# Patient Record
Sex: Female | Born: 1935 | Race: White | Hispanic: No | Smoking: Current every day smoker
Health system: Southern US, Community
[De-identification: ages and names within clinical notes are randomized; demographics above are authoritative.]

## PROBLEM LIST (undated history)

## (undated) ENCOUNTER — Ambulatory Visit: Admission: EM | Source: Home / Self Care

## (undated) DIAGNOSIS — H269 Unspecified cataract: Secondary | ICD-10-CM

## (undated) DIAGNOSIS — Z923 Personal history of irradiation: Secondary | ICD-10-CM

## (undated) DIAGNOSIS — C50919 Malignant neoplasm of unspecified site of unspecified female breast: Secondary | ICD-10-CM

## (undated) DIAGNOSIS — E7800 Pure hypercholesterolemia, unspecified: Secondary | ICD-10-CM

## (undated) DIAGNOSIS — C50912 Malignant neoplasm of unspecified site of left female breast: Secondary | ICD-10-CM

## (undated) DIAGNOSIS — Z8739 Personal history of other diseases of the musculoskeletal system and connective tissue: Secondary | ICD-10-CM

## (undated) DIAGNOSIS — I10 Essential (primary) hypertension: Secondary | ICD-10-CM

## (undated) DIAGNOSIS — I219 Acute myocardial infarction, unspecified: Secondary | ICD-10-CM

## (undated) DIAGNOSIS — D649 Anemia, unspecified: Secondary | ICD-10-CM

## (undated) DIAGNOSIS — K219 Gastro-esophageal reflux disease without esophagitis: Secondary | ICD-10-CM

## (undated) DIAGNOSIS — C50312 Malignant neoplasm of lower-inner quadrant of left female breast: Secondary | ICD-10-CM

## (undated) HISTORY — PX: ABDOMINAL HYSTERECTOMY: SHX81

## (undated) HISTORY — DX: Unspecified cataract: H26.9

## (undated) HISTORY — DX: Malignant neoplasm of lower-inner quadrant of left female breast: C50.312

## (undated) HISTORY — PX: CORONARY ANGIOPLASTY: SHX604

## (undated) HISTORY — PX: PORTACATH PLACEMENT: SHX2246

## (undated) HISTORY — PX: BREAST SURGERY: SHX581

## (undated) HISTORY — PX: PORT-A-CATH REMOVAL: SHX5289

## (undated) HISTORY — DX: Gastro-esophageal reflux disease without esophagitis: K21.9

## (undated) HISTORY — DX: Malignant neoplasm of unspecified site of unspecified female breast: C50.919

## (undated) HISTORY — DX: Malignant neoplasm of unspecified site of left female breast: C50.912

## (undated) HISTORY — PX: MASTECTOMY, PARTIAL: SHX709

## (undated) HISTORY — PX: PERCUTANEOUS STENT INTERVENTION: SHX6019

---

## 2000-06-01 ENCOUNTER — Ambulatory Visit (HOSPITAL_COMMUNITY): Admission: RE | Admit: 2000-06-01 | Discharge: 2000-06-01 | Payer: Self-pay | Admitting: Otolaryngology

## 2000-06-01 ENCOUNTER — Encounter: Payer: Self-pay | Admitting: Internal Medicine

## 2001-01-13 DIAGNOSIS — I219 Acute myocardial infarction, unspecified: Secondary | ICD-10-CM

## 2001-01-13 HISTORY — DX: Acute myocardial infarction, unspecified: I21.9

## 2001-02-11 ENCOUNTER — Ambulatory Visit (HOSPITAL_COMMUNITY): Admission: RE | Admit: 2001-02-11 | Discharge: 2001-02-11 | Payer: Self-pay | Admitting: Internal Medicine

## 2001-02-11 ENCOUNTER — Encounter: Payer: Self-pay | Admitting: Internal Medicine

## 2001-02-16 ENCOUNTER — Encounter: Payer: Self-pay | Admitting: Internal Medicine

## 2001-02-16 ENCOUNTER — Ambulatory Visit (HOSPITAL_COMMUNITY): Admission: RE | Admit: 2001-02-16 | Discharge: 2001-02-16 | Payer: Self-pay | Admitting: Internal Medicine

## 2001-02-24 ENCOUNTER — Ambulatory Visit (HOSPITAL_COMMUNITY): Admission: RE | Admit: 2001-02-24 | Discharge: 2001-02-24 | Payer: Self-pay | Admitting: Internal Medicine

## 2001-02-24 ENCOUNTER — Encounter: Payer: Self-pay | Admitting: Internal Medicine

## 2001-03-08 ENCOUNTER — Ambulatory Visit (HOSPITAL_COMMUNITY): Admission: RE | Admit: 2001-03-08 | Discharge: 2001-03-08 | Payer: Self-pay | Admitting: General Surgery

## 2001-03-19 ENCOUNTER — Observation Stay (HOSPITAL_COMMUNITY): Admission: RE | Admit: 2001-03-19 | Discharge: 2001-03-20 | Payer: Self-pay | Admitting: General Surgery

## 2001-03-31 ENCOUNTER — Encounter: Admission: RE | Admit: 2001-03-31 | Discharge: 2001-03-31 | Payer: Self-pay | Admitting: Oncology

## 2001-03-31 ENCOUNTER — Encounter (HOSPITAL_COMMUNITY): Admission: RE | Admit: 2001-03-31 | Discharge: 2001-04-30 | Payer: Self-pay | Admitting: Oncology

## 2001-04-05 ENCOUNTER — Ambulatory Visit (HOSPITAL_COMMUNITY): Admission: RE | Admit: 2001-04-05 | Discharge: 2001-04-05 | Payer: Self-pay | Admitting: General Surgery

## 2001-04-05 ENCOUNTER — Encounter: Payer: Self-pay | Admitting: General Surgery

## 2001-04-06 ENCOUNTER — Encounter: Payer: Self-pay | Admitting: General Surgery

## 2001-04-07 ENCOUNTER — Encounter (HOSPITAL_COMMUNITY): Payer: Self-pay | Admitting: Oncology

## 2001-05-04 ENCOUNTER — Encounter (HOSPITAL_COMMUNITY): Admission: RE | Admit: 2001-05-04 | Discharge: 2001-06-03 | Payer: Self-pay | Admitting: Oncology

## 2001-05-04 ENCOUNTER — Encounter: Admission: RE | Admit: 2001-05-04 | Discharge: 2001-05-04 | Payer: Self-pay | Admitting: Oncology

## 2001-06-14 ENCOUNTER — Encounter: Admission: RE | Admit: 2001-06-14 | Discharge: 2001-06-14 | Payer: Self-pay | Admitting: Oncology

## 2001-06-14 ENCOUNTER — Encounter (HOSPITAL_COMMUNITY): Admission: RE | Admit: 2001-06-14 | Discharge: 2001-07-14 | Payer: Self-pay | Admitting: Oncology

## 2001-06-15 ENCOUNTER — Ambulatory Visit: Admission: RE | Admit: 2001-06-15 | Discharge: 2001-09-13 | Payer: Self-pay | Admitting: *Deleted

## 2001-07-08 ENCOUNTER — Encounter (HOSPITAL_COMMUNITY): Payer: Self-pay | Admitting: Oncology

## 2001-07-13 ENCOUNTER — Ambulatory Visit (HOSPITAL_COMMUNITY): Admission: RE | Admit: 2001-07-13 | Discharge: 2001-07-13 | Payer: Self-pay | Admitting: General Surgery

## 2001-10-06 ENCOUNTER — Encounter (HOSPITAL_COMMUNITY): Admission: RE | Admit: 2001-10-06 | Discharge: 2001-11-05 | Payer: Self-pay | Admitting: Oncology

## 2001-10-06 ENCOUNTER — Encounter: Admission: RE | Admit: 2001-10-06 | Discharge: 2001-10-06 | Payer: Self-pay | Admitting: Oncology

## 2002-01-03 ENCOUNTER — Encounter (HOSPITAL_COMMUNITY): Admission: RE | Admit: 2002-01-03 | Discharge: 2002-02-02 | Payer: Self-pay | Admitting: Oncology

## 2002-01-03 ENCOUNTER — Encounter: Admission: RE | Admit: 2002-01-03 | Discharge: 2002-01-03 | Payer: Self-pay | Admitting: Oncology

## 2002-02-21 ENCOUNTER — Encounter: Admission: RE | Admit: 2002-02-21 | Discharge: 2002-02-21 | Payer: Self-pay | Admitting: Oncology

## 2002-02-21 ENCOUNTER — Encounter (HOSPITAL_COMMUNITY): Payer: Self-pay | Admitting: Oncology

## 2002-02-21 ENCOUNTER — Encounter (HOSPITAL_COMMUNITY): Admission: RE | Admit: 2002-02-21 | Discharge: 2002-03-23 | Payer: Self-pay | Admitting: Oncology

## 2002-04-01 ENCOUNTER — Encounter: Admission: RE | Admit: 2002-04-01 | Discharge: 2002-04-01 | Payer: Self-pay | Admitting: Oncology

## 2002-04-01 ENCOUNTER — Encounter (HOSPITAL_COMMUNITY): Admission: RE | Admit: 2002-04-01 | Discharge: 2002-05-01 | Payer: Self-pay | Admitting: Oncology

## 2002-04-04 ENCOUNTER — Encounter (HOSPITAL_COMMUNITY): Payer: Self-pay | Admitting: Oncology

## 2002-07-11 ENCOUNTER — Ambulatory Visit (HOSPITAL_COMMUNITY): Admission: RE | Admit: 2002-07-11 | Discharge: 2002-07-11 | Payer: Self-pay | Admitting: Oncology

## 2002-07-11 ENCOUNTER — Encounter (HOSPITAL_COMMUNITY): Payer: Self-pay | Admitting: Oncology

## 2002-10-05 ENCOUNTER — Encounter: Admission: RE | Admit: 2002-10-05 | Discharge: 2002-10-05 | Payer: Self-pay | Admitting: Oncology

## 2002-10-05 ENCOUNTER — Encounter (HOSPITAL_COMMUNITY): Admission: RE | Admit: 2002-10-05 | Discharge: 2002-10-13 | Payer: Self-pay | Admitting: Oncology

## 2003-03-01 ENCOUNTER — Encounter: Admission: RE | Admit: 2003-03-01 | Discharge: 2003-03-01 | Payer: Self-pay | Admitting: Oncology

## 2003-03-01 ENCOUNTER — Encounter (HOSPITAL_COMMUNITY): Admission: RE | Admit: 2003-03-01 | Discharge: 2003-03-31 | Payer: Self-pay | Admitting: Oncology

## 2003-04-04 ENCOUNTER — Encounter: Admission: RE | Admit: 2003-04-04 | Discharge: 2003-04-04 | Payer: Self-pay | Admitting: Oncology

## 2003-04-04 ENCOUNTER — Encounter (HOSPITAL_COMMUNITY): Admission: RE | Admit: 2003-04-04 | Discharge: 2003-05-04 | Payer: Self-pay | Admitting: Oncology

## 2003-10-06 ENCOUNTER — Encounter (HOSPITAL_COMMUNITY): Admission: RE | Admit: 2003-10-06 | Discharge: 2003-10-13 | Payer: Self-pay | Admitting: Oncology

## 2003-10-06 ENCOUNTER — Encounter: Admission: RE | Admit: 2003-10-06 | Discharge: 2003-10-13 | Payer: Self-pay | Admitting: Oncology

## 2004-04-03 ENCOUNTER — Ambulatory Visit (HOSPITAL_COMMUNITY): Payer: Self-pay | Admitting: Oncology

## 2004-04-03 ENCOUNTER — Encounter: Admission: RE | Admit: 2004-04-03 | Discharge: 2004-04-03 | Payer: Self-pay | Admitting: Oncology

## 2004-04-03 ENCOUNTER — Encounter (HOSPITAL_COMMUNITY): Admission: RE | Admit: 2004-04-03 | Discharge: 2004-05-03 | Payer: Self-pay | Admitting: Oncology

## 2004-07-30 ENCOUNTER — Ambulatory Visit (HOSPITAL_COMMUNITY): Admission: RE | Admit: 2004-07-30 | Discharge: 2004-07-30 | Payer: Self-pay | Admitting: Oncology

## 2004-10-02 ENCOUNTER — Encounter: Admission: RE | Admit: 2004-10-02 | Discharge: 2004-10-11 | Payer: Self-pay | Admitting: Oncology

## 2004-10-02 ENCOUNTER — Encounter (HOSPITAL_COMMUNITY): Admission: RE | Admit: 2004-10-02 | Discharge: 2004-10-11 | Payer: Self-pay | Admitting: Oncology

## 2004-10-02 ENCOUNTER — Ambulatory Visit (HOSPITAL_COMMUNITY): Payer: Self-pay | Admitting: Oncology

## 2005-04-02 ENCOUNTER — Encounter: Admission: RE | Admit: 2005-04-02 | Discharge: 2005-04-02 | Payer: Self-pay | Admitting: Oncology

## 2005-04-02 ENCOUNTER — Ambulatory Visit (HOSPITAL_COMMUNITY): Payer: Self-pay | Admitting: Oncology

## 2005-04-02 ENCOUNTER — Encounter (HOSPITAL_COMMUNITY): Admission: RE | Admit: 2005-04-02 | Discharge: 2005-05-02 | Payer: Self-pay | Admitting: Oncology

## 2005-10-01 ENCOUNTER — Ambulatory Visit (HOSPITAL_COMMUNITY): Payer: Self-pay | Admitting: Oncology

## 2005-10-01 ENCOUNTER — Encounter (HOSPITAL_COMMUNITY): Admission: RE | Admit: 2005-10-01 | Discharge: 2005-10-10 | Payer: Self-pay | Admitting: Oncology

## 2005-10-01 ENCOUNTER — Encounter: Admission: RE | Admit: 2005-10-01 | Discharge: 2005-10-10 | Payer: Self-pay | Admitting: Oncology

## 2005-11-05 ENCOUNTER — Ambulatory Visit (HOSPITAL_COMMUNITY): Admission: RE | Admit: 2005-11-05 | Discharge: 2005-11-05 | Payer: Self-pay | Admitting: Family Medicine

## 2006-04-01 ENCOUNTER — Encounter (HOSPITAL_COMMUNITY): Admission: RE | Admit: 2006-04-01 | Discharge: 2006-05-01 | Payer: Self-pay | Admitting: Oncology

## 2006-04-01 ENCOUNTER — Ambulatory Visit (HOSPITAL_COMMUNITY): Payer: Self-pay | Admitting: Oncology

## 2006-05-13 ENCOUNTER — Encounter (HOSPITAL_COMMUNITY): Admission: RE | Admit: 2006-05-13 | Discharge: 2006-06-12 | Payer: Self-pay | Admitting: Oncology

## 2006-09-05 ENCOUNTER — Emergency Department (HOSPITAL_COMMUNITY): Admission: EM | Admit: 2006-09-05 | Discharge: 2006-09-05 | Payer: Self-pay | Admitting: Emergency Medicine

## 2007-04-28 ENCOUNTER — Ambulatory Visit (HOSPITAL_COMMUNITY): Payer: Self-pay | Admitting: Oncology

## 2007-04-28 ENCOUNTER — Encounter (HOSPITAL_COMMUNITY): Admission: RE | Admit: 2007-04-28 | Discharge: 2007-05-28 | Payer: Self-pay | Admitting: Oncology

## 2007-08-30 ENCOUNTER — Encounter (HOSPITAL_COMMUNITY): Admission: RE | Admit: 2007-08-30 | Discharge: 2007-09-29 | Payer: Self-pay | Admitting: Oncology

## 2008-04-21 ENCOUNTER — Encounter (HOSPITAL_COMMUNITY): Admission: RE | Admit: 2008-04-21 | Discharge: 2008-05-21 | Payer: Self-pay | Admitting: Oncology

## 2008-04-21 ENCOUNTER — Ambulatory Visit (HOSPITAL_COMMUNITY): Payer: Self-pay | Admitting: Oncology

## 2008-05-22 ENCOUNTER — Encounter: Payer: Self-pay | Admitting: Gastroenterology

## 2008-06-14 ENCOUNTER — Encounter: Payer: Self-pay | Admitting: Gastroenterology

## 2008-06-14 ENCOUNTER — Ambulatory Visit: Payer: Self-pay | Admitting: Gastroenterology

## 2008-06-14 ENCOUNTER — Ambulatory Visit (HOSPITAL_COMMUNITY): Admission: RE | Admit: 2008-06-14 | Discharge: 2008-06-14 | Payer: Self-pay | Admitting: Gastroenterology

## 2008-06-15 ENCOUNTER — Encounter: Payer: Self-pay | Admitting: Gastroenterology

## 2008-06-15 ENCOUNTER — Telehealth (INDEPENDENT_AMBULATORY_CARE_PROVIDER_SITE_OTHER): Payer: Self-pay

## 2008-06-16 ENCOUNTER — Ambulatory Visit (HOSPITAL_COMMUNITY): Payer: Self-pay | Admitting: Oncology

## 2008-06-19 ENCOUNTER — Telehealth (INDEPENDENT_AMBULATORY_CARE_PROVIDER_SITE_OTHER): Payer: Self-pay

## 2008-06-20 ENCOUNTER — Encounter (INDEPENDENT_AMBULATORY_CARE_PROVIDER_SITE_OTHER): Payer: Self-pay

## 2008-06-30 ENCOUNTER — Encounter (HOSPITAL_COMMUNITY): Admission: RE | Admit: 2008-06-30 | Discharge: 2008-07-30 | Payer: Self-pay | Admitting: Oncology

## 2008-10-05 ENCOUNTER — Ambulatory Visit (HOSPITAL_COMMUNITY): Admission: RE | Admit: 2008-10-05 | Discharge: 2008-10-05 | Payer: Self-pay | Admitting: Oncology

## 2009-04-25 ENCOUNTER — Encounter (HOSPITAL_COMMUNITY): Admission: RE | Admit: 2009-04-25 | Discharge: 2009-05-25 | Payer: Self-pay | Admitting: Oncology

## 2009-04-25 ENCOUNTER — Ambulatory Visit (HOSPITAL_COMMUNITY): Payer: Self-pay | Admitting: Oncology

## 2009-10-03 ENCOUNTER — Emergency Department (HOSPITAL_COMMUNITY): Admission: EM | Admit: 2009-10-03 | Discharge: 2009-10-03 | Payer: Self-pay | Admitting: Emergency Medicine

## 2009-10-18 ENCOUNTER — Ambulatory Visit (HOSPITAL_COMMUNITY): Admission: RE | Admit: 2009-10-18 | Discharge: 2009-10-18 | Payer: Self-pay | Admitting: Oncology

## 2010-02-03 ENCOUNTER — Encounter (HOSPITAL_COMMUNITY): Payer: Self-pay | Admitting: Oncology

## 2010-02-03 ENCOUNTER — Encounter: Payer: Self-pay | Admitting: Internal Medicine

## 2010-04-03 LAB — CBC
HCT: 40.1 % (ref 36.0–46.0)
Hemoglobin: 13.8 g/dL (ref 12.0–15.0)
MCHC: 34.4 g/dL (ref 30.0–36.0)
MCV: 92 fL (ref 78.0–100.0)
Platelets: 280 10*3/uL (ref 150–400)
WBC: 6.9 10*3/uL (ref 4.0–10.5)

## 2010-04-03 LAB — COMPREHENSIVE METABOLIC PANEL
Alkaline Phosphatase: 83 U/L (ref 39–117)
CO2: 27 mEq/L (ref 19–32)
Chloride: 102 mEq/L (ref 96–112)
Creatinine, Ser: 0.78 mg/dL (ref 0.4–1.2)
GFR calc Af Amer: >60 mL/min (ref >60)
Glucose, Bld: 98 mg/dL (ref 70–99)
Potassium: 3.8 mEq/L (ref 3.5–5.1)
Total Protein: 7 g/dL (ref 6.0–8.3)

## 2010-04-03 LAB — DIFFERENTIAL
Lymphocytes Relative: 33 % (ref 12–46)
Monocytes Absolute: 0.5 10*3/uL (ref 0.1–1.0)
Monocytes Relative: 7 % (ref 3–12)
Neutro Abs: 3.8 10*3/uL (ref 1.7–7.7)

## 2010-04-22 LAB — POTASSIUM: Potassium: 3.5 mEq/L (ref 3.5–5.1)

## 2010-04-24 ENCOUNTER — Encounter (HOSPITAL_COMMUNITY): Payer: Medicare Other | Attending: Oncology | Admitting: Oncology

## 2010-04-24 DIAGNOSIS — Z853 Personal history of malignant neoplasm of breast: Secondary | ICD-10-CM | POA: Insufficient documentation

## 2010-04-24 DIAGNOSIS — Z09 Encounter for follow-up examination after completed treatment for conditions other than malignant neoplasm: Secondary | ICD-10-CM | POA: Insufficient documentation

## 2010-04-24 DIAGNOSIS — C50919 Malignant neoplasm of unspecified site of unspecified female breast: Secondary | ICD-10-CM

## 2010-04-24 DIAGNOSIS — J449 Chronic obstructive pulmonary disease, unspecified: Secondary | ICD-10-CM | POA: Insufficient documentation

## 2010-04-24 DIAGNOSIS — J4489 Other specified chronic obstructive pulmonary disease: Secondary | ICD-10-CM | POA: Insufficient documentation

## 2010-04-24 DIAGNOSIS — F341 Dysthymic disorder: Secondary | ICD-10-CM | POA: Insufficient documentation

## 2010-04-24 DIAGNOSIS — F17200 Nicotine dependence, unspecified, uncomplicated: Secondary | ICD-10-CM | POA: Insufficient documentation

## 2010-04-24 LAB — DIFFERENTIAL
Basophils Relative: 1 % (ref 0–1)
Eosinophils Absolute: 0.2 10*3/uL (ref 0.0–0.7)
Lymphocytes Relative: 34 % (ref 12–46)
Lymphs Abs: 2.8 10*3/uL (ref 0.7–4.0)
Neutro Abs: 4.6 10*3/uL (ref 1.7–7.7)
Neutrophils Relative %: 56 % (ref 43–77)

## 2010-04-24 LAB — CBC
HCT: 42.6 % (ref 36.0–46.0)
Hemoglobin: 14.6 g/dL (ref 12.0–15.0)
MCHC: 34.2 g/dL (ref 30.0–36.0)
Platelets: 283 10*3/uL (ref 150–400)
RDW: 12.9 % (ref 11.5–15.5)
WBC: 8.2 10*3/uL (ref 4.0–10.5)

## 2010-04-24 LAB — COMPREHENSIVE METABOLIC PANEL
CO2: 27 mEq/L (ref 19–32)
Calcium: 9.6 mg/dL (ref 8.4–10.5)
GFR calc non Af Amer: >60 mL/min (ref >60)
Potassium: 3.3 mEq/L — ABNORMAL LOW (ref 3.5–5.1)
Sodium: 141 mEq/L (ref 135–145)
Total Bilirubin: 0.5 mg/dL (ref 0.3–1.2)

## 2010-05-28 NOTE — Op Note (Signed)
NAMEJAKALYN, Margaret Thompson                  ACCOUNT NO.:  [id number]   MEDICAL RECORD NO.:  [id number]          PATIENT TYPE:  AMB   LOCATION:  DAY                           FACILITY:  [facility]   PHYSICIAN:  Kassie Mends, M.D.      DATE OF BIRTH:  [birth year]   DATE OF PROCEDURE:  06/14/2008  DATE OF DISCHARGE:                               OPERATIVE REPORT   REFERRING PHYSICIAN:  Ladona Horns. Neijstrom, MD   PROCEDURE:  Colonoscopy with cold forceps and snare cautery polypectomy.   INDICATION FOR EXAM:  Margaret Thompson is a 77 year old female who presented  with a history of breast cancer.  She has never had a colonoscopy.   FINDINGS:  1. Extremely angulated and fixed rectosigmoid junction which required      change from an adult colonoscope to a pediatric colonoscope.  The      majority of the procedure was done with the patient in the supine      position.  The pediatric colonoscope was able to be advanced beyond      the angulated area in the rectosigmoid junction.  2. Pan colonic diverticulosis most pronounced in the descending and      sigmoid colon.  3. Two cecal AVMs.  One serpiginous sessile ascending colon polyp      removed via snare cautery.  One descending colon polyp removed via      snare cautery.  One 4 mm sessile sigmoid colon polyp removed via      cold forceps.  Otherwise no masses or inflammatory changes seen.  4. Multiple hyperplastic appearing polyps which disappeared with      insufflation.  Biopsies obtained via cold forceps.  Large internal      hemorrhoids.  One 8 mm sessile rectal polyp located approximately      18 cm from the anal verge removed via snare cautery.  5. Melanosis coli.   DIAGNOSES:  1. Multiple colon polyps ranging in size from 4 mm to 8 mm.  Two cecal      AVMs (arteriovenous malformations), no evidence of active bleeding.  2. Pancolonic diverticulosis most pronounced in the left colon.  3. Large internal hemorrhoids.   RECOMMENDATIONS:  1. No aspirin,  NSAIDs or anticoagulation for 7 days.  She may restart      her Advil on June 10.  2. Will add Anusol HC twice daily for 14 days.  3. Will call with the results of her biopsies.  4. She should follow a high-fiber diet.  She is given a handout on      high-fiber diet, constipation, polyps, diverticulosis and      hemorrhoids.  5. Will need an appointment to discuss the benefits versus risks of      screening colonoscopy in 10 years if she has evidence of simple      adenomas or hyperplastic polyps.   MEDICATIONS:  1. Demerol 50 mg IV.  2. Versed 5 mg IV.   PROCEDURE TECHNIQUE:  Physical exam was performed.  Informed consent was  obtained from the patient after explaining  the benefits, risks and  alternatives to the procedure.  The patient was connected to the monitor  and placed in left lateral position.  Continuous oxygen was provided by  nasal cannula, IV medicine administered via an indwelling cannula.  After administration of sedation and rectal exam, the patient's rectum  was intubated and the scope was advanced under direct visualization to  the rectosigmoid junction.  The adult colonoscope would not pass without  significant discomfort to the patient.  The scope was withdrawn.  It was  exchanged for a pediatric colonoscope.  The pediatric colonoscope was  advanced under direct visualization to the rectosigmoid junction.  It  required changes in the patient's position in order to advance beyond  this point.  The colonoscope was then advanced under direct  visualization to the cecum.  The scope was removed slowly by carefully  examining the color, texture, anatomy and integrity of the mucosa on the  way out.  The patient was recovered in endoscopy.  After her procedure  was complete she developed one episode of vomiting and complained of  nausea.  She was given Zofran 4 mg IV.   PATH:  Simple adenoma and hyperplastic polyp.      Kassie Mends, M.D.  Electronically  Signed     SM/MEDQ  D:  06/14/2008  T:  06/14/2008  Job:  [id number]   cc:   Ladona Horns. Mariel Sleet, MD  Fax: [phone]

## 2010-05-31 NOTE — Op Note (Signed)
[place]  Patient:    Margaret Thompson, Margaret Thompson Visit Number: [id number] MRN: [id number]          Service Type: DSU Location: DAY Attending Physician:  Dalia Heading Dictated by:   Franky Macho, M.D. Proc. Date: 04/05/01 Admit Date:  04/05/2001   CC:         Franky Macho, M.D.   Operative Report  PREOPERATIVE DIAGNOSIS:  Left breast carcinoma.  POSTOPERATIVE DIAGNOSIS:  Left breast carcinoma.  PROCEDURE:  Porta cath insertion.  SURGEON:  Franky Macho, M.D.  ANESTHESIA:  General.  INDICATIONS:  The patient is a 77 year-old white female who is about to undergo chemotherapy for left breast carcinoma.  The risks and benefits of the procedure including bleeding, infection, and pneumothorax were fully explained to the patient.  I gained informed consent.  PROCEDURE:  The patient was placed in the Trendelenburg position after the right upper chest was prepped and draped using the usual sterile technique with Betadine.  Xylocaine 1% was instilled into the surrounding region.  A transverse incision was made below the right clavicle.  This was taken down to the subcutaneous tissue and a subcutaneous pocket was formed.  Needle was advanced into the right subclavian vein and a guidewire was then advanced into the superior vena cava under fluoroscopic guidance without difficulty. Introducer and peel-away sheath were then placed over the guidewire and the catheter was inserted into the superior vena cava and the peel-away sheath was removed. The catheter was then attached to the port and the port was placed in the subcutaneous pocket.  Confirmation placement was done using fluoroscopy. Heparin 3,000 units was then instilled into the port.  The subcutaneous tissue was reapproximated using 4-0 Vicryl interrupted suture.  The skin was closed using a 4-0 Vicryl subcuticular suture.  Steri-strips and a dry sterile dressing were applied. All needle counts were  correct at the end of the procedure.  The patient was transferred back to the PACU for a chest x-ray.  COMPLICATIONS:  None.  SPECIMENS:  None.  BLOOD LOSS:  Minimal. Dictated by:   Franky Macho, M.D. Attending Physician:  Dalia Heading DD:  04/05/01 TD:  04/06/01 Job: [id number]

## 2010-05-31 NOTE — H&P (Signed)
[facility]  Patient:    Margaret Thompson, Margaret Thompson Visit Number: [id number] MRN: [id number]          Service Type: OUT Location: RAD Attending Physician:  Cassell Smiles. Dictated by:   Franky Macho, M.D. Admit Date:  02/24/2001 Discharge Date: 02/24/2001   CC:         Margaret Thompson, M.D.   History and Physical  AGE:  77 years old.  CHIEF COMPLAINT:  Left breast mass.  HISTORY OF PRESENT ILLNESS:  The patient is a 77 year old white female, who is referred for evaluation and treatment of a left breast mass.  She does not feel it.  No nipple discharge or family history of breast carcinoma is noted. She was first pregnant at age 65, had four children, never breast fed.  She does have a history of mastitis in the remote past.  She had a hysterectomy in 1977.  She still has her ovaries.  PAST MEDICAL HISTORY:  Unremarkable.  PAST SURGICAL HISTORY:  Hysterectomy.  CURRENT MEDICATIONS:  None.  ALLERGIES:  Z-PAK.  REVIEW OF SYSTEMS:  The patient does smoke a pack of cigarettes a day.  She denies any alcohol use.  She denies any other cardiopulmonary difficulties or bleeding disorders.  PHYSICAL EXAMINATION:  GENERAL:  The patient is a well-developed, well-nourished white female, in no acute distress.  VITAL SIGNS:  She is afebrile and vital signs are stable.  NECK:  Supple without lymphadenopathy.  LUNGS:  Clear to auscultation, with equal breath sounds bilaterally.  HEART:  Regular rate and rhythm without S3, S4, or murmurs.  BREAST:  Right breast examination is unremarkable.  Left breast examination reveals a 2 cm mobile, dominant mass at the two oclock position laterally. No nipple discharge or dimpling is noted.  The axilla is negative for palpable nodes.  LABORATORY DATA:  Mammography and ultrasound reveal a solid mass in the upper outer quadrant of the left breast.  IMPRESSION:  Left breast mass.  PLAN:  The patient is  scheduled for left breast biopsy on March 08, 2001. The risks and benefits of the procedure, including bleeding and infection, were fully explained to the patient, who gave informed consent. Dictated by:   Franky Macho, M.D. Attending Physician:  Cassell Smiles DD:  03/04/01 TD:  03/05/01 Job: [id number]

## 2010-05-31 NOTE — Op Note (Signed)
[facility]  Patient:    Margaret Thompson, Margaret Thompson Visit Number: [id number] MRN: [id number]          Service Type: OBV Location: 3A A305 01 Attending Physician:  Dalia Heading Dictated by:   Franky Macho, M.D. Proc. Date: 03/19/01 Admit Date:  03/19/2001 Discharge Date: 03/20/2001   CC:         [name], M.D.   Operative Report  PATIENT AGE:  77 years old  PREOPERATIVE DIAGNOSIS:  Left breast carcinoma.  POSTOPERATIVE DIAGNOSIS:  Left breast carcinoma.  OPERATION:  Left partial mastectomy with axillary dissection.  SURGEON:  Franky Macho, M.D.  ANESTHESIA:  General endotracheal anesthesia.  INDICATIONS:  The patient is a 77 year old white female who underwent left breast biopsy recently and was found to have infiltrating ductal carcinoma with involvement of the margins.  She now comes to the operating room for left partial mastectomy with axillary dissection.  The risks and benefits of the procedure including bleeding, infection, nerve injury, the possibility of left arm swelling were fully explained to the patient who gave informed consent.  DESCRIPTION OF PROCEDURE:  The patient was placed in the supine position.  The left breast and axilla were prepped and draped using the usual sterile technique with Betadine.  The patient had a previous left breast biopsy in the upper, outer quadrant. This was again incised, and a larger segment of tissue was taken down to the muscle and up in to the axilla around the previous biopsy pocket.  This was sent to pathology for further examination.  Any bleeding was controlled using Bovie electrocautery.  The wound was irrigated with normal saline.  The skin was closed using 4-0 nylon vertical mattress sutures.  Next, a left axillary dissection was performed.  An incision was made into the left axilla.  This was taken down to the subcutaneous tissue.  A level 2 dissection was performed.  Multiple lymph nodes  were noted to be swollen, suspicious for malignancy.  The long thoracic nerve as well as the thoracodorsal artery and nerves were identified and noted to be intact.  The intercostal brachial nerve was also identified and left intact.  The axilla was irrigated with normal saline.  A #10 flat Jackson-Pratt drain was placed into the wound and brought out through a separate stab wound inferior to the incision line.  This was secured at the skin level using a 3-0 nylon interrupted suture.  The subcutaneous layer was reapproximated using a 3-0 Vicryl interrupted suture.  The skin was closed using a 4-0 Vicryl subcuticular suture.  Steri-Strips and a dry sterile dressing were applied.  All tape and needle counts were correct at the end of the procedure.  The patient was extubated in the operating room and went back to the recovery room awake and in stable condition.  COMPLICATIONS:  None.  SPECIMEN:   Left breast tissue, left axillary tissue.  ESTIMATED BLOOD LOSS:  50 cc.  DRAINS:  Jackson-Pratt drain to left axilla. Dictated by:   Franky Macho, M.D. Attending Physician:  Dalia Heading DD:  03/19/01 TD:  03/20/01 Job: [id number]

## 2010-05-31 NOTE — H&P (Signed)
[facility]  Patient:    Margaret Thompson, Margaret Thompson Visit Number: [id number] MRN: [id number]          Service Type: REC Location: SPCL Attending Physician:  Elisabeth Most Dictated by:   Franky Macho, M.D. Admit Date:  03/31/2001   CC:         Ladona Horns. Mariel Sleet, M.D.  [name], M.D.   History and Physical  AGE:  77 years old.  CHIEF COMPLAINT:  Left breast carcinoma.  HISTORY OF PRESENT ILLNESS:  The patient is a 77 year old white female, who recently was noted to have left breast carcinoma and now presents for Port-A-Cath insertion for chemotherapy.  She had a left partial mastectomy with axillary dissection on March 19, 2001.  PAST MEDICAL HISTORY:  As noted above.  PAST SURGICAL HISTORY:  1. As noted above.  2. Hysterectomy.  CURRENT MEDICATIONS:  Darvocet-N 100 as needed for pain.  ALLERGIES:  ZITHROMAX DOSPAK.  SOCIAL HISTORY:  The patient does smoke a pack of cigarettes a day.  She denies any alcohol use.  REVIEW OF SYSTEMS:  She denies any other cardiopulmonary difficulties or bleeding disorders.  PHYSICAL EXAMINATION:  GENERAL:  The patient is a well-developed, well-nourished white female in no acute distress.  VITAL SIGNS:  She is afebrile and vital signs are stable.  LUNGS:  Clear to auscultation without equal breath sounds bilaterally.  HEART:  Regular rate and rhythm without S3, S4, or murmurs.  IMPRESSION:  Left breast carcinoma, need for chemotherapy.  PLAN:  The patient is scheduled to undergo Port-A-Cath insertion on April 05, 2001.  The risks and benefits of the procedure including bleeding and pneumothorax were fully explained to the patient, who gave informed consent. Dictated by:   Franky Macho, M.D. Attending Physician:  Elisabeth Most DD:  04/01/01 TD:  04/03/01 Job: [id number]

## 2010-05-31 NOTE — Op Note (Signed)
[facility]  Patient:    TOPANGA, ALVELO Visit Number: [id number] MRN: [id number]          Service Type: DSU Location: DAY Attending Physician:  Dalia Heading Dictated by:   Franky Macho, M.D. Proc. Date: 03/08/01 Admit Date:  03/08/2001   CC:         [name], M.D.   Operative Report  AGE:  77 years old.  PREOPERATIVE DIAGNOSIS:  Left breast mass.  POSTOPERATIVE DIAGNOSIS:  Left breast mass.  PROCEDURE:  Left breast biopsy.  SURGEON:  Franky Macho, M.D.  ANESTHESIA:  MAC.  INDICATIONS:  Patient is a 77 year old white female who is referred for evaluation and treatment of a left breast mass.  The risks and benefits of the procedure including bleeding and infection were fully explained to the patient, who gave informed consent.  DESCRIPTION OF PROCEDURE:  Patient was placed in a supine position.  The left breast was prepped and draped using the usual sterile technique with Betadine. Xylocaine 1% was used for local anesthesia.  A curvilinear incision was made at the 2 oclock position in the left breast. This was taken down to the mass which was somewhat adhesed to the pectoralis major muscle.  A rim of normal breast tissue was excised around the mass.  The mass measured approximately 1.5 cm in size.  The specimen was removed and sent to pathology for further examination.  Any bleeding was controlled using Bovie electrocautery.  The skin was closed using a 4-0 Vicryl subcuticular suture. Steri-Strips and a dry sterile dressing were applied.  All tape and needle counts were correct at the end of the procedure.  The patient was awakened and transferred to day surgery in stable condition.  COMPLICATIONS:  None.  SPECIMEN:  Left breast mass.   BLOOD LOSS:  Minimal.Dictated by:   Franky Macho, M.D. Attending Physician:  Dalia Heading DD:  03/08/01 TD:  03/08/01 Job: [id number]

## 2010-05-31 NOTE — Op Note (Signed)
[facility]  Patient:    Margaret Thompson, Margaret Thompson Visit Number: [id number] MRN: [id number]          Service Type: DSU Location: DAY Attending Physician:  Dalia Heading Dictated by:   Franky Macho, M.D. Proc. Date: 07/13/01 Admit Date:  07/13/2001                             Operative Report  [age] years old.  PREOPERATIVE DIAGNOSES:  Finished with chemotherapy, left breast carcinoma.  POSTOPERATIVE DIAGNOSES:  Finished with chemotherapy, left breast carcinoma.  PROCEDURE:  Removal of Port-A-Cath.  SURGEON:  Franky Macho, M.D.  ANESTHESIA:  Local, 1% Xylocaine.  INDICATIONS:  The patient is a 77 year old white female who presents with Port-A-Cath removal.  She is finished with chemotherapy and is now undergoing radiation therapy.  The risks and benefits of the procedure were fully explained to the patient, who gave informed consent.  DESCRIPTION OF PROCEDURE:  The patient was placed in supine position.  The right upper chest was prepped and draped using the usual sterile technique with Betadine.  Xylocaine 1% was used for local anesthesia.  An incision was made through the previous Port-A-Cath site scar.  This was taken down to the port and the port was removed in total without difficulty.  No abnormal bleeding was noted.  The skin was closed using a 4-0 Vicryl subcuticular sutures.  Steri-Strips and dry sterile dressing were applied.  All tape and needle counts were correct at the end of the procedure.  The patient was transferred back to day surgery in stable condition.  COMPLICATIONS:  None.  SPECIMEN:  None.  BLOOD LOSS:  Minimal. Dictated by:   Franky Macho, M.D. Attending Physician:  Dalia Heading DD:  07/13/01 TD:  07/14/01 Job: [id number]

## 2010-05-31 NOTE — H&P (Signed)
[facility]  Patient:    Margaret Thompson, Margaret Thompson Visit Number: [id number] MRN: [id number]          Service Type: DSU Location: DAY Attending Physician:  Dalia Heading Dictated by:   Franky Macho, M.D. Admit Date:  03/08/2001 Discharge Date: 03/08/2001   CC:         [name], M.D.   History and Physical  AGE:  77 years.  CHIEF COMPLAINT:  Left breast carcinoma.  HISTORY OF PRESENT ILLNESS:  The patient is a 77 year old white female who recently underwent a breast biopsy and was found to have invasive ductal carcinoma. The margins were not clear. After extensive discussion with the patient, she elected to proceed with a left partial mastectomy with axillary dissection.  PAST MEDICAL HISTORY:  Unremarkable.  PAST SURGICAL HISTORY:  As noted above, hysterectomy.  CURRENT MEDICATIONS:  Darvocet as needed for pain.  ALLERGIES:  Z-PACK.  REVIEW OF SYSTEMS:  The patient smokes a pack of cigarettes a day. She denies any alcohol use. She denies any other cardiopulmonary difficulties or bleeding disorders.  PHYSICAL EXAMINATION:  GENERAL:  The patient is a well-developed, well-nourished white female in no acute distress. She is afebrile.  VITAL SIGNS:  Stable.  NECK:  Supple without lymphadenopathy.  LUNGS:  Clear to auscultation with equal breath sounds bilaterally.  HEART:  Regular rate and rhythm without S3, S4, murmurs.  BREAST:  Right breast examination is unremarkable. Left breast examination reveals a healing left breast. Surgical scar in the upper, outer quadrant of the left breast. Resolving ecchymosis is noted. The axilla is negative for palpable nodes.  IMPRESSION:  Left breast carcinoma.  PLAN:  The patient is scheduled to undergo a left partial mastectomy with axillary dissection on March 19, 2001. The risks and benefits of the procedure including bleeding, infection, nerve injury, the possibility of left arm swelling were  fully explained to the patient, obtained informed consent. Dictated by:   Franky Macho, M.D. Attending Physician:  Dalia Heading DD:  03/17/01 TD:  03/17/01 Job: [id number]

## 2010-09-15 ENCOUNTER — Encounter: Payer: Self-pay | Admitting: *Deleted

## 2010-09-15 ENCOUNTER — Emergency Department (HOSPITAL_COMMUNITY)
Admission: EM | Admit: 2010-09-15 | Discharge: 2010-09-15 | Disposition: A | Discharge status: Home / Self Care | Payer: Medicare Other | Attending: Emergency Medicine | Admitting: Emergency Medicine

## 2010-09-15 ENCOUNTER — Emergency Department (HOSPITAL_COMMUNITY): Payer: Medicare Other

## 2010-09-15 DIAGNOSIS — J069 Acute upper respiratory infection, unspecified: Secondary | ICD-10-CM | POA: Insufficient documentation

## 2010-09-15 DIAGNOSIS — J40 Bronchitis, not specified as acute or chronic: Secondary | ICD-10-CM | POA: Insufficient documentation

## 2010-09-15 MED ORDER — IPRATROPIUM BROMIDE 0.02 % IN SOLN
0.5000 mg | Freq: Once | RESPIRATORY_TRACT | Status: DC
  Filled 2010-09-15: qty 2.5

## 2010-09-15 MED ORDER — ALBUTEROL SULFATE HFA 108 (90 BASE) MCG/ACT IN AERS
INHALATION_SPRAY | RESPIRATORY_TRACT | Status: AC
  Administered 2010-09-15: 18:00:00
  Filled 2010-09-15: qty 6.7

## 2010-09-15 MED ORDER — DOXYCYCLINE HYCLATE 100 MG PO TABS: 100.0000 mg | ORAL_TABLET | Freq: Two times a day (BID) | ORAL | Status: AC

## 2010-09-15 MED ORDER — ALBUTEROL SULFATE (5 MG/ML) 0.5% IN NEBU
5.0000 mg | INHALATION_SOLUTION | Freq: Once | RESPIRATORY_TRACT | Status: DC
  Filled 2010-09-15: qty 1

## 2010-09-15 NOTE — ED Provider Notes (Signed)
History     CSN: [id number] Arrival date & time: 09/15/2010  4:06 PM  Chief Complaint  Patient presents with  . Cough   HPI Pt was seen at 1650.  Per pt, c/o gradual onset and persistence of intermittent episodes of sneezing, cough with "yellow sputum," runny/stuffy nose and sinus congestion x4 days.  Pt states she couldn't get in to see her PMD in the office, so she came to the ED "to make sure I don't have pneumonia."  Denies fevers,no sore throat, no CP/palpitations, no SOB, no abd pain, no back pain, no N/V/D, no rash.   Past Medical History  Diagnosis Date  . Cancer     Past Surgical History  Procedure Date  . Breast surgery   . Abdominal hysterectomy   . Mastectomy, partial     History reviewed. No pertinent family history.  History  Substance Use Topics  . Smoking status: Current Everyday Smoker    Types: Cigarettes  . Smokeless tobacco: Not on file  . Alcohol Use: No    Review of Systems ROS: Statement: All systems negative except as marked or noted in the HPI; Constitutional: Negative for fever and chills. ; ; Eyes: Negative for eye pain, redness and discharge. ; ; ENMT: +runny/stuffy nose, sneezing, sinus congestion.  Negative for ear pain, hoarseness, and sore throat. ; ; Cardiovascular: Negative for chest pain, palpitations, diaphoresis, dyspnea and peripheral edema. ; ; Respiratory: +cough.  Negative for wheezing and stridor. ; ; Gastrointestinal: Negative for nausea, vomiting, diarrhea and abdominal pain, blood in stool, hematemesis, jaundice and rectal bleeding. . ; ; Genitourinary: Negative for dysuria, flank pain and hematuria. ; ; Musculoskeletal: Negative for back pain and neck pain. Negative for swelling and trauma.; ; Skin: Negative for pruritus, rash, abrasions, blisters, bruising and skin lesion.; ; Neuro: Negative for headache, lightheadedness and neck stiffness. Negative for weakness, altered level of consciousness , altered mental status, extremity weakness,  paresthesias, involuntary movement, seizure and syncope.     Physical Exam  BP 149/72  Pulse 94  Temp(Src) 98.4 F (36.9 C) (Oral)  Resp 22  Ht 5\' 2"  (1.575 m)  Wt 122 lb (55.339 kg)  BMI 22.31 kg/m2  SpO2 98%  Physical Exam 1655: Physical examination:  Nursing notes reviewed; Vital signs and O2 SAT reviewed;  Constitutional: Well developed, Well nourished, Well hydrated, In no acute distress; Head:  Normocephalic, atraumatic; Eyes: EOMI, PERRL, No scleral icterus; ENMT: TM's clear bilat.  +edemetous nasal turbinates bilat with clear rhinorrhea.  Mouth and pharynx normal, Mucous membranes moist; Neck: Supple, Full range of motion, No lymphadenopathy; Cardiovascular: Regular rate and rhythm, No murmur, rub, or gallop; Respiratory: Breath sounds coarse bilat, No wheezes or stridor, Normal respiratory effort/excursion, speaking full sentences with ease, +moist cough on exam; Chest: Nontender, Movement normal; Abdomen: Soft, Nontender, Nondistended, Normal bowel sounds; ; Extremities: Pulses normal, No tenderness, No edema, No calf edema or asymmetry.; Neuro: AA&Ox3, Major CN grossly intact.  Gait steady, speech clear.  No gross focal motor or sensory deficits in extremities.; Skin: Color normal, Warm, Dry.   ED Course  Procedures  MDM MDM Reviewed: nursing note and vitals Interpretation: x-ray    09/15/2010  *RADIOLOGY REPORT*  Clinical Data: Cough.  CHEST - 2 VIEW  Comparison: None  Findings: The cardiac silhouette, mediastinal and hilar contours are within normal limits for age.  The lungs demonstrate emphysematous changes but no acute pulmonary findings. Apical scarring changes, right greater than left.  No pleural effusion. The  bony thorax is intact.  IMPRESSION: Emphysematous changes but no acute pulmonary findings.  Original Report Authenticated By: P. Loralie Champagne, M.D.    6:10 PM:  Pt doesn't want neb.  Walking around ED, states she doesn't want anything else and just wants to go  home right now.  Is agreeable to teach/treat MDI.  Emphysematous changes on CXR, no acute infiltrate.  No wheezing on today's exam.  Will tx abx, MDI.  Dx testing d/w pt.  Questions answered.  Verb understanding, agreeable to d/c home with outpt f/u.    Medications in ED:  albuterol (PROVENTIL) (5 MG/ML) 0.5% nebulizer solution 5 mg (not administered)  ipratropium (ATROVENT) 0.02 % nebulizer solution 0.5 mg (not administered)  doxycycline (VIBRA-TABS) 100 MG tablet (not administered)  albuterol (PROVENTIL HFA;VENTOLIN HFA) 108 (90 BASE) MCG/ACT inhaler ( puff  Given 09/15/10 1800)    New Prescriptions   DOXYCYCLINE (VIBRA-TABS) 100 MG TABLET    Take 1 tablet (100 mg total) by mouth 2 (two) times daily.   Avalyn Molino Allison Quarry, DO 09/17/10 2201

## 2010-09-15 NOTE — ED Notes (Signed)
C/o nasal congestion with clear nasal drainage, productive cough of whitish to yellow secretions, incontinent of urine with cough, headache which she rates 5 on 1-10 scale--onset of symptoms 4 days ago--one pack per day smoker

## 2010-09-15 NOTE — ED Notes (Signed)
She has neb trt ordered but, refuses--states she has tried those at home and they never help.

## 2010-09-15 NOTE — ED Notes (Signed)
Pt c/o nasal congestion and cough x 4 days. Denies fever. States that she is coughing up yellow and white phlegm.

## 2010-10-08 LAB — DIFFERENTIAL
Basophils Absolute: 0
Basophils Relative: 1
Eosinophils Absolute: 0.3
Lymphs Abs: 3.1
Monocytes Relative: 7

## 2010-10-08 LAB — COMPREHENSIVE METABOLIC PANEL
ALT: 14
Albumin: 3.8
Alkaline Phosphatase: 79
BUN: 6
Calcium: 9.4
Glucose, Bld: 99
Sodium: 140
Total Protein: 6.9

## 2010-10-08 LAB — CBC
HCT: 38.6
Hemoglobin: 13.4
MCV: 92.3
RBC: 4.18

## 2011-02-17 ENCOUNTER — Other Ambulatory Visit (HOSPITAL_COMMUNITY): Payer: Self-pay | Admitting: Oncology

## 2011-02-17 DIAGNOSIS — Z139 Encounter for screening, unspecified: Secondary | ICD-10-CM

## 2011-02-20 ENCOUNTER — Ambulatory Visit (HOSPITAL_COMMUNITY)
Admission: RE | Admit: 2011-02-20 | Discharge: 2011-02-20 | Disposition: A | Discharge status: Home / Self Care | Payer: Medicare Other | Source: Ambulatory Visit | Attending: Oncology | Admitting: Oncology

## 2011-02-20 DIAGNOSIS — Z1231 Encounter for screening mammogram for malignant neoplasm of breast: Secondary | ICD-10-CM | POA: Insufficient documentation

## 2011-02-20 DIAGNOSIS — Z139 Encounter for screening, unspecified: Secondary | ICD-10-CM

## 2011-04-03 ENCOUNTER — Other Ambulatory Visit (HOSPITAL_COMMUNITY): Payer: Self-pay | Admitting: Internal Medicine

## 2011-04-03 DIAGNOSIS — Z139 Encounter for screening, unspecified: Secondary | ICD-10-CM

## 2011-04-07 ENCOUNTER — Ambulatory Visit (HOSPITAL_COMMUNITY)
Admission: RE | Admit: 2011-04-07 | Discharge: 2011-04-07 | Disposition: A | Discharge status: Home / Self Care | Payer: Medicare Other | Source: Ambulatory Visit | Attending: Internal Medicine | Admitting: Internal Medicine

## 2011-04-07 DIAGNOSIS — C50919 Malignant neoplasm of unspecified site of unspecified female breast: Secondary | ICD-10-CM | POA: Insufficient documentation

## 2011-04-07 DIAGNOSIS — F17200 Nicotine dependence, unspecified, uncomplicated: Secondary | ICD-10-CM | POA: Insufficient documentation

## 2011-04-07 DIAGNOSIS — Z13820 Encounter for screening for osteoporosis: Secondary | ICD-10-CM | POA: Insufficient documentation

## 2011-04-07 DIAGNOSIS — M810 Age-related osteoporosis without current pathological fracture: Secondary | ICD-10-CM | POA: Insufficient documentation

## 2011-04-07 DIAGNOSIS — Z139 Encounter for screening, unspecified: Secondary | ICD-10-CM

## 2011-04-07 DIAGNOSIS — Z780 Asymptomatic menopausal state: Secondary | ICD-10-CM | POA: Insufficient documentation

## 2011-04-23 ENCOUNTER — Encounter (HOSPITAL_COMMUNITY): Payer: Medicare Other | Attending: Oncology | Admitting: Oncology

## 2011-04-23 ENCOUNTER — Encounter (HOSPITAL_COMMUNITY): Payer: Self-pay | Admitting: Oncology

## 2011-04-23 VITALS — BP 109/68 | HR 97 | Temp 98.3°F | Ht 62.0 in | Wt 127.4 lb

## 2011-04-23 DIAGNOSIS — J449 Chronic obstructive pulmonary disease, unspecified: Secondary | ICD-10-CM | POA: Insufficient documentation

## 2011-04-23 DIAGNOSIS — F17200 Nicotine dependence, unspecified, uncomplicated: Secondary | ICD-10-CM

## 2011-04-23 DIAGNOSIS — Z853 Personal history of malignant neoplasm of breast: Secondary | ICD-10-CM | POA: Insufficient documentation

## 2011-04-23 DIAGNOSIS — L989 Disorder of the skin and subcutaneous tissue, unspecified: Secondary | ICD-10-CM | POA: Insufficient documentation

## 2011-04-23 DIAGNOSIS — Z09 Encounter for follow-up examination after completed treatment for conditions other than malignant neoplasm: Secondary | ICD-10-CM | POA: Insufficient documentation

## 2011-04-23 DIAGNOSIS — C50919 Malignant neoplasm of unspecified site of unspecified female breast: Secondary | ICD-10-CM

## 2011-04-23 DIAGNOSIS — J4489 Other specified chronic obstructive pulmonary disease: Secondary | ICD-10-CM | POA: Insufficient documentation

## 2011-04-23 DIAGNOSIS — M810 Age-related osteoporosis without current pathological fracture: Secondary | ICD-10-CM | POA: Insufficient documentation

## 2011-04-23 DIAGNOSIS — L280 Lichen simplex chronicus: Secondary | ICD-10-CM | POA: Insufficient documentation

## 2011-04-23 LAB — COMPREHENSIVE METABOLIC PANEL
ALT: 13 U/L (ref 0–35)
Alkaline Phosphatase: 80 U/L (ref 39–117)
CO2: 28 mEq/L (ref 19–32)
Chloride: 104 mEq/L (ref 96–112)
GFR calc Af Amer: >90 mL/min (ref >90)
Glucose, Bld: 99 mg/dL (ref 70–99)
Potassium: 3.9 mEq/L (ref 3.5–5.1)
Sodium: 139 mEq/L (ref 135–145)
Total Protein: 6.1 g/dL (ref 6.0–8.3)

## 2011-04-23 LAB — CBC
Hemoglobin: 13.2 g/dL (ref 12.0–15.0)
MCHC: 32.1 g/dL (ref 30.0–36.0)
RBC: 4.34 MIL/uL (ref 3.87–5.11)
WBC: 6.6 10*3/uL (ref 4.0–10.5)

## 2011-04-23 LAB — DIFFERENTIAL
Eosinophils Absolute: 0.2 10*3/uL (ref 0.0–0.7)
Eosinophils Relative: 3 % (ref 0–5)
Lymphs Abs: 2.5 10*3/uL (ref 0.7–4.0)
Monocytes Relative: 7 % (ref 3–12)

## 2011-04-23 NOTE — Progress Notes (Signed)
CC:   Margaret Thompson. Margaret Gambler, MD Margaret Thompson, M.D. Margaret Bonds, RN  DIAGNOSES: 1. Stage II (T1c N1 M0) adenocarcinoma of the left breast with surgery     on 03/08/2001 for a 1.5 cm cancer with 1/6 positive sentinel nodes.     Re-excision margins were negative.  She participated in NSABPB-30     randomized Adriamycin and Taxotere for 4 cycles followed by     radiation therapy from 07/13/2001 to 08/27/2001.  We then started     Arimidex on 07/07/2001 and she took that until the end of June     2008. 2. Lesion on the right ear worrisome for an early skin cancer     approximately 5 mm across.  Will set up an appointment with Dr.     Margo Aye and Charlton Haws. 3. Neurodermatitis. 4. Dry skin. 5. Osteoporosis on therapy. 6. Hysterectomy, but still with her ovaries with a GYN exam by Dr.     Emelda Fear in the past. 7. Zithromax allergy with urticaria. 8. Chronic obstructive pulmonary disease, still smoking a pack a day. 9. Tonsillectomy at age 47.  Margaret Thompson is going to be 78 this month, but does well.  Her only complaint is that of the lesion on the right ear which is raised, elevated, rounded and she noticed it first when she scraped her finger across it and a scab came off, and that was about 2-1/2 months ago.  It has not gone away since.  She has tried to leave it alone.  It is still very rounded. I think it is smooth, but there may be slight irregularity to it on the one edge, so I think it needs to be biopsied.  We will set her up with an appointment.  Other than that, she is still smoking.  Denies any trouble breathing.  Bowels are working well, etc.  PHYSICAL EXAMINATION:  Her vital signs are very stable.  Weight is 127 pounds and that is almost what it was in 2010.  She has a normal blood pressure 109/68 right arm sitting position, pulse 88-92 and regular, respirations 16 and unlabored.  She is afebrile and denies any pain. She has clear lungs, but markedly diminished breath sounds.   Abdomen: Soft and nontender without organomegaly.  Both breasts are negative for masses.  The left is still tender and slightly distorted because of the surgery and radiation, but no masses or suspicious skin findings.  She has no peripheral edema.  So she looks very stable.  We will see her back in a year.  Will get a CBC and diff today, and see her as I mentioned.    ______________________________ Ladona Horns. Mariel Sleet, MD ESN/MEDQ  D:  04/23/2011  T:  04/23/2011  Job:  [id number]

## 2011-04-23 NOTE — Progress Notes (Signed)
This office note has been dictated.

## 2011-04-23 NOTE — Patient Instructions (Signed)
Margaret Thompson  [id number] Jun 12, 1933   [facility]  Discharge Instructions  RECOMMENDATIONS MADE BY THE CONSULTANT AND ANY TEST RESULTS WILL BE SENT TO YOUR REFERRING DOCTOR.   EXAM FINDINGS BY MD TODAY AND SIGNS AND SYMPTOMS TO REPORT TO CLINIC OR PRIMARY MD: you are doing well.  Need to get you an appointment with a dermatologist to check the area on your right ear.  MEDICATIONS PRESCRIBED: none   INSTRUCTIONS GIVEN AND DISCUSSED: Other :  Report any new lumps, bone pain or shortness of breath.  SPECIAL INSTRUCTIONS/FOLLOW-UP: Lab work Needed today and Return to Clinic in 1 year to see MD.   I acknowledge that I have been informed and understand all the instructions given to me and received a copy. I do not have any more questions at this time, but understand that I may call the Specialty Clinic at [facility] at [phone] during business hours should I have any further questions or need assistance in obtaining follow-up care.    __________________________________________  _____________  __________ Signature of Patient or Authorized Representative            Date                   Time    __________________________________________ Nurse's Signature

## 2011-04-23 NOTE — Progress Notes (Signed)
Dermatology appointment made with Dr. Margo Aye on 4/18 @ 11:30am.  Patient aware.

## 2012-02-04 ENCOUNTER — Emergency Department (HOSPITAL_COMMUNITY): Payer: Medicare Other

## 2012-02-04 ENCOUNTER — Encounter (HOSPITAL_COMMUNITY): Payer: Self-pay | Admitting: *Deleted

## 2012-02-04 ENCOUNTER — Emergency Department (HOSPITAL_COMMUNITY)
Admission: EM | Admit: 2012-02-04 | Discharge: 2012-02-04 | Disposition: A | Discharge status: Home / Self Care | Payer: Medicare Other | Attending: Emergency Medicine | Admitting: Emergency Medicine

## 2012-02-04 DIAGNOSIS — Z8679 Personal history of other diseases of the circulatory system: Secondary | ICD-10-CM | POA: Insufficient documentation

## 2012-02-04 DIAGNOSIS — M25519 Pain in unspecified shoulder: Secondary | ICD-10-CM | POA: Insufficient documentation

## 2012-02-04 DIAGNOSIS — F411 Generalized anxiety disorder: Secondary | ICD-10-CM | POA: Insufficient documentation

## 2012-02-04 DIAGNOSIS — F17200 Nicotine dependence, unspecified, uncomplicated: Secondary | ICD-10-CM | POA: Insufficient documentation

## 2012-02-04 DIAGNOSIS — Z79899 Other long term (current) drug therapy: Secondary | ICD-10-CM | POA: Insufficient documentation

## 2012-02-04 DIAGNOSIS — Z853 Personal history of malignant neoplasm of breast: Secondary | ICD-10-CM | POA: Insufficient documentation

## 2012-02-04 DIAGNOSIS — M810 Age-related osteoporosis without current pathological fracture: Secondary | ICD-10-CM | POA: Insufficient documentation

## 2012-02-04 DIAGNOSIS — F410 Panic disorder [episodic paroxysmal anxiety] without agoraphobia: Secondary | ICD-10-CM | POA: Insufficient documentation

## 2012-02-04 DIAGNOSIS — M2550 Pain in unspecified joint: Secondary | ICD-10-CM | POA: Insufficient documentation

## 2012-02-04 MED ORDER — HYDROCODONE-ACETAMINOPHEN 5-325 MG PO TABS: 1.0000 | ORAL_TABLET | Freq: Four times a day (QID) | ORAL | Status: DC | PRN

## 2012-02-04 MED ORDER — HYDROCODONE-ACETAMINOPHEN 5-325 MG PO TABS
1.0000 | ORAL_TABLET | Freq: Once | ORAL | Status: AC
  Administered 2012-02-04: 1 via ORAL
  Filled 2012-02-04: qty 1

## 2012-02-04 NOTE — ED Notes (Addendum)
Pt states left shoulder pain "for a while". Pt states pain radiates across upper back and up neck at times. Hurts worse with movement. Pt states she was seen by a PA on 1/17 and given doxycycline and indomethacin for "sinuses and possible bursitis"  with no improvement. Pt stats she stopped taking medication yesterday because it interfered with her antacid

## 2012-02-04 NOTE — ED Provider Notes (Signed)
History   This chart was scribed for Benny Lennert, MD by Charolett Bumpers, ED Scribe. The patient was seen in room APA03/APA03. Patient's care was started at 1224.   CSN: [id number]  Arrival date & time 02/04/12  1154   First MD Initiated Contact with Patient 02/04/12 1224      Chief Complaint  Patient presents with  . Shoulder Pain   Margaret Thompson is a 79 y.o. female who presents to the Emergency Department complaining of gradual onset left shoulder pain that has recently worsened over the past few days. She denies any known injuries or falls. Her pain is worse with movement. She denies any fevers or chills. She also complains of anxiety and states that she has been having panic attacks secondary to pain. She has a h/o breast CA. Pt was seen on 1/17 and given Doxycycline and Indomethacin. She reports no improvement of her symptoms and states she stopped taking those medications.   Patient is a 79 y.o. female presenting with shoulder pain. The history is provided by the patient. No language interpreter was used.  Shoulder Pain This is a recurrent problem. The problem occurs constantly. The problem has been gradually worsening. Pertinent negatives include no chest pain, no abdominal pain and no headaches. Exacerbated by: movement.    Past Medical History  Diagnosis Date  . Osteoporosis   . Breast cancer   . Bilateral cataracts     Past Surgical History  Procedure Date  . Breast surgery   . Abdominal hysterectomy   . Mastectomy, partial   . Portacath placement   . Port-a-cath removal     Family History  Problem Relation Age of Onset  . Stroke Mother     History  Substance Use Topics  . Smoking status: Current Every Day Smoker    Types: Cigarettes  . Smokeless tobacco: Never Used  . Alcohol Use: No    OB History    Grav Para Term Preterm Abortions TAB SAB Ect Mult Living                  Review of Systems  Constitutional: Negative for fatigue.  HENT:  Negative for congestion, sinus pressure and ear discharge.   Eyes: Negative for discharge.  Respiratory: Negative for cough.   Cardiovascular: Negative for chest pain.  Gastrointestinal: Negative for abdominal pain and diarrhea.  Genitourinary: Negative for frequency and hematuria.  Musculoskeletal: Positive for arthralgias. Negative for back pain.  Skin: Negative for rash.  Neurological: Negative for seizures and headaches.  Hematological: Negative.   Psychiatric/Behavioral: Negative for hallucinations. The patient is nervous/anxious.   All other systems reviewed and are negative.    Allergies  Azithromycin  Home Medications   Current Outpatient Rx  Name  Route  Sig  Dispense  Refill  . CALCIUM CARBONATE-VITAMIN D 300-100 MG-UNIT PO CAPS   Oral   Take by mouth daily.         . IBUPROFEN 200 MG PO CAPS   Oral   Take by mouth as needed.         Marland Kitchen RANITIDINE HCL 150 MG PO TABS   Oral   Take 150 mg by mouth 2 (two) times daily as needed.           BP 146/70  Pulse 80  Temp 97.5 F (36.4 C) (Oral)  Resp 20  Ht 5\' 2"  (1.575 m)  Wt 128 lb (58.06 kg)  BMI 23.41 kg/m2  SpO2 99%  Physical Exam  Constitutional: She is oriented to person, place, and time. She appears well-developed.  HENT:  Head: Normocephalic.  Eyes: Conjunctivae normal and EOM are normal.  Neck: No tracheal deviation present.  Cardiovascular: Intact distal pulses.   No murmur heard. Musculoskeletal: Normal range of motion. She exhibits tenderness.       Left shoulder tender posteriorly. Neurovascularly intact. Pulses intact.   Neurological: She is alert and oriented to person, place, and time.  Skin: Skin is warm and dry.  Psychiatric: She has a normal mood and affect.    ED Course  Procedures (including critical care time)  DIAGNOSTIC STUDIES: Oxygen Saturation is 99% on room air, normal by my interpretation.    COORDINATION OF CARE:  12:28-Discussed planned course of treatment with  the patient including pain management and an x-ray of her left shoulder, who is agreeable at this time.   12:30-Medication Orders: Hydrocodone-acetaminophen (Norco/Vicodin) 5-325 mg per tablet 1 tablet-once.   Labs Reviewed - No data to display No results found.   No diagnosis found.    MDM     The chart was scribed for me under my direct supervision.  I personally performed the history, physical, and medical decision making and all procedures in the evaluation of this patient.Benny Lennert, MD 02/04/12 1336

## 2012-02-13 ENCOUNTER — Ambulatory Visit (HOSPITAL_COMMUNITY)
Admission: RE | Admit: 2012-02-13 | Discharge: 2012-02-13 | Disposition: A | Discharge status: Home / Self Care | Payer: Medicare Other | Source: Ambulatory Visit | Attending: Internal Medicine | Admitting: Internal Medicine

## 2012-02-13 ENCOUNTER — Other Ambulatory Visit (HOSPITAL_COMMUNITY): Payer: Self-pay | Admitting: Internal Medicine

## 2012-02-13 DIAGNOSIS — M25579 Pain in unspecified ankle and joints of unspecified foot: Secondary | ICD-10-CM | POA: Insufficient documentation

## 2012-02-13 DIAGNOSIS — M12579 Traumatic arthropathy, unspecified ankle and foot: Secondary | ICD-10-CM

## 2012-02-13 DIAGNOSIS — S99929A Unspecified injury of unspecified foot, initial encounter: Secondary | ICD-10-CM | POA: Insufficient documentation

## 2012-02-13 DIAGNOSIS — S8990XA Unspecified injury of unspecified lower leg, initial encounter: Secondary | ICD-10-CM | POA: Insufficient documentation

## 2012-02-13 DIAGNOSIS — X500XXA Overexertion from strenuous movement or load, initial encounter: Secondary | ICD-10-CM | POA: Insufficient documentation

## 2012-02-18 ENCOUNTER — Other Ambulatory Visit (HOSPITAL_COMMUNITY): Payer: Self-pay | Admitting: Oncology

## 2012-02-18 DIAGNOSIS — Z139 Encounter for screening, unspecified: Secondary | ICD-10-CM

## 2012-02-24 ENCOUNTER — Ambulatory Visit (HOSPITAL_COMMUNITY)
Admission: RE | Admit: 2012-02-24 | Discharge: 2012-02-24 | Disposition: A | Discharge status: Home / Self Care | Payer: Medicare Other | Source: Ambulatory Visit | Attending: Oncology | Admitting: Oncology

## 2012-02-24 DIAGNOSIS — Z139 Encounter for screening, unspecified: Secondary | ICD-10-CM

## 2012-02-24 DIAGNOSIS — Z1231 Encounter for screening mammogram for malignant neoplasm of breast: Secondary | ICD-10-CM | POA: Insufficient documentation

## 2012-04-21 ENCOUNTER — Encounter (HOSPITAL_COMMUNITY): Payer: Medicare Other | Attending: Oncology | Admitting: Oncology

## 2012-04-21 ENCOUNTER — Encounter (HOSPITAL_COMMUNITY): Payer: Self-pay | Admitting: Oncology

## 2012-04-21 VITALS — BP 124/71 | HR 102 | Temp 98.3°F | Resp 18 | Wt 126.0 lb

## 2012-04-21 DIAGNOSIS — Z09 Encounter for follow-up examination after completed treatment for conditions other than malignant neoplasm: Secondary | ICD-10-CM | POA: Insufficient documentation

## 2012-04-21 DIAGNOSIS — G8929 Other chronic pain: Secondary | ICD-10-CM

## 2012-04-21 DIAGNOSIS — C50919 Malignant neoplasm of unspecified site of unspecified female breast: Secondary | ICD-10-CM

## 2012-04-21 DIAGNOSIS — J449 Chronic obstructive pulmonary disease, unspecified: Secondary | ICD-10-CM

## 2012-04-21 DIAGNOSIS — M810 Age-related osteoporosis without current pathological fracture: Secondary | ICD-10-CM

## 2012-04-21 DIAGNOSIS — J4489 Other specified chronic obstructive pulmonary disease: Secondary | ICD-10-CM | POA: Insufficient documentation

## 2012-04-21 DIAGNOSIS — Z853 Personal history of malignant neoplasm of breast: Secondary | ICD-10-CM | POA: Insufficient documentation

## 2012-04-21 DIAGNOSIS — M25519 Pain in unspecified shoulder: Secondary | ICD-10-CM | POA: Insufficient documentation

## 2012-04-21 LAB — CBC WITH DIFFERENTIAL/PLATELET
Basophils Absolute: 0 10*3/uL (ref 0.0–0.1)
Eosinophils Relative: 2 % (ref 0–5)
HCT: 41.4 % (ref 36.0–46.0)
Hemoglobin: 13.7 g/dL (ref 12.0–15.0)
Lymphocytes Relative: 29 % (ref 12–46)
Lymphs Abs: 3 10*3/uL (ref 0.7–4.0)
MCV: 95.2 fL (ref 78.0–100.0)
Monocytes Absolute: 0.6 10*3/uL (ref 0.1–1.0)
Neutro Abs: 6.5 10*3/uL (ref 1.7–7.7)
RBC: 4.35 MIL/uL (ref 3.87–5.11)
WBC: 10.4 10*3/uL (ref 4.0–10.5)

## 2012-04-21 LAB — COMPREHENSIVE METABOLIC PANEL
ALT: 12 U/L (ref 0–35)
AST: 15 U/L (ref 0–37)
CO2: 27 mEq/L (ref 19–32)
Calcium: 9.8 mg/dL (ref 8.4–10.5)
Chloride: 100 mEq/L (ref 96–112)
Creatinine, Ser: 0.82 mg/dL (ref 0.50–1.10)
GFR calc Af Amer: 79 mL/min — ABNORMAL LOW (ref >90)
GFR calc non Af Amer: 68 mL/min — ABNORMAL LOW (ref >90)
Glucose, Bld: 100 mg/dL — ABNORMAL HIGH (ref 70–99)
Total Bilirubin: 0.2 mg/dL — ABNORMAL LOW (ref 0.3–1.2)

## 2012-04-21 MED ORDER — HYDROCODONE-ACETAMINOPHEN 5-300 MG PO TABS: 1.0000 | ORAL_TABLET | Freq: Four times a day (QID) | ORAL | Status: DC | PRN

## 2012-04-21 NOTE — Patient Instructions (Addendum)
[facility] Discharge Instructions  RECOMMENDATIONS MADE BY THE CONSULTANT AND ANY TEST RESULTS WILL BE SENT TO YOUR REFERRING PHYSICIAN.  EXAM FINDINGS BY THE PHYSICIAN TODAY AND SIGNS OR SYMPTOMS TO REPORT TO CLINIC OR PRIMARY PHYSICIAN: Exam and discussion by MD.  Bonita Quin need to see a dentist.  Once you do call us and let us know so we can get you on something for your bones.  You have osteoporosis and are at risk for fractures.  We will do a bone scan because of your history of breast cancer and are now having shoulder and arm pain.  We will also make a referral to Dr. Romeo Apple for evaluation of your shoulder and arm discomfort. CT scan of your chest will be done because of your smoking history.   MEDICATIONS PRESCRIBED:  Vicodin 5-300 - can take 1/2 to 1 tablet every 6 hours as needed for pain. Calcium 600 mg daily (can get over the counter) Vitamin D 1000 - 2000 units daily (can get over the counter)   INSTRUCTIONS GIVEN AND DISCUSSED: Report any new lumps, bone pain, shortness of breath or other symptoms.  SPECIAL INSTRUCTIONS/FOLLOW-UP: Bone Scan on Friday.  Check in xray at 10:45 am then when you come back for the scan at 2pm they will do the CT scan of your chest afterwards. Dr. Romeo Apple appointment on 4/30 and need to arrive at the office at 9am.   Follow-up here in 1 month May 12 at 9am.  Thank you for choosing [facility] to provide your oncology and hematology care.  To afford each patient quality time with our providers, please arrive at least 15 minutes before your scheduled appointment time.  With your help, our goal is to use those 15 minutes to complete the necessary work-up to ensure our physicians have the information they need to help with your evaluation and healthcare recommendations.    Effective January 1st, 2014, we ask that you re-schedule your appointment with our physicians should you arrive 10 or more minutes late for your  appointment.  We strive to give you quality time with our providers, and arriving late affects you and other patients whose appointments are after yours.    Again, thank you for choosing [facility]  Our hope is that these requests will decrease the amount of time that you wait before being seen by our physicians.       _____________________________________________________________  Should you have questions after your visit to [facility], please contact our office at [phone] between the hours of 8:30 a.m. and 5:00 p.m.  Voicemails left after 4:30 p.m. will not be returned until the following business day.  For prescription refill requests, have your pharmacy contact our office with your prescription refill request.

## 2012-04-21 NOTE — Progress Notes (Signed)
Diagnoses #1 stage II (T1 C. N1 M0) adenocarcinoma the left breast with surgery on your the 24th 2003 1.5 cm cancer with one of 6 positive sentinel nodes. Reexcision margins were clear. She did participate in NSABP-30 randomized trial and was treated with Adriamycin and docetaxel for 4 cycles followed by radiation therapy from 07/13/2001 through 08/27/2001. She was then started on Arimidex on 07/07/2001 and took until the end of June 2008. Thus far she has no evidence for recurrent disease.  #2 new onset of left shoulder pain since January 2014. She had an x-ray only of left shoulder showing arthritic changes but she is in significant discomfort with pain scores ranging from 5-10 occasionally. She has been using Advil 400 mg 2 times a day and was given Vicodin by Dr. Sherwood Gambler which takes her pain down to approximately to a 3 but does not allow her to sleep any better. She uses a half a pill typically only.  #3 COPD with a long-standing smoking history since 1971 approximately 1 pack per day. #4 poor dental hygiene multiple missing teeth and what teeth are remaining are in very poor shape. She brushes her teeth no more than once a day typically it does not use any dental floss. She is convinced that the chemotherapy help destroy her teeth. #5 osteoporosis not on any therapy presently. If she gets her teeth evaluated and cleared we could give her denosumab or zoledronic acid but for right now we have asked her to take 1000-2000 units of vitamin D per day at 1 600 mg calcium tablet a day. She promises to see the dentist at the free clinic #6 tonsillectomy at age 79 #7 neurodermatitis #8 hysterectomy but still with her ovaries and she is seeing Dr. Emelda Fear in the past #9 Zithromax allergy with urticaria  Her big complaints her review of systems oncologically is the left shoulder discomfort which is not gotten any better since January when it started. She had no trauma. She has still some range of motion issues  but she is doing pretty well but very very slow to get her shoulder internally or externally rotated. She cannot get it completely straight up.  She has no adenopathy. She has not lost weight. She's not having fevers or chills.  Is chronically short winded which is not new or different. She is living by herself and states that the pain is so bad at night she gets a panic attack.  She is occasionally constipated and cannot remember when she had her last colonoscopy.  Vital signs are stable otherwise. She is in no acute distress but looks chronically ill. She has numerous crepitations over her left shoulder joint. There no crepitations of the right shoulder joint. I cannot detect fluid in the left shoulder joint however.  she has an unremarkable skin exam presently. Breast exam shows the changes from surgery radiation left breast but there no masses in either breast. She has no lymphadenopathy. Her lungs are clear but with diminished breath sounds. Heart shows a regular rhythm and rate without murmur rub or gallop. Abdomen is soft and nontender without hepatosplenomegaly. Bowel sounds are present. She has no leg edema no arm edema. She is alert and oriented.  Throat is clear but her lower teeth are in very poor repair.  She needs a bone scan to make sure this is not breast cancer and she needs a CAT scan of her chest to make sure this does not lung cancer as those are okay along with  her blood work she needs to see Dr. Romeo Apple in consultation who may or may not be able to resolve this without an MRI of her shoulder. We will see her in a month to make sure everything has been put in place. If all is well then we'll see her one year later

## 2012-04-23 ENCOUNTER — Encounter (HOSPITAL_COMMUNITY)
Admission: RE | Admit: 2012-04-23 | Discharge: 2012-04-23 | Disposition: A | Discharge status: Home / Self Care | Payer: Medicare Other | Source: Ambulatory Visit | Attending: Oncology | Admitting: Oncology

## 2012-04-23 ENCOUNTER — Ambulatory Visit (HOSPITAL_COMMUNITY)
Admission: RE | Admit: 2012-04-23 | Discharge: 2012-04-23 | Disposition: A | Discharge status: Home / Self Care | Payer: Medicare Other | Source: Ambulatory Visit | Attending: Oncology | Admitting: Oncology

## 2012-04-23 ENCOUNTER — Encounter (HOSPITAL_COMMUNITY): Payer: Self-pay

## 2012-04-23 DIAGNOSIS — M25512 Pain in left shoulder: Secondary | ICD-10-CM

## 2012-04-23 DIAGNOSIS — C50919 Malignant neoplasm of unspecified site of unspecified female breast: Secondary | ICD-10-CM

## 2012-04-23 DIAGNOSIS — J449 Chronic obstructive pulmonary disease, unspecified: Secondary | ICD-10-CM

## 2012-04-23 DIAGNOSIS — Z9221 Personal history of antineoplastic chemotherapy: Secondary | ICD-10-CM | POA: Insufficient documentation

## 2012-04-23 DIAGNOSIS — G8929 Other chronic pain: Secondary | ICD-10-CM

## 2012-04-23 DIAGNOSIS — Z853 Personal history of malignant neoplasm of breast: Secondary | ICD-10-CM | POA: Insufficient documentation

## 2012-04-23 DIAGNOSIS — J4489 Other specified chronic obstructive pulmonary disease: Secondary | ICD-10-CM | POA: Insufficient documentation

## 2012-04-23 DIAGNOSIS — M25519 Pain in unspecified shoulder: Secondary | ICD-10-CM | POA: Insufficient documentation

## 2012-04-23 DIAGNOSIS — Z87891 Personal history of nicotine dependence: Secondary | ICD-10-CM | POA: Insufficient documentation

## 2012-04-23 MED ORDER — TECHNETIUM TC 99M MEDRONATE IV KIT
25.0000 | PACK | Freq: Once | INTRAVENOUS | Status: AC | PRN
  Administered 2012-04-23: 25.1 via INTRAVENOUS

## 2012-05-12 ENCOUNTER — Ambulatory Visit: Payer: Medicare Other | Admitting: Orthopedic Surgery

## 2012-05-24 ENCOUNTER — Encounter (HOSPITAL_COMMUNITY): Payer: Self-pay | Admitting: Oncology

## 2012-05-24 ENCOUNTER — Encounter (HOSPITAL_COMMUNITY): Payer: Medicare Other | Attending: Oncology | Admitting: Oncology

## 2012-05-24 VITALS — BP 112/71 | HR 96 | Temp 98.3°F | Resp 20 | Wt 126.3 lb

## 2012-05-24 DIAGNOSIS — M810 Age-related osteoporosis without current pathological fracture: Secondary | ICD-10-CM

## 2012-05-24 DIAGNOSIS — R05 Cough: Secondary | ICD-10-CM

## 2012-05-24 DIAGNOSIS — J441 Chronic obstructive pulmonary disease with (acute) exacerbation: Secondary | ICD-10-CM | POA: Insufficient documentation

## 2012-05-24 DIAGNOSIS — Z853 Personal history of malignant neoplasm of breast: Secondary | ICD-10-CM

## 2012-05-24 DIAGNOSIS — J449 Chronic obstructive pulmonary disease, unspecified: Secondary | ICD-10-CM

## 2012-05-24 MED ORDER — FLUTICASONE-SALMETEROL 100-50 MCG/DOSE IN AEPB: 1.0000 | INHALATION_SPRAY | Freq: Two times a day (BID) | RESPIRATORY_TRACT | Status: DC

## 2012-05-24 NOTE — Progress Notes (Signed)
#  1. Stage II (T1 C. N1 M0) adenocarcinoma of the left breast with surgery on 03/08/2001. She had a 1.5 cm cancer with 1/6 positive sentinel nodes. Reexcision margins were clear. She did participate in NSABP B.-30 randomized to Adriamycin and Taxotere for 4 cycles followed by radiation therapy from 07/13/2001 to 08/27/2001. She then started Arimidex on 07/07/2001 took that until the end of June 2008. Thus far she has no evidence for recurrence  #2 neurodermatitis #3 skin lesion on the right ear #4 dry skin #5 COPD still smoking half a pack of cigarettes a day. She has been a very heavy smoker her life. #6 Zithromax allergy-urticaria #7 tonsillectomy at age 29 #8 osteoporosis on therapy  Margaret Thompson states that her left shoulder pain is much improved with conservative therapy but still has an appointment to see Dr. Romeo Apple in the very near future. I've encouraged her to go. From the standpoint of her lung cancer risk there was no distinct evidence for tumor in her chest but she did have changes consistent with bronchitis. She had bronchial wall thickening. She is coughing extensively now but bringing up only clear phlegm. There is no blood or discolored mucus. She does not have a fever today.  She has never had an inhaler.  Her lungs today show a couple rhonchi especially on the left mid lung. The right is clear. Her heart shows a regular rhythm and rate. Pulses 88-92 and regular. She is in no acute distress.  She would like a cough syrup over-the-counter, she can use Robitussin DM. She does not need an antibiotic my opinion but she may need an Advair inhaler.  She is willing to try that for a month. I've asked her to quit smoking again.  She is a CAT scan repeated in one year. We will set that up only see her in 6 months.  She still shows no evidence of recurrent breast cancer.

## 2012-05-24 NOTE — Patient Instructions (Addendum)
[facility] Discharge Instructions  RECOMMENDATIONS MADE BY THE CONSULTANT AND ANY TEST RESULTS WILL BE SENT TO YOUR REFERRING PHYSICIAN.  EXAM FINDINGS BY THE PHYSICIAN TODAY AND SIGNS OR SYMPTOMS TO REPORT TO CLINIC OR PRIMARY PHYSICIAN: Exam and discussion by MD.  Bonita Quin need to follow - up with Dr. Romeo Apple about your shoulder.  CT Scan of your chest showed small nodule that we are just going to recheck in 1 year and the walls of your airway are thickened from irritation from your smoking.  You need to stop smoking if at all possible.  MEDICATIONS PRESCRIBED:  Robitussin M - over the counter -use as directed Advair inhaler - use twice daily and call us in 2 weeks to let us know how you are doing.  INSTRUCTIONS GIVEN AND DISCUSSED: Report any new lumps, bone pain or increased shortness of breath.  SPECIAL INSTRUCTIONS/FOLLOW-UP: Follow-up in 6 months.  Thank you for choosing [facility] to provide your oncology and hematology care.  To afford each patient quality time with our providers, please arrive at least 15 minutes before your scheduled appointment time.  With your help, our goal is to use those 15 minutes to complete the necessary work-up to ensure our physicians have the information they need to help with your evaluation and healthcare recommendations.    Effective January 1st, 2014, we ask that you re-schedule your appointment with our physicians should you arrive 10 or more minutes late for your appointment.  We strive to give you quality time with our providers, and arriving late affects you and other patients whose appointments are after yours.    Again, thank you for choosing [facility]  Our hope is that these requests will decrease the amount of time that you wait before being seen by our physicians.       _____________________________________________________________  Should you have questions after your visit to [facility], please contact our office at [phone] between the hours of 8:30 a.m. and 5:00 p.m.  Voicemails left after 4:30 p.m. will not be returned until the following business day.  For prescription refill requests, have your pharmacy contact our office with your prescription refill request.

## 2012-05-25 ENCOUNTER — Ambulatory Visit (INDEPENDENT_AMBULATORY_CARE_PROVIDER_SITE_OTHER): Payer: Medicare Other | Admitting: Orthopedic Surgery

## 2012-05-25 ENCOUNTER — Encounter: Payer: Self-pay | Admitting: Orthopedic Surgery

## 2012-05-25 VITALS — BP 118/60 | Ht 62.0 in | Wt 125.0 lb

## 2012-05-25 DIAGNOSIS — M7550 Bursitis of unspecified shoulder: Secondary | ICD-10-CM | POA: Insufficient documentation

## 2012-05-25 DIAGNOSIS — M7552 Bursitis of left shoulder: Secondary | ICD-10-CM

## 2012-05-25 DIAGNOSIS — M67919 Unspecified disorder of synovium and tendon, unspecified shoulder: Secondary | ICD-10-CM

## 2012-05-25 NOTE — Progress Notes (Signed)
  Subjective:    Patient ID: Margaret Thompson, female    DOB: 17-May-1933, 79 y.o.   MRN: [id number]  Shoulder Pain  The pain is present in the neck and left shoulder. This is a new problem. The current episode started more than 1 month ago (3 months ). There has been no history of extremity trauma. The problem occurs intermittently. The problem has been gradually improving. The quality of the pain is described as pounding. The pain is at a severity of 10/10. Associated symptoms include a limited range of motion, numbness and stiffness. Pertinent negatives include no fever, itching, joint locking or joint swelling.      Review of Systems  Constitutional: Negative for fever.  Respiratory: Positive for cough.   Gastrointestinal:       Heartburn  Musculoskeletal: Positive for stiffness.  Skin: Negative for itching.  Allergic/Immunologic: Positive for environmental allergies.  Neurological: Positive for numbness.  Hematological: Bruises/bleeds easily.  Psychiatric/Behavioral: The patient is nervous/anxious.   All other systems reviewed and are negative.       Objective:   Physical Exam  Vitals reviewed. Constitutional: She is oriented to person, place, and time. She appears well-developed and well-nourished.  HENT:  Head: Normocephalic.  Neck: Neck supple.  Tenderness base of the spine   Cardiovascular: Intact distal pulses.   Lymphadenopathy:    She has no cervical adenopathy.  Neurological: She is alert and oriented to person, place, and time. She has normal reflexes. She displays normal reflexes. She exhibits normal muscle tone. Coordination normal.  Skin: Skin is warm and dry. No rash noted. No erythema. No pallor.  Psychiatric: She has a normal mood and affect. Her behavior is normal. Judgment and thought content normal.  Right Shoulder Exam  Right shoulder exam is normal.  Tests  Apprehension: negative  Comments:  NORMAL ROM, STRENGTH, STABILITY AND NO TENDERNESS    Left  Shoulder Exam   Tenderness  Left shoulder tenderness location: pain around the deltoid   Muscle Strength  Internal Rotation: 5/5  External Rotation: 5/5  Supraspinatus: 4/5  Subscapularis: 5/5  Biceps: 5/5   Tests  Apprehension: negative Cross Arm: negative Drop Arm: negative Hawkin's test: positive Impingement: positive Sulcus: absent  Other  Erythema: absent Scars: absent Sensation: normal Pulse: present   Comments:  PROM-FLEX=170, ER=50, IR HIP POCKET             Assessment & Plan:  XRAYS NORMAL WERE DONE AT [facility]   Subacromial Shoulder Injection Procedure Note  Pre-operative Diagnosis: left RC Syndrome  Post-operative Diagnosis: same  Indications: pain   Anesthesia: ethyl chloride   Procedure Details   Verbal consent was obtained for the procedure. The shoulder was prepped withalcohol and the skin was anesthetized. A 20 gauge needle was advanced into the subacromial space through posterior approach without difficulty  The space was then injected with 3 ml 1% lidocaine and 1 ml of depomedrol. The injection site was cleansed with isopropyl alcohol and a dressing was applied.  Complications:  None; patient tolerated the procedure well.  Bursitis, shoulder, left

## 2012-05-25 NOTE — Patient Instructions (Addendum)
You have received a steroid shot. 15% of patients experience increased pain at the injection site with in the next 24 hours. This is best treated with ice and tylenol extra strength 2 tabs every 8 hours. If you are still having pain please call the office.    

## 2012-07-22 ENCOUNTER — Emergency Department (HOSPITAL_COMMUNITY)
Admission: EM | Admit: 2012-07-22 | Discharge: 2012-07-22 | Disposition: A | Discharge status: Home / Self Care | Payer: Medicare Other | Attending: Emergency Medicine | Admitting: Emergency Medicine

## 2012-07-22 ENCOUNTER — Encounter (HOSPITAL_COMMUNITY): Payer: Self-pay

## 2012-07-22 DIAGNOSIS — R21 Rash and other nonspecific skin eruption: Secondary | ICD-10-CM | POA: Insufficient documentation

## 2012-07-22 DIAGNOSIS — Z853 Personal history of malignant neoplasm of breast: Secondary | ICD-10-CM | POA: Insufficient documentation

## 2012-07-22 DIAGNOSIS — Z8669 Personal history of other diseases of the nervous system and sense organs: Secondary | ICD-10-CM | POA: Insufficient documentation

## 2012-07-22 DIAGNOSIS — F17200 Nicotine dependence, unspecified, uncomplicated: Secondary | ICD-10-CM | POA: Insufficient documentation

## 2012-07-22 DIAGNOSIS — T7840XA Allergy, unspecified, initial encounter: Secondary | ICD-10-CM

## 2012-07-22 DIAGNOSIS — IMO0002 Reserved for concepts with insufficient information to code with codable children: Secondary | ICD-10-CM | POA: Insufficient documentation

## 2012-07-22 DIAGNOSIS — L299 Pruritus, unspecified: Secondary | ICD-10-CM | POA: Insufficient documentation

## 2012-07-22 DIAGNOSIS — Z8739 Personal history of other diseases of the musculoskeletal system and connective tissue: Secondary | ICD-10-CM | POA: Insufficient documentation

## 2012-07-22 DIAGNOSIS — Z79899 Other long term (current) drug therapy: Secondary | ICD-10-CM | POA: Insufficient documentation

## 2012-07-22 MED ORDER — FAMOTIDINE 20 MG PO TABS
20.0000 mg | ORAL_TABLET | Freq: Once | ORAL | Status: AC
  Administered 2012-07-22: 20 mg via ORAL
  Filled 2012-07-22: qty 1

## 2012-07-22 MED ORDER — PREDNISONE 20 MG PO TABS
ORAL_TABLET | ORAL | Status: AC
  Filled 2012-07-22: qty 3

## 2012-07-22 MED ORDER — PREDNISONE 50 MG PO TABS
60.0000 mg | ORAL_TABLET | Freq: Once | ORAL | Status: AC
  Administered 2012-07-22: 60 mg via ORAL

## 2012-07-22 MED ORDER — DIPHENHYDRAMINE HCL 25 MG PO CAPS
50.0000 mg | ORAL_CAPSULE | Freq: Once | ORAL | Status: AC
  Administered 2012-07-22: 50 mg via ORAL
  Filled 2012-07-22: qty 2

## 2012-07-22 MED ORDER — PREDNISONE 10 MG PO TABS: 40.0000 mg | ORAL_TABLET | Freq: Every day | ORAL | Status: DC

## 2012-07-22 MED ORDER — FAMOTIDINE 20 MG PO TABS: 20.0000 mg | ORAL_TABLET | Freq: Two times a day (BID) | ORAL | Status: DC

## 2012-07-22 MED ORDER — DIPHENHYDRAMINE HCL 25 MG PO TABS: 25.0000 mg | ORAL_TABLET | Freq: Four times a day (QID) | ORAL | Status: DC

## 2012-07-22 NOTE — ED Provider Notes (Signed)
History  This chart was scribed for Margaret Jakes, MD by Bennett Scrape, ED Scribe. This patient was seen in room APA19/APA19 and the patient's care was started at 7:13 AM.  CSN: [id number]  Arrival date & time 07/22/12  0420   First MD Initiated Contact with Patient 07/22/12 [phone]     Chief Complaint  Patient presents with  . Allergic Reaction    Patient is a 79 y.o. female presenting with allergic reaction. The history is provided by the patient. No language interpreter was used.  Allergic Reaction Presenting symptoms: itching and rash   Severity:  Moderate Prior allergic episodes:  No prior episodes Context: no insect bite/sting, no medications and no new detergents/soaps   Relieved by:  Antihistamines and steroids Worsened by:  Nothing tried Ineffective treatments: alcohol.   HPI Comments: DUAA STELZNER is a 79 y.o. female who presents to the Emergency Department complaining of gradual onset, constant rash on bilateral arms, back, inguinal region that started around 7:30 PM last night. She states that the areas were itchy and red with the original onset but improved with prednisone and benadryl given in the ED. She reports using alcohol with no improvement. She denies any recent tick bites, changes in medications or changes in hygiene products. She denies tongue swelling, lip swelling or trouble breathing as associated symptoms.   PCP is Dr. Sherwood Gambler  Past Medical History  Diagnosis Date  . Osteoporosis   . Breast cancer   . Bilateral cataracts    Past Surgical History  Procedure Laterality Date  . Breast surgery    . Abdominal hysterectomy    . Mastectomy, partial    . Portacath placement    . Port-a-cath removal     Family History  Problem Relation Age of Onset  . Stroke Mother    History  Substance Use Topics  . Smoking status: Current Every Day Smoker    Types: Cigarettes  . Smokeless tobacco: Never Used  . Alcohol Use: No   No OB history  provided.  Review of Systems  Constitutional: Negative for fever and chills.  HENT: Negative for congestion, sore throat, facial swelling and neck pain.   Eyes: Negative for visual disturbance.  Respiratory: Negative for cough and shortness of breath.   Cardiovascular: Negative for chest pain and leg swelling.  Gastrointestinal: Negative for nausea, vomiting, abdominal pain and diarrhea.  Genitourinary: Negative for dysuria.  Musculoskeletal: Negative for back pain.  Skin: Positive for itching and rash.  Neurological: Negative for headaches.  Hematological: Bruises/bleeds easily.  Psychiatric/Behavioral: Negative for confusion.    Allergies  Azithromycin  Home Medications   Current Outpatient Rx  Name  Route  Sig  Dispense  Refill  . Ibuprofen (ADVIL) 200 MG CAPS   Oral   Take 2 capsules by mouth 2 (two) times daily as needed. Pain.         . ranitidine (ZANTAC) 150 MG tablet   Oral   Take 150 mg by mouth 2 (two) times daily as needed. Take 1/2 tablet two times daily as needed for Acid reflux.         . diphenhydrAMINE (BENADRYL) 25 MG tablet   Oral   Take 1 tablet (25 mg total) by mouth every 6 (six) hours.   20 tablet   0   . famotidine (PEPCID) 20 MG tablet   Oral   Take 1 tablet (20 mg total) by mouth 2 (two) times daily.   30 tablet   0   .  predniSONE (DELTASONE) 10 MG tablet   Oral   Take 4 tablets (40 mg total) by mouth daily.   20 tablet   0    Triage Vitals: BP 153/70  Pulse 64  Temp(Src) 98.2 F (36.8 C) (Oral)  Resp 18  Ht 5\' 2"  (1.575 m)  Wt 124 lb (56.246 kg)  BMI 22.67 kg/m2  SpO2 100%  Physical Exam  Nursing note and vitals reviewed. Constitutional: She is oriented to person, place, and time. She appears well-developed and well-nourished. No distress.  HENT:  Head: Normocephalic and atraumatic.  Mouth/Throat: Oropharynx is clear and moist.  Eyes: Conjunctivae and EOM are normal. Pupils are equal, round, and reactive to light.   Sclera are clear  Neck: Neck supple. No tracheal deviation present.  Cardiovascular: Normal rate and regular rhythm.   No murmur heard. Pulses:      Dorsalis pedis pulses are 2+ on the right side, and 2+ on the left side.  Pulmonary/Chest: Effort normal and breath sounds normal. No respiratory distress. She has no wheezes.  Abdominal: Soft. Bowel sounds are normal. She exhibits no distension. There is no tenderness.  Musculoskeletal: Normal range of motion. She exhibits no edema (no ankle swelling).  Lymphadenopathy:    She has no cervical adenopathy.  Neurological: She is alert and oriented to person, place, and time. No cranial nerve deficit.  Pt able to move both sets of fingers and toes  Skin: Skin is warm and dry. No rash noted.  Macular papular rash on bilateral arms, back and inguinal regions, no hives or vesicles, right axilla has hive-like rash  Psychiatric: She has a normal mood and affect. Her behavior is normal.    ED Course  Procedures (including critical care time)  DIAGNOSTIC STUDIES: Oxygen Saturation is 100% on room air, normal by my interpretation.    COORDINATION OF CARE: 7:48 AM-Discussed discharge plan which includes prescriptions for prednisone, benadryl and pepcid with pt and pt agreed to plan. Addressed symptoms to return for with pt.   Labs Reviewed - No data to display No results found.  1. Rash   2. Allergic reaction, initial encounter     MDM  Patient most likely allergic reaction to unknown source. No significant lip swelling tongue swelling or shortness of breath. Patient treated in the emergency department with prednisone Benadryl and Pepcid with some improvement by the time that I saw her. We'll continue these medications. Patient knows to return for trouble breathing a new or worse symptoms lip swelling or tongue swelling. Patient is nontoxic no acute distress.  I personally performed the services described in this documentation, which was  scribed in my presence. The recorded information has been reviewed and is accurate.     Margaret Jakes, MD 07/22/12 [phone]

## 2012-07-22 NOTE — Discharge Instructions (Signed)
Allergic Reaction Allergic reactions can be caused by anything your body is sensitive to. Your body may be sensitive to food, medicines, molds, pollens, cockroaches, dust mites, pets, insect stings, and other things around you. An allergic reaction may cause puffiness (swelling), itching, sneezing, coughing, or problems breathing.  Allergies cannot be cured, but they can be controlled with medicine. Some allergies happen only at certain times of the year. Try to stay away from what causes your reaction if possible. Sometimes, it is hard to tell what causes your reaction. HOME CARE If you have a rash or red patches (hives) on your skin:  Take medicines as told by your doctor.   Do not drive or drink alcohol after taking medicines. They can make you sleepy.   Put cold cloths on your skin. Take baths in cool water. This will help your itching. Do not take hot baths or showers. Heat will make the itching worse.   If your allergies get worse, your doctor might give you other medicines. Talk to your doctor if problems continue.  GET HELP RIGHT AWAY IF:   You have trouble breathing.   You have a tight feeling in your chest or throat.   Your mouth gets puffy (swollen).   You have red, itchy patches on your skin (hives) that get worse.   You have itching all over your body.  MAKE SURE YOU:   Understand these instructions.   Will watch your condition.   Will get help right away if you are not doing well or get worse.  Document Released: 12/18/2008 Document Revised: 12/19/2010 Document Reviewed: 12/18/2008 [facility] Patient Information 2012 [place], [place].  Take the Benadryl every 6 hours for the next 2 days. Take the Pepcid for the next 7 days. Take prednisone for the next 5 days. Return for any newer worse symptoms. Followup with your doctor as needed.

## 2012-07-22 NOTE — ED Notes (Signed)
Denies any new contact with soaps, detergents, foods, pets, meds etc   States she did remove a tick about two weeks ago. Denies headache, nausea, pain etc. " just itches"

## 2012-07-22 NOTE — ED Notes (Signed)
Rash on both arms, in groin area, on torso, thinks she may have gotten "bit by something"

## 2012-08-29 ENCOUNTER — Emergency Department (HOSPITAL_COMMUNITY): Payer: Medicare Other

## 2012-08-29 ENCOUNTER — Emergency Department (HOSPITAL_COMMUNITY)
Admission: EM | Admit: 2012-08-29 | Discharge: 2012-08-29 | Disposition: A | Discharge status: Home / Self Care | Payer: Medicare Other | Attending: Emergency Medicine | Admitting: Emergency Medicine

## 2012-08-29 ENCOUNTER — Encounter (HOSPITAL_COMMUNITY): Payer: Self-pay | Admitting: Emergency Medicine

## 2012-08-29 DIAGNOSIS — S40012A Contusion of left shoulder, initial encounter: Secondary | ICD-10-CM

## 2012-08-29 DIAGNOSIS — Z853 Personal history of malignant neoplasm of breast: Secondary | ICD-10-CM | POA: Insufficient documentation

## 2012-08-29 DIAGNOSIS — S20219A Contusion of unspecified front wall of thorax, initial encounter: Secondary | ICD-10-CM | POA: Insufficient documentation

## 2012-08-29 DIAGNOSIS — Z9889 Other specified postprocedural states: Secondary | ICD-10-CM | POA: Insufficient documentation

## 2012-08-29 DIAGNOSIS — S40019A Contusion of unspecified shoulder, initial encounter: Secondary | ICD-10-CM | POA: Insufficient documentation

## 2012-08-29 DIAGNOSIS — S20211A Contusion of right front wall of thorax, initial encounter: Secondary | ICD-10-CM

## 2012-08-29 DIAGNOSIS — Z8669 Personal history of other diseases of the nervous system and sense organs: Secondary | ICD-10-CM | POA: Insufficient documentation

## 2012-08-29 DIAGNOSIS — Z8739 Personal history of other diseases of the musculoskeletal system and connective tissue: Secondary | ICD-10-CM | POA: Insufficient documentation

## 2012-08-29 DIAGNOSIS — W010XXA Fall on same level from slipping, tripping and stumbling without subsequent striking against object, initial encounter: Secondary | ICD-10-CM | POA: Insufficient documentation

## 2012-08-29 DIAGNOSIS — Y9289 Other specified places as the place of occurrence of the external cause: Secondary | ICD-10-CM | POA: Insufficient documentation

## 2012-08-29 DIAGNOSIS — S2000XA Contusion of breast, unspecified breast, initial encounter: Secondary | ICD-10-CM | POA: Insufficient documentation

## 2012-08-29 DIAGNOSIS — Y9389 Activity, other specified: Secondary | ICD-10-CM | POA: Insufficient documentation

## 2012-08-29 DIAGNOSIS — F17200 Nicotine dependence, unspecified, uncomplicated: Secondary | ICD-10-CM | POA: Insufficient documentation

## 2012-08-29 NOTE — ED Notes (Signed)
Pt states tripped and fell yesterday. Fell onto chest. Denies hitting head. C/o pain to chest and left shoulder. Nad. Ambulating well. Rates pain 5. Denies sob.

## 2012-09-01 NOTE — ED Provider Notes (Signed)
CSN: [id number]     Arrival date & time 08/29/12  1357 History     First MD Initiated Contact with Patient 08/29/12 1429     Chief Complaint  Patient presents with  . Fall   (Consider location/radiation/quality/duration/timing/severity/associated sxs/prior Treatment) HPI Comments: Margaret Thompson is a 79 y.o. female who presents to the Emergency Department complaining of "soreness" to her right breast and left shoulder after a fall.  States she tripped and fell forward on the ground.  C/o feeling "soreness" to the right breast with palpation and movement.  Also c/o left shoulder pain with movement. States she put her hand out to try to break the fall.  She denies sx's prior to the fall, shortness of breath, abd pain, dizziness, visual changes, headaches, vomiting, neck or back pain, head injury or LOC.  Reports significant improvement with Motrin  Patient is a 79 y.o. female presenting with fall. The history is provided by the patient.  Fall This is a new problem. Episode onset: on the day prior to ED arrival. The problem occurs constantly. The problem has been gradually improving. Associated symptoms include arthralgias and chest pain. Pertinent negatives include no abdominal pain, chills, congestion, coughing, diaphoresis, fatigue, fever, headaches, joint swelling, myalgias, nausea, neck pain, numbness, rash, sore throat, swollen glands, vertigo, visual change, vomiting or weakness. Associated symptoms comments: Right breast pain and bruising.  . The symptoms are aggravated by twisting and bending. She has tried NSAIDs for the symptoms. The treatment provided moderate relief.    Past Medical History  Diagnosis Date  . Osteoporosis   . Breast cancer   . Bilateral cataracts    Past Surgical History  Procedure Laterality Date  . Breast surgery    . Abdominal hysterectomy    . Mastectomy, partial    . Portacath placement    . Port-a-cath removal     Family History  Problem Relation Age of  Onset  . Stroke Mother    History  Substance Use Topics  . Smoking status: Current Every Day Smoker    Types: Cigarettes  . Smokeless tobacco: Never Used  . Alcohol Use: No   OB History   Grav Para Term Preterm Abortions TAB SAB Ect Mult Living                 Review of Systems  Constitutional: Negative for fever, chills, diaphoresis, activity change, appetite change and fatigue.  HENT: Negative for congestion, sore throat and neck pain.   Eyes: Negative for visual disturbance.  Respiratory: Negative for apnea, cough, chest tightness, shortness of breath and wheezing.   Cardiovascular: Positive for chest pain.       Right breast pain  Gastrointestinal: Negative for nausea, vomiting and abdominal pain.  Genitourinary: Negative for hematuria, flank pain and pelvic pain.  Musculoskeletal: Positive for arthralgias. Negative for myalgias, back pain and joint swelling.  Skin: Negative for rash.       Bruising to right breast  Neurological: Negative for dizziness, vertigo, syncope, facial asymmetry, speech difficulty, weakness, light-headedness, numbness and headaches.  All other systems reviewed and are negative.    Allergies  Azithromycin  Home Medications   Current Outpatient Rx  Name  Route  Sig  Dispense  Refill  . Ibuprofen (ADVIL) 200 MG CAPS   Oral   Take 2 capsules by mouth 2 (two) times daily as needed. Pain.         . ranitidine (ZANTAC) 150 MG tablet   Oral  Take 150 mg by mouth 2 (two) times daily as needed. Take 1/2 tablet two times daily as needed for Acid reflux.          BP 148/75  Pulse 102  Temp(Src) 98.2 F (36.8 C) (Oral)  Resp 20  Ht 5\' 2"  (1.575 m)  Wt 124 lb (56.246 kg)  BMI 22.67 kg/m2  SpO2 97% Physical Exam  Nursing note and vitals reviewed. Constitutional: She is oriented to person, place, and time. She appears well-developed and well-nourished. No distress.  HENT:  Head: Normocephalic and atraumatic.  Eyes: Conjunctivae and EOM  are normal. Pupils are equal, round, and reactive to light.  Neck: Normal range of motion, full passive range of motion without pain and phonation normal. Neck supple. No spinous process tenderness and no muscular tenderness present. No edema and normal range of motion present.  Cardiovascular: Normal rate, regular rhythm, normal heart sounds and intact distal pulses.  Exam reveals no gallop and no friction rub.   No murmur heard. Pulmonary/Chest: Effort normal and breath sounds normal. No respiratory distress. She has no decreased breath sounds. She has no wheezes. She has no rales. She exhibits tenderness. Right breast exhibits tenderness. Right breast exhibits no inverted nipple, no mass and no nipple discharge. Left breast exhibits no inverted nipple, no mass and no nipple discharge. Breasts are symmetrical.    No sternal tenderness  Abdominal: Soft. Normal appearance and bowel sounds are normal. There is no tenderness. There is no guarding.  Musculoskeletal: Normal range of motion. She exhibits no edema.       Left shoulder: She exhibits tenderness. She exhibits normal range of motion, no bony tenderness, no swelling, no effusion, no crepitus, no deformity, no laceration, normal pulse and normal strength.  Mild ttp of the anterior left shoulder.  No step-off deformity, edema, or erythema.  Pt has full ROM of the shoulder.  Radial pulse brisk, distal sensation intact.  Pt has full ROM of the shoulder, grip strength strong and equal bilaterally.  No spinal or pelvic tenderness  Lymphadenopathy:    She has no cervical adenopathy.  Neurological: She is alert and oriented to person, place, and time. She has normal strength. No cranial nerve deficit or sensory deficit. She exhibits normal muscle tone. Coordination and gait normal. GCS eye subscore is 4. GCS verbal subscore is 5. GCS motor subscore is 6.  Skin: Skin is warm and dry.  Psychiatric: She has a normal mood and affect. Thought content  normal.    ED Course   Procedures (including critical care time)  Labs Reviewed - No data to display No results found.  Dg Chest 2 View  08/29/2012   *RADIOLOGY REPORT*  Clinical Data: Fall.  Chest pain.  Shoulder pain.  CHEST - 2 VIEW  Comparison: 09/15/2010.  Findings: No displaced or depressed sternal fracture is identified. Thoracic alignment shows a mildly exaggerated kyphosis.  Chronic bronchitic changes are present along with bilateral pleural apical scarring.  Emphysema with flattening of the hemidiaphragms.  Aortic arch atherosclerosis.  IMPRESSION: Emphysema and chronic bronchitic change without acute abnormality.   Original Report Authenticated By: Andreas Newport, M.D.   Dg Shoulder Left  08/29/2012   *RADIOLOGY REPORT*  Clinical Data: Fall.  Left shoulder pain.  LEFT SHOULDER - 2+ VIEW  Comparison: 02/04/2012.  Findings: The shoulder is located.  There is no fracture identified.  Mild glenohumeral osteoarthritis.  AC joint appears within normal limits.  Surgical clips are present in the left axilla.  Suboptimal axillary Y view due to projection.  IMPRESSION: No acute abnormality.   Original Report Authenticated By: Andreas Newport, M.D.   1. Contusion, chest wall, right, initial encounter   2. Contusion of left shoulder, initial encounter     MDM   VSS.  Patient is active and alert.  Sitting on stretcher talking and laughing with friend at bedside.  Localized tenderness to right anterior rib and right breast.  Has a quarter sized are of bruising ot the breast w/o hematoma.  No spinal tenderness, full ROM of the bilateral hips, ambulates with a steady gait.  No hx of evidence of head injury or LOC.    Pt appears stable for discharge.  I have gave return precautions and pt agrees to f/u with her PMD in 1-2 days for recheck and to otherwise return here for any worsening symptoms.  She verbalized understanding and agreed to care plan.  Offered script for pain, but pt states her pain is  controlled well with ibuprofen     Jacqualine Weichel L. Lorrena Goranson, PA-C 09/01/12 1603

## 2012-09-05 NOTE — ED Provider Notes (Signed)
Medical screening examination/treatment/procedure(s) were performed by non-physician practitioner and as supervising physician I was immediately available for consultation/collaboration.  Derwood Kaplan, MD 09/05/12 2051

## 2012-11-23 ENCOUNTER — Encounter (HOSPITAL_COMMUNITY): Payer: Self-pay | Admitting: Oncology

## 2012-11-23 DIAGNOSIS — Z853 Personal history of malignant neoplasm of breast: Secondary | ICD-10-CM | POA: Insufficient documentation

## 2012-11-23 DIAGNOSIS — C50912 Malignant neoplasm of unspecified site of left female breast: Secondary | ICD-10-CM

## 2012-11-23 HISTORY — DX: Malignant neoplasm of unspecified site of left female breast: C50.912

## 2012-11-23 NOTE — Progress Notes (Signed)
Margaret Thompson., MD [address] [place] [address]  Adenocarcinoma of left breast  CURRENT THERAPY: Surveillance per NCCN guidelines.   INTERVAL HISTORY: Margaret Thompson 79 y.o. female returns for  regular  visit for followup of Stage II (T1 C. N1 M0) adenocarcinoma of the left breast with surgery on 03/08/2001. She had a 1.5 cm cancer with 1/6 positive sentinel nodes. Reexcision margins were clear. She did participate in NSABP B.-30 randomized to Adriamycin and Taxotere for 4 cycles followed by radiation therapy from 07/13/2001 to 08/27/2001. She then started Arimidex on 07/07/2001 took that until the end of June 2008. Thus far she has no evidence for recurrence.  I personally reviewed and went over radiographic studies with the patient.  Last mammogram in Feb 2014 was BIRADS 1 and she will be due for her next screening mammogram in Feb 2015.   We reviewed the NCCN guidelines for surveillance for her malignancy.  NCCN guidelines recommends the following surveillance for invasive breast cancer:  A. History and Physical exam every 4-6 months for 5 years and then every 12 months.  B. Mammography every 12 months  C. Women on Tamoxifen: annual gynecologic assessment every 12 months if uterus is present.  D. Women on aromatase inhibitor or who experience ovarian failure secondary to treatment should have monitoring of bone health with a bone mineral density determination at baseline and periodically thereafter.  E. Assess and encourage adherence to adjuvant endocrine therapy.  F. Evidence suggests that active lifestyle and achieving and maintaining an ideal body weight (20-25 BMI) may lead to optimal breast cancer outcomes.   She denies any complaints and Oncologic ROS questioning is negative. She continues to smoke 1/2/ ppd and smoking cessation education was provided today.   Past Medical History  Diagnosis Date  . Osteoporosis   . Breast cancer   . Bilateral  cataracts   . Adenocarcinoma of left breast 11/23/2012    Stage II (T1 C. N1 M0) adenocarcinoma of the left breast with surgery on 03/08/2001. She had a 1.5 cm cancer with 1/6 positive sentinel nodes. Reexcision margins were clear. She did participate in NSABP B.-30 randomized to Adriamycin and Taxotere for 4 cycles followed by radiation therapy from 07/13/2001 to 08/27/2001. She then started Arimidex on 07/07/2001 took that until the end of June 2008. T  . GERD (gastroesophageal reflux disease)     has Bursitis, shoulder and Adenocarcinoma of left breast on her problem list.     is allergic to azithromycin.  Margaret Thompson does not currently have medications on file.  Past Surgical History  Procedure Laterality Date  . Breast surgery    . Abdominal hysterectomy    . Mastectomy, partial    . Portacath placement    . Port-a-cath removal      Denies any headaches, dizziness, double vision, fevers, chills, night sweats, nausea, vomiting, diarrhea, constipation, chest pain, heart palpitations, shortness of breath, blood in stool, black tarry stool, urinary pain, urinary burning, urinary frequency, hematuria.   PHYSICAL EXAMINATION  ECOG PERFORMANCE STATUS: 1 - Symptomatic but completely ambulatory  Filed Vitals:   11/24/12 1452  BP: 129/82  Pulse: 110  Temp: 98.2 F (36.8 C)  Resp: 20    GENERAL:alert, no distress, comfortable, cooperative, chronically-ill looking and smiling SKIN: skin color, texture, turgor are normal, no rashes or significant lesions.  Thickened skin on face from smoking. HEAD: Normocephalic, No masses, lesions, tenderness or abnormalities EYES: normal, PERRLA, EOMI, Conjunctiva  are pink and non-injected EARS: External ears normal OROPHARYNX:mucous membranes are moist  NECK: supple, no adenopathy, thyroid normal size, non-tender, without nodularity, no stridor, non-tender, trachea midline LYMPH:  no palpable lymphadenopathy, no hepatosplenomegaly BREAST:right  breast normal without mass, skin or nipple changes or axillary nodes, left breast normal without mass, skin or nipple changes or axillary nodes, but with some thickened tissue. LUNGS: clear to auscultation and percussion, decreased breath sounds HEART: regular rate & rhythm, no murmurs, no gallops, S1 normal and S2 normal ABDOMEN:abdomen soft, non-tender, normal bowel sounds, no masses or organomegaly and no hepatosplenomegaly BACK: Back symmetric, no curvature., No CVA tenderness EXTREMITIES:less then 2 second capillary refill, no joint deformities, effusion, or inflammation, no edema, no skin discoloration, positive findings:  Clubbing of fingernails  NEURO: alert & oriented x 3 with fluent speech, no focal motor/sensory deficits, gait normal    RADIOGRAPHIC STUDIES:  02/27/2012  *RADIOLOGY REPORT*  Clinical Data: Screening.  DIGITAL BILATERAL SCREENING MAMMOGRAM WITH CAD  Comparison: Previous exams.  FINDINGS:  ACR Breast Density Category 2: There is a scattered fibroglandular  pattern.  No suspicious masses, architectural distortion, or calcifications  are present.  Images were processed with CAD.  IMPRESSION:  No mammographic evidence of malignancy.  A result letter of this screening mammogram will be mailed directly  to the patient.  RECOMMENDATION:  Screening mammogram in one year. (Code:SM-B-01Y)  BI-RADS CATEGORY 1: Negative.  Original Report Authenticated By: Esperanza Heir, M.D.    ASSESSMENT:  1. Stage II (T1 C. N1 M0) adenocarcinoma of the left breast with surgery on 03/08/2001. She had a 1.5 cm cancer with 1/6 positive sentinel nodes. Reexcision margins were clear. She did participate in NSABP B.-30 randomized to Adriamycin and Taxotere for 4 cycles followed by radiation therapy from 07/13/2001 to 08/27/2001. She then started Arimidex on 07/07/2001 took that until the end of June 2008. Thus far she has no evidence for recurrence  2. Neurodermatitis  3. Skin lesion on  the right ear  4. Dry skin  5. COPD still smoking half a pack of cigarettes a day. She has been a very heavy smoker her life.  6. Zithromax allergy-urticaria  7. Tonsillectomy at age 3  8. Osteoporosis on therapy  Patient Active Problem List   Diagnosis Date Noted  . Adenocarcinoma of left breast 11/23/2012  . Bursitis, shoulder 05/25/2012    PLAN:  1. I personally reviewed and went over laboratory results with the patient. 2. I personally reviewed and went over radiographic studies with the patient. 3. Next screening mammogram is due in Feb 2015.  4. Smoking cessation education provided 5. No need for labs today 6. Influenza vaccine given at outside facility this year. Pneumovax vaccine declined today 7. Return in 1 year for follow-up   THERAPY PLAN:  NCCN guidelines recommends the following surveillance for invasive breast cancer:  A. History and Physical exam every 4-6 months for 5 years and then every 12 months.  B. Mammography every 12 months  C. Women on Tamoxifen: annual gynecologic assessment every 12 months if uterus is present.  D. Women on aromatase inhibitor or who experience ovarian failure secondary to treatment should have monitoring of bone health with a bone mineral density determination at baseline and periodically thereafter.  E. Assess and encourage adherence to adjuvant endocrine therapy.  F. Evidence suggests that active lifestyle and achieving and maintaining an ideal body weight (20-25 BMI) may lead to optimal breast cancer outcomes.   All questions were answered. The  patient knows to call the clinic with any problems, questions or concerns. We can certainly see the patient much sooner if necessary.  Patient and plan discussed with Dr. Annamarie Dawley and she is in agreement with the aforementioned.    Marjorie Lussier

## 2012-11-24 ENCOUNTER — Encounter (HOSPITAL_COMMUNITY): Payer: Self-pay | Admitting: Oncology

## 2012-11-24 ENCOUNTER — Encounter (HOSPITAL_COMMUNITY): Payer: Medicare Other | Attending: Oncology | Admitting: Oncology

## 2012-11-24 VITALS — BP 129/82 | HR 110 | Temp 98.2°F | Resp 20 | Wt 123.0 lb

## 2012-11-24 DIAGNOSIS — M810 Age-related osteoporosis without current pathological fracture: Secondary | ICD-10-CM

## 2012-11-24 DIAGNOSIS — F17200 Nicotine dependence, unspecified, uncomplicated: Secondary | ICD-10-CM

## 2012-11-24 DIAGNOSIS — Z853 Personal history of malignant neoplasm of breast: Secondary | ICD-10-CM

## 2012-11-24 DIAGNOSIS — C50912 Malignant neoplasm of unspecified site of left female breast: Secondary | ICD-10-CM

## 2012-11-24 NOTE — Patient Instructions (Signed)
[facility] Discharge Instructions  RECOMMENDATIONS MADE BY THE CONSULTANT AND ANY TEST RESULTS WILL BE SENT TO YOUR REFERRING PHYSICIAN.  EXAM FINDINGS BY THE PHYSICIAN TODAY AND SIGNS OR SYMPTOMS TO REPORT TO CLINIC OR PRIMARY PHYSICIAN: Exam and findings as discussed by Margaret Anes, PA-C.  No evidence of recurrence by exam.  Report any new lumps, bone pain, shortness of breath or other symptoms.  NCCN guidelines discussed.  MEDICATIONS PRESCRIBED:  none  INSTRUCTIONS/FOLLOW-UP: Follow-up in 1 year.  Thank you for choosing [facility] to provide your oncology and hematology care.  To afford each patient quality time with our providers, please arrive at least 15 minutes before your scheduled appointment time.  With your help, our goal is to use those 15 minutes to complete the necessary work-up to ensure our physicians have the information they need to help with your evaluation and healthcare recommendations.    Effective January 1st, 2014, we ask that you re-schedule your appointment with our physicians should you arrive 10 or more minutes late for your appointment.  We strive to give you quality time with our providers, and arriving late affects you and other patients whose appointments are after yours.    Again, thank you for choosing [facility].  Our hope is that these requests will decrease the amount of time that you wait before being seen by our physicians.       _____________________________________________________________  Should you have questions after your visit to [facility], please contact our office at [phone] between the hours of 8:30 a.m. and 5:00 p.m.  Voicemails left after 4:30 p.m. will not be returned until the following business day.  For prescription refill requests, have your pharmacy contact our office with your prescription refill request.

## 2012-12-21 ENCOUNTER — Encounter (HOSPITAL_COMMUNITY): Payer: Self-pay | Admitting: Emergency Medicine

## 2012-12-21 ENCOUNTER — Inpatient Hospital Stay (HOSPITAL_COMMUNITY)
Admission: EM | Admit: 2012-12-21 | Discharge: 2012-12-24 | DRG: 247 | Disposition: A | Discharge status: Home / Self Care | Payer: Medicare Other | Attending: Cardiology | Admitting: Cardiology

## 2012-12-21 ENCOUNTER — Ambulatory Visit (HOSPITAL_COMMUNITY): Admit: 2012-12-21 | Payer: Self-pay | Admitting: Cardiology

## 2012-12-21 ENCOUNTER — Emergency Department (HOSPITAL_COMMUNITY): Payer: Medicare Other

## 2012-12-21 ENCOUNTER — Encounter (HOSPITAL_COMMUNITY)
Admission: EM | Disposition: A | Discharge status: Home / Self Care | Payer: Medicare Other | Source: Home / Self Care | Attending: Cardiology

## 2012-12-21 DIAGNOSIS — I2119 ST elevation (STEMI) myocardial infarction involving other coronary artery of inferior wall: Principal | ICD-10-CM | POA: Diagnosis present

## 2012-12-21 DIAGNOSIS — Z853 Personal history of malignant neoplasm of breast: Secondary | ICD-10-CM

## 2012-12-21 DIAGNOSIS — I4729 Other ventricular tachycardia: Secondary | ICD-10-CM | POA: Diagnosis not present

## 2012-12-21 DIAGNOSIS — Z79899 Other long term (current) drug therapy: Secondary | ICD-10-CM

## 2012-12-21 DIAGNOSIS — K219 Gastro-esophageal reflux disease without esophagitis: Secondary | ICD-10-CM | POA: Diagnosis present

## 2012-12-21 DIAGNOSIS — M810 Age-related osteoporosis without current pathological fracture: Secondary | ICD-10-CM | POA: Diagnosis present

## 2012-12-21 DIAGNOSIS — F17200 Nicotine dependence, unspecified, uncomplicated: Secondary | ICD-10-CM | POA: Diagnosis present

## 2012-12-21 DIAGNOSIS — I472 Ventricular tachycardia, unspecified: Secondary | ICD-10-CM | POA: Diagnosis not present

## 2012-12-21 DIAGNOSIS — J4489 Other specified chronic obstructive pulmonary disease: Secondary | ICD-10-CM | POA: Diagnosis present

## 2012-12-21 DIAGNOSIS — E7800 Pure hypercholesterolemia, unspecified: Secondary | ICD-10-CM | POA: Diagnosis present

## 2012-12-21 DIAGNOSIS — Z955 Presence of coronary angioplasty implant and graft: Secondary | ICD-10-CM

## 2012-12-21 DIAGNOSIS — Z8673 Personal history of transient ischemic attack (TIA), and cerebral infarction without residual deficits: Secondary | ICD-10-CM

## 2012-12-21 DIAGNOSIS — J449 Chronic obstructive pulmonary disease, unspecified: Secondary | ICD-10-CM | POA: Diagnosis present

## 2012-12-21 DIAGNOSIS — I2510 Atherosclerotic heart disease of native coronary artery without angina pectoris: Secondary | ICD-10-CM | POA: Diagnosis present

## 2012-12-21 HISTORY — PX: LEFT HEART CATHETERIZATION WITH CORONARY ANGIOGRAM: SHX5451

## 2012-12-21 LAB — COMPREHENSIVE METABOLIC PANEL
ALT: 8 U/L (ref 0–35)
AST: 16 U/L (ref 0–37)
Calcium: 9.4 mg/dL (ref 8.4–10.5)
Chloride: 99 mEq/L (ref 96–112)
Creatinine, Ser: 0.9 mg/dL (ref 0.50–1.10)
GFR calc non Af Amer: 60 mL/min — ABNORMAL LOW (ref >90)
Glucose, Bld: 170 mg/dL — ABNORMAL HIGH (ref 70–99)
Sodium: 137 mEq/L (ref 135–145)
Total Bilirubin: 0.2 mg/dL — ABNORMAL LOW (ref 0.3–1.2)
Total Protein: 7.4 g/dL (ref 6.0–8.3)

## 2012-12-21 LAB — CBC
MCH: 31.7 pg (ref 26.0–34.0)
MCHC: 33.2 g/dL (ref 30.0–36.0)
Platelets: 271 10*3/uL (ref 150–400)
RBC: 4.54 MIL/uL (ref 3.87–5.11)
RDW: 12.8 % (ref 11.5–15.5)

## 2012-12-21 LAB — POCT I-STAT TROPONIN I: Troponin i, poc: 0 ng/mL (ref 0.00–0.08)

## 2012-12-21 LAB — PROTIME-INR
INR: 0.96 (ref 0.00–1.49)
Prothrombin Time: 12.6 seconds (ref 11.6–15.2)
Prothrombin Time: 15.7 seconds — ABNORMAL HIGH (ref 11.6–15.2)

## 2012-12-21 LAB — TROPONIN I: Troponin I: >20 ng/mL (ref <0.30)

## 2012-12-21 SURGERY — LEFT HEART CATHETERIZATION WITH CORONARY ANGIOGRAM
Anesthesia: LOCAL

## 2012-12-21 MED ORDER — TICAGRELOR 90 MG PO TABS
90.0000 mg | ORAL_TABLET | Freq: Two times a day (BID) | ORAL | Status: DC
  Administered 2012-12-22 – 2012-12-24 (×5): 90 mg via ORAL
  Filled 2012-12-21 (×6): qty 1

## 2012-12-21 MED ORDER — METOPROLOL TARTRATE 12.5 MG HALF TABLET
12.5000 mg | ORAL_TABLET | Freq: Two times a day (BID) | ORAL | Status: DC
  Administered 2012-12-21 – 2012-12-22 (×2): 12.5 mg via ORAL
  Filled 2012-12-21 (×3): qty 1

## 2012-12-21 MED ORDER — ONDANSETRON HCL 4 MG/2ML IJ SOLN: 4.0000 mg | Freq: Four times a day (QID) | INTRAMUSCULAR | Status: DC | PRN

## 2012-12-21 MED ORDER — PANTOPRAZOLE SODIUM 40 MG PO TBEC
40.0000 mg | DELAYED_RELEASE_TABLET | Freq: Every day | ORAL | Status: DC
  Administered 2012-12-22 – 2012-12-24 (×3): 40 mg via ORAL
  Filled 2012-12-21 (×3): qty 1

## 2012-12-21 MED ORDER — ACETAMINOPHEN 325 MG PO TABS: 650.0000 mg | ORAL_TABLET | ORAL | Status: DC | PRN

## 2012-12-21 MED ORDER — CEFAZOLIN SODIUM-DEXTROSE 2-3 GM-% IV SOLR
2.0000 g | INTRAVENOUS | Status: AC
  Filled 2012-12-21: qty 50

## 2012-12-21 MED ORDER — ASPIRIN 81 MG PO CHEW
324.0000 mg | CHEWABLE_TABLET | ORAL | Status: AC
  Administered 2012-12-21: 324 mg via ORAL
  Filled 2012-12-21: qty 4

## 2012-12-21 MED ORDER — SODIUM CHLORIDE 0.9 % IV SOLN: 1000.0000 mL | INTRAVENOUS | Status: DC

## 2012-12-21 MED ORDER — HEPARIN (PORCINE) IN NACL 2-0.9 UNIT/ML-% IJ SOLN
INTRAMUSCULAR | Status: AC
  Filled 2012-12-21: qty 1500

## 2012-12-21 MED ORDER — FENTANYL CITRATE 0.05 MG/ML IJ SOLN
INTRAMUSCULAR | Status: AC
  Filled 2012-12-21: qty 2

## 2012-12-21 MED ORDER — FENTANYL CITRATE 0.05 MG/ML IJ SOLN
25.0000 ug | INTRAMUSCULAR | Status: DC | PRN
  Administered 2012-12-21: 25 ug via INTRAVENOUS

## 2012-12-21 MED ORDER — NITROGLYCERIN IN D5W 200-5 MCG/ML-% IV SOLN
5.0000 ug/min | INTRAVENOUS | Status: DC
  Administered 2012-12-21: 5 ug/min via INTRAVENOUS
  Filled 2012-12-21: qty 250

## 2012-12-21 MED ORDER — METOPROLOL TARTRATE 1 MG/ML IV SOLN
INTRAVENOUS | Status: AC
  Filled 2012-12-21: qty 5

## 2012-12-21 MED ORDER — TICAGRELOR 90 MG PO TABS
90.0000 mg | ORAL_TABLET | Freq: Two times a day (BID) | ORAL | Status: DC
  Filled 2012-12-21: qty 1

## 2012-12-21 MED ORDER — ASPIRIN 81 MG PO CHEW: 81.0000 mg | CHEWABLE_TABLET | Freq: Every day | ORAL | Status: DC

## 2012-12-21 MED ORDER — BIVALIRUDIN 250 MG IV SOLR
INTRAVENOUS | Status: AC
  Filled 2012-12-21: qty 250

## 2012-12-21 MED ORDER — SODIUM CHLORIDE 0.9 % IV SOLN
INTRAVENOUS | Status: AC
  Administered 2012-12-21: 150 mL/h via INTRAVENOUS

## 2012-12-21 MED ORDER — NITROGLYCERIN 0.4 MG SL SUBL
SUBLINGUAL_TABLET | SUBLINGUAL | Status: AC
  Administered 2012-12-21: 0.4 mg
  Filled 2012-12-21: qty 25

## 2012-12-21 MED ORDER — HEPARIN SODIUM (PORCINE) 5000 UNIT/ML IJ SOLN
INTRAMUSCULAR | Status: AC
  Administered 2012-12-21: 4000 [IU] via INTRAVENOUS
  Filled 2012-12-21: qty 1

## 2012-12-21 MED ORDER — TICAGRELOR 90 MG PO TABS
ORAL_TABLET | ORAL | Status: AC
  Filled 2012-12-21: qty 2

## 2012-12-21 MED ORDER — NITROGLYCERIN 0.2 MG/ML ON CALL CATH LAB
INTRAVENOUS | Status: AC
  Filled 2012-12-21: qty 1

## 2012-12-21 MED ORDER — ATORVASTATIN CALCIUM 80 MG PO TABS
80.0000 mg | ORAL_TABLET | Freq: Every day | ORAL | Status: DC
  Administered 2012-12-22 – 2012-12-24 (×3): 80 mg via ORAL
  Filled 2012-12-21 (×4): qty 1

## 2012-12-21 MED ORDER — DOPAMINE-DEXTROSE 3.2-5 MG/ML-% IV SOLN
INTRAVENOUS | Status: AC
  Filled 2012-12-21: qty 250

## 2012-12-21 MED ORDER — LIDOCAINE HCL (PF) 1 % IJ SOLN
INTRAMUSCULAR | Status: AC
  Filled 2012-12-21: qty 30

## 2012-12-21 MED ORDER — NITROGLYCERIN 0.4 MG SL SUBL: 0.4000 mg | SUBLINGUAL_TABLET | SUBLINGUAL | Status: DC | PRN

## 2012-12-21 MED ORDER — SODIUM CHLORIDE 0.9 % IV SOLN: 1000.0000 mL | Freq: Once | INTRAVENOUS | Status: DC

## 2012-12-21 MED ORDER — ASPIRIN 300 MG RE SUPP
300.0000 mg | RECTAL | Status: AC
  Filled 2012-12-21: qty 1

## 2012-12-21 MED ORDER — TICAGRELOR 90 MG PO TABS
180.0000 mg | ORAL_TABLET | Freq: Once | ORAL | Status: AC
  Administered 2012-12-21: 180 mg via ORAL
  Filled 2012-12-21: qty 2

## 2012-12-21 MED ORDER — ASPIRIN EC 81 MG PO TBEC
81.0000 mg | DELAYED_RELEASE_TABLET | Freq: Every day | ORAL | Status: DC
  Administered 2012-12-22 – 2012-12-24 (×3): 81 mg via ORAL
  Filled 2012-12-21 (×3): qty 1

## 2012-12-21 MED ORDER — MIDAZOLAM HCL 2 MG/2ML IJ SOLN
INTRAMUSCULAR | Status: AC
  Filled 2012-12-21: qty 2

## 2012-12-21 NOTE — ED Notes (Signed)
Pt started having chest pain to mid upper abdominal area and pain that radiated down both arms.  Pt arrived with 10/10 pain.  Pt had 324mg  basa and 4mg  Zofran en route to ed

## 2012-12-21 NOTE — ED Notes (Signed)
Transported to Cath Lab via cathlab staff on zoll

## 2012-12-21 NOTE — ED Provider Notes (Signed)
CSN: [id number]     Arrival date & time 12/21/12  1754 History   First MD Initiated Contact with Patient 12/21/12 1800     Chief Complaint  Patient presents with  . Code STEMI   (Consider location/radiation/quality/duration/timing/severity/associated sxs/prior Treatment) HPI  Patient presents via EMS as a code STEMI. I reviewed the EKG transmitted via telemetry from EMS.  Initial EKG notable for elevated ST changes in inferior leads.  On arrival, and discussed the patient's pain with her immediately. She states that approximately 2 hours prior to my evaluation she developed pain, the pain is substernal, epigastric, with radiation to both arms.  The pain is severe, indescribable. There is associated headache, nausea, but no vomiting. Syncope. Patient has no similar prior pain. She states that she was generally well until the onset of pain. She does smoke, but states that she is otherwise well. She is a notable history of breast cancer, currently thought to be remission   Past Medical History  Diagnosis Date  . Osteoporosis   . Breast cancer   . Bilateral cataracts   . Adenocarcinoma of left breast 11/23/2012    Stage II (T1 C. N1 M0) adenocarcinoma of the left breast with surgery on 03/08/2001. She had a 1.5 cm cancer with 1/6 positive sentinel nodes. Reexcision margins were clear. She did participate in NSABP B.-30 randomized to Adriamycin and Taxotere for 4 cycles followed by radiation therapy from 07/13/2001 to 08/27/2001. She then started Arimidex on 07/07/2001 took that until the end of June 2008. T  . GERD (gastroesophageal reflux disease)    Past Surgical History  Procedure Laterality Date  . Breast surgery    . Abdominal hysterectomy    . Mastectomy, partial    . Portacath placement    . Port-a-cath removal     Family History  Problem Relation Age of Onset  . Stroke Mother    History  Substance Use Topics  . Smoking status: Current Every Day Smoker -- 0.50  packs/day    Types: Cigarettes  . Smokeless tobacco: Never Used  . Alcohol Use: No   OB History   Grav Para Term Preterm Abortions TAB SAB Ect Mult Living                 Review of Systems  Constitutional:       Per HPI, otherwise negative  HENT:       Per HPI, otherwise negative  Respiratory:       Per HPI, otherwise negative  Cardiovascular:       Per HPI, otherwise negative  Gastrointestinal: Negative for vomiting.  Endocrine:       Negative aside from HPI  Genitourinary:       Neg aside from HPI   Musculoskeletal:       Per HPI, otherwise negative  Skin: Negative.   Neurological: Negative for syncope.      Allergies  Azithromycin  Home Medications   Current Outpatient Rx  Name  Route  Sig  Dispense  Refill  . FLUVIRIN INJ injection               . Ibuprofen (ADVIL) 200 MG CAPS   Oral   Take 2 capsules by mouth 2 (two) times daily as needed. Pain.         . ranitidine (ZANTAC) 150 MG tablet   Oral   Take 150 mg by mouth 2 (two) times daily as needed. Take 1/2 tablet two times daily as needed for  Acid reflux.         . triamcinolone cream (KENALOG) 0.1 %                BP 147/70  Pulse 84  Resp 15  SpO2 97% Physical Exam  Nursing note and vitals reviewed. Constitutional: She is oriented to person, place, and time. She appears well-developed and well-nourished. She has a sickly appearance.  HENT:  Head: Normocephalic and atraumatic.  Eyes: Conjunctivae and EOM are normal.  Cardiovascular: Normal rate and regular rhythm.   Symmetric pulses in both upper extremities.  Pulmonary/Chest: Effort normal and breath sounds normal. No stridor. No respiratory distress.  Abdominal: She exhibits no distension.  Musculoskeletal: She exhibits no edema.  Neurological: She is alert and oriented to person, place, and time. She displays no atrophy and no tremor. No cranial nerve deficit. She exhibits normal muscle tone. She displays no seizure activity.   Patient had symmetric strength in both upper extremities, appropriate sensation in both upper extremities.   Skin: Skin is warm and dry.  Psychiatric: She has a normal mood and affect.    ED Course  Procedures (including critical care time) Labs Review Labs Reviewed  PRO B NATRIURETIC PEPTIDE  CBC  COMPREHENSIVE METABOLIC PANEL  PROTIME-INR   Imaging Review No results found.  EKG Interpretation   None      EMSEKG notable for ST elevation in 23, aVF, rate 50, abnormal   On monitor here the patient has ST changes, rate 70, abnormal  With the patient's description of pain rating M. of arms, though she has symmetric pulses, strength, emergent chest x-ray was performed to rule out widened mediastinum/aortic dissection.  This was reviewed, and the patient's was started on heparin.   EKG here notable for ST elevations in the inferior leads  Rate 67, abnormal  After the initial evaluation, reviewing of the x-ray, initiation of heparin, analgesia, and discussed the patient's case with our cardiologist.  Patient will be taken to catheterization lab for further evaluation and management.   MDM  No diagnosis found. Presents with chest pain.  After initially discussing this with EMS, the patient arrived, and was found to still have CP, with ECG changes concerning for STEMI.  After eval for possible aortic disruption, with reassuring CXR, we initiated heparin. The patient was then taken to the cath lab for further E/M.  CRITICAL CARE Performed by: Gerhard Munch Total critical care time: 35 Critical care time was exclusive of separately billable procedures and treating other patients. Critical care was necessary to treat or prevent imminent or life-threatening deterioration. Critical care was time spent personally by me on the following activities: development of treatment plan with patient and/or surrogate as well as nursing, discussions with consultants, evaluation of patient's  response to treatment, examination of patient, obtaining history from patient or surrogate, ordering and performing treatments and interventions, ordering and review of laboratory studies, ordering and review of radiographic studies, pulse oximetry and re-evaluation of patient's condition.     Gerhard Munch, MD 12/21/12 [phone]

## 2012-12-21 NOTE — CV Procedure (Signed)
Left cardiac cath/PTCA stenting report dictated on 12/21/2012 dictation number is [id number]

## 2012-12-21 NOTE — H&P (Signed)
Margaret Thompson is an 79 y.o. female.   Chief Complaint: Chest pain HPI: Patient is 79 year old female with past medical history significant for COPD, tobacco abuse, osteoporosis, history of adenocarcinoma of left breast, GERD, came to the ER by a [place]. EMS as code "STEMI was called. Patient complained of retrosternal chest pain described as pressure radiating to both arms associated with nausea which started around 4 PM approximately 2 hours prior to arrival. Denies any palpitation lightheadedness or syncope. Denies shortness of breath denies such episodes of chest pain in the past. EKG done in the ED showed normal sinus rhythm with ST elevation in leads 23 aVF with rest focal ST depression in high lateral leads and also ST depression in lead V1 V2 suggestive of acute inferoposterior wall myocardial injury. Patient denies such episodes of chest pain in the past.  Past Medical History  Diagnosis Date  . Osteoporosis   . Breast cancer   . Bilateral cataracts   . Adenocarcinoma of left breast 11/23/2012    Stage II (T1 C. N1 M0) adenocarcinoma of the left breast with surgery on 03/08/2001. She had a 1.5 cm cancer with 1/6 positive sentinel nodes. Reexcision margins were clear. She did participate in NSABP B.-30 randomized to Adriamycin and Taxotere for 4 cycles followed by radiation therapy from 07/13/2001 to 08/27/2001. She then started Arimidex on 07/07/2001 took that until the end of June 2008. T  . GERD (gastroesophageal reflux disease)     Past Surgical History  Procedure Laterality Date  . Breast surgery    . Abdominal hysterectomy    . Mastectomy, partial    . Portacath placement    . Port-a-cath removal      Family History  Problem Relation Age of Onset  . Stroke Mother    Social History:  reports that she has been smoking Cigarettes.  She has been smoking about 0.50 packs per day. She has never used smokeless tobacco. She reports that she does not drink alcohol or use illicit  drugs.  Allergies:  Allergies  Allergen Reactions  . Azithromycin Hives    Medications Prior to Admission  Medication Sig Dispense Refill  . FLUVIRIN INJ injection       . Ibuprofen (ADVIL) 200 MG CAPS Take 2 capsules by mouth 2 (two) times daily as needed. Pain.      . ranitidine (ZANTAC) 150 MG tablet Take 150 mg by mouth 2 (two) times daily as needed. Take 1/2 tablet two times daily as needed for Acid reflux.      . triamcinolone cream (KENALOG) 0.1 %         Results for orders placed during the hospital encounter of 12/21/12 (from the past 48 hour(s))  PRO B NATRIURETIC PEPTIDE     Status: None   Collection Time    12/21/12  6:02 PM      Result Value Range   Pro B Natriuretic peptide (BNP) 61.3  0 - 450 pg/mL  CBC     Status: Abnormal   Collection Time    12/21/12  6:02 PM      Result Value Range   WBC 11.6 (*) 4.0 - 10.5 K/uL   RBC 4.54  3.87 - 5.11 MIL/uL   Hemoglobin 14.4  12.0 - 15.0 g/dL   HCT 16.1  09.6 - 04.5 %   MCV 95.6  78.0 - 100.0 fL   MCH 31.7  26.0 - 34.0 pg   MCHC 33.2  30.0 - 36.0 g/dL  RDW 12.8  11.5 - 15.5 %   Platelets 271  150 - 400 K/uL  COMPREHENSIVE METABOLIC PANEL     Status: Abnormal   Collection Time    12/21/12  6:02 PM      Result Value Range   Sodium 137  135 - 145 mEq/L   Potassium 3.2 (*) 3.5 - 5.1 mEq/L   Chloride 99  96 - 112 mEq/L   CO2 23  19 - 32 mEq/L   Glucose, Bld 170 (*) 70 - 99 mg/dL   BUN 7  6 - 23 mg/dL   Creatinine, Ser 9.81  0.50 - 1.10 mg/dL   Calcium 9.4  8.4 - 19.1 mg/dL   Total Protein 7.4  6.0 - 8.3 g/dL   Albumin 3.8  3.5 - 5.2 g/dL   AST 16  0 - 37 U/L   ALT 8  0 - 35 U/L   Alkaline Phosphatase 79  39 - 117 U/L   Total Bilirubin 0.2 (*) 0.3 - 1.2 mg/dL   GFR calc non Af Amer 60 (*) >90 mL/min   GFR calc Af Amer 70 (*) >90 mL/min   Comment: (NOTE)     The eGFR has been calculated using the CKD EPI equation.     This calculation has not been validated in all clinical situations.     eGFR's persistently  <90 mL/min signify possible Chronic Kidney     Disease.  PROTIME-INR     Status: None   Collection Time    12/21/12  6:02 PM      Result Value Range   Prothrombin Time 12.6  11.6 - 15.2 seconds   INR 0.96  0.00 - 1.49  POCT I-STAT TROPONIN I     Status: None   Collection Time    12/21/12  6:19 PM      Result Value Range   Troponin i, poc 0.00  0.00 - 0.08 ng/mL   Comment 3            Comment: Due to the release kinetics of cTnI,     a negative result within the first hours     of the onset of symptoms does not rule out     myocardial infarction with certainty.     If myocardial infarction is still suspected,     repeat the test at appropriate intervals.   Dg Chest Portable 1 View  12/21/2012   CLINICAL DATA:  Chest pain  EXAM: PORTABLE CHEST - 1 VIEW  COMPARISON:  08/29/2012  FINDINGS: Cardiomediastinal silhouette is unremarkable. Mild interstitial prominence bilaterally without convincing pulmonary edema. No segmental infiltrate.  IMPRESSION: Mild interstitial prominence bilaterally without convincing pulmonary edema. No focal infiltrate.   Electronically Signed   By: Natasha Mead M.D.   On: 12/21/2012 18:17    Review of Systems  Constitutional: Negative for chills and weight loss.  Eyes: Negative for double vision and photophobia.  Respiratory: Negative for cough, hemoptysis, sputum production and shortness of breath.   Cardiovascular: Positive for chest pain. Negative for palpitations and orthopnea.  Gastrointestinal: Negative for nausea, vomiting and abdominal pain.  Genitourinary: Negative for dysuria.  Neurological: Negative for dizziness and headaches.    Blood pressure 147/70, pulse 84, resp. rate 15, SpO2 97.00%. Physical Exam  Constitutional: She is oriented to person, place, and time.  HENT:  Head: Atraumatic.  Eyes: Conjunctivae are normal. Left eye exhibits no discharge. No scleral icterus.  Neck: Neck supple. No JVD present. No tracheal  deviation present.   Cardiovascular: Normal rate and regular rhythm.   Murmur (soft systolic murmur and S4 gallop noted) heard. Respiratory: Effort normal and breath sounds normal. No respiratory distress. She has no wheezes. She has no rales.  GI: Soft. Bowel sounds are normal. She exhibits no distension. There is no tenderness. There is no rebound.  Musculoskeletal: She exhibits no tenderness.  Neurological: She is alert and oriented to person, place, and time.     Assessment/Plan Acute inferoposterior wall Karen infarction COPD Tobacco abuse History of CVA of breast GERD Osteoporosis Plan Discussed with patient briefly regarding emergency left cath possible PTCA stenting its risk and benefits and consents for PCI. Robynn Pane 12/21/2012, 7:29 PM

## 2012-12-21 NOTE — Progress Notes (Signed)
CRITICAL VALUE ALERT  Critical value received:  Troponin >20  Date of notification:  12/9   Time of notification:  2215  Critical value read back: yes  Nurse who received alert:  Rivka Spring RN   MD notified:  Expected lab value.

## 2012-12-22 LAB — CBC
HCT: 36 % (ref 36.0–46.0)
Hemoglobin: 12 g/dL (ref 12.0–15.0)
MCH: 31.6 pg (ref 26.0–34.0)
MCV: 94.7 fL (ref 78.0–100.0)
RBC: 3.8 MIL/uL — ABNORMAL LOW (ref 3.87–5.11)
WBC: 10.1 10*3/uL (ref 4.0–10.5)

## 2012-12-22 LAB — POCT I-STAT, CHEM 8
BUN: 5 mg/dL — ABNORMAL LOW (ref 6–23)
Calcium, Ion: 1.22 mmol/L (ref 1.13–1.30)
Creatinine, Ser: 1 mg/dL (ref 0.50–1.10)
Glucose, Bld: 196 mg/dL — ABNORMAL HIGH (ref 70–99)
TCO2: 20 mmol/L (ref 0–100)

## 2012-12-22 LAB — BASIC METABOLIC PANEL
BUN: 5 mg/dL — ABNORMAL LOW (ref 6–23)
CO2: 22 mEq/L (ref 19–32)
Calcium: 8.2 mg/dL — ABNORMAL LOW (ref 8.4–10.5)
Chloride: 105 mEq/L (ref 96–112)
Creatinine, Ser: 0.7 mg/dL (ref 0.50–1.10)
Glucose, Bld: 106 mg/dL — ABNORMAL HIGH (ref 70–99)

## 2012-12-22 LAB — TROPONIN I: Troponin I: 18.46 ng/mL (ref <0.30)

## 2012-12-22 MED ORDER — ATROPINE SULFATE 0.1 MG/ML IJ SOLN
INTRAMUSCULAR | Status: AC
  Filled 2012-12-22: qty 10

## 2012-12-22 MED ORDER — METOPROLOL TARTRATE 25 MG PO TABS
25.0000 mg | ORAL_TABLET | Freq: Two times a day (BID) | ORAL | Status: DC
  Administered 2012-12-22 – 2012-12-24 (×4): 25 mg via ORAL
  Filled 2012-12-22 (×4): qty 1

## 2012-12-22 MED FILL — Sodium Chloride IV Soln 0.9%: INTRAVENOUS | Qty: 50 | Status: AC

## 2012-12-22 NOTE — Progress Notes (Signed)
Chaplain was paged to ED for code stemi.  Chaplain met with the extended family and escorted them to the Cath Lab waiting area from ED.  Chaplain offered emotional and spiritual support.   12/21/12 1820  Clinical Encounter Type  Visited With Family;Patient not available  Visit Type Initial;Other (Comment) Valinda Party)  Referral From Nurse  Consult/Referral To Chaplain  Spiritual Encounters  Spiritual Needs Prayer;Emotional  Stress Factors  Patient Stress Factors None identified  Family Stress Factors Health changes   Sherrie Sport, [address]

## 2012-12-22 NOTE — Care Management Note (Signed)
    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   (If response is "NO", the following Medicare IM given date fields will be blank) Date Medicare IM given:   Date Additional Medicare IM given:    Discharge Disposition:  HOME/SELF CARE  Per UR Regulation:  Reviewed for med. necessity/level of care/duration of stay  If discussed at Long Length of Stay Meetings, dates discussed:    Comments:  12/10 1025a debbie Dayla Gasca rn,bsn have given pt brilinta 30day free and copay assist card.

## 2012-12-22 NOTE — Progress Notes (Signed)
Subjective:  Patient denies any chest pain or shortness of breath or palpitations. Noted to have a few beats of nonsustained VT on the monitor asymptomatic  Objective:  Vital Signs in the last 24 hours: Temp:  [97.5 F (36.4 C)-98.1 F (36.7 C)] 98.1 F (36.7 C) (12/10 1113) Pulse Rate:  [55-115] 95 (12/10 1100) Resp:  [12-22] 12 (12/10 1100) BP: (112-162)/(47-110) 126/110 mmHg (12/10 1100) SpO2:  [97 %-100 %] 98 % (12/10 1100) Arterial Line BP: (138-155)/(70-83) 151/77 mmHg (12/10 0045) Weight:  [55.3 kg (121 lb 14.6 oz)] 55.3 kg (121 lb 14.6 oz) (12/09 1900)  Intake/Output from previous day: 12/09 0701 - 12/10 0700 In: 1525.5 [I.V.:1525.5] Out: -  Intake/Output from this shift: Total I/O In: 241.5 [P.O.:240; I.V.:1.5] Out: 300 [Urine:300]  Physical Exam: Neck: no adenopathy, no carotid bruit, no JVD and supple, symmetrical, trachea midline Lungs: clear to auscultation bilaterally Heart: regular rate and rhythm, S1, S2 normal, no murmur, click, rub or gallop Abdomen: soft, non-tender; bowel sounds normal; no masses,  no organomegaly Extremities: extremities normal, atraumatic, no cyanosis or edema and Right groin dressing dry no hematoma  Lab Results:  Recent Labs  12/21/12 1802 12/22/12 0122  WBC 11.6* 10.1  HGB 14.4 12.0  PLT 271 237    Recent Labs  12/21/12 1802 12/22/12 0122  NA 137 138  K 3.2* 3.8  CL 99 105  CO2 23 22  GLUCOSE 170* 106*  BUN 7 5*  CREATININE 0.90 0.70    Recent Labs  12/22/12 0122 12/22/12 0900  TROPONINI >20.00* 18.46*   Hepatic Function Panel  Recent Labs  12/21/12 1802  PROT 7.4  ALBUMIN 3.8  AST 16  ALT 8  ALKPHOS 79  BILITOT 0.2*   No results found for this basename: CHOL,  in the last 72 hours No results found for this basename: PROTIME,  in the last 72 hours  Imaging: Imaging results have been reviewed and Dg Chest Portable 1 View  12/21/2012   CLINICAL DATA:  Chest pain  EXAM: PORTABLE CHEST - 1 VIEW   COMPARISON:  08/29/2012  FINDINGS: Cardiomediastinal silhouette is unremarkable. Mild interstitial prominence bilaterally without convincing pulmonary edema. No segmental infiltrate.  IMPRESSION: Mild interstitial prominence bilaterally without convincing pulmonary edema. No focal infiltrate.   Electronically Signed   By: Natasha Mead M.D.   On: 12/21/2012 18:17    Cardiac Studies:  Assessment/Plan:  Status post acute inferoposterior wall MI status post PCI 100% occluded RCA with excellent results  Status post nonsustained VT asymptomatic COPD  Tobacco abuse  History of CVA of breast  GERD  Osteoporosis Plan  Increase beta blockers as per orders Check labs in a.m. Phase I cardiac rehabilitation   LOS: 1 day    Khali Perella N 12/22/2012, 12:23 PM

## 2012-12-22 NOTE — Progress Notes (Signed)
CARDIAC REHAB PHASE I   PRE:  Rate/Rhythm: 102ST  BP:  Supine: 104/54  Sitting:   Standing:    SaO2: 98%RA  MODE:  Ambulation: 350 ft   POST:  Rate/Rhythm: 112 ST  BP:  Supine:   Sitting: 120/54  Standing:    SaO2: 100%RA 1340-1428 Pt walked 350 ft on RA with hand held asst. No CP. To recliner after walk. Call bell in reach. Began MI ed. Gave booklet and reviewed stent booklet and brilinta. Stressed importance of taking med. Discussed smoking cessation and gave handouts. Encouraged pt to call [phone] as needed. Pt does not know if she will stop or not. Quit once before cold [place] but started smoking more heavily once husband died. Will follow up tomorrow for more ed and increase distance.   Luetta Nutting, RN BSN  12/22/2012 2:23 PM

## 2012-12-22 NOTE — Cardiovascular Report (Signed)
NAMEHANNI, Margaret Thompson                  ACCOUNT NO.:  [id number]  MEDICAL RECORD NO.:  [id number]  LOCATION:  2H02C                        FACILITY:  [facility]  PHYSICIAN:  Shebra Muldrow N. Sharyn Lull, M.D. DATE OF BIRTH:  May 02, 1933  DATE OF PROCEDURE:  12/21/2012 DATE OF DISCHARGE:                           CARDIAC CATHETERIZATION   PROCEDURES: 1. Emergency left cardiac cath with selective left and right coronary     angiography, left ventriculography via right groin using Judkins     technique. 2. Successful percutaneous transluminal coronary angioplasty to 100%     occluded mid right coronary artery using 2.5 x 12 mm long Sprinter     Legend balloon. 3. Successful deployment of 3.0 x 38 mm long Xience Alpine drug-     eluting stent in mid right coronary artery. 4. Successful postdilatation of the stent using 3.25 x 20 mm long [place]     Trek balloon.  INDICATION FOR THE PROCEDURE:  [name] is a 79 year old female with past medical history significant for COPD, tobacco abuse, osteoporosis, history of adenocarcinoma of left breast, GERD.  She came to the ER by [facility] as Code STEMI was called.  The patient complained of retrosternal chest pain, described as pressure, radiating to both arms, associated with nausea, which started approximately 4 p.m., approximately 2 hours prior to arrival.  The patient denies palpitation, lightheadedness, or syncope.  Denies shortness of breath.  Denies such episodes of chest pain in the past. EKG done in the ED showed normal sinus rhythm with ST elevation in lead II, III, aVF, and reciprocal ST depression in high lateral leads and also ST depression in lead V1, V2, suggestive of acute inferoposterior wall injury.  Due to typical anginal chest pain, EKG changes discussed briefly with the patient regarding emergency left cath, possible PTCA stenting, its risks and benefits, i.e., death, MI, stroke, need for emergency CABG, local vascular  complications, etc., and consented for PCI.  DESCRIPTION OF PROCEDURE:  After obtaining the informed consent, the patient was brought to the cath lab and was placed on fluoroscopy table. Right groin was prepped and draped in usual fashion.  A 1% Xylocaine was used for local anesthesia within the right groin.  With the help of thin wall needle, a 6-French arterial sheath was placed.  Sheath was aspirated and flushed.  Next, the patient had to be moved to another cath lab as there were issues with the fluoroscopy in the lab 6.  Next, the patient was transferred to lab 5 which took about 10 to 15 minutes delay in the procedure.  Next, 6-French left Judkins catheter was advanced over the wire under fluoroscopic guidance up to the ascending aorta.  Wire was pulled out.  The catheter was aspirated and flushed, and connected to the Manifold.  Catheter was further advanced and engaged into left coronary ostium.  Multiple views of the left system were taken.  Next, catheter was disengaged and was pulled out over the wire and was replaced with 6-French right Judkins guiding catheter, which was advanced over the wire under fluoroscopic guidance up to the ascending aorta.  Wire was pulled out.  The catheter was  aspirated and connected to the Manifold.  Catheter was further advanced and engaged into right coronary ostium.  Multiple views of the right system were taken.  Next, catheter was disengaged and was pulled out over the wire at the end of the procedure and was replaced with 6-French pigtail catheter which was advanced over the wire under fluoroscopic guidance up to the ascending aorta.  Wire was pulled out.  The catheter was aspirated and connected to the Manifold.  Catheter was further advanced across the aortic valve into the LV.  LV pressures were recorded.  Next, LV graft was done in 30-degree RAO position.  Post-angiographic pressures were recorded from LV and then pullback pressures  were recorded from the aorta.  There was no gradient across the aortic valve. Next, pigtail catheter was pulled out over the wire.  Sheaths were aspirated and flushed.  FINDINGS:  LV showed good LV systolic function.  Left main was patent. LAD has 20% to 25% mid stenosis.  Diagonal 1 and 2 were patent.  Left circumflex has 50% to 60% ostial stenosis.  High OM-1 was large which was patent.  RCA has 40% to 50% proximal stenosis and then 100% occluded beyond the midportion.  With TIMI 0 flow.  INTERVENTIONAL PROCEDURE:  Successful PTCA to mid RCA was done using 2.5 x 12 mm long Sprinter Legend balloon.  After balloon inflation, the patient became bradycardic and hypotensive, requiring fluid challenge and 1 mg of atropine, and was started on Levophed which was weaned off at the end of the procedure.  Next, 3.0 x 38 mm long Xience Alpine drug- eluting stent was deployed in mid RCA at 11 atmospheric pressure.  The stent was post dilated using 3.25 x 20 mm long [place] Trek balloon going up to 18 atmospheric pressure.  Lesion dilated from 100% to 0% residual with excellent TIMI grade 3 distal flow without evidence of dissection or distal embolization.  The patient received weight-based Angiomax and 80 mg of Brilinta during the procedure.  The patient tolerated the procedure well.  There were no complications.  The patient was transferred to CCU in stable condition.     Eduardo Osier. Sharyn Lull, M.D.     MNH/MEDQ  D:  12/21/2012  T:  12/22/2012  Job:  [id number]

## 2012-12-23 LAB — BASIC METABOLIC PANEL
BUN: 6 mg/dL (ref 6–23)
Calcium: 9 mg/dL (ref 8.4–10.5)
Chloride: 107 mEq/L (ref 96–112)
Creatinine, Ser: 0.86 mg/dL (ref 0.50–1.10)
GFR calc Af Amer: 74 mL/min — ABNORMAL LOW (ref >90)
GFR calc non Af Amer: 64 mL/min — ABNORMAL LOW (ref >90)

## 2012-12-23 LAB — CBC
HCT: 36.3 % (ref 36.0–46.0)
MCHC: 32.5 g/dL (ref 30.0–36.0)
MCV: 95.8 fL (ref 78.0–100.0)
Platelets: 229 10*3/uL (ref 150–400)
RDW: 13 % (ref 11.5–15.5)

## 2012-12-23 LAB — TROPONIN I: Troponin I: 10.83 ng/mL (ref <0.30)

## 2012-12-23 NOTE — Progress Notes (Addendum)
CARDIAC REHAB PHASE I   PRE:  Rate/Rhythm: 91 SR  BP:  Supine:   Sitting: 114/60  Standing:    SaO2:   MODE:  Ambulation: 700 ft   POST:  Rate/Rhythm: 104 ST  BP:  Supine:   Sitting: 129/65  Standing:    SaO2:  1030-1110 Pt tolerated ambulation well without c/o of cp or SOB. VS stable. Pt to recliner after walk. Discussed more MI and stent education with pt. She voices understanding. Pt agrees to Outpt. CRP in [place]. She voices that she wants to quit smoking, but "I can't promise anything." We will follow pt in am to complete education. Pt moving to new room on 2 west.   Melina Copa RN 12/23/2012 11:07 AM

## 2012-12-23 NOTE — Progress Notes (Signed)
Subjective:  Patient denies any chest pain or shortness of breath. Denies any palpitations  Objective:  Vital Signs in the last 24 hours: Temp:  [98.1 F (36.7 C)-98.8 F (37.1 C)] 98.5 F (36.9 C) (12/11 0800) Pulse Rate:  [75-102] 102 (12/10 2100) Resp:  [12-25] 20 (12/11 0700) BP: (95-126)/(42-110) 105/56 mmHg (12/11 0700) SpO2:  [95 %-98 %] 98 % (12/11 0700)  Intake/Output from previous day: 12/10 0701 - 12/11 0700 In: 561.5 [P.O.:560; I.V.:1.5] Out: 700 [Urine:700] Intake/Output from this shift: Total I/O In: 480 [P.O.:480] Out: 150 [Urine:150]  Physical Exam: Neck: no adenopathy, no carotid bruit, no JVD and supple, symmetrical, trachea midline Lungs: clear to auscultation bilaterally Heart: regular rate and rhythm, S1, S2 normal, no murmur, click, rub or gallop Abdomen: soft, non-tender; bowel sounds normal; no masses,  no organomegaly Extremities: extremities normal, atraumatic, no cyanosis or edema and  right groin stable  Lab Results:  Recent Labs  12/22/12 0122 12/23/12 0421  WBC 10.1 8.8  HGB 12.0 11.8*  PLT 237 229    Recent Labs  12/22/12 0122 12/23/12 0421  NA 138 141  K 3.8 3.6  CL 105 107  CO2 22 27  GLUCOSE 106* 88  BUN 5* 6  CREATININE 0.70 0.86    Recent Labs  12/22/12 0900 12/23/12 0421  TROPONINI 18.46* 10.83*   Hepatic Function Panel  Recent Labs  12/21/12 1802  PROT 7.4  ALBUMIN 3.8  AST 16  ALT 8  ALKPHOS 79  BILITOT 0.2*   No results found for this basename: CHOL,  in the last 72 hours No results found for this basename: PROTIME,  in the last 72 hours  Imaging: Imaging results have been reviewed and Dg Chest Portable 1 View  12/21/2012   CLINICAL DATA:  Chest pain  EXAM: PORTABLE CHEST - 1 VIEW  COMPARISON:  08/29/2012  FINDINGS: Cardiomediastinal silhouette is unremarkable. Mild interstitial prominence bilaterally without convincing pulmonary edema. No segmental infiltrate.  IMPRESSION: Mild interstitial  prominence bilaterally without convincing pulmonary edema. No focal infiltrate.   Electronically Signed   By: Natasha Mead M.D.   On: 12/21/2012 18:17    Cardiac Studies:  Assessment/Plan:  Status post acute inferoposterior wall MI status post PCI 100% occluded RCA with excellent results  Status post nonsustained VT asymptomatic  COPD  Tobacco abuse  History of CVA of breast  GERD  Osteoporosis  Plan  Continue present management Transfer to telemetry Smoking sensation consult Possible discharge tomorrow if stable Check labs in a.m.  LOS: 2 days    Liyla Radliff N 12/23/2012, 9:37 AM

## 2012-12-24 LAB — CBC
HCT: 37.9 % (ref 36.0–46.0)
Hemoglobin: 12.5 g/dL (ref 12.0–15.0)
MCH: 31.3 pg (ref 26.0–34.0)
MCHC: 33 g/dL (ref 30.0–36.0)
MCV: 95 fL (ref 78.0–100.0)

## 2012-12-24 LAB — TROPONIN I: Troponin I: 4.73 ng/mL (ref <0.30)

## 2012-12-24 LAB — COMPREHENSIVE METABOLIC PANEL
BUN: 7 mg/dL (ref 6–23)
CO2: 26 mEq/L (ref 19–32)
Calcium: 9.1 mg/dL (ref 8.4–10.5)
Creatinine, Ser: 0.85 mg/dL (ref 0.50–1.10)
GFR calc Af Amer: 75 mL/min — ABNORMAL LOW (ref >90)
GFR calc non Af Amer: 64 mL/min — ABNORMAL LOW (ref >90)
Glucose, Bld: 82 mg/dL (ref 70–99)
Sodium: 140 mEq/L (ref 135–145)
Total Bilirubin: 0.7 mg/dL (ref 0.3–1.2)
Total Protein: 6.5 g/dL (ref 6.0–8.3)

## 2012-12-24 LAB — BASIC METABOLIC PANEL
BUN: 7 mg/dL (ref 6–23)
Calcium: 8.8 mg/dL (ref 8.4–10.5)
Chloride: 106 mEq/L (ref 96–112)
Creatinine, Ser: 0.79 mg/dL (ref 0.50–1.10)
GFR calc Af Amer: >90 mL/min (ref >90)
Glucose, Bld: 86 mg/dL (ref 70–99)
Potassium: 3.4 mEq/L — ABNORMAL LOW (ref 3.5–5.1)

## 2012-12-24 MED ORDER — NITROGLYCERIN 0.4 MG SL SUBL: 0.4000 mg | SUBLINGUAL_TABLET | SUBLINGUAL | Status: DC | PRN

## 2012-12-24 MED ORDER — ATORVASTATIN CALCIUM 80 MG PO TABS: 80.0000 mg | ORAL_TABLET | Freq: Every day | ORAL | Status: DC

## 2012-12-24 MED ORDER — ASPIRIN 81 MG PO TBEC: 81.0000 mg | DELAYED_RELEASE_TABLET | Freq: Every day | ORAL | Status: DC

## 2012-12-24 MED ORDER — POTASSIUM CHLORIDE CRYS ER 20 MEQ PO TBCR: 40.0000 meq | EXTENDED_RELEASE_TABLET | Freq: Once | ORAL | Status: DC

## 2012-12-24 MED ORDER — METOPROLOL TARTRATE 25 MG PO TABS
25.0000 mg | ORAL_TABLET | ORAL | Status: AC
  Administered 2012-12-24: 25 mg via ORAL
  Filled 2012-12-24: qty 1

## 2012-12-24 MED ORDER — METOPROLOL SUCCINATE ER 50 MG PO TB24: 50.0000 mg | ORAL_TABLET | Freq: Every day | ORAL | Status: DC

## 2012-12-24 MED ORDER — TICAGRELOR 90 MG PO TABS: 90.0000 mg | ORAL_TABLET | Freq: Two times a day (BID) | ORAL | Status: DC

## 2012-12-24 NOTE — Discharge Summary (Signed)
  Discharge summary dictated on 12/24/2012 dictation number is [phone]

## 2012-12-24 NOTE — Progress Notes (Signed)
12/24/2012 6:37 PM Nursing note Discharge avs form, medications already taken today and those due this evening given and explained to patient and daughter. Incision site care, follow up appointments and when to call MD reviewed. Correct use of SL NTG also reviewed. Pt. Verbalized understanding of instructions. Pt expressed concerns about picking up RX tonight due to her bank being closed to MD. Pt. Daughter states she would assist with medication pickup for tonight so that pt. Could continue regular medication schedule without interruption. D/c iv line. D/c tele. D/c home per orders.  Linlee Cromie, Blanchard Kelch

## 2012-12-24 NOTE — Progress Notes (Signed)
Cardiac Rehab [phone] Completed MI education with pt and sisters. They voice understanding. Beatrix Fetters, RN 12/24/2012 9:47 AM

## 2012-12-25 NOTE — Discharge Summary (Signed)
NAMECARINE, NORDGREN                  ACCOUNT NO.:  [id number]  MEDICAL RECORD NO.:  [id number]  LOCATION:  2W19C                        FACILITY:  [facility]  PHYSICIAN:  Eduardo Osier. Sharyn Lull, M.D. DATE OF BIRTH:  January 10, 1934  DATE OF ADMISSION:  12/21/2012 DATE OF DISCHARGE:  12/24/2012                              DISCHARGE SUMMARY   ADMITTING DIAGNOSES: 1. Acute inferoposterior wall myocardial infarction. 2. Chronic obstructive pulmonary disease. 3. Tobacco abuse. 4. History of cancer of the breast. 5. Gastroesophageal reflux disease. 6. Osteoporosis.  DISCHARGE DIAGNOSES: 1. Status post acute inferoposterior wall myocardial infarction,     status post emergency percutaneous transluminal coronary     angioplasty and stenting to 100% occluded right coronary artery. 2. Hypertension. 3. Status post nonsustained ventricular tachycardia. 4. Chronic obstructive pulmonary disease. 5. Tobacco abuse. 6. History of cancer of breast. 7. Gastroesophageal reflux disease. 8. Hypercholesteremia. 9. Osteoporosis.  DISCHARGED HOME MEDICATIONS: 1. Aspirin 81 mg 1 tablet daily. 2. Atorvastatin 80 mg 1 tablet daily. 3. Metoprolol succinate 50 mg 1 tablet daily. 4. Nitrostat 0.4 mg sublingual use as directed. 5. Brilinta 90 mg 1 tablet twice daily. 6. Ranitidine 150 mg twice daily as needed as before. 7. The patient has been advised to stop Advil.  DIET:  Low salt, low cholesterol.  Post-PTCA and stent instructions have been given.  Follow up with me in 1 week.  CONDITION AT DISCHARGE:  Stable.  The patient has been advised to refrain from smoking.  The patient will be scheduled for phase II cardiac rehab as outpatient.  BRIEF HISTORY AND HOSPITAL COURSE:  Margaret Thompson is a 79 year old female with past medical history significant for COPD, tobacco abuse, osteoporosis, history of adenocarcinoma of the left breast, GERD.  She came to the ER by [facility] as code STEMI was called.   The patient complained of retrosternal chest pain described as pressure, radiating to both arms, associated with nausea, which started around 4 p.m. approximately 2 hours prior to arrival.  Denies any palpitation, lightheadedness or syncope.  Denies shortness of breath.  Denies such episodes of chest pain in the past.  EKG done in the ED showed normal sinus rhythm with ST elevation in leads 2, 3, aVF, and reciprocal ST depression in high lateral leads.  Also ST depression in V1, V2 suggestive of acute inferoposterior wall myocardial injury.  The patient denies such episodes of chest pain in the past.  PAST MEDICAL HISTORY:  As above.  PAST SURGICAL HISTORY:  She had left breast surgery in the past, abdominal hysterectomy in the past, and partial mastectomy in the past, had Port-A-Cath insertion in the past, subsequently was removed in the past.  PHYSICAL EXAMINATION:  GENERAL:  She was alert, awake, oriented x3. VITAL SIGNS:  Blood pressure was 147/70, pulse was 84.  She was afebrile. HEENT:  Conjunctivae was pink. NECK:  Supple.  No JVD.  No bruit. LUNGS:  Clear to auscultation without rhonchi or rales. CARDIOVASCULAR:  S1, S2 was normal.  There was soft systolic murmur and S4 gallop noted. ABDOMEN:  Soft.  Bowel sounds were present.  Nontender. EXTREMITIES:  There was no clubbing, cyanosis, or edema.  LABORATORY DATA:  Her sodium was 137, potassium 3.2, BUN was 7, creatinine 0.90.  ProBNP was 61.3.  Hemoglobin was 14.4, hematocrit 43.4.  Her first set of troponin-I point of care was 0.  Repeat troponin I post PCI was about 20 x 2 and then 18.46, repeat troponin 10.83.  Last troponin was 4.73 which is trending down.  Last labs, sodium was 140, potassium 4.2, BUN 7, creatinine 0.85.  Her hemoglobin was 12.5, hematocrit 37.9, white count of 8.4 which has been stable.  Her TSH was 0.927.  Lipid panel was not available.  BRIEF HOSPITAL COURSE:  The patient was transferred from ER  and was taken directly to the cath lab and underwent emergency left cath and PTCA and stenting to 100% occluded RCA with excellent results.  The patient tolerated the procedure well.  There were no complications. Postprocedure, the patient did not had any episodes of chest pain, but had few beats of nonsustained VT asymptomatic.  Her beta blocker dose was increased.  The patient did not had any episodes of chest pain during the hospital stay.  Her groin is stable with no evidence of hematoma or bruit.  Phase 1 cardiac rehab was called.  The patient has been ambulating in room and hallway without any problems.  The patient will be discharged home on above medications and will be followed up in my office in 1 week.     Eduardo Osier. Sharyn Lull, M.D.     MNH/MEDQ  D:  12/24/2012  T:  12/25/2012  Job:  [id number]

## 2012-12-27 ENCOUNTER — Encounter (HOSPITAL_COMMUNITY): Payer: Self-pay | Admitting: Emergency Medicine

## 2012-12-27 ENCOUNTER — Emergency Department (HOSPITAL_COMMUNITY)
Admission: EM | Admit: 2012-12-27 | Discharge: 2012-12-28 | Disposition: A | Discharge status: Home / Self Care | Payer: Medicare Other | Attending: Emergency Medicine | Admitting: Emergency Medicine

## 2012-12-27 DIAGNOSIS — Z79899 Other long term (current) drug therapy: Secondary | ICD-10-CM | POA: Insufficient documentation

## 2012-12-27 DIAGNOSIS — Z7901 Long term (current) use of anticoagulants: Secondary | ICD-10-CM | POA: Insufficient documentation

## 2012-12-27 DIAGNOSIS — Z7982 Long term (current) use of aspirin: Secondary | ICD-10-CM | POA: Insufficient documentation

## 2012-12-27 DIAGNOSIS — K219 Gastro-esophageal reflux disease without esophagitis: Secondary | ICD-10-CM | POA: Insufficient documentation

## 2012-12-27 DIAGNOSIS — R58 Hemorrhage, not elsewhere classified: Secondary | ICD-10-CM | POA: Insufficient documentation

## 2012-12-27 DIAGNOSIS — L500 Allergic urticaria: Secondary | ICD-10-CM | POA: Insufficient documentation

## 2012-12-27 DIAGNOSIS — T462X5A Adverse effect of other antidysrhythmic drugs, initial encounter: Secondary | ICD-10-CM | POA: Insufficient documentation

## 2012-12-27 DIAGNOSIS — Z8669 Personal history of other diseases of the nervous system and sense organs: Secondary | ICD-10-CM | POA: Insufficient documentation

## 2012-12-27 DIAGNOSIS — T466X5A Adverse effect of antihyperlipidemic and antiarteriosclerotic drugs, initial encounter: Secondary | ICD-10-CM | POA: Insufficient documentation

## 2012-12-27 DIAGNOSIS — Z853 Personal history of malignant neoplasm of breast: Secondary | ICD-10-CM | POA: Insufficient documentation

## 2012-12-27 DIAGNOSIS — F17200 Nicotine dependence, unspecified, uncomplicated: Secondary | ICD-10-CM | POA: Insufficient documentation

## 2012-12-27 DIAGNOSIS — I252 Old myocardial infarction: Secondary | ICD-10-CM | POA: Insufficient documentation

## 2012-12-27 DIAGNOSIS — T7840XA Allergy, unspecified, initial encounter: Secondary | ICD-10-CM

## 2012-12-27 DIAGNOSIS — L509 Urticaria, unspecified: Secondary | ICD-10-CM

## 2012-12-27 DIAGNOSIS — Z8739 Personal history of other diseases of the musculoskeletal system and connective tissue: Secondary | ICD-10-CM | POA: Insufficient documentation

## 2012-12-27 HISTORY — DX: Acute myocardial infarction, unspecified: I21.9

## 2012-12-27 NOTE — ED Notes (Signed)
Pt reporting starting a new medication on Saturday.  States that today she noticed generalized red blotchy rash. Pt reports rash does itch.  Denies any SOB at this time.

## 2012-12-27 NOTE — ED Notes (Signed)
Rash and itching to abdomen, noted tonight, admits to 3 new meds since her recent MI

## 2012-12-28 MED ORDER — PREDNISONE 10 MG PO TABS: 20.0000 mg | ORAL_TABLET | Freq: Every day | ORAL | Status: DC

## 2012-12-28 MED ORDER — DIPHENHYDRAMINE HCL 25 MG PO TABS: 25.0000 mg | ORAL_TABLET | Freq: Four times a day (QID) | ORAL | Status: DC

## 2012-12-28 MED ORDER — DIPHENHYDRAMINE HCL 50 MG/ML IJ SOLN
25.0000 mg | Freq: Once | INTRAMUSCULAR | Status: AC
  Administered 2012-12-28: 25 mg via INTRAVENOUS
  Filled 2012-12-28: qty 1

## 2012-12-28 MED ORDER — METHYLPREDNISOLONE SODIUM SUCC 125 MG IJ SOLR
125.0000 mg | Freq: Once | INTRAMUSCULAR | Status: AC
  Administered 2012-12-28: 125 mg via INTRAVENOUS
  Filled 2012-12-28: qty 2

## 2012-12-28 MED ORDER — SODIUM CHLORIDE 0.9 % IV BOLUS (SEPSIS)
250.0000 mL | Freq: Once | INTRAVENOUS | Status: AC
  Administered 2012-12-28: 250 mL via INTRAVENOUS

## 2012-12-28 MED ORDER — FAMOTIDINE 20 MG PO TABS: 20.0000 mg | ORAL_TABLET | Freq: Two times a day (BID) | ORAL | Status: DC

## 2012-12-28 MED ORDER — SODIUM CHLORIDE 0.9 % IV SOLN
INTRAVENOUS | Status: DC
  Administered 2012-12-28: 02:00:00 via INTRAVENOUS

## 2012-12-28 MED ORDER — FAMOTIDINE IN NACL 20-0.9 MG/50ML-% IV SOLN
20.0000 mg | Freq: Once | INTRAVENOUS | Status: AC
  Administered 2012-12-28: 20 mg via INTRAVENOUS
  Filled 2012-12-28: qty 50

## 2012-12-28 NOTE — ED Provider Notes (Signed)
CSN: [id number]     Arrival date & time 12/27/12  2118 History   First MD Initiated Contact with Patient 12/28/12 0017     Chief Complaint  Patient presents with  . Rash   (Consider location/radiation/quality/duration/timing/severity/associated sxs/prior Treatment) Patient is a 79 y.o. female presenting with rash. The history is provided by the patient.  Rash Associated symptoms: no abdominal pain, no fever, no headaches and no shortness of breath    patient recently discharged following acute heart attack. Patient's cardiologist started her on 3 new medications one for blood thinning 1 a statin and 1 a beta blocker. Patient started these medications on Saturday or Sunday. This evening patient broke out with a rash around 5 PM very itchy rash was all over. No throat swelling no throat tightness no lip swelling no tongue swelling no trouble breathing. No history of similar reaction. No other exposures. Patient did not take any medications at home.  Past Medical History  Diagnosis Date  . Osteoporosis   . Bilateral cataracts   . GERD (gastroesophageal reflux disease)   . Acute MI   . Breast cancer   . Adenocarcinoma of left breast 11/23/2012    Stage II (T1 C. N1 M0) adenocarcinoma of the left breast with surgery on 03/08/2001. She had a 1.5 cm cancer with 1/6 positive sentinel nodes. Reexcision margins were clear. She did participate in NSABP B.-30 randomized to Adriamycin and Taxotere for 4 cycles followed by radiation therapy from 07/13/2001 to 08/27/2001. She then started Arimidex on 07/07/2001 took that until the end of June 2008. T   Past Surgical History  Procedure Laterality Date  . Breast surgery    . Abdominal hysterectomy    . Mastectomy, partial    . Portacath placement    . Port-a-cath removal    . Percutaneous stent intervention     Family History  Problem Relation Age of Onset  . Stroke Mother    History  Substance Use Topics  . Smoking status: Current Every Day  Smoker -- 0.50 packs/day    Types: Cigarettes  . Smokeless tobacco: Never Used  . Alcohol Use: No   OB History   Grav Para Term Preterm Abortions TAB SAB Ect Mult Living                 Review of Systems  Constitutional: Negative for fever.  HENT: Negative for congestion and trouble swallowing.   Eyes: Negative for redness.  Respiratory: Negative for shortness of breath.   Cardiovascular: Negative for chest pain.  Gastrointestinal: Negative for abdominal pain.  Genitourinary: Negative for dysuria and hematuria.  Musculoskeletal: Negative for back pain, neck pain and neck stiffness.  Skin: Positive for rash.  Neurological: Negative for facial asymmetry and headaches.  Hematological: Bruises/bleeds easily.  Psychiatric/Behavioral: Negative for confusion.    Allergies  Azithromycin  Home Medications   Current Outpatient Rx  Name  Route  Sig  Dispense  Refill  . aspirin EC 81 MG EC tablet   Oral   Take 1 tablet (81 mg total) by mouth daily.   30 tablet   3   . atorvastatin (LIPITOR) 80 MG tablet   Oral   Take 1 tablet (80 mg total) by mouth daily at 6 PM.   30 tablet   3   . diphenhydrAMINE (BENADRYL) 25 MG tablet   Oral   Take 1 tablet (25 mg total) by mouth every 6 (six) hours.   20 tablet   0   .  famotidine (PEPCID) 20 MG tablet   Oral   Take 1 tablet (20 mg total) by mouth 2 (two) times daily.   14 tablet   0   . metoprolol succinate (TOPROL XL) 50 MG 24 hr tablet   Oral   Take 1 tablet (50 mg total) by mouth daily. Take with or immediately following a meal.   30 tablet   3   . nitroGLYCERIN (NITROSTAT) 0.4 MG SL tablet   Sublingual   Place 1 tablet (0.4 mg total) under the tongue every 5 (five) minutes x 3 doses as needed for chest pain.   25 tablet   12   . predniSONE (DELTASONE) 10 MG tablet   Oral   Take 2 tablets (20 mg total) by mouth daily.   10 tablet   0   . ranitidine (ZANTAC) 150 MG tablet   Oral   Take 150 mg by mouth 2 (two)  times daily as needed. Take 1/2 tablet two times daily as needed for Acid reflux.         . Ticagrelor (BRILINTA) 90 MG TABS tablet   Oral   Take 1 tablet (90 mg total) by mouth 2 (two) times daily.   60 tablet   11    BP 125/68  Pulse 96  Temp(Src) 97.9 F (36.6 C) (Oral)  Resp 18  Ht 5\' 2"  (1.575 m)  Wt 128 lb (58.06 kg)  BMI 23.41 kg/m2  SpO2 98% Physical Exam  Nursing note and vitals reviewed. Constitutional: She is oriented to person, place, and time. She appears well-developed and well-nourished. No distress.  HENT:  Head: Normocephalic and atraumatic.  Mouth/Throat: Oropharynx is clear and moist.  No tongue or lip swelling.  Eyes: Conjunctivae are normal. Pupils are equal, round, and reactive to light.  Neck: Normal range of motion. Neck supple.  Cardiovascular: Normal rate, regular rhythm, normal heart sounds and intact distal pulses.   No murmur heard. Pulmonary/Chest: Effort normal and breath sounds normal. No respiratory distress. She has no wheezes.  Abdominal: Soft. Bowel sounds are normal. There is no tenderness.  Musculoskeletal: Normal range of motion.  Neurological: She is alert and oriented to person, place, and time. No cranial nerve deficit. Coordination normal.  Skin: Skin is warm. Rash noted. There is erythema.  Extensive hives erythematous blanching throughout most of the body.    ED Course  Procedures (including critical care time) Labs Review Labs Reviewed - No data to display Imaging Review No results found.  EKG Interpretation   None       MDM   1. Hives   2. Allergic reaction caused by a drug    Patient with extensive hives. No lip swelling no tongue swelling no wheezing no throat closing. We treatments in the emergency department the hives improved some but have not resolved itching has improved significantly. Patient feels a lot more comfortable. Suspect is one of her 3 new medications started on Saturday by her cardiologist.  Clinically a little suspicious that could be the blood thinner or could be the statin drug. Patient will contact her cardiologist in the morning to see which one to stop or switch. In the meantime will take Benadryl Pepcid and prednisone. Patient will return for any worse symptoms.    Shelda Jakes, MD 12/28/12 [phone]

## 2012-12-28 NOTE — ED Notes (Signed)
Pt reporting improvement in rash.  Reports rash isn't itching as bad and isn't quite as red.

## 2013-02-22 ENCOUNTER — Other Ambulatory Visit (HOSPITAL_COMMUNITY): Payer: Self-pay | Admitting: Oncology

## 2013-02-22 DIAGNOSIS — Z139 Encounter for screening, unspecified: Secondary | ICD-10-CM

## 2013-02-28 ENCOUNTER — Ambulatory Visit (HOSPITAL_COMMUNITY)
Admission: RE | Admit: 2013-02-28 | Discharge: 2013-02-28 | Disposition: A | Discharge status: Home / Self Care | Payer: Medicare Other | Source: Ambulatory Visit | Attending: Oncology | Admitting: Oncology

## 2013-02-28 DIAGNOSIS — Z139 Encounter for screening, unspecified: Secondary | ICD-10-CM

## 2013-02-28 DIAGNOSIS — Z1231 Encounter for screening mammogram for malignant neoplasm of breast: Secondary | ICD-10-CM | POA: Insufficient documentation

## 2013-05-03 ENCOUNTER — Emergency Department (HOSPITAL_COMMUNITY): Payer: Medicare Other

## 2013-05-03 ENCOUNTER — Emergency Department (HOSPITAL_COMMUNITY)
Admission: EM | Admit: 2013-05-03 | Discharge: 2013-05-03 | Disposition: A | Discharge status: Home / Self Care | Payer: Medicare Other | Attending: Emergency Medicine | Admitting: Emergency Medicine

## 2013-05-03 ENCOUNTER — Encounter (HOSPITAL_COMMUNITY): Payer: Self-pay | Admitting: Emergency Medicine

## 2013-05-03 DIAGNOSIS — Z7982 Long term (current) use of aspirin: Secondary | ICD-10-CM | POA: Insufficient documentation

## 2013-05-03 DIAGNOSIS — Z7901 Long term (current) use of anticoagulants: Secondary | ICD-10-CM | POA: Insufficient documentation

## 2013-05-03 DIAGNOSIS — J209 Acute bronchitis, unspecified: Secondary | ICD-10-CM | POA: Insufficient documentation

## 2013-05-03 DIAGNOSIS — Z8739 Personal history of other diseases of the musculoskeletal system and connective tissue: Secondary | ICD-10-CM | POA: Insufficient documentation

## 2013-05-03 DIAGNOSIS — Z8669 Personal history of other diseases of the nervous system and sense organs: Secondary | ICD-10-CM | POA: Insufficient documentation

## 2013-05-03 DIAGNOSIS — Z79899 Other long term (current) drug therapy: Secondary | ICD-10-CM | POA: Insufficient documentation

## 2013-05-03 DIAGNOSIS — Z9861 Coronary angioplasty status: Secondary | ICD-10-CM | POA: Insufficient documentation

## 2013-05-03 DIAGNOSIS — I252 Old myocardial infarction: Secondary | ICD-10-CM | POA: Insufficient documentation

## 2013-05-03 DIAGNOSIS — K219 Gastro-esophageal reflux disease without esophagitis: Secondary | ICD-10-CM | POA: Insufficient documentation

## 2013-05-03 DIAGNOSIS — F17200 Nicotine dependence, unspecified, uncomplicated: Secondary | ICD-10-CM | POA: Insufficient documentation

## 2013-05-03 DIAGNOSIS — Z853 Personal history of malignant neoplasm of breast: Secondary | ICD-10-CM | POA: Insufficient documentation

## 2013-05-03 DIAGNOSIS — J440 Chronic obstructive pulmonary disease with acute lower respiratory infection: Secondary | ICD-10-CM | POA: Insufficient documentation

## 2013-05-03 MED ORDER — DM-GUAIFENESIN ER 30-600 MG PO TB12
1.0000 | ORAL_TABLET | Freq: Once | ORAL | Status: AC
Start: 2013-05-03 — End: 2013-05-03
  Administered 2013-05-03: 1 via ORAL
  Filled 2013-05-03: qty 1

## 2013-05-03 MED ORDER — CETIRIZINE HCL 10 MG PO CAPS: 10.0000 mg | ORAL_CAPSULE | Freq: Every day | ORAL | Status: DC

## 2013-05-03 MED ORDER — DOXYCYCLINE HYCLATE 100 MG PO TABS
100.0000 mg | ORAL_TABLET | Freq: Two times a day (BID) | ORAL | Status: DC
  Administered 2013-05-03: 100 mg via ORAL
  Filled 2013-05-03: qty 1

## 2013-05-03 MED ORDER — DM-GUAIFENESIN ER 30-600 MG PO TB12: 1.0000 | ORAL_TABLET | Freq: Two times a day (BID) | ORAL | Status: DC | PRN

## 2013-05-03 MED ORDER — DOXYCYCLINE HYCLATE 100 MG PO CAPS: 100.0000 mg | ORAL_CAPSULE | Freq: Two times a day (BID) | ORAL | Status: DC

## 2013-05-03 NOTE — ED Notes (Signed)
Pt reports cough x 2 days.   Reports incontinent of urine when coughs.  Reports "feels sore on the inside" when she coughs.  Denies fever.

## 2013-05-03 NOTE — ED Provider Notes (Signed)
CSN: [id number]     Arrival date & time 05/03/13  1801 History  This chart was scribed for Janice Norrie, MD by Jenne Campus, ED Scribe. This patient was seen in room APA09/APA09 and the patient's care was started at 9:27 PM.    Chief Complaint  Patient presents with  . Cough    The history is provided by the patient. No language interpreter was used.    HPI Comments: Margaret Thompson is a 80 y.o. female who presents to the Emergency Department complaining of cough productive of "dull, thick" sputum that started 2 days ago with associated symptoms of white rhinorrhea, sneezing, and mild left lateral chest pain when coughing. She states that the cough is worse in the morning and improves throughout the day. She denies any prior h/o of respiratory conditions or allergy problems. She denies any fever, nausea, emesis, diarhea, wheezing, sore throat or SOB as associated symptoms. She has a h/o breast CA treated with mastectomy and a MI with cardiac stent placement in December 2014. She states that she is 0.5 ppd smoker  PCP is Dr Gerarda Fraction.  Past Medical History  Diagnosis Date  . Osteoporosis   . Bilateral cataracts   . GERD (gastroesophageal reflux disease)   . Acute MI   . Breast cancer   . Adenocarcinoma of left breast 11/23/2012    Stage II (T1 C. N1 M0) adenocarcinoma of the left breast with surgery on 03/08/2001. She had a 1.5 cm cancer with 1/6 positive sentinel nodes. Reexcision margins were clear. She did participate in NSABP B.-30 randomized to Adriamycin and Taxotere for 4 cycles followed by radiation therapy from 07/13/2001 to 08/27/2001. She then started Arimidex on 07/07/2001 took that until the end of June 2008. T   Past Surgical History  Procedure Laterality Date  . Breast surgery    . Abdominal hysterectomy    . Mastectomy, partial    . Portacath placement    . Port-a-cath removal    . Percutaneous stent intervention     Family History  Problem Relation Age of Onset  .  Stroke Mother    History  Substance Use Topics  . Smoking status: Current Every Day Smoker -- 0.50 packs/day    Types: Cigarettes  . Smokeless tobacco: Never Used  . Alcohol Use: No  She lives alone but her kids stay across the street.  Lives at home  No OB history provided.  Review of Systems  Constitutional: Negative for fever.  HENT: Positive for rhinorrhea and sneezing. Negative for sore throat.   Respiratory: Positive for cough. Negative for shortness of breath and wheezing.   Cardiovascular: Positive for chest pain.  Gastrointestinal: Negative for vomiting and diarrhea.  All other systems reviewed and are negative.   Allergies  Azithromycin  Home Medications   Prior to Admission medications   Medication Sig Start Date End Date Taking? Authorizing Provider  aspirin EC 81 MG EC tablet Take 1 tablet (81 mg total) by mouth daily. 12/24/12  Yes Clent Demark, MD  atorvastatin (LIPITOR) 80 MG tablet Take 80 mg by mouth every evening.   Yes Historical Provider, MD  CARTIA XT 180 MG 24 hr capsule Take 180 mg by mouth daily. 05/02/13  Yes Historical Provider, MD  ranitidine (ZANTAC) 150 MG tablet Take 75 mg by mouth 2 (two) times daily as needed for heartburn. Take 1/2 tablet two times daily as needed for Acid reflux.   Yes Historical Provider, MD  Ticagrelor (BRILINTA) 90  MG TABS tablet Take 1 tablet (90 mg total) by mouth 2 (two) times daily. 12/24/12  Yes Clent Demark, MD  nitroGLYCERIN (NITROSTAT) 0.4 MG SL tablet Place 1 tablet (0.4 mg total) under the tongue every 5 (five) minutes x 3 doses as needed for chest pain. 12/24/12   Clent Demark, MD   Triage Vitalss: BP 123/81  Pulse 109  Temp(Src) 98.9 F (37.2 C) (Oral)  Resp 18  Ht 5\' 2"  (1.575 m)  Wt 118 lb (53.524 kg)  BMI 21.58 kg/m2  SpO2 100%   Vital signs normal except tachycardia   Physical Exam  Nursing note and vitals reviewed. Constitutional: She is oriented to person, place, and time. She appears  well-developed and well-nourished.  Non-toxic appearance. She does not appear ill. No distress.  HENT:  Head: Normocephalic and atraumatic.  Right Ear: External ear normal.  Left Ear: External ear normal.  Nose: Nose normal. No mucosal edema or rhinorrhea.  Mouth/Throat: Oropharynx is clear and moist and mucous membranes are normal. No dental abscesses or uvula swelling.  Deviated nasal septum on the left, mild injection of the soft palate   Eyes: Conjunctivae and EOM are normal. Pupils are equal, round, and reactive to light.  Neck: Normal range of motion and full passive range of motion without pain. Neck supple.  Cardiovascular: Normal rate, regular rhythm and normal heart sounds.  Exam reveals no gallop and no friction rub.   No murmur heard. Pulmonary/Chest: Effort normal and breath sounds normal. No respiratory distress. She has no wheezes. She has no rhonchi. She has no rales. She exhibits no tenderness and no crepitus.  Abdominal: Soft. Normal appearance and bowel sounds are normal. She exhibits no distension. There is no tenderness. There is no rebound and no guarding.  Musculoskeletal: Normal range of motion. She exhibits no edema and no tenderness.  Moves all extremities well.   Neurological: She is alert and oriented to person, place, and time. She has normal strength. No cranial nerve deficit.  Skin: Skin is warm, dry and intact. No rash noted. No erythema. No pallor.  Psychiatric: She has a normal mood and affect. Her speech is normal and behavior is normal. Her mood appears not anxious.    ED Course  Procedures (including critical care time)  Medications  doxycycline (VIBRA-TABS) tablet 100 mg (100 mg Oral Given 05/03/13 2153)  dextromethorphan-guaiFENesin (MUCINEX DM) 30-600 MG per 12 hr tablet 1 tablet (1 tablet Oral Given 05/03/13 2153)    DIAGNOSTIC STUDIES: Oxygen Saturation is 100% on RA, Normal by my interpretation.    COORDINATION OF CARE: 9:29 PM-Reviewed CXR  which showed signs of emphysema with the pt in the room. Discussed treatment plan which includes Mucinex and Doxycycline with pt at bedside and pt agreed to plan.    Labs Review Labs Reviewed - No data to display  Imaging Review Dg Chest 2 View  05/03/2013   CLINICAL DATA:  Cough  EXAM: CHEST  2 VIEW  COMPARISON:  December 21, 2012  FINDINGS: There is underlying emphysematous change. There is no edema or consolidation. The heart size is normal. Pulmonary vascularity reflects underlying emphysema. There is atherosclerotic change in the aorta. No bone lesions. There are surgical clips in the left axillary region.  IMPRESSION: Underlying emphysematous change.  No edema or consolidation.   Electronically Signed   By: Lowella Grip M.D.   On: 05/03/2013 18:52     EKG Interpretation None      MDM patient has  history of COPD and continues to smoke. She was started on antibiotics because of her smoking history.    Final diagnoses:  COPD with acute bronchitis    Discharge Medication List as of 05/03/2013  9:33 PM    START taking these medications   Details  Cetirizine HCl (ZYRTEC ALLERGY) 10 MG CAPS Take 1 capsule (10 mg total) by mouth daily., Starting 05/03/2013, Until Discontinued, Print    dextromethorphan-guaiFENesin (MUCINEX DM) 30-600 MG per 12 hr tablet Take 1 tablet by mouth 2 (two) times daily as needed for cough., Starting 05/03/2013, Until Discontinued, Print    doxycycline (VIBRAMYCIN) 100 MG capsule Take 1 capsule (100 mg total) by mouth 2 (two) times daily., Starting 05/03/2013, Until Discontinued, Print        Plan discharge   Rolland Porter, MD, FACEP   I personally performed the services described in this documentation, which was scribed in my presence. The recorded information has been reviewed and considered.  Rolland Porter, MD, FACEP     Janice Norrie, MD 05/04/13 [phone]

## 2013-05-03 NOTE — Discharge Instructions (Signed)
Drink plenty of fluids. Take the medications as prescribed. Recheck if you get a fever, struggle to breathe or you feel worse.

## 2013-06-29 ENCOUNTER — Other Ambulatory Visit (HOSPITAL_COMMUNITY): Payer: Self-pay | Admitting: Physician Assistant

## 2013-06-29 DIAGNOSIS — Z01419 Encounter for gynecological examination (general) (routine) without abnormal findings: Secondary | ICD-10-CM

## 2013-07-05 ENCOUNTER — Ambulatory Visit (HOSPITAL_COMMUNITY)
Admission: RE | Admit: 2013-07-05 | Discharge: 2013-07-05 | Disposition: A | Discharge status: Home / Self Care | Payer: Medicare Other | Source: Ambulatory Visit | Attending: Physician Assistant | Admitting: Physician Assistant

## 2013-07-05 DIAGNOSIS — Z01419 Encounter for gynecological examination (general) (routine) without abnormal findings: Secondary | ICD-10-CM

## 2013-07-05 DIAGNOSIS — Z13820 Encounter for screening for osteoporosis: Secondary | ICD-10-CM | POA: Insufficient documentation

## 2013-09-18 ENCOUNTER — Encounter (HOSPITAL_COMMUNITY): Payer: Self-pay | Admitting: Emergency Medicine

## 2013-09-18 ENCOUNTER — Emergency Department (HOSPITAL_COMMUNITY)
Admission: EM | Admit: 2013-09-18 | Discharge: 2013-09-18 | Disposition: A | Discharge status: Home / Self Care | Payer: Medicare Other | Attending: Emergency Medicine | Admitting: Emergency Medicine

## 2013-09-18 DIAGNOSIS — Z853 Personal history of malignant neoplasm of breast: Secondary | ICD-10-CM | POA: Diagnosis not present

## 2013-09-18 DIAGNOSIS — Z792 Long term (current) use of antibiotics: Secondary | ICD-10-CM | POA: Diagnosis not present

## 2013-09-18 DIAGNOSIS — K029 Dental caries, unspecified: Secondary | ICD-10-CM | POA: Insufficient documentation

## 2013-09-18 DIAGNOSIS — I252 Old myocardial infarction: Secondary | ICD-10-CM | POA: Diagnosis not present

## 2013-09-18 DIAGNOSIS — Z8739 Personal history of other diseases of the musculoskeletal system and connective tissue: Secondary | ICD-10-CM | POA: Diagnosis not present

## 2013-09-18 DIAGNOSIS — Z8719 Personal history of other diseases of the digestive system: Secondary | ICD-10-CM | POA: Insufficient documentation

## 2013-09-18 DIAGNOSIS — Z7982 Long term (current) use of aspirin: Secondary | ICD-10-CM | POA: Diagnosis not present

## 2013-09-18 DIAGNOSIS — H269 Unspecified cataract: Secondary | ICD-10-CM | POA: Insufficient documentation

## 2013-09-18 DIAGNOSIS — K089 Disorder of teeth and supporting structures, unspecified: Secondary | ICD-10-CM | POA: Insufficient documentation

## 2013-09-18 DIAGNOSIS — Z79899 Other long term (current) drug therapy: Secondary | ICD-10-CM | POA: Diagnosis not present

## 2013-09-18 DIAGNOSIS — F17200 Nicotine dependence, unspecified, uncomplicated: Secondary | ICD-10-CM | POA: Insufficient documentation

## 2013-09-18 MED ORDER — PENICILLIN V POTASSIUM 500 MG PO TABS: 1000.0000 mg | ORAL_TABLET | Freq: Two times a day (BID) | ORAL | Status: DC

## 2013-09-18 NOTE — Discharge Instructions (Signed)
Dental Caries Dental caries is tooth decay. This decay can cause a hole in teeth (cavity) that can get bigger and deeper over time. HOME CARE  Brush and floss your teeth. Do this at least two times a day.  Use a fluoride toothpaste.  Use a mouth rinse if told by your dentist or doctor.  Eat less sugary and starchy foods. Drink less sugary drinks.  Avoid snacking often on sugary and starchy foods. Avoid sipping often on sugary drinks.  Keep regular checkups and cleanings with your dentist.  Use fluoride supplements if told by your dentist or doctor.  Allow fluoride to be applied to teeth if told by your dentist or doctor. Document Released: 10/09/2007 Document Revised: 05/16/2013 Document Reviewed: 01/02/2012 [facility] Patient Information 2015 [place], [place]. This information is not intended to replace advice given to you by your health care provider. Make sure you discuss any questions you have with your health care provider.  Dental Pain Toothache is pain in or around a tooth. It may get worse with chewing or with cold or heat.  HOME CARE  Your dentist may use a numbing medicine during treatment. If so, you may need to avoid eating until the medicine wears off. Ask your dentist about this.  Only take medicine as told by your dentist or doctor.  Avoid chewing food near the painful tooth until after all treatment is done. Ask your dentist about this. GET HELP RIGHT AWAY IF:   The problem gets worse or new problems appear.  You have a fever.  There is redness and puffiness (swelling) of the face, jaw, or neck.  You cannot open your mouth.  There is pain in the jaw.  There is very bad pain that is not helped by medicine. MAKE SURE YOU:   Understand these instructions.  Will watch your condition.  Will get help right away if you are not doing well or get worse. Document Released: 06/18/2007 Document Revised: 03/24/2011 Document Reviewed: 06/18/2007 [facility] Patient  Information 2015 [place], [place]. This information is not intended to replace advice given to you by your health care provider. Make sure you discuss any questions you have with your health care provider.

## 2013-09-18 NOTE — ED Provider Notes (Signed)
CSN: [id number]     Arrival date & time 09/18/13  1441 History   First MD Initiated Contact with Patient 09/18/13 1501     Chief Complaint  Patient presents with  . Dental Pain     (Consider location/radiation/quality/duration/timing/severity/associated sxs/prior Treatment) HPI 2 days gradual onset constant mild dental pain tooth #29 region no trauma no fever no drooling no stridor no voice change no neck swelling no neck pain no other concerns with no treatment prior to arrival. Past Medical History  Diagnosis Date  . Osteoporosis   . Bilateral cataracts   . GERD (gastroesophageal reflux disease)   . Acute MI   . Breast cancer   . Adenocarcinoma of left breast 11/23/2012    Stage II (T1 C. N1 M0) adenocarcinoma of the left breast with surgery on 03/08/2001. She had a 1.5 cm cancer with 1/6 positive sentinel nodes. Reexcision margins were clear. She did participate in NSABP B.-30 randomized to Adriamycin and Taxotere for 4 cycles followed by radiation therapy from 07/13/2001 to 08/27/2001. She then started Arimidex on 07/07/2001 took that until the end of June 2008. T   Past Surgical History  Procedure Laterality Date  . Breast surgery    . Abdominal hysterectomy    . Mastectomy, partial    . Portacath placement    . Port-a-cath removal    . Percutaneous stent intervention     Family History  Problem Relation Age of Onset  . Stroke Mother    History  Substance Use Topics  . Smoking status: Current Every Day Smoker -- 0.50 packs/day    Types: Cigarettes  . Smokeless tobacco: Never Used  . Alcohol Use: No   OB History   Grav Para Term Preterm Abortions TAB SAB Ect Mult Living                 Review of Systems 10 Systems reviewed and are negative for acute change except as noted in the HPI.   Allergies  Azithromycin  Home Medications   Prior to Admission medications   Medication Sig Start Date End Date Taking? Authorizing Provider  aspirin EC 81 MG EC tablet Take  1 tablet (81 mg total) by mouth daily. 12/24/12   Clent Demark, MD  atorvastatin (LIPITOR) 80 MG tablet Take 80 mg by mouth every evening.    Historical Provider, MD  CARTIA XT 180 MG 24 hr capsule Take 180 mg by mouth daily. 05/02/13   Historical Provider, MD  Cetirizine HCl (ZYRTEC ALLERGY) 10 MG CAPS Take 1 capsule (10 mg total) by mouth daily. 05/03/13   Janice Norrie, MD  dextromethorphan-guaiFENesin (MUCINEX DM) 30-600 MG per 12 hr tablet Take 1 tablet by mouth 2 (two) times daily as needed for cough. 05/03/13   Janice Norrie, MD  doxycycline (VIBRAMYCIN) 100 MG capsule Take 1 capsule (100 mg total) by mouth 2 (two) times daily. 05/03/13   Janice Norrie, MD  nitroGLYCERIN (NITROSTAT) 0.4 MG SL tablet Place 1 tablet (0.4 mg total) under the tongue every 5 (five) minutes x 3 doses as needed for chest pain. 12/24/12   Clent Demark, MD  penicillin v potassium (VEETID) 500 MG tablet Take 2 tablets (1,000 mg total) by mouth 2 (two) times daily. X 7 days 09/18/13   Babette Relic, MD  ranitidine (ZANTAC) 150 MG tablet Take 75 mg by mouth 2 (two) times daily as needed for heartburn. Take 1/2 tablet two times daily as needed for Acid reflux.  Historical Provider, MD  Ticagrelor (BRILINTA) 90 MG TABS tablet Take 1 tablet (90 mg total) by mouth 2 (two) times daily. 12/24/12   Clent Demark, MD   BP 134/67  Pulse 102  Temp(Src) 98.8 F (37.1 C) (Oral)  Resp 18  Ht 5' 1.5" (1.562 m)  Wt 118 lb (53.524 kg)  BMI 21.94 kg/m2  SpO2 99% Physical Exam  Nursing note and vitals reviewed. Constitutional:  Awake, alert, nontoxic appearance.  HENT:  Head: Atraumatic.  Mouth/Throat: Oropharynx is clear and moist. No oropharyngeal exudate.  Poor dentition with multiple prior extracted teeth with tooth #29 with large dental carry with localized percussion tenderness localized gingival tenderness without gingival swelling or purulent drainage with no trismus no drooling no stridor no facial erythema but minimal  facial swelling and minimal facial tenderness present localized to the patient's area of pain with no neck swelling  Eyes: Right eye exhibits no discharge. Left eye exhibits no discharge.  Neck: Neck supple.  Cardiovascular: Normal rate and regular rhythm.   No murmur heard. Pulmonary/Chest: Effort normal and breath sounds normal. No stridor. No respiratory distress. She has no wheezes. She has no rales. She exhibits no tenderness.  Abdominal: Soft. There is no tenderness. There is no rebound.  Musculoskeletal: She exhibits no tenderness.  Baseline ROM, no obvious new focal weakness.  Neurological: She is alert.  Mental status and motor strength appears baseline for patient and situation.  Skin: No rash noted.  Psychiatric: She has a normal mood and affect.    ED Course  Procedures (including critical care time) Labs Review Labs Reviewed - No data to display  Imaging Review No results found.   EKG Interpretation None      MDM   Final diagnoses:  Pain due to dental caries    Patient / Family / Caregiver informed of clinical course, understand medical decision-making process, and agree with plan. I doubt any other EMC precluding discharge at this time including, but not necessarily limited to the following:deep space neck infection.   Babette Relic, MD 09/19/13 2107

## 2013-09-18 NOTE — ED Notes (Signed)
PT c/o dental pain and swelling to the right upper teeth x1 day after brushing her teeth yesterday evening.

## 2013-11-20 NOTE — Progress Notes (Signed)
Margaret Thompson., MD [place] [address]  Adenocarcinoma of left breast  CURRENT THERAPY: Surveillance per NCCN guidelines.   INTERVAL HISTORY: Margaret Thompson 80 y.o. female returns for  regular  visit for followup of Stage II (T1 C. N1 M0) adenocarcinoma of the left breast with surgery on 03/08/2001. She had a 1.5 cm cancer with 1/6 positive sentinel nodes. Reexcision margins were clear. She did participate in NSABP B.-30 randomized to Adriamycin and Taxotere for 4 cycles followed by radiation therapy from 07/13/2001 to 08/27/2001. She then started Arimidex on 07/07/2001 took that until the end of June 2008. Thus far she has no evidence for recurrence.  I personally reviewed and went over laboratory results with the patient.  The results are noted within this dictation.  She reports that she had labs performed recently at her primary physician's office.  We will recruit those results.  There is no role for labs today from an oncology standpoint.  I personally reviewed and went over radiographic studies with the patient.  The results are noted within this dictation.    She had an acute inferoposterior wall MI and this was stented on 12/21/2012 by Dr. Terrence Dupont.  Oncologically, she denies any complaints and ROS questioning is negative.  Past Medical History  Diagnosis Date  . Osteoporosis   . Bilateral cataracts   . GERD (gastroesophageal reflux disease)   . Acute MI   . Breast cancer   . Adenocarcinoma of left breast 11/23/2012    Stage II (T1 C. N1 M0) adenocarcinoma of the left breast with surgery on 03/08/2001. She had a 1.5 cm cancer with 1/6 positive sentinel nodes. Reexcision margins were clear. She did participate in NSABP B.-30 randomized to Adriamycin and Taxotere for 4 cycles followed by radiation therapy from 07/13/2001 to 08/27/2001. She then started Arimidex on 07/07/2001 took that until the end of June 2008. T    has Bursitis, shoulder;  Adenocarcinoma of left breast; and Acute MI, inferoposterior wall on her problem list.     is allergic to azithromycin.  Margaret Thompson does not currently have medications on file.  Past Surgical History  Procedure Laterality Date  . Breast surgery    . Abdominal hysterectomy    . Mastectomy, partial    . Portacath placement    . Port-a-cath removal    . Percutaneous stent intervention      Denies any headaches, dizziness, double vision, fevers, chills, night sweats, nausea, vomiting, diarrhea, constipation, chest pain, heart palpitations, shortness of breath, blood in stool, black tarry stool, urinary pain, urinary burning, urinary frequency, hematuria.   PHYSICAL EXAMINATION  ECOG PERFORMANCE STATUS: 0 - Asymptomatic  Filed Vitals:   11/23/13 1400  BP: 135/76  Pulse: 108  Temp: 98.4 F (36.9 C)  Resp: 18    GENERAL:alert, no distress, well nourished, well developed, comfortable, cooperative, smiling and chronically-ill appearing with thickened appearing facial skin. SKIN: skin color, texture, turgor are normal, no rashes or significant lesions HEAD: Normocephalic, No masses, lesions, tenderness or abnormalities EYES: normal, PERRLA, EOMI, Conjunctiva are pink and non-injected EARS: External ears normal OROPHARYNX:lips, buccal mucosa, and tongue normal and mucous membranes are moist  NECK: supple, no adenopathy, thyroid normal size, non-tender, without nodularity, no stridor, non-tender, trachea midline LYMPH:  no palpable lymphadenopathy, no hepatosplenomegaly BREAST:breasts appear normal, no suspicious masses, no skin or nipple changes or axillary nodes LUNGS: decreased breath sounds HEART: regular rate & rhythm, no murmurs, no gallops, S1 normal and S2  normal ABDOMEN:abdomen soft, non-tender, normal bowel sounds, no masses or organomegaly and no hepatosplenomegaly BACK: Back symmetric, no curvature. EXTREMITIES:less then 2 second capillary refill, no joint deformities,  effusion, or inflammation, no edema, no skin discoloration, no cyanosis  NEURO: alert & oriented x 3 with fluent speech, no focal motor/sensory deficits, gait normal   LABORATORY DATA: CBC    Component Value Date/Time   WBC 8.4 12/24/2012 0710   RBC 3.99 12/24/2012 0710   HGB 12.5 12/24/2012 0710   HCT 37.9 12/24/2012 0710   PLT 237 12/24/2012 0710   MCV 95.0 12/24/2012 0710   MCH 31.3 12/24/2012 0710   MCHC 33.0 12/24/2012 0710   RDW 12.7 12/24/2012 0710   LYMPHSABS 3.0 04/21/2012 1024   MONOABS 0.6 04/21/2012 1024   EOSABS 0.2 04/21/2012 1024   BASOSABS 0.0 04/21/2012 1024      Chemistry      Component Value Date/Time   NA 140 12/24/2012 0710   K 4.2 12/24/2012 0710   CL 105 12/24/2012 0710   CO2 26 12/24/2012 0710   BUN 7 12/24/2012 0710   CREATININE 0.85 12/24/2012 0710      Component Value Date/Time   CALCIUM 9.1 12/24/2012 0710   ALKPHOS 67 12/24/2012 0710   AST 27 12/24/2012 0710   ALT 10 12/24/2012 0710   BILITOT 0.7 12/24/2012 0710        ASSESSMENT:  1. Stage II (T1 C. N1 M0) adenocarcinoma of the left breast with surgery on 03/08/2001. She had a 1.5 cm cancer with 1/6 positive sentinel nodes. Reexcision margins were clear. She did participate in NSABP B.-30 randomized to Adriamycin and Taxotere for 4 cycles followed by radiation therapy from 07/13/2001 to 08/27/2001. She then started Arimidex on 07/07/2001 took that until the end of June 2008. Thus far she has no evidence for recurrence  2. Neurodermatitis  3. Skin lesion on the right ear  4. Dry skin  5. COPD still smoking half a pack of cigarettes a day. She has been a very heavy smoker her life.  6. Zithromax allergy-urticaria  7. Tonsillectomy at age 62  8. Osteoporosis on therapy, followed by primary care provider  Patient Active Problem List   Diagnosis Date Noted  . Acute MI, inferoposterior wall 12/21/2012  . Adenocarcinoma of left breast 11/23/2012  . Bursitis, shoulder 05/25/2012      PLAN:  1. I personally reviewed and went over laboratory results with the patient. 2. I personally reviewed and went over radiographic studies with the patient. 3. Next screening mammogram is due in Feb 2016.  4. Smoking cessation education provided 5. No need for labs today 6. Follow-up with primary care provider as directed 7. Return in 1 year for follow-up   THERAPY PLAN:  NCCN guidelines recommends the following surveillance for invasive breast cancer:  A. History and Physical exam every 4-6 months for 5 years and then every 12 months.  B. Mammography every 12 months  C. Women on Tamoxifen: annual gynecologic assessment every 12 months if uterus is present.  D. Women on aromatase inhibitor or who experience ovarian failure secondary to treatment should have monitoring of bone health with a bone mineral density determination at baseline and periodically thereafter.  E. Assess and encourage adherence to adjuvant endocrine therapy.  F. Evidence suggests that active lifestyle and achieving and maintaining an ideal body weight (20-25 BMI) may lead to optimal breast cancer outcomes.   All questions were answered. The patient knows to call the clinic with  any problems, questions or concerns. We can certainly see the patient much sooner if necessary.  Patient and plan discussed with Dr. Farrel Gobble and he is in agreement with the aforementioned.    KEFALAS,THOMAS 11/23/2013

## 2013-11-23 ENCOUNTER — Encounter (HOSPITAL_COMMUNITY): Payer: Medicare Other | Attending: Oncology | Admitting: Oncology

## 2013-11-23 ENCOUNTER — Encounter (HOSPITAL_COMMUNITY): Payer: Self-pay | Admitting: Oncology

## 2013-11-23 VITALS — BP 135/76 | HR 108 | Temp 98.4°F | Resp 18 | Wt 118.9 lb

## 2013-11-23 DIAGNOSIS — M810 Age-related osteoporosis without current pathological fracture: Secondary | ICD-10-CM

## 2013-11-23 DIAGNOSIS — Z853 Personal history of malignant neoplasm of breast: Secondary | ICD-10-CM

## 2013-11-23 DIAGNOSIS — J449 Chronic obstructive pulmonary disease, unspecified: Secondary | ICD-10-CM

## 2013-11-23 DIAGNOSIS — C50912 Malignant neoplasm of unspecified site of left female breast: Secondary | ICD-10-CM

## 2013-11-23 DIAGNOSIS — I252 Old myocardial infarction: Secondary | ICD-10-CM

## 2013-11-23 NOTE — Patient Instructions (Signed)
[facility]charge Instructions  RECOMMENDATIONS MADE BY THE CONSULTANT AND ANY TEST RESULTS WILL BE SENT TO YOUR REFERRING PHYSICIAN.  You'll have your mammogram in February 2016. Continue to follow-up with your primary care physician as directed. Return to the [facility] in 12 months time for follow-up, sooner if needed.   Please call the [facility] if any new lumps or bumps are found in the breast or any concerns arise regarding your history of breast cancer.   Thank you for choosing [facility] to provide your oncology and hematology care.  To afford each patient quality time with our providers, please arrive at least 15 minutes before your scheduled appointment time.  With your help, our goal is to use those 15 minutes to complete the necessary work-up to ensure our physicians have the information they need to help with your evaluation and healthcare recommendations.    Effective January 1st, 2014, we ask that you re-schedule your appointment with our physicians should you arrive 10 or more minutes late for your appointment.  We strive to give you quality time with our providers, and arriving late affects you and other patients whose appointments are after yours.    Again, thank you for choosing [facility].  Our hope is that these requests will decrease the amount of time that you wait before being seen by our physicians.       _____________________________________________________________  Should you have questions after your visit to [facility], please contact our office at [phone] between the hours of 8:30 a.m. and 5:00 p.m.  Voicemails left after 4:30 p.m. will not be returned until the following business day.  For prescription refill requests, have your pharmacy contact our office with your prescription refill request.

## 2013-12-22 ENCOUNTER — Encounter (HOSPITAL_COMMUNITY): Payer: Self-pay | Admitting: Cardiology

## 2014-02-08 ENCOUNTER — Encounter (HOSPITAL_COMMUNITY): Payer: Self-pay | Admitting: Cardiology

## 2014-02-08 ENCOUNTER — Emergency Department (HOSPITAL_COMMUNITY)
Admission: EM | Admit: 2014-02-08 | Discharge: 2014-02-08 | Disposition: A | Discharge status: Home / Self Care | Payer: Medicare Other | Attending: Emergency Medicine | Admitting: Emergency Medicine

## 2014-02-08 ENCOUNTER — Emergency Department (HOSPITAL_COMMUNITY): Payer: Medicare Other

## 2014-02-08 DIAGNOSIS — H269 Unspecified cataract: Secondary | ICD-10-CM | POA: Diagnosis not present

## 2014-02-08 DIAGNOSIS — Z7982 Long term (current) use of aspirin: Secondary | ICD-10-CM | POA: Diagnosis not present

## 2014-02-08 DIAGNOSIS — R05 Cough: Secondary | ICD-10-CM

## 2014-02-08 DIAGNOSIS — Z720 Tobacco use: Secondary | ICD-10-CM | POA: Insufficient documentation

## 2014-02-08 DIAGNOSIS — Z79899 Other long term (current) drug therapy: Secondary | ICD-10-CM | POA: Insufficient documentation

## 2014-02-08 DIAGNOSIS — K219 Gastro-esophageal reflux disease without esophagitis: Secondary | ICD-10-CM | POA: Diagnosis not present

## 2014-02-08 DIAGNOSIS — I252 Old myocardial infarction: Secondary | ICD-10-CM | POA: Diagnosis not present

## 2014-02-08 DIAGNOSIS — Z853 Personal history of malignant neoplasm of breast: Secondary | ICD-10-CM | POA: Diagnosis not present

## 2014-02-08 DIAGNOSIS — R059 Cough, unspecified: Secondary | ICD-10-CM

## 2014-02-08 DIAGNOSIS — J069 Acute upper respiratory infection, unspecified: Secondary | ICD-10-CM | POA: Insufficient documentation

## 2014-02-08 DIAGNOSIS — R067 Sneezing: Secondary | ICD-10-CM | POA: Diagnosis not present

## 2014-02-08 DIAGNOSIS — Z9889 Other specified postprocedural states: Secondary | ICD-10-CM | POA: Diagnosis not present

## 2014-02-08 MED ORDER — FLUTICASONE PROPIONATE 50 MCG/ACT NA SUSP: 1.0000 | Freq: Every day | NASAL | Status: DC

## 2014-02-08 MED ORDER — BENZONATATE 100 MG PO CAPS: 100.0000 mg | ORAL_CAPSULE | Freq: Three times a day (TID) | ORAL | Status: DC

## 2014-02-08 NOTE — ED Notes (Signed)
Cough and head congestion times 3 days.

## 2014-02-08 NOTE — Discharge Instructions (Signed)
Upper Respiratory Infection, Adult An upper respiratory infection (URI) is also sometimes known as the common cold. The upper respiratory tract includes the nose, sinuses, throat, trachea, and bronchi. Bronchi are the airways leading to the lungs. Most people improve within 1 week, but symptoms can last up to 2 weeks. A residual cough may last even longer.  CAUSES Many different viruses can infect the tissues lining the upper respiratory tract. The tissues become irritated and inflamed and often become very moist. Mucus production is also common. A cold is contagious. You can easily spread the virus to others by oral contact. This includes kissing, sharing a glass, coughing, or sneezing. Touching your mouth or nose and then touching a surface, which is then touched by another person, can also spread the virus. SYMPTOMS  Symptoms typically develop 1 to 3 days after you come in contact with a cold virus. Symptoms vary from person to person. They may include:  Runny nose.  Sneezing.  Nasal congestion.  Sinus irritation.  Sore throat.  Loss of voice (laryngitis).  Cough.  Fatigue.  Muscle aches.  Loss of appetite.  Headache.  Low-grade fever. DIAGNOSIS  You might diagnose your own cold based on familiar symptoms, since most people get a cold 2 to 3 times a year. Your caregiver can confirm this based on your exam. Most importantly, your caregiver can check that your symptoms are not due to another disease such as strep throat, sinusitis, pneumonia, asthma, or epiglottitis. Blood tests, throat tests, and X-rays are not necessary to diagnose a common cold, but they may sometimes be helpful in excluding other more serious diseases. Your caregiver will decide if any further tests are required. RISKS AND COMPLICATIONS  You may be at risk for a more severe case of the common cold if you smoke cigarettes, have chronic heart disease (such as heart failure) or lung disease (such as asthma), or if  you have a weakened immune system. The very young and very old are also at risk for more serious infections. Bacterial sinusitis, middle ear infections, and bacterial pneumonia can complicate the common cold. The common cold can worsen asthma and chronic obstructive pulmonary disease (COPD). Sometimes, these complications can require emergency medical care and may be life-threatening. PREVENTION  The best way to protect against getting a cold is to practice good hygiene. Avoid oral or hand contact with people with cold symptoms. Wash your hands often if contact occurs. There is no clear evidence that vitamin C, vitamin E, echinacea, or exercise reduces the chance of developing a cold. However, it is always recommended to get plenty of rest and practice good nutrition. TREATMENT  Treatment is directed at relieving symptoms. There is no cure. Antibiotics are not effective, because the infection is caused by a virus, not by bacteria. Treatment may include:  Increased fluid intake. Sports drinks offer valuable electrolytes, sugars, and fluids.  Breathing heated mist or steam (vaporizer or shower).  Eating chicken soup or other clear broths, and maintaining good nutrition.  Getting plenty of rest.  Using gargles or lozenges for comfort.  Controlling fevers with ibuprofen or acetaminophen as directed by your caregiver.  Increasing usage of your inhaler if you have asthma. Zinc gel and zinc lozenges, taken in the first 24 hours of the common cold, can shorten the duration and lessen the severity of symptoms. Pain medicines may help with fever, muscle aches, and throat pain. A variety of non-prescription medicines are available to treat congestion and runny nose. Your caregiver   can make recommendations and may suggest nasal or lung inhalers for other symptoms.  HOME CARE INSTRUCTIONS   Only take over-the-counter or prescription medicines for pain, discomfort, or fever as directed by your  caregiver.  Use a warm mist humidifier or inhale steam from a shower to increase air moisture. This may keep secretions moist and make it easier to breathe.  Drink enough water and fluids to keep your urine clear or pale yellow.  Rest as needed.  Return to work when your temperature has returned to normal or as your caregiver advises. You may need to stay home longer to avoid infecting others. You can also use a face mask and careful hand washing to prevent spread of the virus. SEEK MEDICAL CARE IF:   After the first few days, you feel you are getting worse rather than better.  You need your caregiver's advice about medicines to control symptoms.  You develop chills, worsening shortness of breath, or brown or red sputum. These may be signs of pneumonia.  You develop yellow or brown nasal discharge or pain in the face, especially when you bend forward. These may be signs of sinusitis.  You develop a fever, swollen neck glands, pain with swallowing, or white areas in the back of your throat. These may be signs of strep throat. SEEK IMMEDIATE MEDICAL CARE IF:   You have a fever.  You develop severe or persistent headache, ear pain, sinus pain, or chest pain.  You develop wheezing, a prolonged cough, cough up blood, or have a change in your usual mucus (if you have chronic lung disease).  You develop sore muscles or a stiff neck. Document Released: 06/25/2000 Document Revised: 03/24/2011 Document Reviewed: 04/06/2013 Exit[employer] Patient Information 2015 [employer]. This information is not intended to replace advice given to you by your health care provider. Make sure you discuss any questions you have with your health care provider.  

## 2014-02-08 NOTE — ED Provider Notes (Signed)
CSN: [id number]     Arrival date & time 02/08/14  1230 History  This chart was scribed for Orpah Greek, by Lowella Petties, ED Scribe. The patient was seen in room APA01/APA01. Patient's care was started at 12:41 PM.     Chief Complaint  Patient presents with  . Cough   The history is provided by the patient. No language interpreter was used.   HPI Comments: Margaret Thompson is a 81 y.o. female with a history of breast cancer and MI who presents to the Emergency Department complaining of constant, moderate, head congestion and a cough productive of clear sputum, which began 3 days ago. She states that she may just have a cold, but she came in to make sure nothing worse is going on. She denies sore throat.     Past Medical History  Diagnosis Date  . Osteoporosis   . Bilateral cataracts   . GERD (gastroesophageal reflux disease)   . Acute MI   . Breast cancer   . Adenocarcinoma of left breast 11/23/2012    Stage II (T1 C. N1 M0) adenocarcinoma of the left breast with surgery on 03/08/2001. She had a 1.5 cm cancer with 1/6 positive sentinel nodes. Reexcision margins were clear. She did participate in NSABP B.-30 randomized to Adriamycin and Taxotere for 4 cycles followed by radiation therapy from 07/13/2001 to 08/27/2001. She then started Arimidex on 07/07/2001 took that until the end of June 2008. T   Past Surgical History  Procedure Laterality Date  . Breast surgery    . Abdominal hysterectomy    . Mastectomy, partial    . Portacath placement    . Port-a-cath removal    . Percutaneous stent intervention    . Left heart catheterization with coronary angiogram N/A 12/21/2012    Procedure: LEFT HEART CATHETERIZATION WITH CORONARY ANGIOGRAM;  Surgeon: Clent Demark, MD;  Location: [facility] CATH LAB;  Service: Cardiovascular;  Laterality: N/A;   Family History  Problem Relation Age of Onset  . Stroke Mother    History  Substance Use Topics  . Smoking status: Current Every Day Smoker  -- 0.50 packs/day    Types: Cigarettes  . Smokeless tobacco: Never Used  . Alcohol Use: No   OB History    No data available     Review of Systems  Constitutional: Negative for fever and chills.  HENT: Positive for congestion and rhinorrhea. Negative for sore throat.   Respiratory: Positive for cough. Negative for shortness of breath.   Cardiovascular: Negative for chest pain.  All other systems reviewed and are negative.  Allergies  Azithromycin  Home Medications   Prior to Admission medications   Medication Sig Start Date End Date Taking? Authorizing Provider  aspirin EC 81 MG EC tablet Take 1 tablet (81 mg total) by mouth daily. 12/24/12  Yes Clent Demark, MD  atorvastatin (LIPITOR) 80 MG tablet Take 80 mg by mouth every evening.   Yes Historical Provider, MD  CARTIA XT 180 MG 24 hr capsule Take 180 mg by mouth daily. 05/02/13  Yes Historical Provider, MD  nitroGLYCERIN (NITROSTAT) 0.4 MG SL tablet Place 1 tablet (0.4 mg total) under the tongue every 5 (five) minutes x 3 doses as needed for chest pain. 12/24/12  Yes Clent Demark, MD  ranitidine (ZANTAC) 150 MG tablet Take 75 mg by mouth 2 (two) times daily as needed for heartburn. Take 1/2 tablet two times daily as needed for Acid reflux.   Yes  Historical Provider, MD  Ticagrelor (BRILINTA) 90 MG TABS tablet Take 1 tablet (90 mg total) by mouth 2 (two) times daily. 12/24/12  Yes Clent Demark, MD  Cetirizine HCl (ZYRTEC ALLERGY) 10 MG CAPS Take 1 capsule (10 mg total) by mouth daily. Patient not taking: Reported on 02/08/2014 05/03/13   Janice Norrie, MD  dextromethorphan-guaiFENesin [facility] DM) 30-600 MG per 12 hr tablet Take 1 tablet by mouth 2 (two) times daily as needed for cough. Patient not taking: Reported on 02/08/2014 05/03/13   Janice Norrie, MD  doxycycline (VIBRAMYCIN) 100 MG capsule Take 1 capsule (100 mg total) by mouth 2 (two) times daily. Patient not taking: Reported on 02/08/2014 05/03/13   Janice Norrie, MD   penicillin v potassium (VEETID) 500 MG tablet Take 2 tablets (1,000 mg total) by mouth 2 (two) times daily. X 7 days Patient not taking: Reported on 02/08/2014 09/18/13   Babette Relic, MD   Triage Vitals: BP 166/69 mmHg  Pulse 114  Temp(Src) 98.1 F (36.7 C) (Oral)  Resp 18  Ht 5\' 2"  (1.575 m)  Wt 118 lb (53.524 kg)  BMI 21.58 kg/m2 Physical Exam  Constitutional: She is oriented to person, place, and time. She appears well-developed and well-nourished. No distress.  HENT:  Head: Normocephalic and atraumatic.  Right Ear: Hearing normal.  Left Ear: Hearing normal.  Nose: Rhinorrhea present.  Mouth/Throat: Oropharynx is clear and moist and mucous membranes are normal.  Nasal congestion  Eyes: Conjunctivae and EOM are normal. Pupils are equal, round, and reactive to light.  Neck: Normal range of motion. Neck supple.  Cardiovascular: Normal rate, regular rhythm, S1 normal and S2 normal.  Exam reveals no gallop and no friction rub.   No murmur heard. Pulmonary/Chest: Effort normal and breath sounds normal. No respiratory distress. She has no wheezes. She exhibits no tenderness.  Abdominal: Soft. Normal appearance and bowel sounds are normal. There is no hepatosplenomegaly. There is no tenderness. There is no rebound, no guarding, no tenderness at McBurney's point and negative Murphy's sign. No hernia.  Musculoskeletal: Normal range of motion.  Neurological: She is alert and oriented to person, place, and time. She has normal strength. No cranial nerve deficit or sensory deficit. Coordination normal. GCS eye subscore is 4. GCS verbal subscore is 5. GCS motor subscore is 6.  Skin: Skin is warm, dry and intact. No rash noted. No cyanosis.  Psychiatric: She has a normal mood and affect. Her speech is normal and behavior is normal. Thought content normal.  Nursing note and vitals reviewed.   ED Course  Procedures (including critical care time)  COORDINATION OF CARE: 12:44 PM-Discussed  treatment plan which includes CXR with pt at bedside and pt agreed to plan.   Labs Review Labs Reviewed - No data to display  Imaging Review Dg Chest 2 View  02/08/2014   CLINICAL DATA:  Cough and sneezing  EXAM: CHEST  2 VIEW  COMPARISON:  05/03/2013  FINDINGS: The heart size and mediastinal contours are within normal limits. Calcified atherosclerotic plaque involves the thoracic aorta. Both lungs are clear. The visualized skeletal structures are unremarkable.  IMPRESSION: No active cardiopulmonary disease.   Electronically Signed   By: Kerby Moors M.D.   On: 02/08/2014 13:38     EKG Interpretation None      MDM   Final diagnoses:  Cough   Patient presents to the ER for evaluation of cold symptoms. Symptoms present for 2 or 3 days. Patient reports  nasal congestion, cough. Lungs are clear on auscultation and chest x-ray does not show any evidence of pneumonia. Vital signs are stable. Patient appears comfortable. Patient was reassured, symptomatic therapy recommended.  I personally performed the services described in this documentation, which was scribed in my presence. The recorded information has been reviewed and is accurate.     Orpah Greek, MD 02/08/14 1400

## 2014-03-01 ENCOUNTER — Encounter (HOSPITAL_COMMUNITY): Payer: Self-pay | Admitting: *Deleted

## 2014-03-01 ENCOUNTER — Emergency Department (HOSPITAL_COMMUNITY): Payer: Medicare Other

## 2014-03-01 ENCOUNTER — Emergency Department (HOSPITAL_COMMUNITY)
Admission: EM | Admit: 2014-03-01 | Discharge: 2014-03-01 | Disposition: A | Discharge status: Home / Self Care | Payer: Medicare Other | Attending: Emergency Medicine | Admitting: Emergency Medicine

## 2014-03-01 DIAGNOSIS — Z9889 Other specified postprocedural states: Secondary | ICD-10-CM | POA: Insufficient documentation

## 2014-03-01 DIAGNOSIS — Y9289 Other specified places as the place of occurrence of the external cause: Secondary | ICD-10-CM | POA: Insufficient documentation

## 2014-03-01 DIAGNOSIS — Z23 Encounter for immunization: Secondary | ICD-10-CM | POA: Diagnosis not present

## 2014-03-01 DIAGNOSIS — S61412A Laceration without foreign body of left hand, initial encounter: Secondary | ICD-10-CM | POA: Diagnosis not present

## 2014-03-01 DIAGNOSIS — W5501XA Bitten by cat, initial encounter: Secondary | ICD-10-CM | POA: Diagnosis not present

## 2014-03-01 DIAGNOSIS — K219 Gastro-esophageal reflux disease without esophagitis: Secondary | ICD-10-CM | POA: Insufficient documentation

## 2014-03-01 DIAGNOSIS — Y9389 Activity, other specified: Secondary | ICD-10-CM | POA: Diagnosis not present

## 2014-03-01 DIAGNOSIS — M1990 Unspecified osteoarthritis, unspecified site: Secondary | ICD-10-CM | POA: Diagnosis not present

## 2014-03-01 DIAGNOSIS — Z853 Personal history of malignant neoplasm of breast: Secondary | ICD-10-CM | POA: Insufficient documentation

## 2014-03-01 DIAGNOSIS — Z720 Tobacco use: Secondary | ICD-10-CM | POA: Diagnosis not present

## 2014-03-01 DIAGNOSIS — Z9861 Coronary angioplasty status: Secondary | ICD-10-CM | POA: Diagnosis not present

## 2014-03-01 DIAGNOSIS — H269 Unspecified cataract: Secondary | ICD-10-CM | POA: Diagnosis not present

## 2014-03-01 DIAGNOSIS — Z792 Long term (current) use of antibiotics: Secondary | ICD-10-CM | POA: Diagnosis not present

## 2014-03-01 DIAGNOSIS — M19042 Primary osteoarthritis, left hand: Secondary | ICD-10-CM | POA: Diagnosis not present

## 2014-03-01 DIAGNOSIS — I252 Old myocardial infarction: Secondary | ICD-10-CM | POA: Insufficient documentation

## 2014-03-01 DIAGNOSIS — S6992XA Unspecified injury of left wrist, hand and finger(s), initial encounter: Secondary | ICD-10-CM | POA: Diagnosis present

## 2014-03-01 DIAGNOSIS — S61452A Open bite of left hand, initial encounter: Secondary | ICD-10-CM | POA: Insufficient documentation

## 2014-03-01 DIAGNOSIS — Z7951 Long term (current) use of inhaled steroids: Secondary | ICD-10-CM | POA: Insufficient documentation

## 2014-03-01 DIAGNOSIS — Y998 Other external cause status: Secondary | ICD-10-CM | POA: Diagnosis not present

## 2014-03-01 DIAGNOSIS — Z7982 Long term (current) use of aspirin: Secondary | ICD-10-CM | POA: Diagnosis not present

## 2014-03-01 DIAGNOSIS — Z79899 Other long term (current) drug therapy: Secondary | ICD-10-CM | POA: Diagnosis not present

## 2014-03-01 MED ORDER — AMOXICILLIN-POT CLAVULANATE 875-125 MG PO TABS
1.0000 | ORAL_TABLET | Freq: Once | ORAL | Status: DC
Start: 2014-03-01 — End: 2014-03-01
  Filled 2014-03-01: qty 1

## 2014-03-01 MED ORDER — TETANUS-DIPHTH-ACELL PERTUSSIS 5-2.5-18.5 LF-MCG/0.5 IM SUSP
0.5000 mL | Freq: Once | INTRAMUSCULAR | Status: AC
  Administered 2014-03-01: 0.5 mL via INTRAMUSCULAR
  Filled 2014-03-01: qty 0.5

## 2014-03-01 MED ORDER — AMOXICILLIN-POT CLAVULANATE 875-125 MG PO TABS: 1.0000 | ORAL_TABLET | Freq: Two times a day (BID) | ORAL | Status: DC

## 2014-03-01 NOTE — ED Notes (Signed)
Pt states her cat bit her 2 days ago with a little swelling and soreness noticed to the left hand today.

## 2014-03-01 NOTE — Discharge Instructions (Signed)

## 2014-03-02 NOTE — ED Provider Notes (Signed)
CSN: [id number]     Arrival date & time 03/01/14  1100 History   First MD Initiated Contact with Patient 03/01/14 1120     Chief Complaint  Patient presents with  . Hand Injury     (Consider location/radiation/quality/duration/timing/severity/associated sxs/prior Treatment) The history is provided by the patient.   Margaret Thompson is a 81 y.o. right handed female presenting with left hand pain and now swelling since being bit by her cat 2 days ago.  Her cat is fully immunized including rabies and she denies a deep puncture wound, rather that he skimmed across her hand with a sharp tooth causing laceration.  She cleaned the wound with soap and water and woke today with increased pain and mild swelling on the hand dorsum.  She has mild tightness with finger flexion and extension, denies pain with this motion.  She is unsure of her tetanus status.     Past Medical History  Diagnosis Date  . Osteoporosis   . Bilateral cataracts   . GERD (gastroesophageal reflux disease)   . Acute MI   . Breast cancer   . Adenocarcinoma of left breast 11/23/2012    Stage II (T1 C. N1 M0) adenocarcinoma of the left breast with surgery on 03/08/2001. She had a 1.5 cm cancer with 1/6 positive sentinel nodes. Reexcision margins were clear. She did participate in NSABP B.-30 randomized to Adriamycin and Taxotere for 4 cycles followed by radiation therapy from 07/13/2001 to 08/27/2001. She then started Arimidex on 07/07/2001 took that until the end of June 2008. T   Past Surgical History  Procedure Laterality Date  . Breast surgery    . Abdominal hysterectomy    . Mastectomy, partial    . Portacath placement    . Port-a-cath removal    . Percutaneous stent intervention    . Left heart catheterization with coronary angiogram N/A 12/21/2012    Procedure: LEFT HEART CATHETERIZATION WITH CORONARY ANGIOGRAM;  Surgeon: Clent Demark, MD;  Location: [facility] CATH LAB;  Service: Cardiovascular;  Laterality: N/A;   Family  History  Problem Relation Age of Onset  . Stroke Mother    History  Substance Use Topics  . Smoking status: Current Every Day Smoker -- 0.50 packs/day    Types: Cigarettes  . Smokeless tobacco: Never Used  . Alcohol Use: No   OB History    No data available     Review of Systems  Constitutional: Negative for fever and chills.  Musculoskeletal: Positive for arthralgias. Negative for myalgias and joint swelling.  Skin: Positive for wound.  Neurological: Negative for weakness and numbness.      Allergies  Azithromycin  Home Medications   Prior to Admission medications   Medication Sig Start Date End Date Taking? Authorizing Provider  amoxicillin-clavulanate (AUGMENTIN) 875-125 MG per tablet Take 1 tablet by mouth every 12 (twelve) hours. 03/01/14   Evalee Jefferson, PA-C  aspirin EC 81 MG EC tablet Take 1 tablet (81 mg total) by mouth daily. 12/24/12   Clent Demark, MD  atorvastatin (LIPITOR) 80 MG tablet Take 80 mg by mouth every evening.    Historical Provider, MD  benzonatate (TESSALON) 100 MG capsule Take 1 capsule (100 mg total) by mouth every 8 (eight) hours. 02/08/14   Orpah Greek, MD  CARTIA XT 180 MG 24 hr capsule Take 180 mg by mouth daily. 05/02/13   Historical Provider, MD  fluticasone (FLONASE) 50 MCG/ACT nasal spray Place 1 spray into both nostrils daily. 02/08/14  Orpah Greek, MD  nitroGLYCERIN (NITROSTAT) 0.4 MG SL tablet Place 1 tablet (0.4 mg total) under the tongue every 5 (five) minutes x 3 doses as needed for chest pain. 12/24/12   Clent Demark, MD  ranitidine (ZANTAC) 150 MG tablet Take 75 mg by mouth 2 (two) times daily as needed for heartburn. Take 1/2 tablet two times daily as needed for Acid reflux.    Historical Provider, MD  Ticagrelor (BRILINTA) 90 MG TABS tablet Take 1 tablet (90 mg total) by mouth 2 (two) times daily. 12/24/12   Clent Demark, MD   BP 155/63 mmHg  Pulse 103  Temp(Src) 98.7 F (37.1 C) (Oral)  Resp 18  Ht  5\' 2"  (1.575 m)  Wt 118 lb (53.524 kg)  BMI 21.58 kg/m2  SpO2 98% Physical Exam  Constitutional: She is oriented to person, place, and time. She appears well-developed and well-nourished.  HENT:  Head: Normocephalic.  Cardiovascular: Normal rate.   Pulmonary/Chest: Effort normal.  Neurological: She is alert and oriented to person, place, and time. No sensory deficit.  Skin: Laceration noted.  Small 0.5 cm laceration left hand dorsum which is closed with scabbing present.  Mild surrounding erythema of hand dorsum not involving fingers, erythema 0.5 cm surrounding laceration, no red streaking, there is bruising. Skin is not warm. No fluctuance, no induration.    ED Course  Procedures (including critical care time) Labs Review Labs Reviewed - No data to display  Imaging Review Dg Hand Complete Left  03/01/2014   CLINICAL DATA:  Cat bite 2 days prior  EXAM: LEFT HAND - COMPLETE 3+ VIEW  COMPARISON:  None.  FINDINGS: Frontal, oblique, and lateral views were obtained. There is no fracture or dislocation. No erosive change or bony destruction. There is narrowing of all PIP and DIP joints. Small foci of calcification are noted in the second and third DIP joints. No soft tissue abscess seen. No radiopaque foreign body.  IMPRESSION: Areas of osteoarthritic change distally. No erosive change or bony destruction. No fracture or dislocation. No radiopaque foreign body.   Electronically Signed   By: Lowella Grip III M.D.   On: 03/01/2014 12:11     EKG Interpretation None      MDM   Final diagnoses:  Cat bite of hand, left, initial encounter    Patients labs and/or radiological studies were viewed and considered during the medical decision making and disposition process. Pt with mild edema, minimal erythema left dorsal hand.  Suspect this may be inflammatory reaction, not strongly suggesting hand infection, but patient to be covered with augmentin.  Tetanus updated. Advised f/u with pcp  for a recheck in 2 days or return here unless symptoms significantly improved.      Evalee Jefferson, PA-C 03/03/14 0815  Maudry Diego, MD 03/03/14 [phone]

## 2014-03-09 DIAGNOSIS — Z720 Tobacco use: Secondary | ICD-10-CM | POA: Diagnosis not present

## 2014-03-09 DIAGNOSIS — F17290 Nicotine dependence, other tobacco product, uncomplicated: Secondary | ICD-10-CM | POA: Diagnosis not present

## 2014-03-09 DIAGNOSIS — I2510 Atherosclerotic heart disease of native coronary artery without angina pectoris: Secondary | ICD-10-CM | POA: Diagnosis not present

## 2014-03-09 DIAGNOSIS — Z716 Tobacco abuse counseling: Secondary | ICD-10-CM | POA: Diagnosis not present

## 2014-03-09 DIAGNOSIS — I10 Essential (primary) hypertension: Secondary | ICD-10-CM | POA: Diagnosis not present

## 2014-05-02 ENCOUNTER — Other Ambulatory Visit (HOSPITAL_COMMUNITY): Payer: Self-pay | Admitting: Internal Medicine

## 2014-05-02 DIAGNOSIS — J449 Chronic obstructive pulmonary disease, unspecified: Secondary | ICD-10-CM | POA: Diagnosis not present

## 2014-05-02 DIAGNOSIS — Z1231 Encounter for screening mammogram for malignant neoplasm of breast: Secondary | ICD-10-CM

## 2014-05-02 DIAGNOSIS — K219 Gastro-esophageal reflux disease without esophagitis: Secondary | ICD-10-CM | POA: Diagnosis not present

## 2014-05-02 DIAGNOSIS — Z719 Counseling, unspecified: Secondary | ICD-10-CM | POA: Diagnosis not present

## 2014-05-02 DIAGNOSIS — E782 Mixed hyperlipidemia: Secondary | ICD-10-CM | POA: Diagnosis not present

## 2014-05-02 DIAGNOSIS — Z6822 Body mass index (BMI) 22.0-22.9, adult: Secondary | ICD-10-CM | POA: Diagnosis not present

## 2014-05-02 DIAGNOSIS — Z0001 Encounter for general adult medical examination with abnormal findings: Secondary | ICD-10-CM | POA: Diagnosis not present

## 2014-05-11 DIAGNOSIS — H5213 Myopia, bilateral: Secondary | ICD-10-CM | POA: Diagnosis not present

## 2014-05-11 DIAGNOSIS — H52223 Regular astigmatism, bilateral: Secondary | ICD-10-CM | POA: Diagnosis not present

## 2014-05-24 ENCOUNTER — Ambulatory Visit (HOSPITAL_COMMUNITY): Payer: Medicare Other

## 2014-06-01 ENCOUNTER — Ambulatory Visit (HOSPITAL_COMMUNITY)
Admission: RE | Admit: 2014-06-01 | Discharge: 2014-06-01 | Disposition: A | Discharge status: Home / Self Care | Payer: Medicare Other | Source: Ambulatory Visit | Attending: Internal Medicine | Admitting: Internal Medicine

## 2014-06-01 DIAGNOSIS — Z1231 Encounter for screening mammogram for malignant neoplasm of breast: Secondary | ICD-10-CM | POA: Insufficient documentation

## 2014-06-07 DIAGNOSIS — F17290 Nicotine dependence, other tobacco product, uncomplicated: Secondary | ICD-10-CM | POA: Diagnosis not present

## 2014-06-07 DIAGNOSIS — Z716 Tobacco abuse counseling: Secondary | ICD-10-CM | POA: Diagnosis not present

## 2014-06-07 DIAGNOSIS — I2510 Atherosclerotic heart disease of native coronary artery without angina pectoris: Secondary | ICD-10-CM | POA: Diagnosis not present

## 2014-06-07 DIAGNOSIS — I10 Essential (primary) hypertension: Secondary | ICD-10-CM | POA: Diagnosis not present

## 2014-06-07 DIAGNOSIS — Z720 Tobacco use: Secondary | ICD-10-CM | POA: Diagnosis not present

## 2014-07-12 DIAGNOSIS — Z681 Body mass index (BMI) 19 or less, adult: Secondary | ICD-10-CM | POA: Diagnosis not present

## 2014-07-12 DIAGNOSIS — R12 Heartburn: Secondary | ICD-10-CM | POA: Diagnosis not present

## 2014-07-12 DIAGNOSIS — M25512 Pain in left shoulder: Secondary | ICD-10-CM | POA: Diagnosis not present

## 2014-07-12 DIAGNOSIS — R1319 Other dysphagia: Secondary | ICD-10-CM | POA: Diagnosis not present

## 2014-07-12 DIAGNOSIS — Z1389 Encounter for screening for other disorder: Secondary | ICD-10-CM | POA: Diagnosis not present

## 2014-07-25 ENCOUNTER — Encounter: Payer: Self-pay | Admitting: Gastroenterology

## 2014-07-27 DIAGNOSIS — H25813 Combined forms of age-related cataract, bilateral: Secondary | ICD-10-CM | POA: Diagnosis not present

## 2014-07-27 DIAGNOSIS — H25812 Combined forms of age-related cataract, left eye: Secondary | ICD-10-CM | POA: Diagnosis not present

## 2014-08-02 ENCOUNTER — Encounter (HOSPITAL_COMMUNITY): Admission: RE | Admit: 2014-08-02 | Payer: Medicare Other | Source: Ambulatory Visit

## 2014-08-03 DIAGNOSIS — Z6822 Body mass index (BMI) 22.0-22.9, adult: Secondary | ICD-10-CM | POA: Diagnosis not present

## 2014-08-03 DIAGNOSIS — J42 Unspecified chronic bronchitis: Secondary | ICD-10-CM | POA: Diagnosis not present

## 2014-08-03 DIAGNOSIS — I2510 Atherosclerotic heart disease of native coronary artery without angina pectoris: Secondary | ICD-10-CM | POA: Diagnosis not present

## 2014-08-03 DIAGNOSIS — R7309 Other abnormal glucose: Secondary | ICD-10-CM | POA: Diagnosis not present

## 2014-08-03 DIAGNOSIS — Z79899 Other long term (current) drug therapy: Secondary | ICD-10-CM | POA: Diagnosis not present

## 2014-08-03 DIAGNOSIS — E782 Mixed hyperlipidemia: Secondary | ICD-10-CM | POA: Diagnosis not present

## 2014-08-21 ENCOUNTER — Ambulatory Visit: Payer: Medicare Other | Admitting: Gastroenterology

## 2014-08-25 NOTE — Patient Instructions (Signed)
Your procedure is scheduled on:  09/04/2014  Report to [facility] at  11:00    AM.  Call this number if you have problems the morning of surgery: [phone]   Remember:   Do not eat or drink :After Midnight.    Take these medicines the morning of surgery with A SIP OF WATER: Zantac, Ticagrelor  (Brilinta) and Cartia   Do not wear jewelry, make-up or nail polish.  Do not wear lotions, powders, or perfumes. You may wear deodorant.  Do not shave 48 hours prior to surgery.  Do not bring valuables to the hospital.  Contacts, dentures or bridgework may not be worn into surgery.  Patients discharged the day of surgery will not be allowed to drive home.  Name and phone number of your driver:    Please read over the following fact sheets that you were given: Pain Booklet, Surgical Site Infection Prevention, Anesthesia Post-op Instructions and Care and Recovery After Surgery  Cataract Surgery  A cataract is a clouding of the lens of the eye. When a lens becomes cloudy, vision is reduced based on the degree and nature of the clouding. Surgery may be needed to improve vision. Surgery removes the cloudy lens and usually replaces it with a substitute lens (intraocular lens, IOL). LET YOUR EYE DOCTOR KNOW ABOUT:  Allergies to food or medicine.   Medicines taken including herbs, eyedrops, over-the-counter medicines, and creams.   Use of steroids (by mouth or creams).   Previous problems with anesthetics or numbing medicine.   History of bleeding problems or blood clots.   Previous surgery.   Other health problems, including diabetes and kidney problems.   Possibility of pregnancy, if this applies.  RISKS AND COMPLICATIONS  Infection.   Inflammation of the eyeball (endophthalmitis) that can spread to both eyes (sympathetic ophthalmia).   Poor wound healing.   If an IOL is inserted, it can later fall out of proper position. This is very uncommon.   Clouding of the part of your eye  that holds an IOL in place. This is called an "after-cataract." These are uncommon, but easily treated.  BEFORE THE PROCEDURE  Do not eat or drink anything except small amounts of water for 8 to 12 before your surgery, or as directed by your caregiver.   Unless you are told otherwise, continue any eyedrops you have been prescribed.   Talk to your primary caregiver about all other medicines that you take (both prescription and non-prescription). In some cases, you may need to stop or change medicines near the time of your surgery. This is most important if you are taking blood-thinning medicine.Do not stop medicines unless you are told to do so.   Arrange for someone to drive you to and from the procedure.   Do not put contact lenses in either eye on the day of your surgery.  PROCEDURE There is more than one method for safely removing a cataract. Your doctor can explain the differences and help determine which is best for you. Phacoemulsification surgery is the most common form of cataract surgery.  An injection is given behind the eye or eyedrops are given to make this a painless procedure.   A small cut (incision) is made on the edge of the clear, dome-shaped surface that covers the front of the eye (cornea).   A tiny probe is painlessly inserted into the eye. This device gives off ultrasound waves that soften and break up the cloudy center of the lens.  This makes it easier for the cloudy lens to be removed by suction.   An IOL may be implanted.   The normal lens of the eye is covered by a clear capsule. Part of that capsule is intentionally left in the eye to support the IOL.   Your surgeon may or may not use stitches to close the incision.  There are other forms of cataract surgery that require a larger incision and stiches to close the eye. This approach is taken in cases where the doctor feels that the cataract cannot be easily removed using phacoemulsification. AFTER THE  PROCEDURE  When an IOL is implanted, it does not need care. It becomes a permanent part of your eye and cannot be seen or felt.   Your doctor will schedule follow-up exams to check on your progress.   Review your other medicines with your doctor to see which can be resumed after surgery.   Use eyedrops or take medicine as prescribed by your doctor.  Document Released: 12/19/2010 Document Reviewed: 12/16/2010 [facility] Patient Information 2012 [place].  .Cataract Surgery Care After Refer to this sheet in the next few weeks. These instructions provide you with information on caring for yourself after your procedure. Your caregiver may also give you more specific instructions. Your treatment has been planned according to current medical practices, but problems sometimes occur. Call your caregiver if you have any problems or questions after your procedure.  HOME CARE INSTRUCTIONS   Avoid strenuous activities as directed by your caregiver.   Ask your caregiver when you can resume driving.   Use eyedrops or other medicines to help healing and control pressure inside your eye as directed by your caregiver.   Only take over-the-counter or prescription medicines for pain, discomfort, or fever as directed by your caregiver.   Do not to touch or rub your eyes.   You may be instructed to use a protective shield during the first few days and nights after surgery. If not, wear sunglasses to protect your eyes. This is to protect the eye from pressure or from being accidentally bumped.   Keep the area around your eye clean and dry. Avoid swimming or allowing water to hit you directly in the face while showering. Keep soap and shampoo out of your eyes.   Do not bend or lift heavy objects. Bending increases pressure in the eye. You can walk, climb stairs, and do light household chores.   Do not put a contact lens into the eye that had surgery until your caregiver says it is okay to do so.    Ask your doctor when you can return to work. This will depend on the kind of work that you do. If you work in a dusty environment, you may be advised to wear protective eyewear for a period of time.   Ask your caregiver when it will be safe to engage in sexual activity.   Continue with your regular eye exams as directed by your caregiver.  What to expect:  It is normal to feel itching and mild discomfort for a few days after cataract surgery. Some fluid discharge is also common, and your eye may be sensitive to light and touch.   After 1 to 2 days, even moderate discomfort should disappear. In most cases, healing will take about 6 weeks.   If you received an intraocular lens (IOL), you may notice that colors are very bright or have a blue tinge. Also, if you have been in  bright sunlight, everything may appear reddish for a few hours. If you see these color tinges, it is because your lens is clear and no longer cloudy. Within a few months after receiving an IOL, these extra colors should go away. When you have healed, you will probably need new glasses.  SEEK MEDICAL CARE IF:   You have increased bruising around your eye.   You have discomfort not helped by medicine.  SEEK IMMEDIATE MEDICAL CARE IF:   You have a fever.   You have a worsening or sudden vision loss.   You have redness, swelling, or increasing pain in the eye.   You have a thick discharge from the eye that had surgery.  MAKE SURE YOU:  Understand these instructions.   Will watch your condition.   Will get help right away if you are not doing well or get worse.  Document Released: 07/19/2004 Document Revised: 12/19/2010 Document Reviewed: 08/23/2010 [facility] Patient Information 2012 [place].    Monitored Anesthesia Care  Monitored anesthesia care is an anesthesia service for a medical procedure. Anesthesia is the loss of the ability to feel pain. It is produced by medications called anesthetics. It may  affect a small area of your body (local anesthesia), a large area of your body (regional anesthesia), or your entire body (general anesthesia). The need for monitored anesthesia care depends your procedure, your condition, and the potential need for regional or general anesthesia. It is often provided during procedures where:   General anesthesia may be needed if there are complications. This is because you need special care when you are under general anesthesia.   You will be under local or regional anesthesia. This is so that you are able to have higher levels of anesthesia if needed.   You will receive calming medications (sedatives). This is especially the case if sedatives are given to put you in a semi-conscious state of relaxation (deep sedation). This is because the amount of sedative needed to produce this state can be hard to predict. Too much of a sedative can produce general anesthesia. Monitored anesthesia care is performed by one or more caregivers who have special training in all types of anesthesia. You will need to meet with these caregivers before your procedure. During this meeting, they will ask you about your medical history. They will also give you instructions to follow. (For example, you will need to stop eating and drinking before your procedure. You may also need to stop or change medications you are taking.) During your procedure, your caregivers will stay with you. They will:   Watch your condition. This includes watching you blood pressure, breathing, and level of pain.   Diagnose and treat problems that occur.   Give medications if they are needed. These may include calming medications (sedatives) and anesthetics.   Make sure you are comfortable.  Having monitored anesthesia care does not necessarily mean that you will be under anesthesia. It does mean that your caregivers will be able to manage anesthesia if you need it or if it occurs. It also means that you will  be able to have a different type of anesthesia than you are having if you need it. When your procedure is complete, your caregivers will continue to watch your condition. They will make sure any medications wear off before you are allowed to go home.  Document Released: 09/25/2004 Document Revised: 04/26/2012 Document Reviewed: 02/11/2012 [facility] Patient Information 2014 [place], [place].

## 2014-08-28 ENCOUNTER — Encounter (HOSPITAL_COMMUNITY): Payer: Self-pay

## 2014-08-28 ENCOUNTER — Encounter (HOSPITAL_COMMUNITY)
Admission: RE | Admit: 2014-08-28 | Discharge: 2014-08-28 | Disposition: A | Discharge status: Home / Self Care | Payer: Medicare Other | Source: Ambulatory Visit | Attending: Ophthalmology | Admitting: Ophthalmology

## 2014-08-28 ENCOUNTER — Other Ambulatory Visit: Payer: Self-pay

## 2014-08-28 DIAGNOSIS — H2512 Age-related nuclear cataract, left eye: Secondary | ICD-10-CM | POA: Insufficient documentation

## 2014-08-28 DIAGNOSIS — Z01818 Encounter for other preprocedural examination: Secondary | ICD-10-CM | POA: Diagnosis not present

## 2014-08-28 HISTORY — DX: Personal history of other diseases of the musculoskeletal system and connective tissue: Z87.39

## 2014-08-28 HISTORY — DX: Essential (primary) hypertension: I10

## 2014-08-28 HISTORY — DX: Pure hypercholesterolemia, unspecified: E78.00

## 2014-08-28 LAB — BASIC METABOLIC PANEL
Anion gap: 8 (ref 5–15)
BUN: 11 mg/dL (ref 6–20)
CALCIUM: 9.2 mg/dL (ref 8.9–10.3)
CO2: 24 mmol/L (ref 22–32)
CREATININE: 0.9 mg/dL (ref 0.44–1.00)
Chloride: 108 mmol/L (ref 101–111)
GFR calc Af Amer: >60 mL/min (ref >60)
GFR, EST NON AFRICAN AMERICAN: 59 mL/min — AB (ref >60)
GLUCOSE: 96 mg/dL (ref 65–99)
Potassium: 3.9 mmol/L (ref 3.5–5.1)
Sodium: 140 mmol/L (ref 135–145)

## 2014-08-28 LAB — CBC
HEMATOCRIT: 41 % (ref 36.0–46.0)
Hemoglobin: 13.3 g/dL (ref 12.0–15.0)
MCH: 30.6 pg (ref 26.0–34.0)
MCHC: 32.4 g/dL (ref 30.0–36.0)
MCV: 94.5 fL (ref 78.0–100.0)
PLATELETS: 247 10*3/uL (ref 150–400)
RBC: 4.34 MIL/uL (ref 3.87–5.11)
RDW: 13.4 % (ref 11.5–15.5)
WBC: 7.9 10*3/uL (ref 4.0–10.5)

## 2014-08-28 NOTE — Pre-Procedure Instructions (Signed)
Patient given information to sign up for my chart at home. 

## 2014-09-01 MED ORDER — PHENYLEPHRINE HCL 2.5 % OP SOLN
OPHTHALMIC | Status: AC
  Filled 2014-09-01: qty 15

## 2014-09-01 MED ORDER — CYCLOPENTOLATE-PHENYLEPHRINE OP SOLN OPTIME - NO CHARGE
OPHTHALMIC | Status: AC
  Filled 2014-09-01: qty 2

## 2014-09-01 MED ORDER — NEOMYCIN-POLYMYXIN-DEXAMETH 3.5-10000-0.1 OP SUSP
OPHTHALMIC | Status: AC
  Filled 2014-09-01: qty 5

## 2014-09-01 MED ORDER — LIDOCAINE HCL 3.5 % OP GEL
OPHTHALMIC | Status: AC
  Filled 2014-09-01: qty 1

## 2014-09-01 MED ORDER — LIDOCAINE HCL (PF) 1 % IJ SOLN
INTRAMUSCULAR | Status: AC
  Filled 2014-09-01: qty 2

## 2014-09-01 MED ORDER — TETRACAINE HCL 0.5 % OP SOLN
OPHTHALMIC | Status: AC
  Filled 2014-09-01: qty 2

## 2014-09-04 ENCOUNTER — Ambulatory Visit (HOSPITAL_COMMUNITY)
Admission: RE | Admit: 2014-09-04 | Discharge: 2014-09-04 | Disposition: A | Discharge status: Home / Self Care | Payer: Medicare Other | Source: Ambulatory Visit | Attending: Ophthalmology | Admitting: Ophthalmology

## 2014-09-04 ENCOUNTER — Encounter (HOSPITAL_COMMUNITY)
Admission: RE | Disposition: A | Discharge status: Home / Self Care | Payer: Self-pay | Source: Ambulatory Visit | Attending: Ophthalmology

## 2014-09-04 ENCOUNTER — Ambulatory Visit (HOSPITAL_COMMUNITY): Payer: Medicare Other | Admitting: Anesthesiology

## 2014-09-04 ENCOUNTER — Encounter (HOSPITAL_COMMUNITY): Payer: Self-pay | Admitting: Anesthesiology

## 2014-09-04 DIAGNOSIS — F17200 Nicotine dependence, unspecified, uncomplicated: Secondary | ICD-10-CM | POA: Insufficient documentation

## 2014-09-04 DIAGNOSIS — K219 Gastro-esophageal reflux disease without esophagitis: Secondary | ICD-10-CM | POA: Diagnosis not present

## 2014-09-04 DIAGNOSIS — Z7951 Long term (current) use of inhaled steroids: Secondary | ICD-10-CM | POA: Insufficient documentation

## 2014-09-04 DIAGNOSIS — Z853 Personal history of malignant neoplasm of breast: Secondary | ICD-10-CM | POA: Insufficient documentation

## 2014-09-04 DIAGNOSIS — I10 Essential (primary) hypertension: Secondary | ICD-10-CM | POA: Insufficient documentation

## 2014-09-04 DIAGNOSIS — H25812 Combined forms of age-related cataract, left eye: Secondary | ICD-10-CM | POA: Diagnosis not present

## 2014-09-04 DIAGNOSIS — I2510 Atherosclerotic heart disease of native coronary artery without angina pectoris: Secondary | ICD-10-CM | POA: Diagnosis not present

## 2014-09-04 DIAGNOSIS — Z79899 Other long term (current) drug therapy: Secondary | ICD-10-CM | POA: Insufficient documentation

## 2014-09-04 DIAGNOSIS — Z955 Presence of coronary angioplasty implant and graft: Secondary | ICD-10-CM | POA: Insufficient documentation

## 2014-09-04 DIAGNOSIS — H269 Unspecified cataract: Secondary | ICD-10-CM | POA: Diagnosis not present

## 2014-09-04 DIAGNOSIS — Z7982 Long term (current) use of aspirin: Secondary | ICD-10-CM | POA: Diagnosis not present

## 2014-09-04 HISTORY — PX: CATARACT EXTRACTION W/PHACO: SHX586

## 2014-09-04 SURGERY — PHACOEMULSIFICATION, CATARACT, WITH IOL INSERTION
Anesthesia: Monitor Anesthesia Care | Site: Eye | Laterality: Left

## 2014-09-04 MED ORDER — LIDOCAINE 3.5 % OP GEL OPTIME - NO CHARGE
OPHTHALMIC | Status: DC | PRN
Start: 2014-09-04 — End: 2014-09-04
  Administered 2014-09-04: 1 [drp] via OPHTHALMIC

## 2014-09-04 MED ORDER — POVIDONE-IODINE 5 % OP SOLN
OPHTHALMIC | Status: DC | PRN
  Administered 2014-09-04: 1 via OPHTHALMIC

## 2014-09-04 MED ORDER — CYCLOPENTOLATE-PHENYLEPHRINE 0.2-1 % OP SOLN
1.0000 [drp] | OPHTHALMIC | Status: AC
  Administered 2014-09-04 (×3): 1 [drp] via OPHTHALMIC

## 2014-09-04 MED ORDER — MIDAZOLAM HCL 2 MG/2ML IJ SOLN
1.0000 mg | INTRAMUSCULAR | Status: DC | PRN
  Administered 2014-09-04 (×2): 1 mg via INTRAVENOUS
  Filled 2014-09-04: qty 2

## 2014-09-04 MED ORDER — LACTATED RINGERS IV SOLN
INTRAVENOUS | Status: DC
  Administered 2014-09-04: 11:00:00 via INTRAVENOUS

## 2014-09-04 MED ORDER — TETRACAINE HCL 0.5 % OP SOLN
1.0000 [drp] | OPHTHALMIC | Status: AC
  Administered 2014-09-04 (×3): 1 [drp] via OPHTHALMIC

## 2014-09-04 MED ORDER — FENTANYL CITRATE (PF) 100 MCG/2ML IJ SOLN
25.0000 ug | INTRAMUSCULAR | Status: AC
  Administered 2014-09-04 (×2): 25 ug via INTRAVENOUS
  Filled 2014-09-04: qty 2

## 2014-09-04 MED ORDER — LIDOCAINE HCL 3.5 % OP GEL
1.0000 "application " | Freq: Once | OPHTHALMIC | Status: AC
  Administered 2014-09-04: 1 via OPHTHALMIC

## 2014-09-04 MED ORDER — BSS IO SOLN
INTRAOCULAR | Status: DC | PRN
Start: 2014-09-04 — End: 2014-09-04
  Administered 2014-09-04: 500 mL

## 2014-09-04 MED ORDER — PHENYLEPHRINE HCL 2.5 % OP SOLN
1.0000 [drp] | OPHTHALMIC | Status: AC
  Administered 2014-09-04 (×3): 1 [drp] via OPHTHALMIC

## 2014-09-04 MED ORDER — PROVISC 10 MG/ML IO SOLN
INTRAOCULAR | Status: DC | PRN
Start: 2014-09-04 — End: 2014-09-04
  Administered 2014-09-04: 0.85 mL via INTRAOCULAR

## 2014-09-04 MED ORDER — NEOMYCIN-POLYMYXIN-DEXAMETH 3.5-10000-0.1 OP SUSP
OPHTHALMIC | Status: DC | PRN
Start: 2014-09-04 — End: 2014-09-04
  Administered 2014-09-04: 2 [drp] via OPHTHALMIC

## 2014-09-04 MED ORDER — BSS IO SOLN
INTRAOCULAR | Status: DC | PRN
Start: 2014-09-04 — End: 2014-09-04
  Administered 2014-09-04: 15 mL

## 2014-09-04 MED ORDER — LIDOCAINE HCL (PF) 1 % IJ SOLN
INTRAMUSCULAR | Status: DC | PRN
  Administered 2014-09-04: .5 mL

## 2014-09-04 MED ORDER — EPINEPHRINE HCL 1 MG/ML IJ SOLN
INTRAMUSCULAR | Status: AC
  Filled 2014-09-04: qty 1

## 2014-09-04 SURGICAL SUPPLY — 11 items
CLOTH BEACON ORANGE TIMEOUT ST (SAFETY) ×2 IMPLANT
EYE SHIELD UNIVERSAL CLEAR (GAUZE/BANDAGES/DRESSINGS) ×2 IMPLANT
GLOVE BIOGEL PI IND STRL 7.0 (GLOVE) IMPLANT
GLOVE BIOGEL PI INDICATOR 7.0 (GLOVE) ×2
GLOVE EXAM NITRILE MD LF STRL (GLOVE) ×2 IMPLANT
PAD ARMBOARD 7.5X6 YLW CONV (MISCELLANEOUS) ×2 IMPLANT
SIGHTPATH CAT PROC W REG LENS (Ophthalmic Related) ×3 IMPLANT
SYRINGE LUER LOK 1CC (MISCELLANEOUS) ×2 IMPLANT
TAPE SURG TRANSPORE 1 IN (GAUZE/BANDAGES/DRESSINGS) IMPLANT
TAPE SURGICAL TRANSPORE 1 IN (GAUZE/BANDAGES/DRESSINGS) ×2
WATER STERILE IRR 250ML POUR (IV SOLUTION) ×2 IMPLANT

## 2014-09-04 NOTE — Anesthesia Preprocedure Evaluation (Signed)
Anesthesia Evaluation  Patient identified by MRN, date of birth, ID band  Reviewed: Allergy & Precautions, NPO status , Patient's Chart, lab work & pertinent test results  Airway Mallampati: I  TM Distance: >3 FB     Dental  (+) Edentulous Upper, Partial Lower   Pulmonary Current Smoker,  breath sounds clear to auscultation        Cardiovascular hypertension, Pt. on medications + CAD, + Past MI and + Cardiac Stents Rhythm:Regular Rate:Normal     Neuro/Psych    GI/Hepatic GERD-  Medicated,  Endo/Other    Renal/GU      Musculoskeletal   Abdominal   Peds  Hematology   Anesthesia Other Findings   Reproductive/Obstetrics                             Anesthesia Physical Anesthesia Plan  ASA: III  Anesthesia Plan: MAC   Post-op Pain Management:    Induction: Intravenous  Airway Management Planned: Nasal Cannula  Additional Equipment:   Intra-op Plan:   Post-operative Plan:   Informed Consent: I have reviewed the patients History and Physical, chart, labs and discussed the procedure including the risks, benefits and alternatives for the proposed anesthesia with the patient or authorized representative who has indicated his/her understanding and acceptance.     Plan Discussed with:   Anesthesia Plan Comments:         Anesthesia Quick Evaluation

## 2014-09-04 NOTE — H&P (Signed)
I have reviewed the H&P, the patient was re-examined, and I have identified no interval changes in medical condition and plan of care since the history and physical of record  

## 2014-09-04 NOTE — Transfer of Care (Signed)
Immediate Anesthesia Transfer of Care Note  Patient: Margaret Thompson  Procedure(s) Performed: Procedure(s) with comments: CATARACT EXTRACTION PHACO AND INTRAOCULAR LENS PLACEMENT (IOC) (Left) - CDE:9.84  Patient Location: Short Stay  Anesthesia Type:MAC  Level of Consciousness: awake, alert  and oriented  Airway & Oxygen Therapy: Patient Spontanous Breathing  Post-op Assessment: Report given to RN  Post vital signs: Reviewed  Last Vitals:  Filed Vitals:   09/04/14 1203  BP: 125/70  Resp: 15    Complications: No apparent anesthesia complications

## 2014-09-04 NOTE — Discharge Instructions (Signed)

## 2014-09-04 NOTE — Anesthesia Postprocedure Evaluation (Signed)
  Anesthesia Post-op Note  Patient: Margaret Thompson  Procedure(s) Performed: Procedure(s) with comments: CATARACT EXTRACTION PHACO AND INTRAOCULAR LENS PLACEMENT (IOC) (Left) - CDE:9.84  Patient Location: Short Stay  Anesthesia Type:MAC  Level of Consciousness: awake, alert  and oriented  Airway and Oxygen Therapy: Patient Spontanous Breathing  Post-op Pain: none  Post-op Assessment: Post-op Vital signs reviewed, Patient's Cardiovascular Status Stable, Respiratory Function Stable and Patent Airway              Post-op Vital Signs: Reviewed and stable  Last Vitals:  Filed Vitals:   09/04/14 1203  BP: 125/70  Resp: 15    Complications: No apparent anesthesia complications

## 2014-09-04 NOTE — Op Note (Signed)
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  Assistants: None  Anesthesia: Topical with MAC  Indications: Painless, progressive loss of vision with compromise of daily activities.  Surgery: Cataract Extraction with Intraocular lens Implant Left Eye  Discription: The patient had dilating drops and viscous lidocaine placed into the Left eye in the pre-op holding area. After transfer to the operating room, a time out was performed. The patient was then prepped and draped. Beginning with a 53 degree blade a paracentesis port was made at the surgeon's 2 o'clock position. The anterior chamber was then filled with 1% non-preserved lidocaine. This was followed by filling the anterior chamber with Provisc.  A 2.46m keratome blade was used to make a clear corneal incision at the temporal limbus.  A bent cystatome needle was used to create a continuous tear capsulotomy. Hydrodissection was performed with balanced salt solution on a Fine canula. The lens nucleus was then removed using the phacoemulsification handpiece. Residual cortex was removed with the I&A handpiece. The anterior chamber and capsular bag were refilled with Provisc. A posterior chamber intraocular lens was placed into the capsular bag with it's injector. The implant was positioned with the Kuglan hook. The Provisc was then removed from the anterior chamber and capsular bag with the I&A handpiece. Stromal hydration of the main incision and paracentesis port was performed with BSS on a Fine canula. The wounds were tested for leak which was negative. The patient tolerated the procedure well. There were no operative complications. The patient was then transferred to the recovery room in stable condition.  Complications: None  Specimen: None  EBL: None  Prosthetic device: Hoya iSert 250, power 18.0 D, SN  [id number]

## 2014-09-05 ENCOUNTER — Encounter (HOSPITAL_COMMUNITY): Payer: Self-pay | Admitting: Ophthalmology

## 2014-09-06 DIAGNOSIS — Z716 Tobacco abuse counseling: Secondary | ICD-10-CM | POA: Diagnosis not present

## 2014-09-06 DIAGNOSIS — Z720 Tobacco use: Secondary | ICD-10-CM | POA: Diagnosis not present

## 2014-09-06 DIAGNOSIS — I10 Essential (primary) hypertension: Secondary | ICD-10-CM | POA: Diagnosis not present

## 2014-09-06 DIAGNOSIS — F17290 Nicotine dependence, other tobacco product, uncomplicated: Secondary | ICD-10-CM | POA: Diagnosis not present

## 2014-09-06 DIAGNOSIS — I2510 Atherosclerotic heart disease of native coronary artery without angina pectoris: Secondary | ICD-10-CM | POA: Diagnosis not present

## 2014-09-11 DIAGNOSIS — H25811 Combined forms of age-related cataract, right eye: Secondary | ICD-10-CM | POA: Diagnosis not present

## 2014-09-11 DIAGNOSIS — Z961 Presence of intraocular lens: Secondary | ICD-10-CM | POA: Diagnosis not present

## 2014-11-06 ENCOUNTER — Encounter (HOSPITAL_COMMUNITY): Payer: Self-pay

## 2014-11-06 ENCOUNTER — Encounter (HOSPITAL_COMMUNITY)
Admission: RE | Admit: 2014-11-06 | Discharge: 2014-11-06 | Disposition: A | Discharge status: Home / Self Care | Payer: Medicare Other | Source: Ambulatory Visit | Attending: Ophthalmology | Admitting: Ophthalmology

## 2014-11-08 MED ORDER — PHENYLEPHRINE HCL 2.5 % OP SOLN
OPHTHALMIC | Status: AC
  Filled 2014-11-08: qty 15

## 2014-11-08 MED ORDER — LIDOCAINE HCL 3.5 % OP GEL
OPHTHALMIC | Status: AC
  Filled 2014-11-08: qty 1

## 2014-11-08 MED ORDER — LIDOCAINE HCL (PF) 1 % IJ SOLN
INTRAMUSCULAR | Status: AC
  Filled 2014-11-08: qty 2

## 2014-11-08 MED ORDER — NEOMYCIN-POLYMYXIN-DEXAMETH 3.5-10000-0.1 OP SUSP
OPHTHALMIC | Status: AC
  Filled 2014-11-08: qty 5

## 2014-11-08 MED ORDER — TETRACAINE HCL 0.5 % OP SOLN
OPHTHALMIC | Status: AC
  Filled 2014-11-08: qty 2

## 2014-11-08 MED ORDER — CYCLOPENTOLATE-PHENYLEPHRINE OP SOLN OPTIME - NO CHARGE
OPHTHALMIC | Status: AC
  Filled 2014-11-08: qty 2

## 2014-11-09 ENCOUNTER — Ambulatory Visit (HOSPITAL_COMMUNITY): Payer: Medicare Other | Admitting: Anesthesiology

## 2014-11-09 ENCOUNTER — Encounter (HOSPITAL_COMMUNITY): Payer: Self-pay | Admitting: *Deleted

## 2014-11-09 ENCOUNTER — Ambulatory Visit (HOSPITAL_COMMUNITY)
Admission: RE | Admit: 2014-11-09 | Discharge: 2014-11-09 | Disposition: A | Discharge status: Home / Self Care | Payer: Medicare Other | Source: Ambulatory Visit | Attending: Ophthalmology | Admitting: Ophthalmology

## 2014-11-09 ENCOUNTER — Encounter (HOSPITAL_COMMUNITY)
Admission: RE | Disposition: A | Discharge status: Home / Self Care | Payer: Self-pay | Source: Ambulatory Visit | Attending: Ophthalmology

## 2014-11-09 DIAGNOSIS — Z79899 Other long term (current) drug therapy: Secondary | ICD-10-CM | POA: Insufficient documentation

## 2014-11-09 DIAGNOSIS — Z955 Presence of coronary angioplasty implant and graft: Secondary | ICD-10-CM | POA: Diagnosis not present

## 2014-11-09 DIAGNOSIS — F17200 Nicotine dependence, unspecified, uncomplicated: Secondary | ICD-10-CM | POA: Diagnosis not present

## 2014-11-09 DIAGNOSIS — Z853 Personal history of malignant neoplasm of breast: Secondary | ICD-10-CM | POA: Insufficient documentation

## 2014-11-09 DIAGNOSIS — I252 Old myocardial infarction: Secondary | ICD-10-CM | POA: Diagnosis not present

## 2014-11-09 DIAGNOSIS — K219 Gastro-esophageal reflux disease without esophagitis: Secondary | ICD-10-CM | POA: Insufficient documentation

## 2014-11-09 DIAGNOSIS — Z7982 Long term (current) use of aspirin: Secondary | ICD-10-CM | POA: Diagnosis not present

## 2014-11-09 DIAGNOSIS — H2512 Age-related nuclear cataract, left eye: Secondary | ICD-10-CM | POA: Diagnosis not present

## 2014-11-09 DIAGNOSIS — H25811 Combined forms of age-related cataract, right eye: Secondary | ICD-10-CM | POA: Insufficient documentation

## 2014-11-09 DIAGNOSIS — I10 Essential (primary) hypertension: Secondary | ICD-10-CM | POA: Insufficient documentation

## 2014-11-09 DIAGNOSIS — H269 Unspecified cataract: Secondary | ICD-10-CM | POA: Diagnosis not present

## 2014-11-09 HISTORY — PX: CATARACT EXTRACTION W/PHACO: SHX586

## 2014-11-09 SURGERY — PHACOEMULSIFICATION, CATARACT, WITH IOL INSERTION
Anesthesia: Monitor Anesthesia Care | Site: Eye | Laterality: Right

## 2014-11-09 MED ORDER — MIDAZOLAM HCL 2 MG/2ML IJ SOLN
1.0000 mg | INTRAMUSCULAR | Status: DC | PRN
  Administered 2014-11-09: 2 mg via INTRAVENOUS

## 2014-11-09 MED ORDER — TETRACAINE HCL 0.5 % OP SOLN
1.0000 [drp] | OPHTHALMIC | Status: AC
  Administered 2014-11-09 (×3): 1 [drp] via OPHTHALMIC

## 2014-11-09 MED ORDER — MIDAZOLAM HCL 2 MG/2ML IJ SOLN
INTRAMUSCULAR | Status: AC
  Filled 2014-11-09: qty 2

## 2014-11-09 MED ORDER — ONDANSETRON HCL 4 MG/2ML IJ SOLN
INTRAMUSCULAR | Status: AC
  Filled 2014-11-09: qty 4

## 2014-11-09 MED ORDER — EPINEPHRINE HCL 1 MG/ML IJ SOLN
INTRAMUSCULAR | Status: AC
  Filled 2014-11-09: qty 1

## 2014-11-09 MED ORDER — ONDANSETRON HCL 4 MG/2ML IJ SOLN
INTRAMUSCULAR | Status: DC | PRN
  Administered 2014-11-09: 4 mg via INTRAVENOUS

## 2014-11-09 MED ORDER — NEOMYCIN-POLYMYXIN-DEXAMETH 3.5-10000-0.1 OP SUSP
OPHTHALMIC | Status: AC
  Filled 2014-11-09: qty 5

## 2014-11-09 MED ORDER — FENTANYL CITRATE (PF) 100 MCG/2ML IJ SOLN
INTRAMUSCULAR | Status: AC
  Filled 2014-11-09: qty 2

## 2014-11-09 MED ORDER — PHENYLEPHRINE HCL 2.5 % OP SOLN
1.0000 [drp] | OPHTHALMIC | Status: AC
  Administered 2014-11-09 (×3): 1 [drp] via OPHTHALMIC

## 2014-11-09 MED ORDER — NEOMYCIN-POLYMYXIN-DEXAMETH 3.5-10000-0.1 OP SUSP
OPHTHALMIC | Status: DC | PRN
  Administered 2014-11-09: 2 [drp] via OPHTHALMIC

## 2014-11-09 MED ORDER — POVIDONE-IODINE 5 % OP SOLN
OPHTHALMIC | Status: DC | PRN
  Administered 2014-11-09: 1 via OPHTHALMIC

## 2014-11-09 MED ORDER — FENTANYL CITRATE (PF) 100 MCG/2ML IJ SOLN
25.0000 ug | INTRAMUSCULAR | Status: AC
  Administered 2014-11-09: 25 ug via INTRAVENOUS

## 2014-11-09 MED ORDER — LACTATED RINGERS IV SOLN
INTRAVENOUS | Status: DC
  Administered 2014-11-09: 1000 mL via INTRAVENOUS

## 2014-11-09 MED ORDER — BSS IO SOLN
INTRAOCULAR | Status: DC | PRN
  Administered 2014-11-09: 15 mL

## 2014-11-09 MED ORDER — LIDOCAINE 3.5 % OP GEL OPTIME - NO CHARGE
OPHTHALMIC | Status: DC | PRN
  Administered 2014-11-09: 1 [drp] via OPHTHALMIC

## 2014-11-09 MED ORDER — LIDOCAINE HCL 3.5 % OP GEL
1.0000 "application " | Freq: Once | OPHTHALMIC | Status: AC
  Administered 2014-11-09: 1 via OPHTHALMIC

## 2014-11-09 MED ORDER — EPINEPHRINE HCL 1 MG/ML IJ SOLN
INTRAOCULAR | Status: DC | PRN
  Administered 2014-11-09: 500 mL

## 2014-11-09 MED ORDER — PROVISC 10 MG/ML IO SOLN
INTRAOCULAR | Status: DC | PRN
  Administered 2014-11-09: 0.85 mL via INTRAOCULAR

## 2014-11-09 MED ORDER — CYCLOPENTOLATE-PHENYLEPHRINE 0.2-1 % OP SOLN
1.0000 [drp] | OPHTHALMIC | Status: AC
  Administered 2014-11-09 (×3): 1 [drp] via OPHTHALMIC

## 2014-11-09 MED ORDER — LIDOCAINE HCL (PF) 1 % IJ SOLN
INTRAMUSCULAR | Status: DC | PRN
Start: 2014-11-09 — End: 2014-11-09
  Administered 2014-11-09: .4 mL

## 2014-11-09 SURGICAL SUPPLY — 10 items
CLOTH BEACON ORANGE TIMEOUT ST (SAFETY) ×2 IMPLANT
EYE SHIELD UNIVERSAL CLEAR (GAUZE/BANDAGES/DRESSINGS) ×2 IMPLANT
GLOVE BIOGEL PI IND STRL 6.5 (GLOVE) IMPLANT
GLOVE BIOGEL PI INDICATOR 6.5 (GLOVE) ×4
PAD ARMBOARD 7.5X6 YLW CONV (MISCELLANEOUS) ×2 IMPLANT
SIGHTPATH CAT PROC W REG LENS (Ophthalmic Related) ×3 IMPLANT
SYRINGE LUER LOK 1CC (MISCELLANEOUS) ×2 IMPLANT
TAPE SURG TRANSPORE 1 IN (GAUZE/BANDAGES/DRESSINGS) IMPLANT
TAPE SURGICAL TRANSPORE 1 IN (GAUZE/BANDAGES/DRESSINGS) ×2
WATER STERILE IRR 250ML POUR (IV SOLUTION) ×2 IMPLANT

## 2014-11-09 NOTE — Anesthesia Postprocedure Evaluation (Signed)
  Anesthesia Post-op Note  Patient: Margaret Thompson  Procedure(s) Performed: Procedure(s) (LRB): CATARACT EXTRACTION PHACO AND INTRAOCULAR LENS PLACEMENT (IOC) (Right)  Patient Location:  Short Stay  Anesthesia Type: MAC  Level of Consciousness: awake  Airway and Oxygen Therapy: Patient Spontanous Breathing  Post-op Pain: none  Post-op Assessment: Post-op Vital signs reviewed, Patient's Cardiovascular Status Stable, Respiratory Function Stable, Patent Airway, No signs of Nausea or vomiting and Pain level controlled  Post-op Vital Signs: Reviewed and stable  Complications: No apparent anesthesia complications

## 2014-11-09 NOTE — Anesthesia Preprocedure Evaluation (Signed)
Anesthesia Evaluation  Patient identified by MRN, date of birth, ID band Patient awake    Reviewed: Allergy & Precautions, NPO status , Patient's Chart, lab work & pertinent test results  Airway Mallampati: I  TM Distance: >3 FB     Dental  (+) Edentulous Upper, Partial Lower   Pulmonary Current Smoker,    breath sounds clear to auscultation       Cardiovascular hypertension, Pt. on medications + CAD, + Past MI and + Cardiac Stents   Rhythm:Regular Rate:Normal     Neuro/Psych    GI/Hepatic GERD  Medicated,  Endo/Other    Renal/GU      Musculoskeletal   Abdominal   Peds  Hematology   Anesthesia Other Findings   Reproductive/Obstetrics                             Anesthesia Physical Anesthesia Plan  ASA: III  Anesthesia Plan: MAC   Post-op Pain Management:    Induction: Intravenous  Airway Management Planned: Nasal Cannula  Additional Equipment:   Intra-op Plan:   Post-operative Plan:   Informed Consent: I have reviewed the patients History and Physical, chart, labs and discussed the procedure including the risks, benefits and alternatives for the proposed anesthesia with the patient or authorized representative who has indicated his/her understanding and acceptance.     Plan Discussed with:   Anesthesia Plan Comments:         Anesthesia Quick Evaluation

## 2014-11-09 NOTE — Transfer of Care (Signed)
Immediate Anesthesia Transfer of Care Note  Patient: Margaret Thompson  Procedure(s) Performed: Procedure(s) (LRB): CATARACT EXTRACTION PHACO AND INTRAOCULAR LENS PLACEMENT (IOC) (Right)  Patient Location: Shortstay  Anesthesia Type: MAC  Level of Consciousness: awake  Airway & Oxygen Therapy: Patient Spontanous Breathing   Post-op Assessment: Report given to PACU RN, Post -op Vital signs reviewed and stable and Patient moving all extremities  Post vital signs: Reviewed and stable  Complications: No apparent anesthesia complications

## 2014-11-09 NOTE — Discharge Instructions (Signed)
Anesthesia, Adult, Care After °Refer to this sheet in the next few weeks. These instructions provide you with information on caring for yourself after your procedure. Your health care provider may also give you more specific instructions. Your treatment has been planned according to current medical practices, but problems sometimes occur. Call your health care provider if you have any problems or questions after your procedure. °WHAT TO EXPECT AFTER THE PROCEDURE °After the procedure, it is typical to experience: °· Sleepiness. °· Nausea and vomiting. °HOME CARE INSTRUCTIONS °· For the first 24 hours after general anesthesia: °¨ Have a responsible person with you. °¨ Do not drive a car. If you are alone, do not take public transportation. °¨ Do not drink alcohol. °¨ Do not take medicine that has not been prescribed by your health care provider. °¨ Do not sign important papers or make important decisions. °¨ You may resume a normal diet and activities as directed by your health care provider. °· Change bandages (dressings) as directed. °· If you have questions or problems that seem related to general anesthesia, call the hospital and ask for the anesthetist or anesthesiologist on call. °SEEK MEDICAL CARE IF: °· You have nausea and vomiting that continue the day after anesthesia. °· You develop a rash. °SEEK IMMEDIATE MEDICAL CARE IF:  °· You have difficulty breathing. °· You have chest pain. °· You have any allergic problems. °  °This information is not intended to replace advice given to you by your health care provider. Make sure you discuss any questions you have with your health care provider. °  °Document Released: 04/07/2000 Document Revised: 01/20/2014 Document Reviewed: 04/30/2011 °Elsevier Interactive Patient Education ©2016 [employer] ° °

## 2014-11-09 NOTE — H&P (Signed)
I have reviewed the H&P, the patient was re-examined, and I have identified no interval changes in medical condition and plan of care since the history and physical of record  

## 2014-11-09 NOTE — Op Note (Signed)
Date of Admission: 11/09/2014  Date of Surgery: 11/09/2014   Pre-Op Dx: Cataract Right Eye  Post-Op Dx: Senile Combined Cataract Right  Eye,  Dx Code Z96.728  Surgeon: Tonny Branch, M.D.  Assistants: None  Anesthesia: Topical with MAC  Indications: Painless, progressive loss of vision with compromise of daily activities.  Surgery: Cataract Extraction with Intraocular lens Implant Right Eye  Discription: The patient had dilating drops and viscous lidocaine placed into the Right eye in the pre-op holding area. After transfer to the operating room, a time out was performed. The patient was then prepped and draped. Beginning with a 20 degree blade a paracentesis port was made at the surgeon's 2 o'clock position. The anterior chamber was then filled with 1% non-preserved lidocaine. This was followed by filling the anterior chamber with Provisc.  A 2.35m keratome blade was used to make a clear corneal incision at the temporal limbus.  A bent cystatome needle was used to create a continuous tear capsulotomy. Hydrodissection was performed with balanced salt solution on a Fine canula. The lens nucleus was then removed using the phacoemulsification handpiece. Residual cortex was removed with the I&A handpiece. The anterior chamber and capsular bag were refilled with Provisc. A posterior chamber intraocular lens was placed into the capsular bag with it's injector. The implant was positioned with the Kuglan hook. The Provisc was then removed from the anterior chamber and capsular bag with the I&A handpiece. Stromal hydration of the main incision and paracentesis port was performed with BSS on a Fine canula. The wounds were tested for leak which was negative. The patient tolerated the procedure well. There were no operative complications. The patient was then transferred to the recovery room in stable condition.  Complications: None  Specimen: None  EBL: None  Prosthetic device: Hoya iSert 250, power 18.5  D, SN [id number].

## 2014-11-09 NOTE — Anesthesia Procedure Notes (Signed)
Procedure Name: MAC Date/Time: 11/09/2014 7:20 AM Performed by: Vista Deck Pre-anesthesia Checklist: Patient identified, Emergency Drugs available, Suction available, Timeout performed and Patient being monitored Patient Re-evaluated:Patient Re-evaluated prior to inductionOxygen Delivery Method: Nasal Cannula

## 2014-11-10 ENCOUNTER — Encounter (HOSPITAL_COMMUNITY): Payer: Self-pay | Admitting: Ophthalmology

## 2014-11-24 ENCOUNTER — Ambulatory Visit (HOSPITAL_COMMUNITY): Payer: Medicare Other | Admitting: Oncology

## 2014-11-24 ENCOUNTER — Encounter (HOSPITAL_COMMUNITY): Payer: Medicare Other | Attending: Oncology | Admitting: Oncology

## 2014-11-24 VITALS — BP 142/72 | HR 112 | Temp 97.9°F | Resp 16 | Wt 121.5 lb

## 2014-11-24 DIAGNOSIS — Z853 Personal history of malignant neoplasm of breast: Secondary | ICD-10-CM | POA: Diagnosis not present

## 2014-11-24 DIAGNOSIS — C50912 Malignant neoplasm of unspecified site of left female breast: Secondary | ICD-10-CM

## 2014-11-24 NOTE — Patient Instructions (Signed)
[place] at [facility] Discharge Instructions  RECOMMENDATIONS MADE BY THE CONSULTANT AND ANY TEST RESULTS WILL BE SENT TO YOUR REFERRING PHYSICIAN.  Exam and discussion by Robynn Pane, PA-C. Report any new lumps, bone pain, shortness of breath or other symptoms. Mammogram due in May  Follow-up:  Office visit in 1 year.  Thank you for choosing [place] at [facility] to provide your oncology and hematology care.  To afford each patient quality time with our provider, please arrive at least 15 minutes before your scheduled appointment time.    You need to re-schedule your appointment should you arrive 10 or more minutes late.  We strive to give you quality time with our providers, and arriving late affects you and other patients whose appointments are after yours.  Also, if you no show three or more times for appointments you may be dismissed from the clinic at the providers discretion.     Again, thank you for choosing [facility].  Our hope is that these requests will decrease the amount of time that you wait before being seen by our physicians.       _____________________________________________________________  Should you have questions after your visit to [facility], please contact our office at [phone] between the hours of 8:30 a.m. and 4:30 p.m.  Voicemails left after 4:30 p.m. will not be returned until the following business day.  For prescription refill requests, have your pharmacy contact our office.

## 2014-11-24 NOTE — Progress Notes (Signed)
[name]., MD [place] [address]  Adenocarcinoma of left breast [facility])  CURRENT THERAPY: Surveillance per NCCN guidelines.   INTERVAL HISTORY: Margaret Thompson 81 y.o. female returns for  regular  visit for followup of Stage II (T1 C. N1 M0) adenocarcinoma of the left breast with surgery on 03/08/2001. She had a 1.5 cm cancer with 1/6 positive sentinel nodes. Reexcision margins were clear. She did participate in NSABP B.-30 randomized to Adriamycin and Taxotere for 4 cycles followed by radiation therapy from 07/13/2001 to 08/27/2001. She then started Arimidex on 07/07/2001 took that until the end of June 2008. Thus far she has no evidence for recurrence.  I personally reviewed and went over laboratory results with the patient.  The results are noted within this dictation.  Labs are noted in [facility] from August 2016.  No role for oncology labs today.  I personally reviewed and went over radiographic studies with the patient.  The results are noted within this dictation.  Mammogram in May 2016 was BIRADS 1.  She is S/P cataract removal and tooth extraction.   Past Medical History  Diagnosis Date  . Osteoporosis   . Bilateral cataracts   . GERD (gastroesophageal reflux disease)   . Breast cancer ([place])   . Adenocarcinoma of left breast ([place]) 11/23/2012    Stage II (T1 C. N1 M0) adenocarcinoma of the left breast with surgery on 03/08/2001. She had a 1.5 cm cancer with 1/6 positive sentinel nodes. Reexcision margins were clear. She did participate in NSABP B.-30 randomized to Adriamycin and Taxotere for 4 cycles followed by radiation therapy from 07/13/2001 to 08/27/2001. She then started Arimidex on 07/07/2001 took that until the end of June 2008. T  . Acute MI ([place]) 2003  . Hypertension   . Hypercholesteremia   . History of gout     has Bursitis, shoulder; Adenocarcinoma of left breast ([place]); and Acute MI, inferoposterior wall ([place]) on her problem list.     is allergic to azithromycin.  Margaret Thompson does not currently have medications on file.  Past Surgical History  Procedure Laterality Date  . Breast surgery    . Abdominal hysterectomy    . Portacath placement    . Port-a-cath removal    . Percutaneous stent intervention      RCA  . Left heart catheterization with coronary angiogram N/A 12/21/2012    Procedure: LEFT HEART CATHETERIZATION WITH CORONARY ANGIOGRAM;  Surgeon: Clent Demark, MD;  Location: [facility] CATH LAB;  Service: Cardiovascular;  Laterality: N/A;  . Mastectomy, partial Left   . Coronary angioplasty      RCA stent  . Cataract extraction w/phaco Left 09/04/2014    Procedure: CATARACT EXTRACTION PHACO AND INTRAOCULAR LENS PLACEMENT (IOC);  Surgeon: Tonny Branch, MD;  Location: [facility] ORS;  Service: Ophthalmology;  Laterality: Left;  CDE:9.84  . Cataract extraction w/phaco Right 11/09/2014    Procedure: CATARACT EXTRACTION PHACO AND INTRAOCULAR LENS PLACEMENT (IOC);  Surgeon: Tonny Branch, MD;  Location: [facility] ORS;  Service: Ophthalmology;  Laterality: Right;  CDE:8.40    Denies any headaches, dizziness, double vision, fevers, chills, night sweats, nausea, vomiting, diarrhea, constipation, chest pain, heart palpitations, shortness of breath, blood in stool, black tarry stool, urinary pain, urinary burning, urinary frequency, hematuria.   PHYSICAL EXAMINATION  ECOG PERFORMANCE STATUS: 0 - Asymptomatic  Filed Vitals:   11/24/14 0912  BP: 142/72  Pulse: 112  Temp: 97.9 F (36.6 C)  Resp: 16    GENERAL:alert,  no distress, well nourished, well developed, comfortable, cooperative, smiling and chronically-ill appearing with thickened appearing facial skin. SKIN: skin color, texture, turgor are normal, no rashes or significant lesions HEAD: Normocephalic, No masses, lesions, tenderness or abnormalities EYES: normal, PERRLA, EOMI, Conjunctiva are pink and non-injected EARS: External ears normal OROPHARYNX:lips, buccal mucosa, and tongue  normal and mucous membranes are moist  NECK: supple, no adenopathy, thyroid normal size, non-tender, without nodularity, no stridor, non-tender, trachea midline LYMPH:  no palpable lymphadenopathy, no hepatosplenomegaly BREAST:breasts appear normal, no suspicious masses, no skin or nipple changes or axillary nodes LUNGS: decreased breath sounds HEART: regular rate & rhythm, no murmurs, no gallops, S1 normal and S2 normal ABDOMEN:abdomen soft, non-tender, normal bowel sounds, no masses or organomegaly and no hepatosplenomegaly BACK: Back symmetric, no curvature. EXTREMITIES:less then 2 second capillary refill, no joint deformities, effusion, or inflammation, no edema, no skin discoloration, no cyanosis  NEURO: alert & oriented x 3 with fluent speech, no focal motor/sensory deficits, gait normal   LABORATORY DATA: CBC    Component Value Date/Time   WBC 7.9 08/28/2014 1355   RBC 4.34 08/28/2014 1355   HGB 13.3 08/28/2014 1355   HCT 41.0 08/28/2014 1355   PLT 247 08/28/2014 1355   MCV 94.5 08/28/2014 1355   MCH 30.6 08/28/2014 1355   MCHC 32.4 08/28/2014 1355   RDW 13.4 08/28/2014 1355   LYMPHSABS 3.0 04/21/2012 1024   MONOABS 0.6 04/21/2012 1024   EOSABS 0.2 04/21/2012 1024   BASOSABS 0.0 04/21/2012 1024      Chemistry      Component Value Date/Time   NA 140 08/28/2014 1355   K 3.9 08/28/2014 1355   CL 108 08/28/2014 1355   CO2 24 08/28/2014 1355   BUN 11 08/28/2014 1355   CREATININE 0.90 08/28/2014 1355      Component Value Date/Time   CALCIUM 9.2 08/28/2014 1355   ALKPHOS 67 12/24/2012 0710   AST 27 12/24/2012 0710   ALT 10 12/24/2012 0710   BILITOT 0.7 12/24/2012 0710        ASSESSMENT/PLAN:   Adenocarcinoma of left breast (HCC) Stage II (T1 C. N1 M0) adenocarcinoma of the left breast with surgery on 03/08/2001. She had a 1.5 cm cancer with 1/6 positive sentinel nodes. Reexcision margins were clear. She did participate in NSABP B.-30 randomized to Adriamycin and  Taxotere for 4 cycles followed by radiation therapy from 07/13/2001 to 08/27/2001. She then started Arimidex on 07/07/2001 took that until the end of June 2008. Thus far she has no evidence for recurrence.  No role for labs from an oncology perspective given lab results from 2-3 months ago.  She is 13 years out from her malignancy treatment.  Mammogram in May 2016 was BIRADS 1.  She will be due for another in May 2017.  Return in 1 year for follow-up.     THERAPY PLAN:  NCCN guidelines recommends the following surveillance for invasive breast cancer:  A. History and Physical exam every 4-6 months for 5 years and then every 12 months.  B. Mammography every 12 months  C. Women on Tamoxifen: annual gynecologic assessment every 12 months if uterus is present.  D. Women on aromatase inhibitor or who experience ovarian failure secondary to treatment should have monitoring of bone health with a bone mineral density determination at baseline and periodically thereafter.  E. Assess and encourage adherence to adjuvant endocrine therapy.  F. Evidence suggests that active lifestyle and achieving and maintaining an ideal body weight (20-25  BMI) may lead to optimal breast cancer outcomes.   All questions were answered. The patient knows to call the clinic with any problems, questions or concerns. We can certainly see the patient much sooner if necessary.  Patient and plan discussed with Dr. Ancil Linsey and she is in agreement with the aforementioned.   Margaret Thompson 11/24/2014  9:51 AM

## 2014-11-24 NOTE — Assessment & Plan Note (Signed)
Stage II (T1 C. N1 M0) adenocarcinoma of the left breast with surgery on 03/08/2001. She had a 1.5 cm cancer with 1/6 positive sentinel nodes. Reexcision margins were clear. She did participate in NSABP B.-30 randomized to Adriamycin and Taxotere for 4 cycles followed by radiation therapy from 07/13/2001 to 08/27/2001. She then started Arimidex on 07/07/2001 took that until the end of June 2008. Thus far she has no evidence for recurrence.  No role for labs from an oncology perspective given lab results from 2-3 months ago.  She is 13 years out from her malignancy treatment.  Mammogram in May 2016 was BIRADS 1.  She will be due for another in May 2017.  Return in 1 year for follow-up.

## 2014-12-13 DIAGNOSIS — I2510 Atherosclerotic heart disease of native coronary artery without angina pectoris: Secondary | ICD-10-CM | POA: Diagnosis not present

## 2014-12-13 DIAGNOSIS — I252 Old myocardial infarction: Secondary | ICD-10-CM | POA: Diagnosis not present

## 2014-12-13 DIAGNOSIS — E785 Hyperlipidemia, unspecified: Secondary | ICD-10-CM | POA: Diagnosis not present

## 2014-12-13 DIAGNOSIS — I10 Essential (primary) hypertension: Secondary | ICD-10-CM | POA: Diagnosis not present

## 2014-12-13 DIAGNOSIS — F17290 Nicotine dependence, other tobacco product, uncomplicated: Secondary | ICD-10-CM | POA: Diagnosis not present

## 2015-01-16 DIAGNOSIS — I10 Essential (primary) hypertension: Secondary | ICD-10-CM | POA: Diagnosis not present

## 2015-01-16 DIAGNOSIS — I2510 Atherosclerotic heart disease of native coronary artery without angina pectoris: Secondary | ICD-10-CM | POA: Diagnosis not present

## 2015-02-20 ENCOUNTER — Emergency Department (HOSPITAL_COMMUNITY): Payer: Medicare Other

## 2015-02-20 ENCOUNTER — Encounter (HOSPITAL_COMMUNITY): Payer: Self-pay

## 2015-02-20 ENCOUNTER — Emergency Department (HOSPITAL_COMMUNITY)
Admission: EM | Admit: 2015-02-20 | Discharge: 2015-02-20 | Discharge status: Against Medical Advice | Payer: Medicare Other | Attending: Emergency Medicine | Admitting: Emergency Medicine

## 2015-02-20 DIAGNOSIS — I10 Essential (primary) hypertension: Secondary | ICD-10-CM | POA: Insufficient documentation

## 2015-02-20 DIAGNOSIS — J189 Pneumonia, unspecified organism: Secondary | ICD-10-CM | POA: Diagnosis not present

## 2015-02-20 DIAGNOSIS — R5383 Other fatigue: Secondary | ICD-10-CM | POA: Insufficient documentation

## 2015-02-20 DIAGNOSIS — Z9889 Other specified postprocedural states: Secondary | ICD-10-CM | POA: Diagnosis not present

## 2015-02-20 DIAGNOSIS — Z853 Personal history of malignant neoplasm of breast: Secondary | ICD-10-CM | POA: Insufficient documentation

## 2015-02-20 DIAGNOSIS — F17210 Nicotine dependence, cigarettes, uncomplicated: Secondary | ICD-10-CM | POA: Diagnosis not present

## 2015-02-20 DIAGNOSIS — R05 Cough: Secondary | ICD-10-CM | POA: Diagnosis not present

## 2015-02-20 DIAGNOSIS — J159 Unspecified bacterial pneumonia: Secondary | ICD-10-CM | POA: Insufficient documentation

## 2015-02-20 DIAGNOSIS — Z79899 Other long term (current) drug therapy: Secondary | ICD-10-CM | POA: Diagnosis not present

## 2015-02-20 DIAGNOSIS — Z8669 Personal history of other diseases of the nervous system and sense organs: Secondary | ICD-10-CM | POA: Insufficient documentation

## 2015-02-20 DIAGNOSIS — E7800 Pure hypercholesterolemia, unspecified: Secondary | ICD-10-CM | POA: Insufficient documentation

## 2015-02-20 DIAGNOSIS — I252 Old myocardial infarction: Secondary | ICD-10-CM | POA: Diagnosis not present

## 2015-02-20 DIAGNOSIS — Z9861 Coronary angioplasty status: Secondary | ICD-10-CM | POA: Diagnosis not present

## 2015-02-20 DIAGNOSIS — Z8739 Personal history of other diseases of the musculoskeletal system and connective tissue: Secondary | ICD-10-CM | POA: Insufficient documentation

## 2015-02-20 DIAGNOSIS — Z7982 Long term (current) use of aspirin: Secondary | ICD-10-CM | POA: Insufficient documentation

## 2015-02-20 DIAGNOSIS — Z8719 Personal history of other diseases of the digestive system: Secondary | ICD-10-CM | POA: Diagnosis not present

## 2015-02-20 DIAGNOSIS — R000 Tachycardia, unspecified: Secondary | ICD-10-CM | POA: Insufficient documentation

## 2015-02-20 LAB — URINALYSIS, ROUTINE W REFLEX MICROSCOPIC
GLUCOSE, UA: NEGATIVE mg/dL
HGB URINE DIPSTICK: NEGATIVE
Nitrite: NEGATIVE
PH: 8 (ref 5.0–8.0)
PROTEIN: 30 mg/dL — AB
Specific Gravity, Urine: 1.015 (ref 1.005–1.030)

## 2015-02-20 LAB — COMPREHENSIVE METABOLIC PANEL
ALK PHOS: 69 U/L (ref 38–126)
ALT: 10 U/L — ABNORMAL LOW (ref 14–54)
AST: 19 U/L (ref 15–41)
Albumin: 3.1 g/dL — ABNORMAL LOW (ref 3.5–5.0)
Anion gap: 8 (ref 5–15)
BILIRUBIN TOTAL: 0.8 mg/dL (ref 0.3–1.2)
BUN: 10 mg/dL (ref 6–20)
CALCIUM: 8.8 mg/dL — AB (ref 8.9–10.3)
CO2: 29 mmol/L (ref 22–32)
CREATININE: 0.92 mg/dL (ref 0.44–1.00)
Chloride: 98 mmol/L — ABNORMAL LOW (ref 101–111)
GFR calc Af Amer: >60 mL/min (ref >60)
GFR, EST NON AFRICAN AMERICAN: 58 mL/min — AB (ref >60)
Glucose, Bld: 110 mg/dL — ABNORMAL HIGH (ref 65–99)
POTASSIUM: 3.2 mmol/L — AB (ref 3.5–5.1)
Sodium: 135 mmol/L (ref 135–145)
TOTAL PROTEIN: 7.1 g/dL (ref 6.5–8.1)

## 2015-02-20 LAB — URINE MICROSCOPIC-ADD ON

## 2015-02-20 LAB — TROPONIN I

## 2015-02-20 LAB — CBC WITH DIFFERENTIAL/PLATELET
BASOS ABS: 0 10*3/uL (ref 0.0–0.1)
BASOS PCT: 0 %
EOS ABS: 0.1 10*3/uL (ref 0.0–0.7)
EOS PCT: 1 %
HCT: 37.5 % (ref 36.0–46.0)
Hemoglobin: 12.4 g/dL (ref 12.0–15.0)
LYMPHS PCT: 12 %
Lymphs Abs: 1.1 10*3/uL (ref 0.7–4.0)
MCH: 30.4 pg (ref 26.0–34.0)
MCHC: 33.1 g/dL (ref 30.0–36.0)
MCV: 91.9 fL (ref 78.0–100.0)
Monocytes Absolute: 0.7 10*3/uL (ref 0.1–1.0)
Monocytes Relative: 7 %
Neutro Abs: 7.9 10*3/uL — ABNORMAL HIGH (ref 1.7–7.7)
Neutrophils Relative %: 80 %
PLATELETS: 298 10*3/uL (ref 150–400)
RBC: 4.08 MIL/uL (ref 3.87–5.11)
RDW: 12.8 % (ref 11.5–15.5)
WBC: 9.9 10*3/uL (ref 4.0–10.5)

## 2015-02-20 MED ORDER — LEVOFLOXACIN 500 MG PO TABS: 500.0000 mg | ORAL_TABLET | Freq: Every day | ORAL | Status: DC

## 2015-02-20 MED ORDER — ACETAMINOPHEN 325 MG PO TABS
650.0000 mg | ORAL_TABLET | Freq: Once | ORAL | Status: AC | PRN
  Administered 2015-02-20: 650 mg via ORAL
  Filled 2015-02-20: qty 2

## 2015-02-20 MED ORDER — CEFTRIAXONE SODIUM 1 G IJ SOLR
1.0000 g | Freq: Once | INTRAMUSCULAR | Status: AC
  Administered 2015-02-20: 1 g via INTRAVENOUS
  Filled 2015-02-20: qty 10

## 2015-02-20 NOTE — ED Notes (Signed)
Pt reports cough and fatigue since Sunday.  Denies fever.

## 2015-02-20 NOTE — ED Provider Notes (Signed)
CSN: [id number]     Arrival date & time 02/20/15  1130 History  By signing my name below, I, Hilda Lias, attest that this documentation has been prepared under the direction and in the presence of Davonna Belling, MD. Electronically Signed: Hilda Lias, ED Scribe. 02/20/2015. 12:23 PM.    Chief Complaint  Patient presents with  . Cough      Patient is a 82 y.o. female presenting with cough. The history is provided by the patient. No language interpreter was used.  Cough Associated symptoms: no chest pain, no fever, no shortness of breath and no sore throat    HPI Comments: KATHALEEN DUDZIAK is a 82 y.o. female who presents to the Emergency Department complaining of an intermittent, productive cough with clear phlegm that is worse during the day that has been present for two days. Pt reports feeling fatigued as well since her cough began two days ago and reports her appetite has been less prevalent than normal. Pt denies sore throat, congestion, fever, nausea, vomiting, diarrhea, chest pain, SOB, dysuria, blood in stool  Past Medical History  Diagnosis Date  . Osteoporosis   . Bilateral cataracts   . GERD (gastroesophageal reflux disease)   . Breast cancer ([place])   . Adenocarcinoma of left breast ([place]) 11/23/2012    Stage II (T1 C. N1 M0) adenocarcinoma of the left breast with surgery on 03/08/2001. She had a 1.5 cm cancer with 1/6 positive sentinel nodes. Reexcision margins were clear. She did participate in NSABP B.-30 randomized to Adriamycin and Taxotere for 4 cycles followed by radiation therapy from 07/13/2001 to 08/27/2001. She then started Arimidex on 07/07/2001 took that until the end of June 2008. T  . Acute MI ([place]) 2003  . Hypertension   . Hypercholesteremia   . History of gout    Past Surgical History  Procedure Laterality Date  . Breast surgery    . Abdominal hysterectomy    . Portacath placement    . Port-a-cath removal    . Percutaneous stent intervention      RCA   . Left heart catheterization with coronary angiogram N/A 12/21/2012    Procedure: LEFT HEART CATHETERIZATION WITH CORONARY ANGIOGRAM;  Surgeon: Clent Demark, MD;  Location: [facility] CATH LAB;  Service: Cardiovascular;  Laterality: N/A;  . Mastectomy, partial Left   . Coronary angioplasty      RCA stent  . Cataract extraction w/phaco Left 09/04/2014    Procedure: CATARACT EXTRACTION PHACO AND INTRAOCULAR LENS PLACEMENT (IOC);  Surgeon: Tonny Branch, MD;  Location: [facility] ORS;  Service: Ophthalmology;  Laterality: Left;  CDE:9.84  . Cataract extraction w/phaco Right 11/09/2014    Procedure: CATARACT EXTRACTION PHACO AND INTRAOCULAR LENS PLACEMENT (IOC);  Surgeon: Tonny Branch, MD;  Location: [facility] ORS;  Service: Ophthalmology;  Laterality: Right;  CDE:8.40   Family History  Problem Relation Age of Onset  . Stroke Mother    Social History  Substance Use Topics  . Smoking status: Current Every Day Smoker -- 0.50 packs/day for 50 years    Types: Cigarettes  . Smokeless tobacco: Never Used  . Alcohol Use: No   OB History    No data available     Review of Systems  Constitutional: Positive for appetite change and fatigue. Negative for fever.  HENT: Negative for congestion and sore throat.   Respiratory: Positive for cough. Negative for shortness of breath.   Cardiovascular: Negative for chest pain.  Gastrointestinal: Negative for nausea, vomiting, diarrhea and blood in  stool.  Genitourinary: Negative for dysuria.      Allergies  Azithromycin  Home Medications   Prior to Admission medications   Medication Sig Start Date End Date Taking? Authorizing Provider  aspirin EC 81 MG EC tablet Take 1 tablet (81 mg total) by mouth daily. 12/24/12  Yes Charolette Forward, MD  atorvastatin (LIPITOR) 80 MG tablet Take 80 mg by mouth every evening.    Yes Historical Provider, MD  CARTIA XT 180 MG 24 hr capsule Take 180 mg by mouth daily. 05/02/13  Yes Historical Provider, MD  nitroGLYCERIN (NITROSTAT) 0.4 MG SL  tablet Place 1 tablet (0.4 mg total) under the tongue every 5 (five) minutes x 3 doses as needed for chest pain. 12/24/12  Yes Charolette Forward, MD  Ticagrelor (BRILINTA) 90 MG TABS tablet Take 1 tablet (90 mg total) by mouth 2 (two) times daily. 12/24/12  Yes Charolette Forward, MD  fluticasone (FLONASE) 50 MCG/ACT nasal spray Place 1 spray into both nostrils daily. Patient not taking: Reported on 11/24/2014 02/08/14   Orpah Greek, MD  levofloxacin (LEVAQUIN) 500 MG tablet Take 1 tablet (500 mg total) by mouth daily. 02/20/15   Davonna Belling, MD   BP 120/64 mmHg  Pulse 82  Temp(Src) 99.1 F (37.3 C) (Oral)  Resp 22  Ht '5\' 2"'$  (1.575 m)  Wt 115 lb (52.164 kg)  BMI 21.03 kg/m2  SpO2 96% Physical Exam  Constitutional:  Appearance of lifelong smoker  Cardiovascular: Regular rhythm and normal heart sounds.   Mild tachycardia   Abdominal: Soft. She exhibits no mass. There is no tenderness. There is no rebound and no guarding.  Musculoskeletal: She exhibits no edema or tenderness.  No peripheral edema     ED Course  Procedures (including critical care time)  Results for orders placed or performed during the hospital encounter of 02/20/15  Comprehensive metabolic panel  Result Value Ref Range   Sodium 135 135 - 145 mmol/L   Potassium 3.2 (L) 3.5 - 5.1 mmol/L   Chloride 98 (L) 101 - 111 mmol/L   CO2 29 22 - 32 mmol/L   Glucose, Bld 110 (H) 65 - 99 mg/dL   BUN 10 6 - 20 mg/dL   Creatinine, Ser 0.92 0.44 - 1.00 mg/dL   Calcium 8.8 (L) 8.9 - 10.3 mg/dL   Total Protein 7.1 6.5 - 8.1 g/dL   Albumin 3.1 (L) 3.5 - 5.0 g/dL   AST 19 15 - 41 U/L   ALT 10 (L) 14 - 54 U/L   Alkaline Phosphatase 69 38 - 126 U/L   Total Bilirubin 0.8 0.3 - 1.2 mg/dL   GFR calc non Af Amer 58 (L) >60 mL/min   GFR calc Af Amer >60 >60 mL/min   Anion gap 8 5 - 15  CBC with Differential  Result Value Ref Range   WBC 9.9 4.0 - 10.5 K/uL   RBC 4.08 3.87 - 5.11 MIL/uL   Hemoglobin 12.4 12.0 - 15.0 g/dL    HCT 37.5 36.0 - 46.0 %   MCV 91.9 78.0 - 100.0 fL   MCH 30.4 26.0 - 34.0 pg   MCHC 33.1 30.0 - 36.0 g/dL   RDW 12.8 11.5 - 15.5 %   Platelets 298 150 - 400 K/uL   Neutrophils Relative % 80 %   Neutro Abs 7.9 (H) 1.7 - 7.7 K/uL   Lymphocytes Relative 12 %   Lymphs Abs 1.1 0.7 - 4.0 K/uL   Monocytes Relative 7 %  Monocytes Absolute 0.7 0.1 - 1.0 K/uL   Eosinophils Relative 1 %   Eosinophils Absolute 0.1 0.0 - 0.7 K/uL   Basophils Relative 0 %   Basophils Absolute 0.0 0.0 - 0.1 K/uL  Troponin I  Result Value Ref Range   Troponin I <0.03 <0.031 ng/mL  Urinalysis, Routine w reflex microscopic  Result Value Ref Range   Color, Urine YELLOW YELLOW   APPearance CLEAR CLEAR   Specific Gravity, Urine 1.015 1.005 - 1.030   pH 8.0 5.0 - 8.0   Glucose, UA NEGATIVE NEGATIVE mg/dL   Hgb urine dipstick NEGATIVE NEGATIVE   Bilirubin Urine SMALL (A) NEGATIVE   Ketones, ur TRACE (A) NEGATIVE mg/dL   Protein, ur 30 (A) NEGATIVE mg/dL   Nitrite NEGATIVE NEGATIVE   Leukocytes, UA MODERATE (A) NEGATIVE  Urine microscopic-add on  Result Value Ref Range   Squamous Epithelial / LPF 6-30 (A) NONE SEEN   WBC, UA 6-30 0 - 5 WBC/hpf   RBC / HPF 0-5 0 - 5 RBC/hpf   Bacteria, UA MANY (A) NONE SEEN   Dg Chest 2 View  02/20/2015  CLINICAL DATA:  82 year old presenting with 3 day history of cough and chest congestion. Current smoker. Prior MI with stent placement approximately 2 years ago. Personal history of left breast cancer. EXAM: CHEST  2 VIEW COMPARISON:  02/08/2014 and earlier. FINDINGS: Cardiac silhouette normal in size, unchanged. Right coronary artery stent. Thoracic aorta atherosclerotic, unchanged. Hilar and mediastinal contours otherwise unremarkable. Prominent bronchovascular markings diffusely and moderate central peribronchial thickening, unchanged. New patchy airspace opacities in the inferior right upper lobe and in the medial right lower lobe. No pleural effusions. No pneumothorax.  Degenerative changes involving the thoracic and upper lumbar spine. Prior left axillary node removal. IMPRESSION: Right upper lobe and right lower lobe bronchopneumonia superimposed upon COPD/asthma. Electronically Signed   By: Evangeline Dakin M.D.   On: 02/20/2015 12:24      Labs Review Labs Reviewed  COMPREHENSIVE METABOLIC PANEL - Abnormal; Notable for the following:    Potassium 3.2 (*)    Chloride 98 (*)    Glucose, Bld 110 (*)    Calcium 8.8 (*)    Albumin 3.1 (*)    ALT 10 (*)    GFR calc non Af Amer 58 (*)    All other components within normal limits  CBC WITH DIFFERENTIAL/PLATELET - Abnormal; Notable for the following:    Neutro Abs 7.9 (*)    All other components within normal limits  URINALYSIS, ROUTINE W REFLEX MICROSCOPIC (NOT AT [facility]) - Abnormal; Notable for the following:    Bilirubin Urine SMALL (*)    Ketones, ur TRACE (*)    Protein, ur 30 (*)    Leukocytes, UA MODERATE (*)    All other components within normal limits  URINE MICROSCOPIC-ADD ON - Abnormal; Notable for the following:    Squamous Epithelial / LPF 6-30 (*)    Bacteria, UA MANY (*)    All other components within normal limits  TROPONIN I    Imaging Review Dg Chest 2 View  02/20/2015  CLINICAL DATA:  82 year old presenting with 3 day history of cough and chest congestion. Current smoker. Prior MI with stent placement approximately 2 years ago. Personal history of left breast cancer. EXAM: CHEST  2 VIEW COMPARISON:  02/08/2014 and earlier. FINDINGS: Cardiac silhouette normal in size, unchanged. Right coronary artery stent. Thoracic aorta atherosclerotic, unchanged. Hilar and mediastinal contours otherwise unremarkable. Prominent bronchovascular markings diffusely and moderate  central peribronchial thickening, unchanged. New patchy airspace opacities in the inferior right upper lobe and in the medial right lower lobe. No pleural effusions. No pneumothorax. Degenerative changes involving the thoracic and  upper lumbar spine. Prior left axillary node removal. IMPRESSION: Right upper lobe and right lower lobe bronchopneumonia superimposed upon COPD/asthma. Electronically Signed   By: Evangeline Dakin M.D.   On: 02/20/2015 12:24   I have personally reviewed and evaluated these images and lab results as part of my medical decision-making.   EKG Interpretation   Date/Time:  Tuesday February 20 2015 12:47:26 EST Ventricular Rate:  107 PR Interval:  143 QRS Duration: 89 QT Interval:  338 QTC Calculation: 451 R Axis:   -31 Text Interpretation:  Sinus tachycardia Left axis deviation Low voltage,  extremity leads Baseline wander in lead(s) I III aVR aVL aVF Confirmed by  Alvino Chapel  MD, Ovid Curd [phone]) on 02/20/2015 1:33:20 PM      MDM   Final diagnoses:  CAP (community acquired pneumonia)    Patient with cough. Pneumonia on x-ray. With history of pulmonary disease I wanted to admit the patient, however she refused and left AMA.  I personally performed the services described in this documentation, which was scribed in my presence. The recorded information has been reviewed and is accurate.      Davonna Belling, MD 02/20/15 2202

## 2015-02-20 NOTE — Discharge Instructions (Signed)

## 2015-02-27 DIAGNOSIS — J189 Pneumonia, unspecified organism: Secondary | ICD-10-CM | POA: Diagnosis not present

## 2015-02-27 DIAGNOSIS — Z1389 Encounter for screening for other disorder: Secondary | ICD-10-CM | POA: Diagnosis not present

## 2015-03-08 DIAGNOSIS — Z1389 Encounter for screening for other disorder: Secondary | ICD-10-CM | POA: Diagnosis not present

## 2015-03-08 DIAGNOSIS — M25512 Pain in left shoulder: Secondary | ICD-10-CM | POA: Diagnosis not present

## 2015-03-14 DIAGNOSIS — E785 Hyperlipidemia, unspecified: Secondary | ICD-10-CM | POA: Diagnosis not present

## 2015-03-14 DIAGNOSIS — Z720 Tobacco use: Secondary | ICD-10-CM | POA: Diagnosis not present

## 2015-03-14 DIAGNOSIS — I252 Old myocardial infarction: Secondary | ICD-10-CM | POA: Diagnosis not present

## 2015-03-14 DIAGNOSIS — I2510 Atherosclerotic heart disease of native coronary artery without angina pectoris: Secondary | ICD-10-CM | POA: Diagnosis not present

## 2015-03-14 DIAGNOSIS — I10 Essential (primary) hypertension: Secondary | ICD-10-CM | POA: Diagnosis not present

## 2015-03-14 DIAGNOSIS — J45909 Unspecified asthma, uncomplicated: Secondary | ICD-10-CM | POA: Diagnosis not present

## 2015-05-02 DIAGNOSIS — N342 Other urethritis: Secondary | ICD-10-CM | POA: Diagnosis not present

## 2015-05-03 DIAGNOSIS — Z0000 Encounter for general adult medical examination without abnormal findings: Secondary | ICD-10-CM | POA: Diagnosis not present

## 2015-05-09 DIAGNOSIS — Z0000 Encounter for general adult medical examination without abnormal findings: Secondary | ICD-10-CM | POA: Diagnosis not present

## 2015-05-09 DIAGNOSIS — E782 Mixed hyperlipidemia: Secondary | ICD-10-CM | POA: Diagnosis not present

## 2015-05-09 DIAGNOSIS — Z719 Counseling, unspecified: Secondary | ICD-10-CM | POA: Diagnosis not present

## 2015-05-09 DIAGNOSIS — Z681 Body mass index (BMI) 19 or less, adult: Secondary | ICD-10-CM | POA: Diagnosis not present

## 2015-07-05 DIAGNOSIS — I2510 Atherosclerotic heart disease of native coronary artery without angina pectoris: Secondary | ICD-10-CM | POA: Diagnosis not present

## 2015-07-05 DIAGNOSIS — I10 Essential (primary) hypertension: Secondary | ICD-10-CM | POA: Diagnosis not present

## 2015-07-05 DIAGNOSIS — Z720 Tobacco use: Secondary | ICD-10-CM | POA: Diagnosis not present

## 2015-07-23 ENCOUNTER — Other Ambulatory Visit (HOSPITAL_COMMUNITY): Payer: Self-pay | Admitting: Internal Medicine

## 2015-07-23 DIAGNOSIS — Z1231 Encounter for screening mammogram for malignant neoplasm of breast: Secondary | ICD-10-CM

## 2015-07-26 ENCOUNTER — Ambulatory Visit (HOSPITAL_COMMUNITY)
Admission: RE | Admit: 2015-07-26 | Discharge: 2015-07-26 | Disposition: A | Discharge status: Home / Self Care | Payer: Medicare Other | Source: Ambulatory Visit | Attending: Internal Medicine | Admitting: Internal Medicine

## 2015-07-26 DIAGNOSIS — Z1231 Encounter for screening mammogram for malignant neoplasm of breast: Secondary | ICD-10-CM

## 2015-07-27 DIAGNOSIS — E782 Mixed hyperlipidemia: Secondary | ICD-10-CM | POA: Diagnosis not present

## 2015-07-27 DIAGNOSIS — N342 Other urethritis: Secondary | ICD-10-CM | POA: Diagnosis not present

## 2015-07-27 DIAGNOSIS — Z1389 Encounter for screening for other disorder: Secondary | ICD-10-CM | POA: Diagnosis not present

## 2015-09-14 DIAGNOSIS — Z6820 Body mass index (BMI) 20.0-20.9, adult: Secondary | ICD-10-CM | POA: Diagnosis not present

## 2015-09-14 DIAGNOSIS — N342 Other urethritis: Secondary | ICD-10-CM | POA: Diagnosis not present

## 2015-10-04 DIAGNOSIS — R0609 Other forms of dyspnea: Secondary | ICD-10-CM | POA: Diagnosis not present

## 2015-10-04 DIAGNOSIS — I2510 Atherosclerotic heart disease of native coronary artery without angina pectoris: Secondary | ICD-10-CM | POA: Diagnosis not present

## 2015-10-04 DIAGNOSIS — Z720 Tobacco use: Secondary | ICD-10-CM | POA: Diagnosis not present

## 2015-11-27 ENCOUNTER — Encounter (HOSPITAL_COMMUNITY): Payer: Medicare Other | Attending: Oncology | Admitting: Oncology

## 2015-11-27 ENCOUNTER — Ambulatory Visit (HOSPITAL_COMMUNITY): Payer: Medicare Other | Admitting: Oncology

## 2015-11-27 DIAGNOSIS — C50912 Malignant neoplasm of unspecified site of left female breast: Secondary | ICD-10-CM

## 2015-11-27 DIAGNOSIS — Z853 Personal history of malignant neoplasm of breast: Secondary | ICD-10-CM | POA: Diagnosis not present

## 2015-11-27 NOTE — Assessment & Plan Note (Addendum)
Stage II (T1 C. N1 M0) adenocarcinoma of the left breast with surgery on 03/08/2001. She had a 1.5 cm cancer with 1/6 positive sentinel nodes. Reexcision margins were clear. She did participate in NSABP B.-30 randomized to Adriamycin and Taxotere for 4 cycles followed by radiation therapy from 07/13/2001 to 08/27/2001. She then started Arimidex on 07/07/2001 took that until the end of June 2008. Thus far she has no evidence for recurrence.  No role for labs from an oncology perspective given lab results from 2-3 months ago.  She is 14 years out from her malignancy treatment.  Mammogram in 07/27/2015 was BIRADS 1.  She will be due for another in July 2018.  Based upon her smoking history, and ongoing tobacco abuse, she is a candidate for CT chest for screening purposes for lung cancer based upon the lung cancer screening guidelines.  HOWEVER, those guidelines are applicable up to 80 years old.  She is NOT interested in me pursuing this option at this time, but if she changes her mind, she will let us know.  She is advised to let us know before April 2018 at which time she becomes 82 years old.  Smoking cessation education is provided, but patient is not interested.  Return in 1 year for follow-up and breast exam.

## 2015-11-27 NOTE — Progress Notes (Signed)
[name]., MD [place] [address]  Adenocarcinoma of left breast [facility])  CURRENT THERAPY: Surveillance per NCCN guidelines.  INTERVAL HISTORY: Margaret Thompson 82 y.o. female returns for followup of Stage IIA (T1 C. N1 M0) adenocarcinoma of the left breast with surgery on 03/08/2001. She had a 1.5 cm cancer with 1/6 positive sentinel nodes. Reexcision margins were clear. She did participate in NSABP B.-30 randomized to Adriamycin and Taxotere for 4 cycles followed by radiation therapy from 07/13/2001 to 08/27/2001. She then started Arimidex on 07/07/2001 took that until the end of June 2008. Thus far she has no evidence for recurrence.  She is doing very well.  She performs her own breast exams and she denies any new or concerning findings.  She is up to date on her mammograms.  She continues to smoke cigarettes, reporting 10/day.  She is not interested in smoking cessation.  She continues to follow-up with her primary care provider and ongoing follow-up as directed is encouraged.  Lab work is performed by PCP as well.  Review of Systems  Constitutional: Negative.  Negative for chills, fever and weight loss.  HENT: Negative.   Eyes: Negative.  Negative for double vision.  Respiratory: Negative.  Negative for cough, hemoptysis, sputum production and shortness of breath.   Cardiovascular: Negative.  Negative for chest pain.  Gastrointestinal: Negative.  Negative for constipation, diarrhea, nausea and vomiting.  Genitourinary: Negative.   Musculoskeletal: Negative.   Skin: Negative.   Neurological: Negative.  Negative for weakness.  Endo/Heme/Allergies: Negative.   Psychiatric/Behavioral: Negative.     Past Medical History:  Diagnosis Date  . Acute MI 2003  . Adenocarcinoma of left breast ([place]) 11/23/2012   Stage II (T1 C. N1 M0) adenocarcinoma of the left breast with surgery on 03/08/2001. She had a 1.5 cm cancer with 1/6 positive sentinel  nodes. Reexcision margins were clear. She did participate in NSABP B.-30 randomized to Adriamycin and Taxotere for 4 cycles followed by radiation therapy from 07/13/2001 to 08/27/2001. She then started Arimidex on 07/07/2001 took that until the end of June 2008. T  . Bilateral cataracts   . Breast cancer ([place])   . GERD (gastroesophageal reflux disease)   . History of gout   . Hypercholesteremia   . Hypertension   . Osteoporosis     Past Surgical History:  Procedure Laterality Date  . ABDOMINAL HYSTERECTOMY    . BREAST SURGERY    . CATARACT EXTRACTION W/PHACO Left 09/04/2014   Procedure: CATARACT EXTRACTION PHACO AND INTRAOCULAR LENS PLACEMENT (IOC);  Surgeon: Tonny Branch, MD;  Location: [facility];  Service: Ophthalmology;  Laterality: Left;  CDE:9.84  . CATARACT EXTRACTION W/PHACO Right 11/09/2014   Procedure: CATARACT EXTRACTION PHACO AND INTRAOCULAR LENS PLACEMENT (IOC);  Surgeon: Tonny Branch, MD;  Location: [facility];  Service: Ophthalmology;  Laterality: Right;  CDE:8.40  . CORONARY ANGIOPLASTY     RCA stent  . LEFT HEART CATHETERIZATION WITH CORONARY ANGIOGRAM N/A 12/21/2012   Procedure: LEFT HEART CATHETERIZATION WITH CORONARY ANGIOGRAM;  Surgeon: Clent Demark, MD;  Location: [facility] CATH LAB;  Service: Cardiovascular;  Laterality: N/A;  . MASTECTOMY, PARTIAL Left   . PERCUTANEOUS STENT INTERVENTION     RCA  . PORT-A-CATH REMOVAL    . PORTACATH PLACEMENT      Family History  Problem Relation Age of Onset  . Stroke Mother     Social History   Social History  . Marital status: Widowed  Spouse name: N/A  . Number of children: N/A  . Years of education: N/A   Social History Main Topics  . Smoking status: Current Every Day Smoker    Packs/day: 0.50    Years: 50.00    Types: Cigarettes  . Smokeless tobacco: Never Used  . Alcohol use No  . Drug use: No  . Sexual activity: Not on file   Other Topics Concern  . Not on file   Social History Narrative  . No narrative on file       PHYSICAL EXAMINATION  ECOG PERFORMANCE STATUS: 1 - Symptomatic but completely ambulatory  Vitals:   11/27/15 1057  BP: 129/61  Pulse: (!) 103  Resp: 16  Temp: 98.8 F (37.1 C)    GENERAL:alert, no distress, well developed, comfortable, cooperative, smiling and thickened facial skin. SKIN: skin color, texture, turgor are normal, no rashes or significant lesions HEAD: Normocephalic, No masses, lesions, tenderness or abnormalities EYES: normal, EOMI, Conjunctiva are pink and non-injected EARS: External ears normal OROPHARYNX:lips, buccal mucosa, and tongue normal and mucous membranes are moist  NECK: supple, no adenopathy, trachea midline LYMPH:  no palpable lymphadenopathy, no hepatosplenomegaly BREAST:right breast normal without mass, skin or nipple changes or axillary nodes, post-lumpectomy on left site well healed and free of suspicious changes with thickened breast tissue LUNGS: clear to auscultation and percussion, decreased breath sounds HEART: regular rate & rhythm, no murmurs, no gallops, S1 normal, S2 normal and physiological split S2 ABDOMEN:abdomen soft, non-tender, obese, normal bowel sounds and no masses or organomegaly BACK: Back symmetric, no curvature., No CVA tenderness EXTREMITIES:less then 2 second capillary refill, no joint deformities, effusion, or inflammation, no edema, no skin discoloration, no cyanosis  NEURO: alert & oriented x 3 with fluent speech, no focal motor/sensory deficits, gait normal   LABORATORY DATA: CBC    Component Value Date/Time   WBC 9.9 02/20/2015 1234   RBC 4.08 02/20/2015 1234   HGB 12.4 02/20/2015 1234   HCT 37.5 02/20/2015 1234   PLT 298 02/20/2015 1234   MCV 91.9 02/20/2015 1234   MCH 30.4 02/20/2015 1234   MCHC 33.1 02/20/2015 1234   RDW 12.8 02/20/2015 1234   LYMPHSABS 1.1 02/20/2015 1234   MONOABS 0.7 02/20/2015 1234   EOSABS 0.1 02/20/2015 1234   BASOSABS 0.0 02/20/2015 1234      Chemistry      Component  Value Date/Time   NA 135 02/20/2015 1234   K 3.2 (L) 02/20/2015 1234   CL 98 (L) 02/20/2015 1234   CO2 29 02/20/2015 1234   BUN 10 02/20/2015 1234   CREATININE 0.92 02/20/2015 1234      Component Value Date/Time   CALCIUM 8.8 (L) 02/20/2015 1234   ALKPHOS 69 02/20/2015 1234   AST 19 02/20/2015 1234   ALT 10 (L) 02/20/2015 1234   BILITOT 0.8 02/20/2015 1234        PENDING LABS:   RADIOGRAPHIC STUDIES:  No results found.   PATHOLOGY:    ASSESSMENT AND PLAN:  Adenocarcinoma of left breast ([place]) Stage II (T1 C. N1 M0) adenocarcinoma of the left breast with surgery on 03/08/2001. She had a 1.5 cm cancer with 1/6 positive sentinel nodes. Reexcision margins were clear. She did participate in NSABP B.-30 randomized to Adriamycin and Taxotere for 4 cycles followed by radiation therapy from 07/13/2001 to 08/27/2001. She then started Arimidex on 07/07/2001 took that until the end of June 2008. Thus far she has no evidence for recurrence.  No role for  labs from an oncology perspective given lab results from 2-3 months ago.  She is 14 years out from her malignancy treatment.  Mammogram in 07/27/2015 was BIRADS 1.  She will be due for another in July 2018.  Based upon her smoking history, and ongoing tobacco abuse, she is a candidate for CT chest for screening purposes for lung cancer based upon the lung cancer screening guidelines.  HOWEVER, those guidelines are applicable up to 80 years old.  She is NOT interested in me pursuing this option at this time, but if she changes her mind, she will let us know.  She is advised to let us know before April 2018 at which time she becomes 82 years old.  Return in 1 year for follow-up and breast exam.    ORDERS PLACED FOR THIS ENCOUNTER: No orders of the defined types were placed in this encounter.   MEDICATIONS PRESCRIBED THIS ENCOUNTER: No orders of the defined types were placed in this encounter.   THERAPY PLAN:  NCCN guidelines  recommends the following surveillance for invasive breast cancer (2.2017):  A. History and Physical exam 1-4 times per year as clinically appropriate for 5 years, then annually.  B. Periodic screening for changes in family history and referral to genetics counseling as indicated  C. Educate, monitor, and refer to lymphedema management.  D. Mammography every 12 months  E. Routine imaging of reconstructed breast is not indicated.  F. In the absence of clinical signs and symptoms suggestive of recurrent disease, there is no indication for laboratory or imaging studies for metastases screening.  G. Women on Tamoxifen: annual gynecologic assessment every 12 months if uterus is present.  H. Women on aromatase inhibitor or who experience ovarian failure secondary to treatment should have monitoring of bone health with a bone mineral density determination at baseline and periodically thereafter.  I. Assess and encourage adherence to adjuvant endocrine therapy.  J. Evidence suggests that active lifestyle, healthy diet, limited alcohol intake, and achieving and maintaining an ideal body weight (20-25 BMI) may lead to optimal breast cancer outcomes.   All questions were answered. The patient knows to call the clinic with any problems, questions or concerns. We can certainly see the patient much sooner if necessary.  Patient and plan discussed with Dr. Ancil Linsey and she is in agreement with the aforementioned.   This note is electronically signed by: Robynn Pane, PA-C 11/27/2015 11:19 AM

## 2015-11-27 NOTE — Patient Instructions (Addendum)
Margaret Thompson at [facility] Discharge Instructions  RECOMMENDATIONS MADE BY THE CONSULTANT AND ANY TEST RESULTS WILL BE SENT TO YOUR REFERRING PHYSICIAN.  You were seen today by Kirby Crigler PA-C. Mammogram in July 2018. Return for a follow up in 12 months.    Thank you for choosing [place] at [facility] to provide your oncology and hematology care.  To afford each patient quality time with our provider, please arrive at least 15 minutes before your scheduled appointment time.   Beginning January 23rd 2017 lab work for the [facility] will be done in the  Main lab at [facility] on 1st floor. If you have a lab appointment with the [facility] please come in thru the  Main Entrance and check in at the main information desk  You need to re-schedule your appointment should you arrive 10 or more minutes late.  We strive to give you quality time with our providers, and arriving late affects you and other patients whose appointments are after yours.  Also, if you no show three or more times for appointments you may be dismissed from the clinic at the providers discretion.     Again, thank you for choosing [facility].  Our hope is that these requests will decrease the amount of time that you wait before being seen by our physicians.       _____________________________________________________________  Should you have questions after your visit to [facility], please contact our office at [phone] between the hours of 8:30 a.m. and 4:30 p.m.  Voicemails left after 4:30 p.m. will not be returned until the following business day.  For prescription refill requests, have your pharmacy contact our office.         Resources For Cancer Patients and their Caregivers ? American Cancer Society: Can assist with transportation, wigs, general needs, runs Look Good Feel Better.        2231950188 ? Cancer Care: Provides  financial assistance, online support groups, medication/co-pay assistance.  1-800-813-HOPE 802-781-6866) ? [facility] [place] Assists [place] cancer patients and their families through emotional , educational and financial support.  779-852-4632 ? [place] DSS Where to apply for food stamps, Medicaid and utility assistance. 279-717-9130 ? [facility]: Transportation to medical appointments. (978) 797-8785 ? Social Security Administration: May apply for disability if have a Stage IV cancer. 562-376-3318 510-870-1840 ? [employer], Disability and Transit Services: Assists with nutrition, care and transit needs. [place] Support Programs: '@10RELATIVEDAYS'$ @ > Cancer Support Group  2nd Tuesday of the month 1pm-2pm, Journey Room  > Creative Journey  3rd Tuesday of the month 1130am-1pm, Journey Room  > Look Good Feel Better  1st Wednesday of the month 10am-12 noon, Journey Room (Call [facility] to register [phone])

## 2015-12-28 DIAGNOSIS — H5213 Myopia, bilateral: Secondary | ICD-10-CM | POA: Diagnosis not present

## 2015-12-28 DIAGNOSIS — Z961 Presence of intraocular lens: Secondary | ICD-10-CM | POA: Diagnosis not present

## 2015-12-28 DIAGNOSIS — H52222 Regular astigmatism, left eye: Secondary | ICD-10-CM | POA: Diagnosis not present

## 2015-12-28 DIAGNOSIS — H524 Presbyopia: Secondary | ICD-10-CM | POA: Diagnosis not present

## 2016-01-03 DIAGNOSIS — I10 Essential (primary) hypertension: Secondary | ICD-10-CM | POA: Diagnosis not present

## 2016-01-03 DIAGNOSIS — I2510 Atherosclerotic heart disease of native coronary artery without angina pectoris: Secondary | ICD-10-CM | POA: Diagnosis not present

## 2016-01-03 DIAGNOSIS — Z720 Tobacco use: Secondary | ICD-10-CM | POA: Diagnosis not present

## 2016-02-04 DIAGNOSIS — N342 Other urethritis: Secondary | ICD-10-CM | POA: Diagnosis not present

## 2016-02-04 DIAGNOSIS — R350 Frequency of micturition: Secondary | ICD-10-CM | POA: Diagnosis not present

## 2016-02-04 DIAGNOSIS — R319 Hematuria, unspecified: Secondary | ICD-10-CM | POA: Diagnosis not present

## 2016-02-04 DIAGNOSIS — Z6820 Body mass index (BMI) 20.0-20.9, adult: Secondary | ICD-10-CM | POA: Diagnosis not present

## 2016-02-04 DIAGNOSIS — Z1389 Encounter for screening for other disorder: Secondary | ICD-10-CM | POA: Diagnosis not present

## 2016-04-10 DIAGNOSIS — I2510 Atherosclerotic heart disease of native coronary artery without angina pectoris: Secondary | ICD-10-CM | POA: Diagnosis not present

## 2016-04-10 DIAGNOSIS — I10 Essential (primary) hypertension: Secondary | ICD-10-CM | POA: Diagnosis not present

## 2016-04-10 DIAGNOSIS — Z720 Tobacco use: Secondary | ICD-10-CM | POA: Diagnosis not present

## 2016-04-15 DIAGNOSIS — I2510 Atherosclerotic heart disease of native coronary artery without angina pectoris: Secondary | ICD-10-CM | POA: Diagnosis not present

## 2016-04-15 DIAGNOSIS — I10 Essential (primary) hypertension: Secondary | ICD-10-CM | POA: Diagnosis not present

## 2016-04-15 DIAGNOSIS — E785 Hyperlipidemia, unspecified: Secondary | ICD-10-CM | POA: Diagnosis not present

## 2016-06-02 ENCOUNTER — Encounter (HOSPITAL_COMMUNITY): Payer: Self-pay | Admitting: Emergency Medicine

## 2016-06-02 ENCOUNTER — Emergency Department (HOSPITAL_COMMUNITY)
Admission: EM | Admit: 2016-06-02 | Discharge: 2016-06-02 | Disposition: A | Discharge status: Home / Self Care | Payer: Medicare Other | Attending: Emergency Medicine | Admitting: Emergency Medicine

## 2016-06-02 ENCOUNTER — Emergency Department (HOSPITAL_COMMUNITY): Payer: Medicare Other

## 2016-06-02 DIAGNOSIS — I10 Essential (primary) hypertension: Secondary | ICD-10-CM | POA: Insufficient documentation

## 2016-06-02 DIAGNOSIS — J40 Bronchitis, not specified as acute or chronic: Secondary | ICD-10-CM | POA: Insufficient documentation

## 2016-06-02 DIAGNOSIS — Z7982 Long term (current) use of aspirin: Secondary | ICD-10-CM | POA: Diagnosis not present

## 2016-06-02 DIAGNOSIS — Z79899 Other long term (current) drug therapy: Secondary | ICD-10-CM | POA: Diagnosis not present

## 2016-06-02 DIAGNOSIS — F17210 Nicotine dependence, cigarettes, uncomplicated: Secondary | ICD-10-CM | POA: Insufficient documentation

## 2016-06-02 DIAGNOSIS — Z853 Personal history of malignant neoplasm of breast: Secondary | ICD-10-CM | POA: Insufficient documentation

## 2016-06-02 DIAGNOSIS — R05 Cough: Secondary | ICD-10-CM | POA: Diagnosis not present

## 2016-06-02 LAB — CBC WITH DIFFERENTIAL/PLATELET
BASOS ABS: 0.1 10*3/uL (ref 0.0–0.1)
BASOS PCT: 1 %
EOS ABS: 0.5 10*3/uL (ref 0.0–0.7)
Eosinophils Relative: 6 %
HCT: 40.7 % (ref 36.0–46.0)
Hemoglobin: 13.4 g/dL (ref 12.0–15.0)
Lymphocytes Relative: 48 %
Lymphs Abs: 4.1 10*3/uL — ABNORMAL HIGH (ref 0.7–4.0)
MCH: 31.5 pg (ref 26.0–34.0)
MCHC: 32.9 g/dL (ref 30.0–36.0)
MCV: 95.5 fL (ref 78.0–100.0)
MONO ABS: 0.5 10*3/uL (ref 0.1–1.0)
MONOS PCT: 6 %
NEUTROS ABS: 3.2 10*3/uL (ref 1.7–7.7)
Neutrophils Relative %: 39 %
Platelets: 283 10*3/uL (ref 150–400)
RBC: 4.26 MIL/uL (ref 3.87–5.11)
RDW: 13.1 % (ref 11.5–15.5)
WBC: 8.3 10*3/uL (ref 4.0–10.5)

## 2016-06-02 LAB — BASIC METABOLIC PANEL
Anion gap: 9 (ref 5–15)
BUN: 8 mg/dL (ref 6–20)
CO2: 26 mmol/L (ref 22–32)
Calcium: 9.3 mg/dL (ref 8.9–10.3)
Chloride: 105 mmol/L (ref 101–111)
Creatinine, Ser: 0.86 mg/dL (ref 0.44–1.00)
GFR calc non Af Amer: >60 mL/min (ref >60)
Glucose, Bld: 86 mg/dL (ref 65–99)
Potassium: 3.6 mmol/L (ref 3.5–5.1)
SODIUM: 140 mmol/L (ref 135–145)

## 2016-06-02 MED ORDER — IPRATROPIUM-ALBUTEROL 0.5-2.5 (3) MG/3ML IN SOLN: 3.0000 mL | RESPIRATORY_TRACT | Status: DC

## 2016-06-02 MED ORDER — PREDNISONE 20 MG PO TABS: ORAL_TABLET | ORAL | 0 refills | Status: DC

## 2016-06-02 MED ORDER — PREDNISONE 50 MG PO TABS
60.0000 mg | ORAL_TABLET | Freq: Once | ORAL | Status: AC
  Administered 2016-06-02: 60 mg via ORAL
  Filled 2016-06-02: qty 1

## 2016-06-02 MED ORDER — IPRATROPIUM-ALBUTEROL 0.5-2.5 (3) MG/3ML IN SOLN
3.0000 mL | Freq: Once | RESPIRATORY_TRACT | Status: AC
  Administered 2016-06-02: 3 mL via RESPIRATORY_TRACT
  Filled 2016-06-02: qty 3

## 2016-06-02 MED ORDER — LEVOFLOXACIN 750 MG PO TABS
750.0000 mg | ORAL_TABLET | Freq: Once | ORAL | Status: AC
  Administered 2016-06-02: 750 mg via ORAL
  Filled 2016-06-02: qty 1

## 2016-06-02 MED ORDER — LEVOFLOXACIN 750 MG PO TABS: 750.0000 mg | ORAL_TABLET | Freq: Every day | ORAL | 0 refills | Status: DC

## 2016-06-02 MED ORDER — ALBUTEROL SULFATE HFA 108 (90 BASE) MCG/ACT IN AERS
2.0000 | INHALATION_SPRAY | Freq: Once | RESPIRATORY_TRACT | Status: AC
  Administered 2016-06-02: 2 via RESPIRATORY_TRACT
  Filled 2016-06-02: qty 6.7

## 2016-06-02 NOTE — Progress Notes (Signed)
Educated patient on spacer use with inhaler before discharge.

## 2016-06-02 NOTE — ED Provider Notes (Signed)
[facility] DEPT Provider Note   CSN: [id number] Arrival date & time: 06/02/16  1621     History   Chief Complaint Chief Complaint  Patient presents with  . Cough  . Numbness    HPI Margaret Thompson is a 83 y.o. female.   Cough  This is a recurrent problem. The current episode started more than 1 week ago. The problem occurs constantly. The problem has been gradually worsening. The cough is productive of sputum. There has been no fever. Associated symptoms include ear congestion, rhinorrhea, sore throat, shortness of breath and wheezing. Pertinent negatives include no chest pain. She has tried mist for the symptoms. The treatment provided no relief. She is a smoker.    Past Medical History:  Diagnosis Date  . Acute MI ([place]) 2003  . Adenocarcinoma of left breast ([place]) 11/23/2012   Stage II (T1 C. N1 M0) adenocarcinoma of the left breast with surgery on 03/08/2001. She had a 1.5 cm cancer with 1/6 positive sentinel nodes. Reexcision margins were clear. She did participate in NSABP B.-30 randomized to Adriamycin and Taxotere for 4 cycles followed by radiation therapy from 07/13/2001 to 08/27/2001. She then started Arimidex on 07/07/2001 took that until the end of June 2008. T  . Bilateral cataracts   . Breast cancer ([place])   . GERD (gastroesophageal reflux disease)   . History of gout   . Hypercholesteremia   . Hypertension   . Osteoporosis     Patient Active Problem List   Diagnosis Date Noted  . Acute MI, inferoposterior wall ([place]) 12/21/2012  . Adenocarcinoma of left breast ([place]) 11/23/2012  . Bursitis, shoulder 05/25/2012    Past Surgical History:  Procedure Laterality Date  . ABDOMINAL HYSTERECTOMY    . BREAST SURGERY    . CATARACT EXTRACTION W/PHACO Left 09/04/2014   Procedure: CATARACT EXTRACTION PHACO AND INTRAOCULAR LENS PLACEMENT (IOC);  Surgeon: Tonny Branch, MD;  Location: [facility];  Service: Ophthalmology;  Laterality: Left;  CDE:9.84  . CATARACT EXTRACTION  W/PHACO Right 11/09/2014   Procedure: CATARACT EXTRACTION PHACO AND INTRAOCULAR LENS PLACEMENT (IOC);  Surgeon: Tonny Branch, MD;  Location: [facility];  Service: Ophthalmology;  Laterality: Right;  CDE:8.40  . CORONARY ANGIOPLASTY     RCA stent  . LEFT HEART CATHETERIZATION WITH CORONARY ANGIOGRAM N/A 12/21/2012   Procedure: LEFT HEART CATHETERIZATION WITH CORONARY ANGIOGRAM;  Surgeon: Clent Demark, MD;  Location: [facility] CATH LAB;  Service: Cardiovascular;  Laterality: N/A;  . MASTECTOMY, PARTIAL Left   . PERCUTANEOUS STENT INTERVENTION     RCA  . PORT-A-CATH REMOVAL    . PORTACATH PLACEMENT      OB History    No data available       Home Medications    Prior to Admission medications   Medication Sig Start Date End Date Taking? Authorizing Provider  aspirin EC 81 MG EC tablet Take 1 tablet (81 mg total) by mouth daily. 12/24/12   Charolette Forward, MD  atorvastatin (LIPITOR) 80 MG tablet Take 80 mg by mouth every evening.     [provider]  CARTIA XT 180 MG 24 hr capsule Take 180 mg by mouth daily. 05/02/13   [provider]  fluticasone (FLONASE) 50 MCG/ACT nasal spray Place 1 spray into both nostrils daily. 02/08/14   Orpah Greek, MD  levofloxacin (LEVAQUIN) 750 MG tablet Take 1 tablet (750 mg total) by mouth daily. X 7 days 06/02/16   Mace Weinberg, Corene Cornea, MD  nitroGLYCERIN (NITROSTAT) 0.4 MG SL  tablet Place 1 tablet (0.4 mg total) under the tongue every 5 (five) minutes x 3 doses as needed for chest pain. Patient not taking: Reported on 11/27/2015 12/24/12   Charolette Forward, MD  predniSONE (DELTASONE) 20 MG tablet 2 tabs po daily x 4 days 06/02/16   Fany Cavanaugh, Corene Cornea, MD    Family History Family History  Problem Relation Age of Onset  . Stroke Mother     Social History Social History  Substance Use Topics  . Smoking status: Current Every Day Smoker    Packs/day: 0.50    Years: 50.00    Types: Cigarettes  . Smokeless tobacco: Never Used  . Alcohol use No      Allergies   Azithromycin   Review of Systems Review of Systems  HENT: Positive for rhinorrhea and sore throat.   Respiratory: Positive for cough, shortness of breath and wheezing.   Cardiovascular: Negative for chest pain.  All other systems reviewed and are negative.    Physical Exam Updated Vital Signs BP (!) 145/77 (BP Location: Left Arm)   Pulse 95   Temp (!) 95.8 F (35.4 C) (Temporal)   Resp 18   Ht '5\' 2"'$  (1.575 m)   Wt 55.3 kg (122 lb)   SpO2 97%   BMI 22.31 kg/m   Physical Exam  Constitutional: She is oriented to person, place, and time. She appears well-developed and well-nourished. No distress.  HENT:  Head: Normocephalic and atraumatic.  Eyes: Conjunctivae and EOM are normal.  Neck: Normal range of motion.  Cardiovascular: Normal rate and regular rhythm.  Exam reveals no friction rub.   No murmur heard. Pulmonary/Chest: Effort normal. No stridor. No respiratory distress. She has wheezes. She has rales (RLL).  Abdominal: Soft. She exhibits no distension.  Neurological: She is alert and oriented to person, place, and time. No cranial nerve deficit. Coordination normal.  Skin: Skin is warm and dry.  Nursing note and vitals reviewed.    ED Treatments / Results  Labs (all labs ordered are listed, but only abnormal results are displayed) Labs Reviewed  CBC WITH DIFFERENTIAL/PLATELET - Abnormal; Notable for the following:       Result Value   Lymphs Abs 4.1 (*)    All other components within normal limits  BASIC METABOLIC PANEL    EKG  EKG Interpretation  Date/Time:  Monday Jun 02 2016 19:47:33 EDT Ventricular Rate:  83 PR Interval:    QRS Duration: 95 QT Interval:  383 QTC Calculation: 450 R Axis:   -43 Text Interpretation:  Sinus arrhythmia Left axis deviation No significant change since last tracing Confirmed by Merrily Pew [phone]) on 06/02/2016 7:54:54 PM       Radiology Dg Chest 2 View  Result Date: 06/02/2016 CLINICAL DATA:   Cough EXAM: CHEST  2 VIEW COMPARISON:  03/20/2015 FINDINGS: Cardiac shadow is within normal limits. The lungs are mildly hyperinflated consistent with COPD. No acute infiltrate is seen. No bony abnormality is noted. IMPRESSION: No acute abnormality noted. COPD. Electronically Signed   By: Inez Catalina M.D.   On: 06/02/2016 17:52    Procedures Procedures (including critical care time)  Medications Ordered in ED Medications  levofloxacin (LEVAQUIN) tablet 750 mg (not administered)  albuterol (PROVENTIL HFA;VENTOLIN HFA) 108 (90 Base) MCG/ACT inhaler 2 puff (not administered)  predniSONE (DELTASONE) tablet 60 mg (60 mg Oral Given 06/02/16 1847)  ipratropium-albuterol (DUONEB) 0.5-2.5 (3) MG/3ML nebulizer solution 3 mL (3 mLs Nebulization Given 06/02/16 1855)     Initial Impression /  Assessment and Plan / ED Course  I have reviewed the triage vital signs and the nursing notes.  Pertinent labs & imaging results that were available during my care of the patient were reviewed by me and considered in my medical decision making (see chart for details).    Suspect undiagnosed copd exacerbation from likely RLL CAP. Will treat appropriately.  Reeval and patietn with signficant improvement subjectively and on exam. Low suspicion for PE. Will start abx, dc on same with steroid burst and close PCP follow up.   Final Clinical Impressions(s) / ED Diagnoses   Final diagnoses:  Bronchitis    New Prescriptions New Prescriptions   LEVOFLOXACIN (LEVAQUIN) 750 MG TABLET    Take 1 tablet (750 mg total) by mouth daily. X 7 days   PREDNISONE (DELTASONE) 20 MG TABLET    2 tabs po daily x 4 days     Merrily Pew, MD 06/02/16 1958

## 2016-06-02 NOTE — ED Triage Notes (Signed)
Patient complaining of coughing up tan colored sputum x 1 week. Also complaining of numbness to left arm off and on x 1 week. Denies numbness at this time.

## 2016-06-02 NOTE — ED Notes (Signed)
Pt ambulatory to waiting room. Pt verbalized understanding of discharge instructions.   

## 2016-07-04 DIAGNOSIS — R0789 Other chest pain: Secondary | ICD-10-CM | POA: Diagnosis not present

## 2016-07-04 DIAGNOSIS — I252 Old myocardial infarction: Secondary | ICD-10-CM | POA: Diagnosis not present

## 2016-07-04 DIAGNOSIS — I2510 Atherosclerotic heart disease of native coronary artery without angina pectoris: Secondary | ICD-10-CM | POA: Diagnosis not present

## 2016-07-04 DIAGNOSIS — J45909 Unspecified asthma, uncomplicated: Secondary | ICD-10-CM | POA: Diagnosis not present

## 2016-07-04 DIAGNOSIS — I10 Essential (primary) hypertension: Secondary | ICD-10-CM | POA: Diagnosis not present

## 2016-07-08 ENCOUNTER — Other Ambulatory Visit (HOSPITAL_COMMUNITY): Payer: Self-pay | Admitting: Internal Medicine

## 2016-07-08 DIAGNOSIS — Z1231 Encounter for screening mammogram for malignant neoplasm of breast: Secondary | ICD-10-CM

## 2016-07-28 ENCOUNTER — Ambulatory Visit (HOSPITAL_COMMUNITY)
Admission: RE | Admit: 2016-07-28 | Discharge: 2016-07-28 | Disposition: A | Discharge status: Home / Self Care | Payer: Medicare Other | Source: Ambulatory Visit | Attending: Internal Medicine | Admitting: Internal Medicine

## 2016-07-28 ENCOUNTER — Encounter (HOSPITAL_COMMUNITY): Payer: Self-pay

## 2016-07-28 DIAGNOSIS — Z1231 Encounter for screening mammogram for malignant neoplasm of breast: Secondary | ICD-10-CM | POA: Insufficient documentation

## 2016-07-29 ENCOUNTER — Emergency Department (HOSPITAL_COMMUNITY)
Admission: EM | Admit: 2016-07-29 | Discharge: 2016-07-29 | Disposition: A | Discharge status: Home / Self Care | Payer: Medicare Other | Attending: Emergency Medicine | Admitting: Emergency Medicine

## 2016-07-29 ENCOUNTER — Encounter (HOSPITAL_COMMUNITY): Payer: Self-pay | Admitting: Emergency Medicine

## 2016-07-29 DIAGNOSIS — L247 Irritant contact dermatitis due to plants, except food: Secondary | ICD-10-CM | POA: Diagnosis not present

## 2016-07-29 DIAGNOSIS — Z955 Presence of coronary angioplasty implant and graft: Secondary | ICD-10-CM | POA: Insufficient documentation

## 2016-07-29 DIAGNOSIS — I10 Essential (primary) hypertension: Secondary | ICD-10-CM | POA: Insufficient documentation

## 2016-07-29 DIAGNOSIS — Z7982 Long term (current) use of aspirin: Secondary | ICD-10-CM | POA: Insufficient documentation

## 2016-07-29 DIAGNOSIS — Z79899 Other long term (current) drug therapy: Secondary | ICD-10-CM | POA: Diagnosis not present

## 2016-07-29 DIAGNOSIS — R21 Rash and other nonspecific skin eruption: Secondary | ICD-10-CM | POA: Diagnosis present

## 2016-07-29 DIAGNOSIS — Z853 Personal history of malignant neoplasm of breast: Secondary | ICD-10-CM | POA: Insufficient documentation

## 2016-07-29 DIAGNOSIS — F17210 Nicotine dependence, cigarettes, uncomplicated: Secondary | ICD-10-CM | POA: Diagnosis not present

## 2016-07-29 MED ORDER — PREDNISONE 10 MG PO TABS: ORAL_TABLET | ORAL | 0 refills | Status: DC

## 2016-07-29 MED ORDER — DIPHENHYDRAMINE HCL 25 MG PO CAPS: 25.0000 mg | ORAL_CAPSULE | Freq: Four times a day (QID) | ORAL | 0 refills | Status: DC | PRN

## 2016-07-29 NOTE — ED Provider Notes (Signed)
[facility] DEPT Provider Note   CSN: [id number] Arrival date & time: 07/29/16  7494     History   Chief Complaint Chief Complaint  Patient presents with  . Urticaria    HPI Margaret Thompson is a 83 y.o. female presenting with a pruritic rash on her arms and trunk which started 2 evenings ago after she pulled some wisteria sucker vines out of several bushes at her home.  She denies exposure to poison ivy or oak as she knows what this looks like and denies any soaps, lotions, detergents.  She denies sob, mouth, tongue or throat swelling or tightness.  She has used rubbing alcohol on a cotton swab and applied neosporin without relief.  She denies migrating rash.  The history is provided by the patient.    Past Medical History:  Diagnosis Date  . Acute MI ([place]) 2003  . Adenocarcinoma of left breast ([place]) 11/23/2012   Stage II (T1 C. N1 M0) adenocarcinoma of the left breast with surgery on 03/08/2001. She had a 1.5 cm cancer with 1/6 positive sentinel nodes. Reexcision margins were clear. She did participate in NSABP B.-30 randomized to Adriamycin and Taxotere for 4 cycles followed by radiation therapy from 07/13/2001 to 08/27/2001. She then started Arimidex on 07/07/2001 took that until the end of June 2008. T  . Bilateral cataracts   . Breast cancer ([place])   . GERD (gastroesophageal reflux disease)   . History of gout   . Hypercholesteremia   . Hypertension   . Osteoporosis     Patient Active Problem List   Diagnosis Date Noted  . 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 Adenocarcinoma of left breast ([place]) 11/23/2012  . Bursitis, shoulder 05/25/2012    Past Surgical History:  Procedure Laterality Date  . ABDOMINAL HYSTERECTOMY    . BREAST SURGERY    . CATARACT EXTRACTION W/PHACO Left 09/04/2014   Procedure: CATARACT EXTRACTION PHACO AND INTRAOCULAR LENS PLACEMENT (IOC);  Surgeon: Tonny Branch, MD;  Location: [facility];  Service: Ophthalmology;  Laterality: Left;   CDE:9.84  . CATARACT EXTRACTION W/PHACO Right 11/09/2014   Procedure: CATARACT EXTRACTION PHACO AND INTRAOCULAR LENS PLACEMENT (IOC);  Surgeon: Tonny Branch, MD;  Location: [facility];  Service: Ophthalmology;  Laterality: Right;  CDE:8.40  . CORONARY ANGIOPLASTY     RCA stent  . LEFT HEART CATHETERIZATION WITH CORONARY ANGIOGRAM N/A 12/21/2012   Procedure: LEFT HEART CATHETERIZATION WITH CORONARY ANGIOGRAM;  Surgeon: Clent Demark, MD;  Location: [facility];  Service: Cardiovascular;  Laterality: N/A;  . MASTECTOMY, PARTIAL Left   . PERCUTANEOUS STENT INTERVENTION     RCA  . PORT-A-CATH REMOVAL    . PORTACATH PLACEMENT      OB History    No data available       Home Medications    Prior to Admission medications   Medication Sig Start Date End Date Taking? Authorizing Provider  aspirin EC 81 MG EC tablet Take 1 tablet (81 mg total) by mouth daily. 12/24/12   Charolette Forward, MD  atorvastatin (LIPITOR) 80 MG tablet Take 80 mg by mouth every evening.     [provider]  CARTIA XT 180 MG 24 hr capsule Take 180 mg by mouth daily. 05/02/13   [provider]  diphenhydrAMINE (BENADRYL) 25 mg capsule Take 1 capsule (25 mg total) by mouth every 6 (six) hours as needed. 07/29/16   Evalee Jefferson, PA-C  fluticasone (FLONASE) 50 MCG/ACT nasal spray Place 1 spray into both  nostrils daily. 02/08/14   Orpah Greek, MD  levofloxacin (LEVAQUIN) 750 MG tablet Take 1 tablet (750 mg total) by mouth daily. X 7 days 06/02/16   Mesner, Corene Cornea, MD  nitroGLYCERIN (NITROSTAT) 0.4 MG SL tablet Place 1 tablet (0.4 mg total) under the tongue every 5 (five) minutes x 3 doses as needed for chest pain. Patient not taking: Reported on 11/27/2015 12/24/12   Charolette Forward, MD  predniSONE (DELTASONE) 10 MG tablet Take 6 tablets day one, 5 tablets day two, 4 tablets day three, 3 tablets day four, 2 tablets day five, then 1 tablet day six 07/29/16   Evalee Jefferson, PA-C    Family History Family History    Problem Relation Age of Onset  . Stroke Mother     Social History Social History  Substance Use Topics  . Smoking status: Current Every Day Smoker    Packs/day: 0.50    Years: 50.00    Types: Cigarettes  . Smokeless tobacco: Never Used  . Alcohol use No     Allergies   Azithromycin   Review of Systems Review of Systems  Constitutional: Negative for chills and fever.  Respiratory: Negative for shortness of breath and wheezing.   Skin: Positive for rash.  Neurological: Negative for numbness.     Physical Exam Updated Vital Signs BP (!) 165/91 (BP Location: Left Arm)   Pulse (!) 108   Temp 98.1 F (36.7 C) (Oral)   Resp 18   Ht 5\' 2"  (1.575 m)   Wt 55.3 kg (122 lb)   SpO2 95%   BMI 22.31 kg/m   Physical Exam  Constitutional: She appears well-developed and well-nourished. No distress.  HENT:  Head: Normocephalic.  Nose: Nose normal.  Mouth/Throat: Oropharynx is clear and moist and mucous membranes are normal. No trismus in the jaw. No uvula swelling.  Neck: Neck supple.  Cardiovascular: Normal rate.   Pulmonary/Chest: Effort normal. She has no wheezes.  No stridor  Musculoskeletal: Normal range of motion. She exhibits no edema.  Skin: Rash noted. Rash is macular and urticarial.  Diffuse areas of erythema bilateral arms, chest, along waistline and back.  Several small satellite areas that appear more hive like.  No vesicles, rash is dry.      ED Treatments / Results  Labs (all labs ordered are listed, but only abnormal results are displayed) Labs Reviewed - No data to display  EKG  EKG Interpretation None       Radiology No results found.  Procedures Procedures (including critical care time)  Medications Ordered in ED Medications - No data to display   Initial Impression / Assessment and Plan / ED Course  I have reviewed the triage vital signs and the nursing notes.  Pertinent labs & imaging results that were available during my care of  the patient were reviewed by me and considered in my medical decision making (see chart for details).     Pt also seen by Dr. Laverta Baltimore during todays visit.  No resp distress, stridor, wheezing, no signs of anaphylaxis.  Final Clinical Impressions(s) / ED Diagnoses   Final diagnoses:  Irritant contact dermatitis due to plants, except food    New Prescriptions New Prescriptions   DIPHENHYDRAMINE (BENADRYL) 25 MG CAPSULE    Take 1 capsule (25 mg total) by mouth every 6 (six) hours as needed.   PREDNISONE (DELTASONE) 10 MG TABLET    Take 6 tablets day one, 5 tablets day two, 4 tablets day three, 3 tablets  day four, 2 tablets day five, then 1 tablet day six     Evalee Jefferson, Hershal Coria 07/29/16 0569    Margette Fast, MD 07/29/16 [phone]

## 2016-07-29 NOTE — ED Triage Notes (Signed)
Patient complaining of rash x 2 days from unknown cause. Patient has hives noted to bilateral arms, torso, and back.

## 2016-07-29 NOTE — Discharge Instructions (Signed)
Take your entire course of prednisone, following the label instructions to taper this medicine.  You may take each days dose at once.  I also recommend [place] anti itch cream (or the stores generic form of it) to help with the particularly itchy areas.  Cool compresses can also help with itching. Use caution with benadryl as this may make you sleepy.

## 2016-07-31 ENCOUNTER — Other Ambulatory Visit (HOSPITAL_COMMUNITY): Payer: Self-pay | Admitting: Internal Medicine

## 2016-07-31 ENCOUNTER — Other Ambulatory Visit: Payer: Self-pay

## 2016-07-31 DIAGNOSIS — R928 Other abnormal and inconclusive findings on diagnostic imaging of breast: Secondary | ICD-10-CM

## 2016-07-31 NOTE — Patient Outreach (Signed)
Outreach Patient after ED visit on 07/29/16.  Patient states she did reach out to Doctor prior to going to ED.  Doctors office advised her to go to ED.  Patient has number to contact Doctor after hours and took [facility] 24 hour Nurse Advice line number.  Patient states she is doing better and is not interested in our services at this time.

## 2016-08-12 ENCOUNTER — Ambulatory Visit (HOSPITAL_COMMUNITY)
Admission: RE | Admit: 2016-08-12 | Discharge: 2016-08-12 | Disposition: A | Discharge status: Home / Self Care | Payer: Medicare Other | Source: Ambulatory Visit | Attending: Internal Medicine | Admitting: Internal Medicine

## 2016-08-12 ENCOUNTER — Other Ambulatory Visit (HOSPITAL_COMMUNITY): Payer: Self-pay | Admitting: Internal Medicine

## 2016-08-12 DIAGNOSIS — N6320 Unspecified lump in the left breast, unspecified quadrant: Secondary | ICD-10-CM

## 2016-08-12 DIAGNOSIS — N6489 Other specified disorders of breast: Secondary | ICD-10-CM | POA: Diagnosis not present

## 2016-08-12 DIAGNOSIS — R921 Mammographic calcification found on diagnostic imaging of breast: Secondary | ICD-10-CM | POA: Diagnosis not present

## 2016-08-12 DIAGNOSIS — R928 Other abnormal and inconclusive findings on diagnostic imaging of breast: Secondary | ICD-10-CM | POA: Diagnosis not present

## 2016-08-12 DIAGNOSIS — N6324 Unspecified lump in the left breast, lower inner quadrant: Secondary | ICD-10-CM | POA: Diagnosis not present

## 2016-08-19 ENCOUNTER — Other Ambulatory Visit (HOSPITAL_COMMUNITY): Payer: Self-pay | Admitting: Internal Medicine

## 2016-08-19 ENCOUNTER — Ambulatory Visit (HOSPITAL_COMMUNITY)
Admission: RE | Admit: 2016-08-19 | Discharge: 2016-08-19 | Disposition: A | Discharge status: Home / Self Care | Payer: Medicare Other | Source: Ambulatory Visit | Attending: Internal Medicine | Admitting: Internal Medicine

## 2016-08-19 DIAGNOSIS — R928 Other abnormal and inconclusive findings on diagnostic imaging of breast: Secondary | ICD-10-CM

## 2016-08-19 DIAGNOSIS — R921 Mammographic calcification found on diagnostic imaging of breast: Secondary | ICD-10-CM

## 2016-08-19 DIAGNOSIS — N6320 Unspecified lump in the left breast, unspecified quadrant: Secondary | ICD-10-CM

## 2016-08-19 DIAGNOSIS — D0512 Intraductal carcinoma in situ of left breast: Secondary | ICD-10-CM | POA: Insufficient documentation

## 2016-08-19 DIAGNOSIS — N6324 Unspecified lump in the left breast, lower inner quadrant: Secondary | ICD-10-CM | POA: Diagnosis not present

## 2016-08-19 DIAGNOSIS — C50312 Malignant neoplasm of lower-inner quadrant of left female breast: Secondary | ICD-10-CM | POA: Diagnosis not present

## 2016-08-19 MED ORDER — LIDOCAINE HCL (PF) 1 % IJ SOLN
INTRAMUSCULAR | Status: AC
  Filled 2016-08-19: qty 5

## 2016-09-09 ENCOUNTER — Ambulatory Visit (INDEPENDENT_AMBULATORY_CARE_PROVIDER_SITE_OTHER): Payer: Medicare Other | Admitting: General Surgery

## 2016-09-09 ENCOUNTER — Encounter: Payer: Self-pay | Admitting: General Surgery

## 2016-09-09 ENCOUNTER — Ambulatory Visit: Payer: Medicare Other | Admitting: General Surgery

## 2016-09-09 VITALS — BP 128/76 | HR 109 | Temp 98.0°F | Resp 18 | Ht 62.0 in | Wt 124.0 lb

## 2016-09-09 DIAGNOSIS — Z171 Estrogen receptor negative status [ER-]: Secondary | ICD-10-CM | POA: Diagnosis not present

## 2016-09-09 DIAGNOSIS — C50312 Malignant neoplasm of lower-inner quadrant of left female breast: Secondary | ICD-10-CM | POA: Diagnosis not present

## 2016-09-09 NOTE — Progress Notes (Signed)
Margaret Thompson; [id number]; [birth year]   HPI   Patient is an 83 year old white female, status post left partial mastectomy with sentinel lymph node biopsy in 2003, status post chemotherapy and radiation therapy who presents with an infiltrating ductal carcinoma of the left breast.  This was found on routine mammography.  Patient does not feel one.  She was seen 1 year ago by oncology and was noted to be cancer free.  She currently has no pain. Past Medical History:  Diagnosis Date  . Acute MI ([place]) 2003  . Adenocarcinoma of left breast ([place]) 11/23/2012   Stage II (T1 C. N1 M0) adenocarcinoma of the left breast with surgery on 03/08/2001. She had a 1.5 cm cancer with 1/6 positive sentinel nodes. Reexcision margins were clear. She did participate in NSABP B.-30 randomized to Adriamycin and Taxotere for 4 cycles followed by radiation therapy from 07/13/2001 to 08/27/2001. She then started Arimidex on 07/07/2001 took that until the end of June 2008. T  . Bilateral cataracts   . Breast cancer ([place])   . GERD (gastroesophageal reflux disease)   . History of gout   . Hypercholesteremia   . Hypertension   . Osteoporosis     Past Surgical History:  Procedure Laterality Date  . ABDOMINAL HYSTERECTOMY    . BREAST SURGERY    . CATARACT EXTRACTION W/PHACO Left 09/04/2014   Procedure: CATARACT EXTRACTION PHACO AND INTRAOCULAR LENS PLACEMENT (IOC);  Surgeon: Tonny Branch, MD;  Location: [facility];  Service: Ophthalmology;  Laterality: Left;  CDE:9.84  . CATARACT EXTRACTION W/PHACO Right 11/09/2014   Procedure: CATARACT EXTRACTION PHACO AND INTRAOCULAR LENS PLACEMENT (IOC);  Surgeon: Tonny Branch, MD;  Location: [facility];  Service: Ophthalmology;  Laterality: Right;  CDE:8.40  . CORONARY ANGIOPLASTY     RCA stent  . LEFT HEART CATHETERIZATION WITH CORONARY ANGIOGRAM N/A 12/21/2012   Procedure: LEFT HEART CATHETERIZATION WITH CORONARY ANGIOGRAM;  Surgeon: Clent Demark, MD;  Location: [facility] CATH LAB;  Service:  Cardiovascular;  Laterality: N/A;  . MASTECTOMY, PARTIAL Left   . PERCUTANEOUS STENT INTERVENTION     RCA  . PORT-A-CATH REMOVAL    . PORTACATH PLACEMENT      Family History  Problem Relation Age of Onset  . Stroke Mother     Current Outpatient Prescriptions on File Prior to Visit  Medication Sig Dispense Refill  . aspirin EC 81 MG EC tablet Take 1 tablet (81 mg total) by mouth daily. 30 tablet 3  . atorvastatin (LIPITOR) 80 MG tablet Take 80 mg by mouth every evening.     Marland Kitchen CARTIA XT 180 MG 24 hr capsule Take 180 mg by mouth daily.    . diphenhydrAMINE (BENADRYL) 25 mg capsule Take 1 capsule (25 mg total) by mouth every 6 (six) hours as needed. 30 capsule 0  . fluticasone (FLONASE) 50 MCG/ACT nasal spray Place 1 spray into both nostrils daily. 16 g 0  . levofloxacin (LEVAQUIN) 750 MG tablet Take 1 tablet (750 mg total) by mouth daily. X 7 days 7 tablet 0  . nitroGLYCERIN (NITROSTAT) 0.4 MG SL tablet Place 1 tablet (0.4 mg total) under the tongue every 5 (five) minutes x 3 doses as needed for chest pain. (Patient not taking: Reported on 11/27/2015) 25 tablet 12  . predniSONE (DELTASONE) 10 MG tablet Take 6 tablets day one, 5 tablets day two, 4 tablets day three, 3 tablets day four, 2 tablets day five, then 1 tablet day six 21 tablet 0  . [DISCONTINUED] Cetirizine  HCl (ZYRTEC ALLERGY) 10 MG CAPS Take 1 capsule (10 mg total) by mouth daily. (Patient not taking: Reported on 02/08/2014) 10 capsule 0   No current facility-administered medications on file prior to visit.     Allergies  Allergen Reactions  . Azithromycin Hives    History  Alcohol Use No    History  Smoking Status  . Current Every Day Smoker  . Packs/day: 0.50  . Years: 50.00  . Types: Cigarettes  Smokeless Tobacco  . Never Used    Review of Systems  Constitutional: Negative.   HENT: Negative.   Eyes: Negative.   Respiratory: Negative.   Cardiovascular: Negative.   Gastrointestinal: Positive for heartburn.   Genitourinary: Negative.   Musculoskeletal: Negative.   Skin: Negative.   Neurological: Negative.   Endo/Heme/Allergies: Negative.   Psychiatric/Behavioral: Negative.     Objective   Vitals:   09/09/16 1238  BP: 128/76  Pulse: (!) 109  Resp: 18  Temp: 98 F (36.7 C)    Physical Exam  Constitutional: She is oriented to person, place, and time and well-developed, well-nourished, and in no distress.  HENT:  Head: Normocephalic and atraumatic.  Cardiovascular: Normal rate, regular rhythm and normal heart sounds.  Exam reveals no friction rub.   No murmur heard. Pulmonary/Chest: Effort normal and breath sounds normal. No respiratory distress. She has no wheezes. She has no rales.  Neurological: She is alert and oriented to person, place, and time.  Skin: Skin is warm and dry.  Vitals reviewed. Breast:  No dominant mass, nipple discharge, dimpling in either breast.  Axillas negative for palpable nodes.  Mammogram and path reports reviewed.  Assessment  Recurrent left breast cancer Plan     As patient has recurrence of her left breast cancer, having received a partial mastectomy, sentinel lymph node biopsy, chemotherapy, and radiation therapy in the past, this recurrence needs to be treated with a simple mastectomy.  The patient states that she would prefer not to have any further surgery.  She would like to think about it.  I have referred her back to oncology for further discussion with the patient.  Follow-up is pending that consultation.

## 2016-09-09 NOTE — Patient Instructions (Signed)
Total or Modified Radical Mastectomy A total mastectomy and a modified radical mastectomy are types of surgery for breast cancer. If you are having a total mastectomy (simple mastectomy), your entire breast will be removed. If you are having a modified radical mastectomy, your breast and nipple will be removed along with the lymph nodes under your arm. You may also have some of the lining over the muscle tissues under your breast removed. Let your health care provider know about:  Any allergies you have.  All medicines you are taking, including vitamins, herbs, eye drops, creams, and over-the-counter medicines.  Previous problems you or members of your family have had with the use of anesthetics.  Any blood disorders you have.  Any surgeries you have had.  Any medical conditions you have. What are the risks? Generally, this is a safe procedure. However, problems may occur, including:  Pain.  Infection.  Bleeding.  Scar tissue.  Chest numbness on the side of the surgery.  Fluid buildup under the skin flaps where your breast was removed (seroma).  Sensation of throbbing or tingling.  Stress or sadness from losing your breast.  If you have the lymph nodes under your arm removed, you may have arm swelling, weakness, or numbness on the same side of your body as your surgery. What happens before the procedure?  Ask your health care provider about: ? Changing or stopping your regular medicines. This is especially important if you are taking diabetes medicines or blood thinners. ? Taking medicines such as aspirin and ibuprofen. These medicines can thin your blood. Do not take these medicines before your procedure if your health care provider instructs you not to.  Follow your health care provider's instructions about eating or drinking restrictions.  You may be checked for extra fluid around your lymph nodes (lymphedema).  Plan to have someone take you home after the  procedure. What happens during the procedure?  An IV tube will be inserted into one of your veins.  You will be given a medicine that makes you fall asleep (general anesthetic).  Your breast will be cleaned with a germ-killing solution (antiseptic).  A wide incision will be made around your nipple. The skin and nipple inside the incision will be removed along with all breast tissue.  If you are having a modified radical mastectomy: ? The lining over your chest muscles will be removed. ? The incision may be extended to reach the lymph nodes under your arm, or a second incision may be made. ? The lymph nodes will be removed.  You may have a drainage tube inserted into your incision to collect fluid that builds up after surgery. This tube is connected to a suction bulb.  Your incision or incisions will be closed with stitches (sutures).  A bandage (dressing) will be placed over your breast and under your arm. The procedure may vary among health care providers and hospitals. What happens after the procedure?  You will be moved to a recovery area.  Your blood pressure, heart rate, breathing rate, and blood oxygen level will be monitored often until the medicines you were given have worn off.  You will be given pain medicine as needed.  After a while, you will be taken to a hospital room.  You will be encouraged to get up and walk as soon as you can.  Your IV tube can be removed when you are able to eat and drink.  Your drain may be removed before you go home   from the hospital, or you may be sent home with your drain and suction bulb. This information is not intended to replace advice given to you by your health care provider. Make sure you discuss any questions you have with your health care provider. Document Released: 09/24/2000 Document Revised: 09/06/2015 Document Reviewed: 09/14/2013 Elsevier Interactive Patient Education  2018 Elsevier Inc.  

## 2016-09-29 ENCOUNTER — Encounter (HOSPITAL_COMMUNITY): Payer: Self-pay

## 2016-09-29 ENCOUNTER — Encounter (HOSPITAL_COMMUNITY): Payer: Medicare Other | Attending: Oncology | Admitting: Oncology

## 2016-09-29 VITALS — BP 114/66 | HR 93 | Temp 98.3°F | Resp 18 | Wt 125.4 lb

## 2016-09-29 DIAGNOSIS — Z853 Personal history of malignant neoplasm of breast: Secondary | ICD-10-CM

## 2016-09-29 DIAGNOSIS — M810 Age-related osteoporosis without current pathological fracture: Secondary | ICD-10-CM

## 2016-09-29 DIAGNOSIS — C50912 Malignant neoplasm of unspecified site of left female breast: Secondary | ICD-10-CM

## 2016-09-29 NOTE — Patient Instructions (Signed)
[facility] at [facility] Discharge Instructions  RECOMMENDATIONS MADE BY THE CONSULTANT AND ANY TEST RESULTS WILL BE SENT TO YOUR REFERRING PHYSICIAN.  You saw Dr. Zhou today.  Thank you for choosing [facility] at [facility] to provide your oncology and hematology care.  To afford each patient quality time with our provider, please arrive at least 15 minutes before your scheduled appointment time.    If you have a lab appointment with the Cancer Center please come in thru the  Main Entrance and check in at the main information desk  You need to re-schedule your appointment should you arrive 10 or more minutes late.  We strive to give you quality time with our providers, and arriving late affects you and other patients whose appointments are after yours.  Also, if you no show three or more times for appointments you may be dismissed from the clinic at the providers discretion.     Again, thank you for choosing [facility].  Our hope is that these requests will decrease the amount of time that you wait before being seen by our physicians.       _____________________________________________________________  Should you have questions after your visit to [facility], please contact our office at [phone] between the hours of 8:30 a.m. and 4:30 p.m.  Voicemails left after 4:30 p.m. will not be returned until the following business day.  For prescription refill requests, have your pharmacy contact our office.       Resources For Cancer Patients and their Caregivers ? American Cancer Society: Can assist with transportation, wigs, general needs, runs Look Good Feel Better.        1-888-227-6333 ? Cancer Care: Provides financial assistance, online support groups, medication/co-pay assistance.  1-800-813-HOPE (4673) ? [facility] Assists [place] cancer patients and their families through  emotional , educational and financial support.  336-427-4357 ? [place] DSS Where to apply for food stamps, Medicaid and utility assistance. 336-342-1394 ? [facility]: Transportation to medical appointments. 336-347-2287 ? Social Security Administration: May apply for disability if have a Stage IV cancer. 336-342-7796 1-800-772-1213 ? [place] Aging, Disability and Transit Services: Assists with nutrition, care and transit needs. 336-349-2343  Cancer Center Support Programs: @10RELATIVEDAYS@ > Cancer Support Group  2nd Tuesday of the month 1pm-2pm, Journey Room  > Creative Journey  3rd Tuesday of the month 1130am-1pm, Journey Room  > Look Good Feel Better  1st Wednesday of the month 10am-12 noon, Journey Room (Call American Cancer Society to register 1-800-395-5775)    

## 2016-09-29 NOTE — Progress Notes (Signed)
      Fusco, Lawrence, MD [address]  [place] [address]   CURRENT THERAPY: Surveillance per NCCN guidelines.  INTERVAL HISTORY: Margaret Thompson 83 y.o. female returns for followup of Stage IIA ER/PR+, HER2 neg (T1 C. N1 M0) adenocarcinoma of the left breast with surgery on 03/08/2001. She had a 1.5 cm cancer with 1/6 positive sentinel nodes. Reexcision margins were clear. She did participate in NSABP B.-30 randomized to Adriamycin and Taxotere for 4 cycles followed by radiation therapy from 07/13/2001 to 08/27/2001. She then started Arimidex on 07/07/2001 took that until the end of June 2008.   Patient presents today for continued follow-up and concern for new breast cancer. Patient underwent a screening bilateral mammogram on 07/28/2016 which demonstrated in the left breast a possible asymmetry with calcification. She then underwent a left diagnostic mammogram on 08/12/2016 targeted ultrasound which demonstrated a hypoechoic irregular mass with indistinct margins at 6:30 position 1 cm from the nipple measuring 1.2 x 1.8 x 0.8 cm. Ultrasound of the left axilla is negative for lymphadenopathy. Patient underwent biopsy of the left breast mass on 08/20/2016 which demonstrated ER negative, PR negative, HER-2 negative by FISH, invasive ductal carcinoma with DCIS. Patient did not palpate any breast masses herself. Otherwise she states she's been doing well she denies any chest pain, shortness breath, abdominal pain. Patient saw Dr. Jenkins for surgical consultation on 09/01/2016 and was recommended to have a simple mastectomy, however patient is hesitant to go ahead with surgery.  Review of Systems  Constitutional: Negative.  Negative for chills, fever and weight loss.  HENT: Negative.   Eyes: Negative.  Negative for double vision.  Respiratory: Negative.  Negative for cough, hemoptysis, sputum production and shortness of breath.   Cardiovascular: Negative.  Negative for chest pain.    Gastrointestinal: Negative.  Negative for constipation, diarrhea, nausea and vomiting.  Genitourinary: Negative.   Musculoskeletal: Negative.   Skin: Negative.   Neurological: Negative.  Negative for weakness.  Endo/Heme/Allergies: Negative.   Psychiatric/Behavioral: Negative.     Past Medical History:  Diagnosis Date  . Acute MI (HCC) 2003  . Adenocarcinoma of left breast (HCC) 11/23/2012   Stage II (T1 C. N1 M0) adenocarcinoma of the left breast with surgery on 03/08/2001. She had a 1.5 cm cancer with 1/6 positive sentinel nodes. Reexcision margins were clear. She did participate in NSABP B.-30 randomized to Adriamycin and Taxotere for 4 cycles followed by radiation therapy from 07/13/2001 to 08/27/2001. She then started Arimidex on 07/07/2001 took that until the end of June 2008. T  . Bilateral cataracts   . Breast cancer (HCC)   . GERD (gastroesophageal reflux disease)   . History of gout   . Hypercholesteremia   . Hypertension   . Osteoporosis     Past Surgical History:  Procedure Laterality Date  . ABDOMINAL HYSTERECTOMY    . BREAST SURGERY    . CATARACT EXTRACTION W/PHACO Left 09/04/2014   Procedure: CATARACT EXTRACTION PHACO AND INTRAOCULAR LENS PLACEMENT (IOC);  Surgeon: Kerry Hunt, MD;  Location: [facility];  Service: Ophthalmology;  Laterality: Left;  CDE:9.84  . CATARACT EXTRACTION W/PHACO Right 11/09/2014   Procedure: CATARACT EXTRACTION PHACO AND INTRAOCULAR LENS PLACEMENT (IOC);  Surgeon: Kerry Hunt, MD;  Location: [facility];  Service: Ophthalmology;  Laterality: Right;  CDE:8.40  . CORONARY ANGIOPLASTY     RCA stent  . LEFT HEART CATHETERIZATION WITH CORONARY ANGIOGRAM N/A 12/21/2012   Procedure: LEFT HEART CATHETERIZATION WITH CORONARY ANGIOGRAM;  Surgeon: Mohan N   Terrence Dupont, MD;  Location: [facility];  Service: Cardiovascular;  Laterality: N/A;  . MASTECTOMY, PARTIAL Left   . PERCUTANEOUS STENT INTERVENTION     RCA  . PORT-A-CATH REMOVAL    . PORTACATH PLACEMENT       Family History  Problem Relation Age of Onset  . Stroke Mother     Social History   Social History  . Marital status: Widowed    Spouse name: N/A  . Number of children: N/A  . Years of education: N/A   Social History Main Topics  . Smoking status: Current Every Day Smoker    Packs/day: 0.50    Years: 50.00    Types: Cigarettes  . Smokeless tobacco: Never Used  . Alcohol use No  . Drug use: No  . Sexual activity: Not Asked   Other Topics Concern  . None   Social History Narrative  . None     PHYSICAL EXAMINATION  ECOG PERFORMANCE STATUS: 1 - Symptomatic but completely ambulatory  Vitals:   09/29/16 1334  BP: 114/66  Pulse: 93  Resp: 18  Temp: 98.3 F (36.8 C)  SpO2: 99%    GENERAL:alert, no distress, well developed, comfortable, cooperative, smiling and thickened facial skin. SKIN: skin color, texture, turgor are normal, no rashes or significant lesions HEAD: Normocephalic, No masses, lesions, tenderness or abnormalities EYES: normal, EOMI, Conjunctiva are pink and non-injected EARS: External ears normal OROPHARYNX:lips, buccal mucosa, and tongue normal and mucous membranes are moist  NECK: supple, no adenopathy, trachea midline LYMPH:  no palpable lymphadenopathy, no hepatosplenomegaly BREAST:bilateral breast normal without mass, skin or nipple changes or axillary nodes.  LUNGS: clear to auscultation and percussion, decreased breath sounds HEART: regular rate & rhythm, no murmurs, no gallops, S1 normal, S2 normal and physiological split S2 ABDOMEN:abdomen soft, non-tender, obese, normal bowel sounds and no masses or organomegaly BACK: Back symmetric, no curvature., No CVA tenderness EXTREMITIES:less then 2 second capillary refill, no joint deformities, effusion, or inflammation, no edema, no skin discoloration, no cyanosis  NEURO: alert & oriented x 3 with fluent speech, no focal motor/sensory deficits, gait normal   LABORATORY DATA: CBC     Component Value Date/Time   WBC 8.3 06/02/2016 1834   RBC 4.26 06/02/2016 1834   HGB 13.4 06/02/2016 1834   HCT 40.7 06/02/2016 1834   PLT 283 06/02/2016 1834   MCV 95.5 06/02/2016 1834   MCH 31.5 06/02/2016 1834   MCHC 32.9 06/02/2016 1834   RDW 13.1 06/02/2016 1834   LYMPHSABS 4.1 (H) 06/02/2016 1834   MONOABS 0.5 06/02/2016 1834   EOSABS 0.5 06/02/2016 1834   BASOSABS 0.1 06/02/2016 1834      Chemistry      Component Value Date/Time   NA 140 06/02/2016 1834   K 3.6 06/02/2016 1834   CL 105 06/02/2016 1834   CO2 26 06/02/2016 1834   BUN 8 06/02/2016 1834   CREATININE 0.86 06/02/2016 1834      Component Value Date/Time   CALCIUM 9.3 06/02/2016 1834   ALKPHOS 69 02/20/2015 1234   AST 19 02/20/2015 1234   ALT 10 (L) 02/20/2015 1234   BILITOT 0.8 02/20/2015 1234        PENDING LABS:   RADIOGRAPHIC STUDIES:  No results found.   PATHOLOGY:  Patient: TAWNYA, PUJOL Collected: 08/19/2016 Client: [facility] Accession: [id number] Received: 08/20/2016 Dobrinka Dimitrova-Koutleva DOB: 24-Jul-1933 Age: 45 Gender: F Reported: 08/21/2016 [address] Patient Ph: [phone] MRN #: [id number] [place], [place]  [address] Visit #: [id number] Chart #: Phone: [phone] Fax: CC: REPORT OF SURGICAL PATHOLOGY ADDITIONAL INFORMATION: PROGNOSTIC INDICATORS Results: IMMUNOHISTOCHEMICAL AND MORPHOMETRIC ANALYSIS PERFORMED MANUALLY Estrogen Receptor: 0%, NEGATIVE Progesterone Receptor: 0%, NEGATIVE Proliferation Marker Ki67: 30% COMMENT: The negative hormone receptor study(ies) in this case has An internal positive control. REFERENCE RANGE ESTROGEN RECEPTOR NEGATIVE 0% POSITIVE =>1% REFERENCE RANGE PROGESTERONE RECEPTOR NEGATIVE 0% POSITIVE =>1% All controls stained appropriately JOHN PATRICK MD Pathologist, Electronic Signature ( Signed 08/26/2016) FLUORESCENCE IN-SITU HYBRIDIZATION Results: HER2 - NEGATIVE RATIO OF HER2/CEP17 SIGNALS 1.21 AVERAGE  HER2 COPY NUMBER PER CELL 2.55 1 of 3 FINAL for Tibbett, Myeasha P ([id number]) ADDITIONAL INFORMATION:(continued) Reference Range: NEGATIVE HER2/CEP17 Ratio <2.0 and average HER2 copy number <4.0 EQUIVOCAL HER2/CEP17 Ratio <2.0 and average HER2 copy number >=4.0 and <6.0 POSITIVE HER2/CEP17 Ratio >=2.0 or <2.0 and average HER2 copy number >=6.0 JULIA MANNY MD Pathologist, Electronic Signature ( Signed 08/22/2016) FINAL DIAGNOSIS Diagnosis Breast, left, needle core biopsy, 6:30 - INVASIVE DUCTAL CARCINOMA. - DUCTAL CARCINOMA IN SITU. - SEE COMMENT. Microscopic Comment The carcinoma appears at least grade 2. A breast prognostic profile will be performed and the results reported separately. The results were called to [facility] on 08/21/16. (JBK:gt, 08/21/16) JOSHUA KISH MD Pathologist, Electronic Signature   ASSESSMENT AND PLAN:  Stage IIA ER/PR+, HER2 neg (T1 C. N1 M0) adenocarcinoma of the left breast with surgery on 03/08/2001. She had a 1.5 cm cancer with 1/6 positive sentinel nodes. Reexcision margins were clear. She did participate in NSABP B.-30 randomized to Adriamycin and Taxotere for 4 cycles followed by radiation therapy from 07/13/2001 to 08/27/2001. She then started Arimidex on 07/07/2001 took that until the end of June 2008. She has been thus far without a recurrence.  Recent Mammogram in July 2018 demonstrated new 1.2 x 0.8x 0.8 cm biopsy proven to be ER/PR negative, HER2 negative IDC with DCIS.  PLAN: -I have reviewed her surgical path from her biopsy in detail with the patient. I believe this is a NEW breast cancer since her hormone profile is different from her previous cancer. This new malignancy is triple negative. I have discussed with her that triple negative breast cancers are aggressive. I told her that I agreed with simple mastectomy with SLN biopsy as planned by Dr. Jenkins, however I would discuss with Dr. Jenkins whether a lumpectomy is feasible if  she does not want a mastectomy. She will likely need adjuvant chemotherapy since her tumor is greater than 1 cm and triple negative. I will plan to give her TC for 4 cycles. Given that she is over 72 years of age, there is no benefit in adjuvant radiation.  -Patient states she will think about surgery. I will contact Dr. Jenkins to get her in with him again.  -RTC in 6 weeks for follow up.     All questions were answered. The patient knows to call the clinic with any problems, questions or concerns. We can certainly see the patient much sooner if necessary.   This note is electronically signed by: Louise Zhou, MD 09/29/2016 1:56 PM  

## 2016-10-06 DIAGNOSIS — J392 Other diseases of pharynx: Secondary | ICD-10-CM | POA: Diagnosis not present

## 2016-10-06 DIAGNOSIS — R432 Parageusia: Secondary | ICD-10-CM | POA: Diagnosis not present

## 2016-10-06 DIAGNOSIS — Z1389 Encounter for screening for other disorder: Secondary | ICD-10-CM | POA: Diagnosis not present

## 2016-10-06 DIAGNOSIS — Z6821 Body mass index (BMI) 21.0-21.9, adult: Secondary | ICD-10-CM | POA: Diagnosis not present

## 2016-10-06 DIAGNOSIS — R202 Paresthesia of skin: Secondary | ICD-10-CM | POA: Diagnosis not present

## 2016-10-06 DIAGNOSIS — J029 Acute pharyngitis, unspecified: Secondary | ICD-10-CM | POA: Diagnosis not present

## 2016-10-07 ENCOUNTER — Encounter: Payer: Self-pay | Admitting: General Surgery

## 2016-10-07 ENCOUNTER — Ambulatory Visit (INDEPENDENT_AMBULATORY_CARE_PROVIDER_SITE_OTHER): Payer: Medicare Other | Admitting: General Surgery

## 2016-10-07 VITALS — BP 123/68 | HR 113 | Temp 98.4°F | Resp 18 | Ht 62.0 in | Wt 126.0 lb

## 2016-10-07 DIAGNOSIS — C50312 Malignant neoplasm of lower-inner quadrant of left female breast: Secondary | ICD-10-CM | POA: Diagnosis not present

## 2016-10-07 DIAGNOSIS — Z1389 Encounter for screening for other disorder: Secondary | ICD-10-CM | POA: Diagnosis not present

## 2016-10-07 DIAGNOSIS — Z6821 Body mass index (BMI) 21.0-21.9, adult: Secondary | ICD-10-CM | POA: Diagnosis not present

## 2016-10-07 DIAGNOSIS — R202 Paresthesia of skin: Secondary | ICD-10-CM | POA: Diagnosis not present

## 2016-10-07 DIAGNOSIS — Z171 Estrogen receptor negative status [ER-]: Secondary | ICD-10-CM

## 2016-10-07 DIAGNOSIS — Z0000 Encounter for general adult medical examination without abnormal findings: Secondary | ICD-10-CM | POA: Diagnosis not present

## 2016-10-07 NOTE — Progress Notes (Signed)
Subjective:     Margaret Thompson  Patient has been seen by oncology. She has been told by oncology that surgery is recommended as this is a new breast cancer. Objective:    BP 123/68   Pulse (!) 113   Temp 98.4 F (36.9 C)   Resp 18   Ht 5\' 2"  (1.575 m)   Wt 126 lb (57.2 kg)   BMI 23.05 kg/m   General:  alert, cooperative and no distress       Assessment:    Left breast cancer, history of left breast cancer    Plan:   Patient is still resistant to any surgical therapy. She states she has some things to do prior to making any further decision. I did talk to her about her options including left simple mastectomy with sentinel lymph node biopsy versus left partial mastectomy after needle localization and sentinel lymph node biopsy. She states that she will call me with her decision should she decide to proceed with surgery.

## 2016-10-09 DIAGNOSIS — Z720 Tobacco use: Secondary | ICD-10-CM | POA: Diagnosis not present

## 2016-10-09 DIAGNOSIS — I252 Old myocardial infarction: Secondary | ICD-10-CM | POA: Diagnosis not present

## 2016-10-09 DIAGNOSIS — I2510 Atherosclerotic heart disease of native coronary artery without angina pectoris: Secondary | ICD-10-CM | POA: Diagnosis not present

## 2016-10-13 DIAGNOSIS — D510 Vitamin B12 deficiency anemia due to intrinsic factor deficiency: Secondary | ICD-10-CM | POA: Diagnosis not present

## 2016-11-10 ENCOUNTER — Encounter (HOSPITAL_COMMUNITY): Payer: Self-pay | Admitting: Oncology

## 2016-11-10 ENCOUNTER — Encounter (HOSPITAL_COMMUNITY): Payer: Medicare Other | Attending: Oncology | Admitting: Oncology

## 2016-11-10 VITALS — BP 132/71 | HR 98 | Temp 98.2°F | Resp 16 | Ht 62.0 in | Wt 127.0 lb

## 2016-11-10 DIAGNOSIS — Z853 Personal history of malignant neoplasm of breast: Secondary | ICD-10-CM

## 2016-11-10 DIAGNOSIS — Z923 Personal history of irradiation: Secondary | ICD-10-CM | POA: Diagnosis not present

## 2016-11-10 DIAGNOSIS — C50912 Malignant neoplasm of unspecified site of left female breast: Secondary | ICD-10-CM

## 2016-11-10 DIAGNOSIS — Z171 Estrogen receptor negative status [ER-]: Secondary | ICD-10-CM | POA: Diagnosis not present

## 2016-11-10 DIAGNOSIS — Z9221 Personal history of antineoplastic chemotherapy: Secondary | ICD-10-CM

## 2016-11-10 NOTE — Progress Notes (Signed)
Redmond School, MD [address] [place] [address]   CURRENT THERAPY: Surveillance per NCCN guidelines.  INTERVAL HISTORY: Margaret Thompson 83 y.o. female returns for followup of Stage IIA ER/PR+, HER2 neg (T1 C. N1 M0) adenocarcinoma of the left breast with surgery on 03/08/2001. She had a 1.5 cm cancer with 1/6 positive sentinel nodes. Reexcision margins were clear. She did participate in NSABP B.-30 randomized to Adriamycin and Taxotere for 4 cycles followed by radiation therapy from 07/13/2001 to 08/27/2001. She then started Arimidex on 07/07/2001 took that until the end of June 2008.   Patient presents today for continued follow-up and concern for new breast cancer. Patient underwent a screening bilateral mammogram on 07/28/2016 which demonstrated in the left breast a possible asymmetry with calcification. She then underwent a left diagnostic mammogram on 08/12/2016 targeted ultrasound which demonstrated a hypoechoic irregular mass with indistinct margins at 6:30 position 1 cm from the nipple measuring 1.2 x 1.8 x 0.8 cm. Ultrasound of the left axilla is negative for lymphadenopathy. Patient underwent biopsy of the left breast mass on 08/20/2016 which demonstrated ER negative, PR negative, HER-2 negative by FISH, invasive ductal carcinoma with DCIS. Patient did not palpate any breast masses herself. Otherwise she states she's been doing well she denies any chest pain, shortness breath, abdominal pain. Patient saw Dr. Arnoldo Morale for surgical consultation on 09/01/2016 and was recommended to have a simple mastectomy, however patient is hesitant to go ahead with surgery.  INTERVAL HISTORY: Patient presents today for continue follow-up.  Patient saw Dr. Arnoldo Morale again for surgical evaluation on 10/07/2016, at that time even after a repeat discussion regarding breast surgery the patient was still undecided.  Today patient states that she still has not contacted him again regarding  surgery because she is concerned about how her heart will do with the surgery and she also wants to make will so that her son gets the house, prior to her undergoing surgery.  She has no new complaints today.  Review of Systems  Constitutional: Negative.  Negative for chills, fever and weight loss.  HENT: Negative.   Eyes: Negative.  Negative for double vision.  Respiratory: Negative.  Negative for cough, hemoptysis, sputum production and shortness of breath.   Cardiovascular: Negative.  Negative for chest pain.  Gastrointestinal: Negative.  Negative for constipation, diarrhea, nausea and vomiting.  Genitourinary: Negative.   Musculoskeletal: Negative.   Skin: Negative.   Neurological: Negative.  Negative for weakness.  Endo/Heme/Allergies: Negative.   Psychiatric/Behavioral: Negative.     Past Medical History:  Diagnosis Date  . Acute MI ([facility]) 2003  . Adenocarcinoma of left breast ([facility]) 11/23/2012   Stage II (T1 C. N1 M0) adenocarcinoma of the left breast with surgery on 03/08/2001. She had a 1.5 cm cancer with 1/6 positive sentinel nodes. Reexcision margins were clear. She did participate in NSABP B.-30 randomized to Adriamycin and Taxotere for 4 cycles followed by radiation therapy from 07/13/2001 to 08/27/2001. She then started Arimidex on 07/07/2001 took that until the end of June 2008. T  . Bilateral cataracts   . Breast cancer ([facility])   . GERD (gastroesophageal reflux disease)   . History of gout   . Hypercholesteremia   . Hypertension   . Osteoporosis     Past Surgical History:  Procedure Laterality Date  . ABDOMINAL HYSTERECTOMY    . BREAST SURGERY    . CATARACT EXTRACTION W/PHACO Left 09/04/2014   Procedure: CATARACT EXTRACTION PHACO AND INTRAOCULAR  LENS PLACEMENT (IOC);  Surgeon: Tonny Branch, MD;  Location: [facility];  Service: Ophthalmology;  Laterality: Left;  CDE:9.84  . CATARACT EXTRACTION W/PHACO Right 11/09/2014   Procedure: CATARACT EXTRACTION PHACO AND INTRAOCULAR  LENS PLACEMENT (IOC);  Surgeon: Tonny Branch, MD;  Location: [facility];  Service: Ophthalmology;  Laterality: Right;  CDE:8.40  . CORONARY ANGIOPLASTY     RCA stent  . LEFT HEART CATHETERIZATION WITH CORONARY ANGIOGRAM N/A 12/21/2012   Procedure: LEFT HEART CATHETERIZATION WITH CORONARY ANGIOGRAM;  Surgeon: Clent Demark, MD;  Location: [facility];  Service: Cardiovascular;  Laterality: N/A;  . MASTECTOMY, PARTIAL Left   . PERCUTANEOUS STENT INTERVENTION     RCA  . PORT-A-CATH REMOVAL    . PORTACATH PLACEMENT      Family History  Problem Relation Age of Onset  . Stroke Mother     Social History   Social History  . Marital status: Widowed    Spouse name: N/A  . Number of children: N/A  . Years of education: N/A   Social History Main Topics  . Smoking status: Current Every Day Smoker    Packs/day: 0.50    Years: 50.00    Types: Cigarettes  . Smokeless tobacco: Never Used  . Alcohol use No  . Drug use: No  . Sexual activity: Not Asked   Other Topics Concern  . None   Social History Narrative  . None     PHYSICAL EXAMINATION  ECOG PERFORMANCE STATUS: 1 - Symptomatic but completely ambulatory  Vitals:   11/10/16 1352  BP: 132/71  Pulse: 98  Resp: 16  Temp: 98.2 F (36.8 C)  SpO2: 100%    GENERAL:alert, no distress, well developed, comfortable, cooperative, smiling and thickened facial skin. SKIN: skin color, texture, turgor are normal, no rashes or significant lesions HEAD: Normocephalic, No masses, lesions, tenderness or abnormalities EYES: normal, EOMI, Conjunctiva are pink and non-injected EARS: External ears normal OROPHARYNX:lips, buccal mucosa, and tongue normal and mucous membranes are moist  NECK: supple, no adenopathy, trachea midline LYMPH:  no palpable lymphadenopathy, no hepatosplenomegaly BREAST:bilateral breast normal without mass, skin or nipple changes or axillary nodes.  LUNGS: clear to auscultation and percussion, decreased breath  sounds HEART: regular rate & rhythm, no murmurs, no gallops, S1 normal, S2 normal and physiological split S2 ABDOMEN:abdomen soft, non-tender, obese, normal bowel sounds and no masses or organomegaly BACK: Back symmetric, no curvature., No CVA tenderness EXTREMITIES:less then 2 second capillary refill, no joint deformities, effusion, or inflammation, no edema, no skin discoloration, no cyanosis  NEURO: alert & oriented x 3 with fluent speech, no focal motor/sensory deficits, gait normal   LABORATORY DATA: CBC    Component Value Date/Time   WBC 8.3 06/02/2016 1834   RBC 4.26 06/02/2016 1834   HGB 13.4 06/02/2016 1834   HCT 40.7 06/02/2016 1834   PLT 283 06/02/2016 1834   MCV 95.5 06/02/2016 1834   MCH 31.5 06/02/2016 1834   MCHC 32.9 06/02/2016 1834   RDW 13.1 06/02/2016 1834   LYMPHSABS 4.1 (H) 06/02/2016 1834   MONOABS 0.5 06/02/2016 1834   EOSABS 0.5 06/02/2016 1834   BASOSABS 0.1 06/02/2016 1834      Chemistry      Component Value Date/Time   NA 140 06/02/2016 1834   K 3.6 06/02/2016 1834   CL 105 06/02/2016 1834   CO2 26 06/02/2016 1834   BUN 8 06/02/2016 1834   CREATININE 0.86 06/02/2016 1834      Component Value Date/Time  CALCIUM 9.3 06/02/2016 1834   ALKPHOS 69 02/20/2015 1234   AST 19 02/20/2015 1234   ALT 10 (L) 02/20/2015 1234   BILITOT 0.8 02/20/2015 1234        PENDING LABS:   RADIOGRAPHIC STUDIES:  No results found.   PATHOLOGY:  Patient: KHERINGTON, MERAZ Collected: 08/19/2016 Client: [facility] Accession: [id number] Received: 08/20/2016 Dobrinka Dimitrova-Koutleva DOB: 11-19-1933 Age: 17 Gender: F Reported: 08/21/2016 [address] Patient Ph: [phone] MRN #: [id number] [place], [place] [address] Visit #: [id number] Chart #: Phone: [phone] Fax: CC: REPORT OF SURGICAL PATHOLOGY ADDITIONAL INFORMATION: PROGNOSTIC INDICATORS Results: IMMUNOHISTOCHEMICAL AND MORPHOMETRIC ANALYSIS PERFORMED MANUALLY Estrogen Receptor:  0%, NEGATIVE Progesterone Receptor: 0%, NEGATIVE Proliferation Marker Ki67: 30% COMMENT: The negative hormone receptor study(ies) in this case has An internal positive control. REFERENCE RANGE ESTROGEN RECEPTOR NEGATIVE 0% POSITIVE =>1% REFERENCE RANGE PROGESTERONE RECEPTOR NEGATIVE 0% POSITIVE =>1% All controls stained appropriately Claudette Laws MD Pathologist, Electronic Signature ( Signed 08/26/2016) FLUORESCENCE IN-SITU HYBRIDIZATION Results: HER2 - NEGATIVE RATIO OF HER2/CEP17 SIGNALS 1.21 AVERAGE HER2 COPY NUMBER PER CELL 2.55 1 of 3 FINAL for KOULA, VENIER ([id number]) ADDITIONAL INFORMATION:(continued) Reference Range: NEGATIVE HER2/CEP17 Ratio <2.0 and average HER2 copy number <4.0 EQUIVOCAL HER2/CEP17 Ratio <2.0 and average HER2 copy number >=4.0 and <6.0 POSITIVE HER2/CEP17 Ratio >=2.0 or <2.0 and average HER2 copy number >=6.0 Vicente Males MD Pathologist, Electronic Signature ( Signed 08/22/2016) FINAL DIAGNOSIS Diagnosis Breast, left, needle core biopsy, 6:30 - INVASIVE DUCTAL CARCINOMA. - DUCTAL CARCINOMA IN SITU. - SEE COMMENT. Microscopic Comment The carcinoma appears at least grade 2. A breast prognostic profile will be performed and the results reported separately. The results were called to [facility] on 08/21/16. (JBK:gt, 08/21/16) Enid Cutter MD Pathologist, Electronic Signature   ASSESSMENT AND PLAN:  Stage IIA ER/PR+, HER2 neg (T1 C. N1 M0) adenocarcinoma of the left breast with surgery on 03/08/2001. She had a 1.5 cm cancer with 1/6 positive sentinel nodes. Reexcision margins were clear. She did participate in NSABP B.-30 randomized to Adriamycin and Taxotere for 4 cycles followed by radiation therapy from 07/13/2001 to 08/27/2001. She then started Arimidex on 07/07/2001 took that until the end of June 2008. She has been thus far without a recurrence.  Recent Mammogram in July 2018 demonstrated new 1.2 x 0.8x 0.8 cm biopsy proven to  be ER/PR negative, HER2 negative IDC with DCIS.  PLAN: -I have again discussed with her that triple negative breast cancers are aggressive. I told her that I agreed with simple mastectomy with SLN biopsy as planned by Dr. Arnoldo Morale. I have discussed with her that she cannot continue to not make a decision about what she wants to do about surgery since her mass will grow and right now we are planning to treat her with curative intent. Also discussed with her that she will likely need adjuvant chemotherapy since her tumor is greater than 1 cm and triple negative. I will plan to give her TC for 4 cycles. Given that she is over 65 years of age, there is no benefit in adjuvant radiation.  -After our visit today patient stated that she will get in contact with her cardiologist to get cardiac clearance and contact Dr. Arnoldo Morale again about arranging for surgery.   -RTC in 8 weeks for follow up.     All questions were answered. The patient knows to call the clinic with any problems, questions or concerns. We can certainly see the patient much sooner if necessary.  This note is electronically signed by: Twana First, MD 11/10/2016 2:52 PM

## 2016-11-13 DIAGNOSIS — D510 Vitamin B12 deficiency anemia due to intrinsic factor deficiency: Secondary | ICD-10-CM | POA: Diagnosis not present

## 2016-11-28 ENCOUNTER — Ambulatory Visit (HOSPITAL_COMMUNITY): Payer: Medicare Other

## 2016-12-02 ENCOUNTER — Encounter: Payer: Self-pay | Admitting: General Surgery

## 2016-12-02 ENCOUNTER — Ambulatory Visit: Payer: Medicare Other | Admitting: General Surgery

## 2016-12-02 VITALS — BP 130/74 | HR 103 | Temp 97.3°F | Resp 18 | Ht 62.0 in | Wt 127.0 lb

## 2016-12-02 DIAGNOSIS — C50312 Malignant neoplasm of lower-inner quadrant of left female breast: Secondary | ICD-10-CM | POA: Diagnosis not present

## 2016-12-02 DIAGNOSIS — Z171 Estrogen receptor negative status [ER-]: Secondary | ICD-10-CM

## 2016-12-02 NOTE — Progress Notes (Signed)
Subjective:     Margaret Thompson  Patient now presents for follow-up of left breast cancer.  She would like to proceed with mastectomy. Objective:    BP 130/74   Pulse (!) 103   Temp (!) 97.3 F (36.3 C)   Resp 18   Ht 5\' 2"  (1.575 m)   Wt 127 lb (57.6 kg)   BMI 23.23 kg/m   General:  alert, cooperative and no distress       Assessment:    Recurrent left breast cancer    Plan:   Will proceed with left modified radical mastectomy on 01/16/2017.  The risks and benefits of the procedure including bleeding, infection, left arm swelling, and pain were fully explained to the patient, who gave informed consent.

## 2016-12-02 NOTE — Patient Instructions (Signed)
Total or Modified Radical Mastectomy A total mastectomy and a modified radical mastectomy are types of surgery for breast cancer. If you are having a total mastectomy (simple mastectomy), your entire breast will be removed. If you are having a modified radical mastectomy, your breast and nipple will be removed along with the lymph nodes under your arm. You may also have some of the lining over the muscle tissues under your breast removed. Let your health care provider know about:  Any allergies you have.  All medicines you are taking, including vitamins, herbs, eye drops, creams, and over-the-counter medicines.  Previous problems you or members of your family have had with the use of anesthetics.  Any blood disorders you have.  Any surgeries you have had.  Any medical conditions you have. What are the risks? Generally, this is a safe procedure. However, problems may occur, including:  Pain.  Infection.  Bleeding.  Scar tissue.  Chest numbness on the side of the surgery.  Fluid buildup under the skin flaps where your breast was removed (seroma).  Sensation of throbbing or tingling.  Stress or sadness from losing your breast.  If you have the lymph nodes under your arm removed, you may have arm swelling, weakness, or numbness on the same side of your body as your surgery. What happens before the procedure?  Ask your health care provider about: ? Changing or stopping your regular medicines. This is especially important if you are taking diabetes medicines or blood thinners. ? Taking medicines such as aspirin and ibuprofen. These medicines can thin your blood. Do not take these medicines before your procedure if your health care provider instructs you not to.  Follow your health care provider's instructions about eating or drinking restrictions.  You may be checked for extra fluid around your lymph nodes (lymphedema).  Plan to have someone take you home after the  procedure. What happens during the procedure?  An IV tube will be inserted into one of your veins.  You will be given a medicine that makes you fall asleep (general anesthetic).  Your breast will be cleaned with a germ-killing solution (antiseptic).  A wide incision will be made around your nipple. The skin and nipple inside the incision will be removed along with all breast tissue.  If you are having a modified radical mastectomy: ? The lining over your chest muscles will be removed. ? The incision may be extended to reach the lymph nodes under your arm, or a second incision may be made. ? The lymph nodes will be removed.  You may have a drainage tube inserted into your incision to collect fluid that builds up after surgery. This tube is connected to a suction bulb.  Your incision or incisions will be closed with stitches (sutures).  A bandage (dressing) will be placed over your breast and under your arm. The procedure may vary among health care providers and hospitals. What happens after the procedure?  You will be moved to a recovery area.  Your blood pressure, heart rate, breathing rate, and blood oxygen level will be monitored often until the medicines you were given have worn off.  You will be given pain medicine as needed.  After a while, you will be taken to a hospital room.  You will be encouraged to get up and walk as soon as you can.  Your IV tube can be removed when you are able to eat and drink.  Your drain may be removed before you go home   from the hospital, or you may be sent home with your drain and suction bulb. This information is not intended to replace advice given to you by your health care provider. Make sure you discuss any questions you have with your health care provider. Document Released: 09/24/2000 Document Revised: 09/06/2015 Document Reviewed: 09/14/2013 Elsevier Interactive Patient Education  2018 Elsevier Inc.  

## 2016-12-16 DIAGNOSIS — D510 Vitamin B12 deficiency anemia due to intrinsic factor deficiency: Secondary | ICD-10-CM | POA: Diagnosis not present

## 2016-12-26 NOTE — H&P (Signed)
Margaret Thompson; [id number]; [birth year]   HPI   Patient is an 83 year old white female, status post left partial mastectomy with sentinel lymph node biopsy in 2003, status post chemotherapy and radiation therapy who presents with an infiltrating ductal carcinoma of the left breast.  This was found on routine mammography.  Patient does not feel one.  She was seen 1 year ago by oncology and was noted to be cancer free.  She currently has no pain. Past Medical History:  Diagnosis Date  . Acute MI ([place]) 2003  . Adenocarcinoma of left breast ([place]) 11/23/2012   Stage II (T1 C. N1 M0) adenocarcinoma of the left breast with surgery on 03/08/2001. She had a 1.5 cm cancer with 1/6 positive sentinel nodes. Reexcision margins were clear. She did participate in NSABP B.-30 randomized to Adriamycin and Taxotere for 4 cycles followed by radiation therapy from 07/13/2001 to 08/27/2001. She then started Arimidex on 07/07/2001 took that until the end of June 2008. T  . Bilateral cataracts   . Breast cancer ([place])   . GERD (gastroesophageal reflux disease)   . History of gout   . Hypercholesteremia   . Hypertension   . Osteoporosis     Past Surgical History:  Procedure Laterality Date  . ABDOMINAL HYSTERECTOMY    . BREAST SURGERY    . CATARACT EXTRACTION W/PHACO Left 09/04/2014   Procedure: CATARACT EXTRACTION PHACO AND INTRAOCULAR LENS PLACEMENT (IOC);  Surgeon: Tonny Branch, MD;  Location: [facility];  Service: Ophthalmology;  Laterality: Left;  CDE:9.84  . CATARACT EXTRACTION W/PHACO Right 11/09/2014   Procedure: CATARACT EXTRACTION PHACO AND INTRAOCULAR LENS PLACEMENT (IOC);  Surgeon: Tonny Branch, MD;  Location: [facility];  Service: Ophthalmology;  Laterality: Right;  CDE:8.40  . CORONARY ANGIOPLASTY     RCA stent  . LEFT HEART CATHETERIZATION WITH CORONARY ANGIOGRAM N/A 12/21/2012   Procedure: LEFT HEART CATHETERIZATION WITH CORONARY ANGIOGRAM;  Surgeon: Clent Demark, MD;  Location: [facility];  Service:  Cardiovascular;  Laterality: N/A;  . MASTECTOMY, PARTIAL Left   . PERCUTANEOUS STENT INTERVENTION     RCA  . PORT-A-CATH REMOVAL    . PORTACATH PLACEMENT      Family History  Problem Relation Age of Onset  . Stroke Mother     Current Outpatient Prescriptions on File Prior to Visit  Medication Sig Dispense Refill  . aspirin EC 81 MG EC tablet Take 1 tablet (81 mg total) by mouth daily. 30 tablet 3  . atorvastatin (LIPITOR) 80 MG tablet Take 80 mg by mouth every evening.     Marland Kitchen CARTIA XT 180 MG 24 hr capsule Take 180 mg by mouth daily.    . diphenhydrAMINE (BENADRYL) 25 mg capsule Take 1 capsule (25 mg total) by mouth every 6 (six) hours as needed. 30 capsule 0  . fluticasone (FLONASE) 50 MCG/ACT nasal spray Place 1 spray into both nostrils daily. 16 g 0  . levofloxacin (LEVAQUIN) 750 MG tablet Take 1 tablet (750 mg total) by mouth daily. X 7 days 7 tablet 0  . nitroGLYCERIN (NITROSTAT) 0.4 MG SL tablet Place 1 tablet (0.4 mg total) under the tongue every 5 (five) minutes x 3 doses as needed for chest pain. (Patient not taking: Reported on 11/27/2015) 25 tablet 12  . predniSONE (DELTASONE) 10 MG tablet Take 6 tablets day one, 5 tablets day two, 4 tablets day three, 3 tablets day four, 2 tablets day five, then 1 tablet day six 21 tablet 0  . [DISCONTINUED] Cetirizine  HCl (ZYRTEC ALLERGY) 10 MG CAPS Take 1 capsule (10 mg total) by mouth daily. (Patient not taking: Reported on 02/08/2014) 10 capsule 0   No current facility-administered medications on file prior to visit.     Allergies  Allergen Reactions  . Azithromycin Hives    History  Alcohol Use No    History  Smoking Status  . Current Every Day Smoker  . Packs/day: 0.50  . Years: 50.00  . Types: Cigarettes  Smokeless Tobacco  . Never Used    Review of Systems  Constitutional: Negative.   HENT: Negative.   Eyes: Negative.   Respiratory: Negative.   Cardiovascular: Negative.   Gastrointestinal: Positive for heartburn.   Genitourinary: Negative.   Musculoskeletal: Negative.   Skin: Negative.   Neurological: Negative.   Endo/Heme/Allergies: Negative.   Psychiatric/Behavioral: Negative.     Objective   Vitals:   09/09/16 1238  BP: 128/76  Pulse: (!) 109  Resp: 18  Temp: 98 F (36.7 C)    Physical Exam  Constitutional: She is oriented to person, place, and time and well-developed, well-nourished, and in no distress.  HENT:  Head: Normocephalic and atraumatic.  Cardiovascular: Normal rate, regular rhythm and normal heart sounds.  Exam reveals no friction rub.   No murmur heard. Pulmonary/Chest: Effort normal and breath sounds normal. No respiratory distress. She has no wheezes. She has no rales.  Neurological: She is alert and oriented to person, place, and time.  Skin: Skin is warm and dry.  Vitals reviewed. Breast:  No dominant mass, nipple discharge, dimpling in either breast.  Axillas negative for palpable nodes.  Mammogram and path reports reviewed.  Assessment  Recurrent left breast cancer Plan     As patient has recurrence of her left breast cancer, having received a partial mastectomy, sentinel lymph node biopsy, chemotherapy, and radiation therapy in the past, this recurrence needs to be treated with a mastectomy.  Patient is scheduled for left modified radical mastectomy on 01/16/17.  Risks and benefits of procedure including bleeding, infection, cardiopulmonary difficulties, and the possibility of left arm swelling were fully explained to the patient, who gives informed consent.

## 2017-01-07 NOTE — Patient Instructions (Signed)
Margaret Thompson  01/07/2017     @PREFPERIOPPHARMACY @   Your procedure is scheduled on  01/16/2017   Report to [facility] at  21   A.M.  Call this number if you have problems the morning of surgery:  [phone]   Remember:  Do not eat food or drink liquids after midnight.  Take these medicines the morning of surgery with A SIP OF WATER  cartia XL, zantac.   Do not wear jewelry, make-up or nail polish.  Do not wear lotions, powders, or perfumes, or deodorant.  Do not shave 48 hours prior to surgery.  Men may shave face and neck.  Do not bring valuables to the hospital.  [facility] is not responsible for any belongings or valuables.  Contacts, dentures or bridgework may not be worn into surgery.  Leave your suitcase in the car.  After surgery it may be brought to your room.  For patients admitted to the hospital, discharge time will be determined by your treatment team.  Patients discharged the day of surgery will not be allowed to drive home.   Name and phone number of your driver:   family Special instructions:  None  Please read over the following fact sheets that you were given. Anesthesia Post-op Instructions and Care and Recovery After Surgery      Total or Modified Radical Mastectomy A total mastectomy and a modified radical mastectomy are types of surgery for breast cancer. If you are having a total mastectomy (simple mastectomy), your entire breast will be removed. If you are having a modified radical mastectomy, your breast and nipple will be removed along with the lymph nodes under your arm. You may also have some of the lining over the muscle tissues under your breast removed. Let your health care provider know about:  Any allergies you have.  All medicines you are taking, including vitamins, herbs, eye drops, creams, and over-the-counter medicines.  Previous problems you or members of your family have had with the use of anesthetics.  Any  blood disorders you have.  Any surgeries you have had.  Any medical conditions you have. What are the risks? Generally, this is a safe procedure. However, problems may occur, including:  Pain.  Infection.  Bleeding.  Scar tissue.  Chest numbness on the side of the surgery.  Fluid buildup under the skin flaps where your breast was removed (seroma).  Sensation of throbbing or tingling.  Stress or sadness from losing your breast.  If you have the lymph nodes under your arm removed, you may have arm swelling, weakness, or numbness on the same side of your body as your surgery. What happens before the procedure?  Ask your health care provider about: ? Changing or stopping your regular medicines. This is especially important if you are taking diabetes medicines or blood thinners. ? Taking medicines such as aspirin and ibuprofen. These medicines can thin your blood. Do not take these medicines before your procedure if your health care provider instructs you not to.  Follow your health care provider's instructions about eating or drinking restrictions.  You may be checked for extra fluid around your lymph nodes (lymphedema).  Plan to have someone take you home after the procedure. What happens during the procedure?  An IV tube will be inserted into one of your veins.  You will be given a medicine that makes you fall asleep (general anesthetic).  Your breast will be  cleaned with a germ-killing solution (antiseptic).  A wide incision will be made around your nipple. The skin and nipple inside the incision will be removed along with all breast tissue.  If you are having a modified radical mastectomy: ? The lining over your chest muscles will be removed. ? The incision may be extended to reach the lymph nodes under your arm, or a second incision may be made. ? The lymph nodes will be removed.  You may have a drainage tube inserted into your incision to collect fluid that builds  up after surgery. This tube is connected to a suction bulb.  Your incision or incisions will be closed with stitches (sutures).  A bandage (dressing) will be placed over your breast and under your arm. The procedure may vary among health care providers and hospitals. What happens after the procedure?  You will be moved to a recovery area.  Your blood pressure, heart rate, breathing rate, and blood oxygen level will be monitored often until the medicines you were given have worn off.  You will be given pain medicine as needed.  After a while, you will be taken to a hospital room.  You will be encouraged to get up and walk as soon as you can.  Your IV tube can be removed when you are able to eat and drink.  Your drain may be removed before you go home from the hospital, or you may be sent home with your drain and suction bulb. This information is not intended to replace advice given to you by your health care provider. Make sure you discuss any questions you have with your health care provider. Document Released: 09/24/2000 Document Revised: 09/06/2015 Document Reviewed: 09/14/2013 Elsevier Interactive Patient Education  2018 [place]. Total or Modified Radical Mastectomy, Care After Refer to this sheet in the next few weeks. These instructions provide you with information about caring for yourself after your procedure. Your health care provider may also give you more specific instructions. Your treatment has been planned according to current medical practices, but problems sometimes occur. Call your health care provider if you have any problems or questions after your procedure. What can I expect after the procedure? After your procedure, it is common to have:  Pain.  Numbness.  Stiffness in your arm or shoulder.  Feelings of stress, sadness, or depression.  If the lymph nodes under your arm were removed, you may have arm swelling, weakness, or numbness on the same side of  your body as your surgery. Follow these instructions at home: Incision care  There are many different ways to close and cover an incision, including stitches, skin glue, and adhesive strips. Follow your health care provider's instructions about: ? Incision care. ? Bandage (dressing) changes and removal. ? Incision closure removal.  Check your incision area every day for signs of infection. Watch for: ? Redness, swelling, or pain. ? Fluid, blood, or pus.  If you were sent home with a surgical drain in place, follow your health care provider's instructions for emptying it. Bathing  Do not take baths, swim, or use a hot tub until your health care provider approves.  Take sponge baths until your health care provider says that you can start showering or bathing. Activity  Return to your normal activities as directed by your health care provider.  Avoid strenuous exercise.  Be careful to avoid any activities that could cause an injury to your arm on the side of your surgery.  Do not  lift anything that is heavier than 10 lb (4.5 kg). Avoid lifting with the arm that is on the side of your surgery.  Do not carry heavy objects on your shoulder.  After your drain is removed, you should perform exercises to keep your arm from getting stiff and swollen. Talk with your health care provider about which exercises are safe for you. General instructions  Take medicines only as directed by your health care provider.  You may eat what you usually do.  Keep your arm elevated when at rest.  Do not wear tight jewelry on your arm, wrist, or fingers on the side of your surgery.  Get checked for extra fluid around your lymph nodes (lymphedema) as often as told by your health care provider.  If you had a modified radical mastectomy, always let your health care providers know that lymph nodes under your arm were removed. This is important information to share before you are involved in certain  procedures, such as giving blood or having your blood pressure taken. Contact a health care provider if:  You have a fever.  Your pain medicine is not working.  Your arm swelling, weakness, or numbness has not improved after a few weeks.  You have new swelling in your breast or arm.  You have redness, swelling, or pain in your incision area.  You have fluid, blood, or pus coming from your incision. Get help right away if:  You have very bad pain in your breast or arm.  You have chest pain.  You have difficulty breathing. This information is not intended to replace advice given to you by your health care provider. Make sure you discuss any questions you have with your health care provider. Document Released: 08/23/2003 Document Revised: 09/06/2015 Document Reviewed: 09/14/2013 Elsevier Interactive Patient Education  2018 [place] Anesthesia, Adult General anesthesia is the use of medicines to make a person "go to sleep" (be unconscious) for a medical procedure. General anesthesia is often recommended when a procedure:  Is long.  Requires you to be still or in an unusual position.  Is major and can cause you to lose blood.  Is impossible to do without general anesthesia.  The medicines used for general anesthesia are called general anesthetics. In addition to making you sleep, the medicines:  Prevent pain.  Control your blood pressure.  Relax your muscles.  Tell a health care provider about:  Any allergies you have.  All medicines you are taking, including vitamins, herbs, eye drops, creams, and over-the-counter medicines.  Any problems you or family members have had with anesthetic medicines.  Types of anesthetics you have had in the past.  Any bleeding disorders you have.  Any surgeries you have had.  Any medical conditions you have.  Any history of heart or lung conditions, such as heart failure, sleep apnea, or chronic obstructive pulmonary  disease (COPD).  Whether you are pregnant or may be pregnant.  Whether you use tobacco, alcohol, marijuana, or street drugs.  Any history of Armed forces logistics/support/administrative officer.  Any history of depression or anxiety. What are the risks? Generally, this is a safe procedure. However, problems may occur, including:  Allergic reaction to anesthetics.  Lung and heart problems.  Inhaling food or liquids from your stomach into your lungs (aspiration).  Injury to nerves.  Waking up during your procedure and being unable to move (rare).  Extreme agitation or a state of mental confusion (delirium) when you wake up from the anesthetic.  Air in the bloodstream, which can lead to stroke.  These problems are more likely to develop if you are having a major surgery or if you have an advanced medical condition. You can prevent some of these complications by answering all of your health care provider's questions thoroughly and by following all pre-procedure instructions. General anesthesia can cause side effects, including:  Nausea or vomiting  A sore throat from the breathing tube.  Feeling cold or shivery.  Feeling tired, washed out, or achy.  Sleepiness or drowsiness.  Confusion or agitation.  What happens before the procedure? Staying hydrated Follow instructions from your health care provider about hydration, which may include:  Up to 2 hours before the procedure - you may continue to drink clear liquids, such as water, clear fruit juice, black coffee, and plain tea.  Eating and drinking restrictions Follow instructions from your health care provider about eating and drinking, which may include:  8 hours before the procedure - stop eating heavy meals or foods such as meat, fried foods, or fatty foods.  6 hours before the procedure - stop eating light meals or foods, such as toast or cereal.  6 hours before the procedure - stop drinking milk or drinks that contain milk.  2 hours before the  procedure - stop drinking clear liquids.  Medicines  Ask your health care provider about: ? Changing or stopping your regular medicines. This is especially important if you are taking diabetes medicines or blood thinners. ? Taking medicines such as aspirin and ibuprofen. These medicines can thin your blood. Do not take these medicines before your procedure if your health care provider instructs you not to. ? Taking new dietary supplements or medicines. Do not take these during the week before your procedure unless your health care provider approves them.  If you are told to take a medicine or to continue taking a medicine on the day of the procedure, take the medicine with sips of water. General instructions   Ask if you will be going home the same day, the following day, or after a longer hospital stay. ? Plan to have someone take you home. ? Plan to have someone stay with you for the first 24 hours after you leave the hospital or clinic.  For 3-6 weeks before the procedure, try not to use any tobacco products, such as cigarettes, chewing tobacco, and e-cigarettes.  You may brush your teeth on the morning of the procedure, but make sure to spit out the toothpaste. What happens during the procedure?  You will be given anesthetics through a mask and through an IV tube in one of your veins.  You may receive medicine to help you relax (sedative).  As soon as you are asleep, a breathing tube may be used to help you breathe.  An anesthesia specialist will stay with you throughout the procedure. He or she will help keep you comfortable and safe by continuing to give you medicines and adjusting the amount of medicine that you get. He or she will also watch your blood pressure, pulse, and oxygen levels to make sure that the anesthetics do not cause any problems.  If a breathing tube was used to help you breathe, it will be removed before you wake up. The procedure may vary among health care  providers and hospitals. What happens after the procedure?  You will wake up, often slowly, after the procedure is complete, usually in a recovery area.  Your blood pressure, heart rate, breathing  rate, and blood oxygen level will be monitored until the medicines you were given have worn off.  You may be given medicine to help you calm down if you feel anxious or agitated.  If you will be going home the same day, your health care provider may check to make sure you can stand, drink, and urinate.  Your health care providers will treat your pain and side effects before you go home.  Do not drive for 24 hours if you received a sedative.  You may: ? Feel nauseous and vomit. ? Have a sore throat. ? Have mental slowness. ? Feel cold or shivery. ? Feel sleepy. ? Feel tired. ? Feel sore or achy, even in parts of your body where you did not have surgery. This information is not intended to replace advice given to you by your health care provider. Make sure you discuss any questions you have with your health care provider. Document Released: 04/08/2007 Document Revised: 06/12/2015 Document Reviewed: 12/14/2014 Elsevier Interactive Patient Education  2018 [place] Anesthesia, Adult, Care After These instructions provide you with information about caring for yourself after your procedure. Your health care provider may also give you more specific instructions. Your treatment has been planned according to current medical practices, but problems sometimes occur. Call your health care provider if you have any problems or questions after your procedure. What can I expect after the procedure? After the procedure, it is common to have:  Vomiting.  A sore throat.  Mental slowness.  It is common to feel:  Nauseous.  Cold or shivery.  Sleepy.  Tired.  Sore or achy, even in parts of your body where you did not have surgery.  Follow these instructions at home: For at least 24  hours after the procedure:  Do not: ? Participate in activities where you could fall or become injured. ? Drive. ? Use heavy machinery. ? Drink alcohol. ? Take sleeping pills or medicines that cause drowsiness. ? Make important decisions or sign legal documents. ? Take care of children on your own.  Rest. Eating and drinking  If you vomit, drink water, juice, or soup when you can drink without vomiting.  Drink enough fluid to keep your urine clear or pale yellow.  Make sure you have little or no nausea before eating solid foods.  Follow the diet recommended by your health care provider. General instructions  Have a responsible adult stay with you until you are awake and alert.  Return to your normal activities as told by your health care provider. Ask your health care provider what activities are safe for you.  Take over-the-counter and prescription medicines only as told by your health care provider.  If you smoke, do not smoke without supervision.  Keep all follow-up visits as told by your health care provider. This is important. Contact a health care provider if:  You continue to have nausea or vomiting at home, and medicines are not helpful.  You cannot drink fluids or start eating again.  You cannot urinate after 8-12 hours.  You develop a skin rash.  You have fever.  You have increasing redness at the site of your procedure. Get help right away if:  You have difficulty breathing.  You have chest pain.  You have unexpected bleeding.  You feel that you are having a life-threatening or urgent problem. This information is not intended to replace advice given to you by your health care provider. Make sure you discuss any questions  you have with your health care provider. Document Released: 04/07/2000 Document Revised: 06/04/2015 Document Reviewed: 12/14/2014 Elsevier Interactive Patient Education  [employer].

## 2017-01-08 DIAGNOSIS — I10 Essential (primary) hypertension: Secondary | ICD-10-CM | POA: Diagnosis not present

## 2017-01-08 DIAGNOSIS — Z720 Tobacco use: Secondary | ICD-10-CM | POA: Diagnosis not present

## 2017-01-08 DIAGNOSIS — I2510 Atherosclerotic heart disease of native coronary artery without angina pectoris: Secondary | ICD-10-CM | POA: Diagnosis not present

## 2017-01-09 DIAGNOSIS — Z6821 Body mass index (BMI) 21.0-21.9, adult: Secondary | ICD-10-CM | POA: Diagnosis not present

## 2017-01-09 DIAGNOSIS — E782 Mixed hyperlipidemia: Secondary | ICD-10-CM | POA: Diagnosis not present

## 2017-01-09 DIAGNOSIS — D0502 Lobular carcinoma in situ of left breast: Secondary | ICD-10-CM | POA: Diagnosis not present

## 2017-01-09 DIAGNOSIS — I2510 Atherosclerotic heart disease of native coronary artery without angina pectoris: Secondary | ICD-10-CM | POA: Diagnosis not present

## 2017-01-09 DIAGNOSIS — J449 Chronic obstructive pulmonary disease, unspecified: Secondary | ICD-10-CM | POA: Diagnosis not present

## 2017-01-09 DIAGNOSIS — Z1389 Encounter for screening for other disorder: Secondary | ICD-10-CM | POA: Diagnosis not present

## 2017-01-09 DIAGNOSIS — Z0001 Encounter for general adult medical examination with abnormal findings: Secondary | ICD-10-CM | POA: Diagnosis not present

## 2017-01-12 ENCOUNTER — Other Ambulatory Visit: Payer: Self-pay

## 2017-01-12 ENCOUNTER — Encounter (HOSPITAL_COMMUNITY): Payer: Self-pay

## 2017-01-12 ENCOUNTER — Other Ambulatory Visit (HOSPITAL_COMMUNITY): Payer: Self-pay | Admitting: Family Medicine

## 2017-01-12 ENCOUNTER — Encounter (HOSPITAL_COMMUNITY)
Admission: RE | Admit: 2017-01-12 | Discharge: 2017-01-12 | Disposition: A | Discharge status: Home / Self Care | Payer: Medicare Other | Source: Ambulatory Visit | Attending: General Surgery | Admitting: General Surgery

## 2017-01-12 ENCOUNTER — Ambulatory Visit (HOSPITAL_COMMUNITY)
Admission: RE | Admit: 2017-01-12 | Discharge: 2017-01-12 | Disposition: A | Discharge status: Home / Self Care | Payer: Medicare Other | Source: Ambulatory Visit | Attending: General Surgery | Admitting: General Surgery

## 2017-01-12 DIAGNOSIS — J449 Chronic obstructive pulmonary disease, unspecified: Secondary | ICD-10-CM | POA: Diagnosis not present

## 2017-01-12 DIAGNOSIS — Z01818 Encounter for other preprocedural examination: Secondary | ICD-10-CM | POA: Diagnosis not present

## 2017-01-12 DIAGNOSIS — C50912 Malignant neoplasm of unspecified site of left female breast: Secondary | ICD-10-CM | POA: Diagnosis not present

## 2017-01-12 DIAGNOSIS — Z01810 Encounter for preprocedural cardiovascular examination: Secondary | ICD-10-CM | POA: Diagnosis not present

## 2017-01-12 DIAGNOSIS — Z01812 Encounter for preprocedural laboratory examination: Secondary | ICD-10-CM | POA: Diagnosis not present

## 2017-01-12 DIAGNOSIS — E2839 Other primary ovarian failure: Secondary | ICD-10-CM

## 2017-01-12 HISTORY — DX: Anemia, unspecified: D64.9

## 2017-01-12 LAB — COMPREHENSIVE METABOLIC PANEL
ALBUMIN: 3.9 g/dL (ref 3.5–5.0)
ALK PHOS: 89 U/L (ref 38–126)
ALT: 13 U/L — ABNORMAL LOW (ref 14–54)
ANION GAP: 10 (ref 5–15)
AST: 15 U/L (ref 15–41)
BUN: 8 mg/dL (ref 6–20)
CHLORIDE: 104 mmol/L (ref 101–111)
CO2: 25 mmol/L (ref 22–32)
Calcium: 9.5 mg/dL (ref 8.9–10.3)
Creatinine, Ser: 0.87 mg/dL (ref 0.44–1.00)
GFR calc non Af Amer: >60 mL/min (ref >60)
GLUCOSE: 106 mg/dL — AB (ref 65–99)
Potassium: 3.7 mmol/L (ref 3.5–5.1)
SODIUM: 139 mmol/L (ref 135–145)
Total Bilirubin: 0.4 mg/dL (ref 0.3–1.2)
Total Protein: 7.3 g/dL (ref 6.5–8.1)

## 2017-01-12 LAB — CBC WITH DIFFERENTIAL/PLATELET
BASOS PCT: 1 %
Basophils Absolute: 0 10*3/uL (ref 0.0–0.1)
EOS PCT: 2 %
Eosinophils Absolute: 0.1 10*3/uL (ref 0.0–0.7)
HCT: 43.2 % (ref 36.0–46.0)
HEMOGLOBIN: 13.4 g/dL (ref 12.0–15.0)
Lymphocytes Relative: 42 %
Lymphs Abs: 2.6 10*3/uL (ref 0.7–4.0)
MCH: 29.9 pg (ref 26.0–34.0)
MCHC: 31 g/dL (ref 30.0–36.0)
MCV: 96.4 fL (ref 78.0–100.0)
MONOS PCT: 9 %
Monocytes Absolute: 0.6 10*3/uL (ref 0.1–1.0)
NEUTROS PCT: 46 %
Neutro Abs: 2.9 10*3/uL (ref 1.7–7.7)
PLATELETS: 294 10*3/uL (ref 150–400)
RBC: 4.48 MIL/uL (ref 3.87–5.11)
RDW: 12.9 % (ref 11.5–15.5)
WBC: 6.3 10*3/uL (ref 4.0–10.5)

## 2017-01-13 LAB — TYPE AND SCREEN
ABO/RH(D): O POS
ANTIBODY SCREEN: NEGATIVE

## 2017-01-16 ENCOUNTER — Encounter (HOSPITAL_COMMUNITY)
Admission: RE | Disposition: A | Discharge status: Home / Self Care | Payer: Self-pay | Source: Ambulatory Visit | Attending: General Surgery

## 2017-01-16 ENCOUNTER — Ambulatory Visit (HOSPITAL_COMMUNITY): Payer: Medicare PPO | Admitting: Anesthesiology

## 2017-01-16 ENCOUNTER — Encounter (HOSPITAL_COMMUNITY): Payer: Self-pay | Admitting: *Deleted

## 2017-01-16 ENCOUNTER — Observation Stay (HOSPITAL_COMMUNITY)
Admission: RE | Admit: 2017-01-16 | Discharge: 2017-01-17 | Disposition: A | Discharge status: Home / Self Care | Payer: Medicare PPO | Source: Ambulatory Visit | Attending: General Surgery | Admitting: General Surgery

## 2017-01-16 DIAGNOSIS — F17210 Nicotine dependence, cigarettes, uncomplicated: Secondary | ICD-10-CM | POA: Diagnosis not present

## 2017-01-16 DIAGNOSIS — I10 Essential (primary) hypertension: Secondary | ICD-10-CM | POA: Insufficient documentation

## 2017-01-16 DIAGNOSIS — C50312 Malignant neoplasm of lower-inner quadrant of left female breast: Secondary | ICD-10-CM

## 2017-01-16 DIAGNOSIS — D0512 Intraductal carcinoma in situ of left breast: Principal | ICD-10-CM | POA: Insufficient documentation

## 2017-01-16 DIAGNOSIS — I2510 Atherosclerotic heart disease of native coronary artery without angina pectoris: Secondary | ICD-10-CM | POA: Diagnosis not present

## 2017-01-16 DIAGNOSIS — K219 Gastro-esophageal reflux disease without esophagitis: Secondary | ICD-10-CM | POA: Diagnosis not present

## 2017-01-16 DIAGNOSIS — I252 Old myocardial infarction: Secondary | ICD-10-CM | POA: Diagnosis not present

## 2017-01-16 DIAGNOSIS — Z7982 Long term (current) use of aspirin: Secondary | ICD-10-CM | POA: Diagnosis not present

## 2017-01-16 DIAGNOSIS — E7800 Pure hypercholesterolemia, unspecified: Secondary | ICD-10-CM | POA: Diagnosis not present

## 2017-01-16 DIAGNOSIS — Z79899 Other long term (current) drug therapy: Secondary | ICD-10-CM | POA: Insufficient documentation

## 2017-01-16 DIAGNOSIS — Z955 Presence of coronary angioplasty implant and graft: Secondary | ICD-10-CM | POA: Insufficient documentation

## 2017-01-16 DIAGNOSIS — C50912 Malignant neoplasm of unspecified site of left female breast: Secondary | ICD-10-CM

## 2017-01-16 DIAGNOSIS — M109 Gout, unspecified: Secondary | ICD-10-CM | POA: Diagnosis not present

## 2017-01-16 DIAGNOSIS — Z923 Personal history of irradiation: Secondary | ICD-10-CM | POA: Diagnosis not present

## 2017-01-16 HISTORY — PX: MASTECTOMY MODIFIED RADICAL: SHX5962

## 2017-01-16 SURGERY — MASTECTOMY, MODIFIED RADICAL
Anesthesia: General | Site: Breast | Laterality: Left

## 2017-01-16 MED ORDER — NITROGLYCERIN 0.4 MG SL SUBL: 0.4000 mg | SUBLINGUAL_TABLET | SUBLINGUAL | Status: DC | PRN

## 2017-01-16 MED ORDER — HYDROCODONE-ACETAMINOPHEN 5-325 MG PO TABS: 1.0000 | ORAL_TABLET | ORAL | Status: DC | PRN

## 2017-01-16 MED ORDER — IPRATROPIUM-ALBUTEROL 0.5-2.5 (3) MG/3ML IN SOLN
3.0000 mL | Freq: Once | RESPIRATORY_TRACT | Status: AC
  Administered 2017-01-16: 3 mL via RESPIRATORY_TRACT

## 2017-01-16 MED ORDER — ACETAMINOPHEN 325 MG PO TABS: 650.0000 mg | ORAL_TABLET | Freq: Four times a day (QID) | ORAL | Status: DC | PRN

## 2017-01-16 MED ORDER — PANTOPRAZOLE SODIUM 40 MG PO TBEC
40.0000 mg | DELAYED_RELEASE_TABLET | Freq: Once | ORAL | Status: AC
  Administered 2017-01-16: 40 mg via ORAL

## 2017-01-16 MED ORDER — CEFAZOLIN SODIUM-DEXTROSE 2-4 GM/100ML-% IV SOLN
INTRAVENOUS | Status: AC
  Filled 2017-01-16: qty 100

## 2017-01-16 MED ORDER — ONDANSETRON HCL 4 MG/2ML IJ SOLN
INTRAMUSCULAR | Status: AC
  Filled 2017-01-16: qty 2

## 2017-01-16 MED ORDER — POVIDONE-IODINE 10 % OINT PACKET
TOPICAL_OINTMENT | CUTANEOUS | Status: DC | PRN
  Administered 2017-01-16: 1 via TOPICAL

## 2017-01-16 MED ORDER — DIPHENHYDRAMINE HCL 50 MG/ML IJ SOLN: 12.5000 mg | Freq: Four times a day (QID) | INTRAMUSCULAR | Status: DC | PRN

## 2017-01-16 MED ORDER — ENOXAPARIN SODIUM 30 MG/0.3ML ~~LOC~~ SOLN
30.0000 mg | SUBCUTANEOUS | Status: DC
  Administered 2017-01-17: 30 mg via SUBCUTANEOUS
  Filled 2017-01-16: qty 0.3

## 2017-01-16 MED ORDER — FENTANYL CITRATE (PF) 100 MCG/2ML IJ SOLN: 25.0000 ug | INTRAMUSCULAR | Status: DC | PRN

## 2017-01-16 MED ORDER — SODIUM CHLORIDE 0.9% FLUSH
INTRAVENOUS | Status: AC
  Filled 2017-01-16: qty 10

## 2017-01-16 MED ORDER — MIDAZOLAM HCL 2 MG/2ML IJ SOLN
INTRAMUSCULAR | Status: AC
  Filled 2017-01-16: qty 2

## 2017-01-16 MED ORDER — CEFAZOLIN SODIUM-DEXTROSE 2-4 GM/100ML-% IV SOLN
2.0000 g | INTRAVENOUS | Status: AC
  Administered 2017-01-16: 2 g via INTRAVENOUS

## 2017-01-16 MED ORDER — CHLORHEXIDINE GLUCONATE CLOTH 2 % EX PADS: 6.0000 | MEDICATED_PAD | Freq: Once | CUTANEOUS | Status: DC

## 2017-01-16 MED ORDER — LACTATED RINGERS IV SOLN
INTRAVENOUS | Status: DC
  Administered 2017-01-17: 06:00:00 via INTRAVENOUS

## 2017-01-16 MED ORDER — MORPHINE SULFATE (PF) 2 MG/ML IV SOLN: 2.0000 mg | INTRAVENOUS | Status: DC | PRN

## 2017-01-16 MED ORDER — BUPIVACAINE LIPOSOME 1.3 % IJ SUSP
INTRAMUSCULAR | Status: AC
  Filled 2017-01-16: qty 20

## 2017-01-16 MED ORDER — IPRATROPIUM-ALBUTEROL 0.5-2.5 (3) MG/3ML IN SOLN
RESPIRATORY_TRACT | Status: AC
  Filled 2017-01-16: qty 3

## 2017-01-16 MED ORDER — PROPOFOL 10 MG/ML IV BOLUS
INTRAVENOUS | Status: DC | PRN
  Administered 2017-01-16: 100 mg via INTRAVENOUS

## 2017-01-16 MED ORDER — ALBUTEROL SULFATE (2.5 MG/3ML) 0.083% IN NEBU
INHALATION_SOLUTION | RESPIRATORY_TRACT | Status: AC
  Filled 2017-01-16: qty 3

## 2017-01-16 MED ORDER — ONDANSETRON 4 MG PO TBDP: 4.0000 mg | ORAL_TABLET | Freq: Four times a day (QID) | ORAL | Status: DC | PRN

## 2017-01-16 MED ORDER — DEXAMETHASONE SODIUM PHOSPHATE 4 MG/ML IJ SOLN
INTRAMUSCULAR | Status: AC
  Filled 2017-01-16: qty 1

## 2017-01-16 MED ORDER — LACTATED RINGERS IV SOLN
INTRAVENOUS | Status: DC
  Administered 2017-01-16 (×2): via INTRAVENOUS

## 2017-01-16 MED ORDER — SUCCINYLCHOLINE CHLORIDE 20 MG/ML IJ SOLN
INTRAMUSCULAR | Status: DC | PRN
  Administered 2017-01-16: 120 mg via INTRAVENOUS

## 2017-01-16 MED ORDER — EPHEDRINE SULFATE 50 MG/ML IJ SOLN
INTRAMUSCULAR | Status: AC
  Filled 2017-01-16: qty 1

## 2017-01-16 MED ORDER — SUGAMMADEX SODIUM 200 MG/2ML IV SOLN
INTRAVENOUS | Status: DC | PRN
  Administered 2017-01-16: 150 mg via INTRAVENOUS

## 2017-01-16 MED ORDER — DEXAMETHASONE SODIUM PHOSPHATE 4 MG/ML IJ SOLN
4.0000 mg | Freq: Once | INTRAMUSCULAR | Status: AC
  Administered 2017-01-16: 4 mg via INTRAVENOUS

## 2017-01-16 MED ORDER — HYDROCODONE-ACETAMINOPHEN 5-325 MG PO TABS: 1.0000 | ORAL_TABLET | Freq: Four times a day (QID) | ORAL | 0 refills | Status: DC | PRN

## 2017-01-16 MED ORDER — ALBUTEROL SULFATE (2.5 MG/3ML) 0.083% IN NEBU: 2.5000 mg | INHALATION_SOLUTION | RESPIRATORY_TRACT | Status: DC | PRN

## 2017-01-16 MED ORDER — SODIUM CHLORIDE 0.9 % IJ SOLN
INTRAMUSCULAR | Status: AC
  Filled 2017-01-16: qty 10

## 2017-01-16 MED ORDER — SUCCINYLCHOLINE CHLORIDE 20 MG/ML IJ SOLN
INTRAMUSCULAR | Status: AC
  Filled 2017-01-16: qty 1

## 2017-01-16 MED ORDER — ROCURONIUM BROMIDE 50 MG/5ML IV SOLN
INTRAVENOUS | Status: AC
  Filled 2017-01-16: qty 1

## 2017-01-16 MED ORDER — ROCURONIUM BROMIDE 100 MG/10ML IV SOLN
INTRAVENOUS | Status: DC | PRN
  Administered 2017-01-16: 20 mg via INTRAVENOUS
  Administered 2017-01-16: 5 mg via INTRAVENOUS

## 2017-01-16 MED ORDER — ONDANSETRON HCL 4 MG/2ML IJ SOLN: 4.0000 mg | Freq: Four times a day (QID) | INTRAMUSCULAR | Status: DC | PRN

## 2017-01-16 MED ORDER — FENTANYL CITRATE (PF) 100 MCG/2ML IJ SOLN
INTRAMUSCULAR | Status: AC
  Filled 2017-01-16: qty 4

## 2017-01-16 MED ORDER — POVIDONE-IODINE 10 % EX OINT
TOPICAL_OINTMENT | CUTANEOUS | Status: AC
  Filled 2017-01-16: qty 1

## 2017-01-16 MED ORDER — MIDAZOLAM HCL 5 MG/5ML IJ SOLN
INTRAMUSCULAR | Status: DC | PRN
  Administered 2017-01-16: 1 mg via INTRAVENOUS

## 2017-01-16 MED ORDER — BUPIVACAINE LIPOSOME 1.3 % IJ SUSP
INTRAMUSCULAR | Status: DC | PRN
  Administered 2017-01-16: 20 mL

## 2017-01-16 MED ORDER — PANTOPRAZOLE SODIUM 40 MG PO TBEC
40.0000 mg | DELAYED_RELEASE_TABLET | Freq: Every day | ORAL | Status: DC
  Filled 2017-01-16 (×3): qty 1

## 2017-01-16 MED ORDER — FENTANYL CITRATE (PF) 100 MCG/2ML IJ SOLN
INTRAMUSCULAR | Status: DC | PRN
  Administered 2017-01-16 (×2): 50 ug via INTRAVENOUS

## 2017-01-16 MED ORDER — ACETAMINOPHEN 650 MG RE SUPP
650.0000 mg | Freq: Four times a day (QID) | RECTAL | Status: DC | PRN
  Filled 2017-01-16: qty 1

## 2017-01-16 MED ORDER — LIDOCAINE HCL 1 % IJ SOLN
INTRAMUSCULAR | Status: DC | PRN
  Administered 2017-01-16: 20 mg via INTRADERMAL

## 2017-01-16 MED ORDER — KETOROLAC TROMETHAMINE 30 MG/ML IJ SOLN
15.0000 mg | Freq: Once | INTRAMUSCULAR | Status: AC
  Administered 2017-01-16: 15 mg via INTRAVENOUS
  Filled 2017-01-16: qty 1

## 2017-01-16 MED ORDER — LIDOCAINE HCL (PF) 1 % IJ SOLN
INTRAMUSCULAR | Status: AC
  Filled 2017-01-16: qty 5

## 2017-01-16 MED ORDER — ONDANSETRON HCL 4 MG/2ML IJ SOLN
4.0000 mg | Freq: Once | INTRAMUSCULAR | Status: AC
  Administered 2017-01-16: 4 mg via INTRAVENOUS

## 2017-01-16 MED ORDER — MIDAZOLAM HCL 2 MG/2ML IJ SOLN
1.0000 mg | INTRAMUSCULAR | Status: DC
  Administered 2017-01-16: 2 mg via INTRAVENOUS

## 2017-01-16 MED ORDER — SIMETHICONE 80 MG PO CHEW
40.0000 mg | CHEWABLE_TABLET | Freq: Four times a day (QID) | ORAL | Status: DC | PRN
  Filled 2017-01-16: qty 1

## 2017-01-16 MED ORDER — DILTIAZEM HCL ER COATED BEADS 180 MG PO CP24
180.0000 mg | ORAL_CAPSULE | Freq: Every day | ORAL | Status: DC
  Administered 2017-01-17: 180 mg via ORAL
  Filled 2017-01-16: qty 1

## 2017-01-16 MED ORDER — 0.9 % SODIUM CHLORIDE (POUR BTL) OPTIME
TOPICAL | Status: DC | PRN
  Administered 2017-01-16: 1000 mL

## 2017-01-16 MED ORDER — PROPOFOL 10 MG/ML IV BOLUS
INTRAVENOUS | Status: AC
  Filled 2017-01-16: qty 40

## 2017-01-16 MED ORDER — DIPHENHYDRAMINE HCL 12.5 MG/5ML PO ELIX
12.5000 mg | ORAL_SOLUTION | Freq: Four times a day (QID) | ORAL | Status: DC | PRN
  Filled 2017-01-16: qty 5

## 2017-01-16 SURGICAL SUPPLY — 47 items
APPLIER CLIP 11 MED OPEN (CLIP)
APPLIER CLIP 9.375 SM OPEN (CLIP)
APR CLP MED 11 20 MLT OPN (CLIP)
APR CLP SM 9.3 20 MLT OPN (CLIP)
BAG HAMPER (MISCELLANEOUS) ×3 IMPLANT
BINDER BREAST LRG (GAUZE/BANDAGES/DRESSINGS) IMPLANT
BINDER BREAST XLRG (GAUZE/BANDAGES/DRESSINGS) IMPLANT
CHLORAPREP W/TINT 26ML (MISCELLANEOUS) ×3 IMPLANT
CLIP APPLIE 11 MED OPEN (CLIP) IMPLANT
CLIP APPLIE 9.375 SM OPEN (CLIP) IMPLANT
CLOTH BEACON ORANGE TIMEOUT ST (SAFETY) ×3 IMPLANT
COVER LIGHT HANDLE STERIS (MISCELLANEOUS) ×6 IMPLANT
DRAPE PROXIMA HALF (DRAPES) IMPLANT
ELECT REM PT RETURN 9FT ADLT (ELECTROSURGICAL) ×3
ELECTRODE REM PT RTRN 9FT ADLT (ELECTROSURGICAL) ×1 IMPLANT
EVACUATOR DRAINAGE 10X20 100CC (DRAIN) ×1 IMPLANT
EVACUATOR SILICONE 100CC (DRAIN) ×3
GAUZE SPONGE 4X4 12PLY STRL (GAUZE/BANDAGES/DRESSINGS) ×3 IMPLANT
GLOVE BIOGEL PI IND STRL 7.0 (GLOVE) ×1 IMPLANT
GLOVE BIOGEL PI INDICATOR 7.0 (GLOVE) ×2
GLOVE SURG SS PI 7.5 STRL IVOR (GLOVE) ×3 IMPLANT
GOWN STRL REUS W/TWL LRG LVL3 (GOWN DISPOSABLE) ×9 IMPLANT
INST SET MINOR GENERAL (KITS) ×3 IMPLANT
KIT ROOM TURNOVER APOR (KITS) ×3 IMPLANT
MANIFOLD NEPTUNE II (INSTRUMENTS) ×3 IMPLANT
NEEDLE HYPO 22GX1.5 SAFETY (NEEDLE) ×3 IMPLANT
NS IRRIG 1000ML POUR BTL (IV SOLUTION) ×3 IMPLANT
PACK MINOR (CUSTOM PROCEDURE TRAY) ×3 IMPLANT
PAD ABD 5X9 TENDERSORB (GAUZE/BANDAGES/DRESSINGS) ×3 IMPLANT
PAD ABD 7.5X8 STRL (GAUZE/BANDAGES/DRESSINGS) ×2 IMPLANT
PAD ARMBOARD 7.5X6 YLW CONV (MISCELLANEOUS) ×3 IMPLANT
SET BASIN LINEN APH (SET/KITS/TRAYS/PACK) ×3 IMPLANT
SPONGE DRAIN TRACH 4X4 STRL 2S (GAUZE/BANDAGES/DRESSINGS) ×3 IMPLANT
SPONGE INTESTINAL PEANUT (DISPOSABLE) IMPLANT
SPONGE LAP 18X18 X RAY DECT (DISPOSABLE) ×6 IMPLANT
STAPLER VISISTAT (STAPLE) ×5 IMPLANT
SUT ETHILON 3 0 FSL (SUTURE) ×3 IMPLANT
SUT SILK 2 0 (SUTURE) ×3
SUT SILK 2 0 SH (SUTURE) ×3 IMPLANT
SUT SILK 2-0 18XBRD TIE 12 (SUTURE) ×1 IMPLANT
SUT VIC AB 2-0 CT1 27 (SUTURE) ×15
SUT VIC AB 2-0 CT1 TAPERPNT 27 (SUTURE) ×3 IMPLANT
SUT VIC AB 3-0 SH 27 (SUTURE)
SUT VIC AB 3-0 SH 27X BRD (SUTURE) IMPLANT
SUT VICRYL AB 2 0 TIES (SUTURE) IMPLANT
SYR 20CC LL (SYRINGE) ×3 IMPLANT
TAPE PAPER 3X10 WHT MICROPORE (GAUZE/BANDAGES/DRESSINGS) ×2 IMPLANT

## 2017-01-16 NOTE — Progress Notes (Signed)
Patient has gotten up and voided and desires to go home if able. Patient denies pain, nausea, lunch tray ordered. Dr. Arnoldo Morale called to ask if patient would be OK to go home, states"would like the patient to stay for at least 24 hours to monitor JP output. Output from [place] since arrival to PACU 15 ml. Patient accepts MD's response.

## 2017-01-16 NOTE — Anesthesia Procedure Notes (Signed)
Procedure Name: Intubation Date/Time: 01/16/2017 9:15 AM Performed by: Charmaine Downs, CRNA Pre-anesthesia Checklist: Patient identified, Patient being monitored, Timeout performed, Emergency Drugs available and Suction available Patient Re-evaluated:Patient Re-evaluated prior to induction Oxygen Delivery Method: Circle System Utilized Preoxygenation: Pre-oxygenation with 100% oxygen Induction Type: IV induction, Rapid sequence and Cricoid Pressure applied Ventilation: Mask ventilation without difficulty Laryngoscope Size: Mac and 3 Grade View: Grade I Tube type: Oral Tube size: 7.0 mm Number of attempts: 1 Airway Equipment and Method: stylet Placement Confirmation: ETT inserted through vocal cords under direct vision,  positive ETCO2 and breath sounds checked- equal and bilateral Secured at: 22 cm Tube secured with: Tape Dental Injury: Teeth and Oropharynx as per pre-operative assessment

## 2017-01-16 NOTE — Interval H&P Note (Signed)
History and Physical Interval Note:  01/16/2017 8:29 AM  Margaret Thompson  has presented today for surgery, with the diagnosis of left breast cancer  The various methods of treatment have been discussed with the patient and family. After consideration of risks, benefits and other options for treatment, the patient has consented to  Procedure(s): MASTECTOMY MODIFIED RADICAL (Left) as a surgical intervention .  The patient's history has been reviewed, patient examined, no change in status, stable for surgery.  I have reviewed the patient's chart and labs.  Questions were answered to the patient's satisfaction.     Aviva Signs

## 2017-01-16 NOTE — Transfer of Care (Signed)
Immediate Anesthesia Transfer of Care Note  Patient: Margaret Thompson  Procedure(s) Performed: SIMPLE MASTECTOMY (Left Breast)  Patient Location: PACU  Anesthesia Type:General  Level of Consciousness: awake and patient cooperative  Airway & Oxygen Therapy: Patient Spontanous Breathing and Patient connected to face mask oxygen  Post-op Assessment: Report given to RN, Post -op Vital signs reviewed and stable and Patient moving all extremities  Post vital signs: Reviewed and stable  Last Vitals:  Vitals:   01/16/17 0855 01/16/17 0900  BP: 120/65 123/67  Resp: 17 17  Temp:    SpO2: 99% 100%    Last Pain:  Vitals:   01/16/17 0742  TempSrc: Oral         Complications: No apparent anesthesia complications

## 2017-01-16 NOTE — Progress Notes (Signed)
Patient awaiting bed assignment patient placed on hold, Pacu care completed.

## 2017-01-16 NOTE — Anesthesia Preprocedure Evaluation (Signed)
Anesthesia Evaluation  Patient identified by MRN, date of birth, ID band Patient awake    Reviewed: Allergy & Precautions, NPO status , Patient's Chart, lab work & pertinent test results  Airway Mallampati: I  TM Distance: >3 FB     Dental  (+) Edentulous Upper, Partial Lower   Pulmonary Current Smoker (am cough),    breath sounds clear to auscultation       Cardiovascular hypertension, Pt. on medications + CAD, + Past MI and + Cardiac Stents   Rhythm:Regular Rate:Normal     Neuro/Psych negative neurological ROS     GI/Hepatic GERD  Medicated and Controlled,  Endo/Other    Renal/GU      Musculoskeletal   Abdominal   Peds  Hematology   Anesthesia Other Findings   Reproductive/Obstetrics                             Anesthesia Physical Anesthesia Plan  ASA: III  Anesthesia Plan: General   Post-op Pain Management:    Induction: Intravenous  PONV Risk Score and Plan:   Airway Management Planned: Oral ETT  Additional Equipment:   Intra-op Plan:   Post-operative Plan: Extubation in OR  Informed Consent: I have reviewed the patients History and Physical, chart, labs and discussed the procedure including the risks, benefits and alternatives for the proposed anesthesia with the patient or authorized representative who has indicated his/her understanding and acceptance.     Plan Discussed with:   Anesthesia Plan Comments:         Anesthesia Quick Evaluation

## 2017-01-16 NOTE — Progress Notes (Signed)
Report given to V. Erskine Emery. Awaiting bed assignment. Patient and vital signs stable. Tolerated lunch tray well without nausea.

## 2017-01-16 NOTE — Progress Notes (Signed)
I spoke with daughter Silva Bandy to give her an update on her mothers status.  Patient resting comfortably and vital signs stable.

## 2017-01-16 NOTE — Anesthesia Postprocedure Evaluation (Signed)
Anesthesia Post Note  Patient: Margaret Thompson  Procedure(s) Performed: SIMPLE MASTECTOMY (Left Breast)  Patient location during evaluation: PACU Anesthesia Type: General Level of consciousness: awake and patient cooperative Pain management: pain level controlled Vital Signs Assessment: post-procedure vital signs reviewed and stable Respiratory status: spontaneous breathing, nonlabored ventilation and respiratory function stable Cardiovascular status: blood pressure returned to baseline Postop Assessment: no apparent nausea or vomiting Anesthetic complications: no     Last Vitals:  Vitals:   01/16/17 1030 01/16/17 1045  BP: (!) 152/62 (!) 143/68  Pulse: (!) 108 95  Resp: 15 13  Temp:    SpO2: 100% 100%    Last Pain:  Vitals:   01/16/17 0742  TempSrc: Oral                 Cherity Blickenstaff J

## 2017-01-16 NOTE — Op Note (Signed)
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  The patient now presents for left modified radical mastectomy.  The risks and benefits of the procedure including bleeding, infection, and nerve injury were fully explained to the patient, who gave informed consent.  Procedure note: Patient was placed in supine position.  After induction of general tracheal anesthesia, the left breast and axilla were prepped and draped using usual sterile technique with DuraPrep.  Surgical site confirmation was performed.  An elliptical incision was made around the left nipple.  A superior flap was formed to the clavicle and an inferior flap formed to the chest wall.  The left breast was then removed medial to lateral off the pectoralis major muscle using Bovie electrocautery.  A short suture was placed superiorly and a long suture placed laterally for orientation purposes.  The left breast was sent to pathology for further examination.  On palpation of the left axilla, no palpable lymph nodes were noted.  There was a limited amount of soft tissue in the left axilla and it was thought that given her diagnosis of DCIS and her age, there was no further indication to proceed with a left axillary dissection.  The wound was irrigated with normal saline.  A #10 flat Jackson-Pratt drain was placed along the flap and brought through a separate stab wound inferior to the incision line.  The drain was secured in place at the skin level using a 3-0 nylon interrupted suture.  Subcutaneous layer was  reapproximated using 2-0 Vicryl interrupted sutures.  The skin was closed using staples.  Exparel was instilled into the surrounding wound.  Betadine ointment and dry sterile dressing were applied.  All tape needle counts were correct at the end of the procedure.  Patient was extubated in the operating room and transferred to PACU in stable condition.  Complications: None  EBL: 25 cc  Specimen: Left breast  Drains: JP drain to left mastectomy flap

## 2017-01-16 NOTE — Progress Notes (Signed)
Transported patient to 2A 01 with no complications.  Report given to [facility] along with patient's prescription for pain medicine.  Patient and her vital signs are stable throughout my care.

## 2017-01-17 ENCOUNTER — Other Ambulatory Visit: Payer: Self-pay

## 2017-01-17 DIAGNOSIS — D0512 Intraductal carcinoma in situ of left breast: Secondary | ICD-10-CM | POA: Diagnosis not present

## 2017-01-17 LAB — CBC
HCT: 36.2 % (ref 36.0–46.0)
HEMOGLOBIN: 11.4 g/dL — AB (ref 12.0–15.0)
MCH: 30.3 pg (ref 26.0–34.0)
MCHC: 31.5 g/dL (ref 30.0–36.0)
MCV: 96.3 fL (ref 78.0–100.0)
Platelets: 264 10*3/uL (ref 150–400)
RBC: 3.76 MIL/uL — ABNORMAL LOW (ref 3.87–5.11)
RDW: 12.8 % (ref 11.5–15.5)
WBC: 12 10*3/uL — ABNORMAL HIGH (ref 4.0–10.5)

## 2017-01-17 LAB — BASIC METABOLIC PANEL
ANION GAP: 8 (ref 5–15)
BUN: 12 mg/dL (ref 6–20)
CHLORIDE: 108 mmol/L (ref 101–111)
CO2: 24 mmol/L (ref 22–32)
Calcium: 9 mg/dL (ref 8.9–10.3)
Creatinine, Ser: 0.8 mg/dL (ref 0.44–1.00)
Glucose, Bld: 129 mg/dL — ABNORMAL HIGH (ref 65–99)
Potassium: 3.9 mmol/L (ref 3.5–5.1)
SODIUM: 140 mmol/L (ref 135–145)

## 2017-01-17 NOTE — Discharge Instructions (Signed)
ABD/ or maxi pads to the incision daily. Drain care per RN instructions.  Total or Modified Radical Mastectomy, Care After Refer to this sheet in the next few weeks. These instructions provide you with information about caring for yourself after your procedure. Your health care provider may also give you more specific instructions. Your treatment has been planned according to current medical practices, but problems sometimes occur. Call your health care provider if you have any problems or questions after your procedure. What can I expect after the procedure? After your procedure, it is common to have:  Pain.  Numbness.  Stiffness in your arm or shoulder.  Feelings of stress, sadness, or depression.  If the lymph nodes under your arm were removed, you may have arm swelling, weakness, or numbness on the same side of your body as your surgery. Follow these instructions at home: Incision care  There are many different ways to close and cover an incision, including stitches, skin glue, and adhesive strips. Follow your health care provider's instructions about: ? Incision care. ? Bandage (dressing) changes and removal. ? Incision closure removal.  Check your incision area every day for signs of infection. Watch for: ? Redness, swelling, or pain. ? Fluid, blood, or pus.  If you were sent home with a surgical drain in place, follow your health care provider's instructions for emptying it. Bathing  Do not take baths, swim, or use a hot tub until your health care provider approves.  Take sponge baths until your health care provider says that you can start showering or bathing. Activity  Return to your normal activities as directed by your health care provider.  Avoid strenuous exercise.  Be careful to avoid any activities that could cause an injury to your arm on the side of your surgery.  Do not lift anything that is heavier than 10 lb (4.5 kg). Avoid lifting with the arm that is  on the side of your surgery.  Do not carry heavy objects on your shoulder.  After your drain is removed, you should perform exercises to keep your arm from getting stiff and swollen. Talk with your health care provider about which exercises are safe for you. General instructions  Take medicines only as directed by your health care provider.  You may eat what you usually do.  Keep your arm elevated when at rest.  Do not wear tight jewelry on your arm, wrist, or fingers on the side of your surgery.  Get checked for extra fluid around your lymph nodes (lymphedema) as often as told by your health care provider.  If you had a modified radical mastectomy, always let your health care providers know that lymph nodes under your arm were removed. This is important information to share before you are involved in certain procedures, such as giving blood or having your blood pressure taken. Contact a health care provider if:  You have a fever.  Your pain medicine is not working.  Your arm swelling, weakness, or numbness has not improved after a few weeks.  You have new swelling in your breast or arm.  You have redness, swelling, or pain in your incision area.  You have fluid, blood, or pus coming from your incision. Get help right away if:  You have very bad pain in your breast or arm.  You have chest pain.  You have difficulty breathing. This information is not intended to replace advice given to you by your health care provider. Make sure you discuss any  questions you have with your health care provider. Document Released: 08/23/2003 Document Revised: 09/06/2015 Document Reviewed: 09/14/2013 Elsevier Interactive Patient Education  2018 [place] Surgical drains are used to remove extra fluid that normally builds up in a surgical wound after surgery. A surgical drain helps to heal a surgical wound. Different kinds of surgical drains include:  Active  drains. These drains use suction to pull drainage away from the surgical wound. Drainage flows through a tube to a container outside of the body. It is important to keep the bulb or the drainage container flat (compressed) at all times, except while you empty it. Flattening the bulb or container creates suction. The two most common types of active drains are bulb drains and Hemovac drains.  Passive drains. These drains allow fluid to drain naturally, by gravity. Drainage flows through a tube to a bandage (dressing) or a container outside of the body. Passive drains do not need to be emptied. The most common type of passive drain is the Penrose drain.  A drain is placed during surgery. Immediately after surgery, drainage is usually bright red and a little thicker than water. The drainage may gradually turn yellow or pink and become thinner. It is likely that your health care provider will remove the drain when the drainage stops or when the amount decreases to 1-2 Tbsp (15-30 mL) during a 24-hour period. How to care for your surgical drain  Keep the skin around the drain dry and covered with a dressing at all times.  Check your drain area every day for signs of infection. Check for: ? More redness, swelling, or pain. ? Pus or a bad smell. ? Cloudy drainage. Follow instructions from your health care provider about how to take care of your drain and how to change your dressing. Change your dressing at least one time every day. Change it more often if needed to keep the dressing dry. Make sure you: 1. Gather your supplies, including: ? Tape. ? Germ-free cleaning solution (sterile saline). ? Split gauze drain sponge: 4 x 4 inches (10 x 10 cm). ? Gauze square: 4 x 4 inches (10 x 10 cm). 2. Wash your hands with soap and water before you change your dressing. If soap and water are not available, use hand sanitizer. 3. Remove the old dressing. Avoid using scissors to do that. 4. Use sterile saline to  clean your skin around the drain. 5. Place the tube through the slit in a drain sponge. Place the drain sponge so that it covers your wound. 6. Place the gauze square or another drain sponge on top of the drain sponge that is on the wound. Make sure the tube is between those layers. 7. Tape the dressing to your skin. 8. If you have an active bulb or Hemovac drain, tape the drainage tube to your skin 1-2 inches (2.5-5 cm) below the place where the tube enters your body. Taping keeps the tube from pulling on any stitches (sutures) that you have. 9. Wash your hands with soap and water. 10. Write down the color of your drainage and how often you change your dressing.  How to empty your active bulb or Hemovac drain 1. Make sure that you have a measuring cup that you can empty your drainage into. 2. Wash your hands with soap and water. If soap and water are not available, use hand sanitizer. 3. Gently move your fingers down the tube while squeezing very lightly. This is called  stripping the tube. This clears any drainage, clots, or tissue from the tube. ? Do not pull on the tube. ? You may need to strip the tube several times every day to keep the tube clear. 4. Open the bulb cap or the drain plug. Do not touch the inside of the cap or the bottom of the plug. 5. Empty all of the drainage into the measuring cup. 6. Compress the bulb or the container and replace the cap or the plug. To compress the bulb or the container, squeeze it firmly in the middle while you close the cap or plug the container. 7. Write down the amount of drainage that you have in each 24-hour period. If you have less than 2 Tbsp (30 mL) of drainage during 24 hours, contact your health care provider. 8. Flush the drainage down the toilet. 9. Wash your hands with soap and water. Contact a health care provider if:  You have more redness, swelling, or pain around your drain area.  The amount of drainage that you have is increasing  instead of decreasing.  You have pus or a bad smell coming from your drain area.  You have a fever.  You have drainage that is cloudy.  There is a sudden stop or a sudden decrease in the amount of drainage that you have.  Your tube falls out.  Your active draindoes not stay compressedafter you empty it. This information is not intended to replace advice given to you by your health care provider. Make sure you discuss any questions you have with your health care provider. Document Released: 12/28/1999 Document Revised: 06/07/2015 Document Reviewed: 07/19/2014 Elsevier Interactive Patient Education  2018 [employer].

## 2017-01-17 NOTE — Progress Notes (Signed)
Patient discharged home with personal belongings. IV removed and site intact. PAtient discharged with pain prescription as well.

## 2017-01-17 NOTE — Discharge Summary (Signed)
Physician Discharge Summary  Patient ID: PANAYIOTA LARKIN MRN: [id number] DOB/AGE: 05-04-1933 84 y.o.  Admit date: 01/16/2017 Discharge date: 01/17/2017  Admission Diagnoses: Breast Cancer   Discharge Diagnoses:  Active Problems:   Malignant neoplasm of lower-inner quadrant of left female breast [facility])   Breast cancer, left [facility])   Discharged Condition: good  Hospital Course: [name] is  84 yo with a recurrent breast cancer. She stayed overnight and has been doing well. She is up and ambulating and her pain is controlled. She has been taught to care for the drain. She has tolerated a diet.   Consults: None  Significant Diagnostic Studies: None  Treatments: surgery: simple mastectomy 01/16/2017   Discharge Exam: Blood pressure 125/68, pulse (!) 110, temperature 97.9 F (36.6 C), temperature source Oral, resp. rate 20, height 5' (1.524 m), weight 126 lb (57.2 kg), SpO2 90 %. General appearance: alert, cooperative and no distress Resp: normal work breathing Breasts: left breast incision c/d/i with staples, no erythema or drainage, JP with serosanginous output Extremities: extremities normal, atraumatic, no cyanosis or edema  Disposition: 01-Home or Self Care  Discharge Instructions    Call MD for:  difficulty breathing, headache or visual disturbances   Complete by:  As directed    Call MD for:  extreme fatigue   Complete by:  As directed    Call MD for:  hives   Complete by:  As directed    Call MD for:  persistant nausea and vomiting   Complete by:  As directed    Call MD for:  redness, tenderness, or signs of infection (pain, swelling, redness, odor or green/yellow discharge around incision site)   Complete by:  As directed    Call MD for:  severe uncontrolled pain   Complete by:  As directed    Call MD for:  temperature >100.4   Complete by:  As directed    Diet - low sodium heart healthy   Complete by:  As directed    Increase activity slowly   Complete by:  As directed      Allergies as of 01/17/2017      Reactions   Azithromycin Hives      Medication List    TAKE these medications   aspirin 81 MG EC tablet Take 1 tablet (81 mg total) by mouth daily.   atorvastatin 40 MG tablet Commonly known as:  LIPITOR   CARTIA XT 180 MG 24 hr capsule Generic drug:  diltiazem Take 180 mg by mouth daily.   HYDROcodone-acetaminophen 5-325 MG tablet Commonly known as:  NORCO Take 1 tablet by mouth every 6 (six) hours as needed for moderate pain.   nitroGLYCERIN 0.4 MG SL tablet Commonly known as:  NITROSTAT Place 1 tablet (0.4 mg total) under the tongue every 5 (five) minutes x 3 doses as needed for chest pain. What changed:  when to take this   ranitidine 150 MG tablet Commonly known as:  ZANTAC Take 75 mg by mouth daily.      Follow-up Information    Aviva Signs, MD. Schedule an appointment as soon as possible for a visit on 01/27/2017.   Specialty:  General Surgery Contact information: [id number] [address] [phone]           Signed: Virl Cagey 01/17/2017, 10:42 AM

## 2017-01-19 ENCOUNTER — Encounter (HOSPITAL_COMMUNITY): Payer: Self-pay | Admitting: General Surgery

## 2017-01-27 ENCOUNTER — Ambulatory Visit (INDEPENDENT_AMBULATORY_CARE_PROVIDER_SITE_OTHER): Payer: Self-pay | Admitting: General Surgery

## 2017-01-27 ENCOUNTER — Encounter: Payer: Self-pay | Admitting: General Surgery

## 2017-01-27 VITALS — BP 145/80 | HR 109 | Temp 98.4°F | Ht 61.0 in | Wt 126.0 lb

## 2017-01-27 DIAGNOSIS — Z09 Encounter for follow-up examination after completed treatment for conditions other than malignant neoplasm: Secondary | ICD-10-CM

## 2017-01-27 NOTE — Progress Notes (Signed)
Subjective:     Margaret Thompson  Status post left simple mastectomy.  Patient doing well.  Minimal JP output.  Denies any significant pain. Objective:    BP (!) 145/80   Pulse (!) 109   Temp 98.4 F (36.9 C)   Ht 5\' 1"  (1.549 m)   Wt 126 lb (57.2 kg)   BMI 23.81 kg/m   General:  alert, cooperative and no distress  Left mastectomy healing well.  Staples removed, Steri-Strips applied.  JP drain removed. Final pathology revealed invasive ductal carcinoma, margins clear.     Assessment:    Doing well postoperatively.    Plan:   Will refer to oncology for further evaluation and treatment.  Follow-up here as needed.

## 2017-02-10 ENCOUNTER — Ambulatory Visit (HOSPITAL_COMMUNITY): Payer: Medicare Other | Admitting: Internal Medicine

## 2017-02-27 ENCOUNTER — Inpatient Hospital Stay (HOSPITAL_COMMUNITY): Payer: Medicare PPO | Attending: Internal Medicine | Admitting: Oncology

## 2017-02-27 ENCOUNTER — Encounter (HOSPITAL_COMMUNITY): Payer: Self-pay | Admitting: Oncology

## 2017-02-27 VITALS — BP 129/86 | HR 96 | Temp 97.6°F | Resp 18 | Wt 124.9 lb

## 2017-02-27 DIAGNOSIS — Z853 Personal history of malignant neoplasm of breast: Secondary | ICD-10-CM

## 2017-02-27 DIAGNOSIS — I219 Acute myocardial infarction, unspecified: Secondary | ICD-10-CM | POA: Diagnosis not present

## 2017-02-27 DIAGNOSIS — I2119 ST elevation (STEMI) myocardial infarction involving other coronary artery of inferior wall: Secondary | ICD-10-CM

## 2017-02-27 DIAGNOSIS — Z171 Estrogen receptor negative status [ER-]: Secondary | ICD-10-CM | POA: Diagnosis not present

## 2017-02-27 DIAGNOSIS — C50312 Malignant neoplasm of lower-inner quadrant of left female breast: Secondary | ICD-10-CM | POA: Diagnosis not present

## 2017-02-27 NOTE — Assessment & Plan Note (Signed)
Stage IA ([id number]) invasive ductal carcinoma of the left breast, lower-inner quadrant, initially diagnosed on needle core biopsy on 08/19/2016 showing a G3 IDC with DCIS, ER/PR/HER2-.  Patient delayed definitive surgical management until she underwent a left simple mastectomy by Dr. Arnoldo Morale on 01/16/2017 showing a 1.8 cm primary tumor, G3, with LVI, and clear surgical margins.  DCIS is also noted (high-grade).  No lymph nodes removed due to her past surgical history for previous left breast cancer treatment.  Adjuvant chemotherapy is recommended.  I have recommended TC x 4 cycles in the adjuvant setting (adriamycin-containing regimen not recommended due to her cardiac history and co-morbidities).  Adjuvant XRT is not recommended due to her age (and previous XRT for past left breast cancer).  No role for labs today.  Baseline labs for Day 1 of each cycle: CBC diff, CMET.  Patient is here to learn medical oncology recommendations after completing definitive surgical management.    I personally reviewed and went over pathology results with the patient.  I have reviewed her surgical pathology with the patient.  She is provided a copy of her pathology report for her review.    I have reviewed the NCCN guidelines pertaining to treatment for a T1 triple negative breast cancer.  I have reviewed recommended treatment options and the fact that we will not offer her adriamycin-containing chemotherapy given her cardiac history.  Instead, we reviewed the next best treatment option TC x 4 cycles.    I have reviewed the risks, benefits, alternatives, and side effects of TC chemotherapy including, but not limited to, nausea, vomiting, diarrhea, constipation, alopecia, fatigue/tiredness, decrease in blood count, increased risk for infection, renal function changes, hepatic function changes, anaphylaxis, bone pain, peripheral neuropathy, neurotoxicity, ototoxicity, and death.  She is agreeable to adjuvant  chemotherapy.  Will refer patient back to Dr. Arnoldo Morale for port-a-cath placement.  Will refer patient for chemotherapy teaching.  Orders are placed for chemotherapy (TC x 4 cycles every 21 days).    Return in 1.5-2 weeks for follow-up and to embark on cycle #1 of treatment.

## 2017-02-27 NOTE — Patient Instructions (Signed)
[place] at [facility] Discharge Instructions  RECOMMENDATIONS MADE BY THE CONSULTANT AND ANY TEST RESULTS WILL BE SENT TO YOUR REFERRING PHYSICIAN.  Mastectomy completed and reveals a 1.8 cm tumor.  As a result, it is recommended that you undergo chemotherapy for 4 cycles with docetaxel and cyclophosphamide.  We will refer you for chemotherapy teaching.  We will refer you to Dr. Arnoldo Morale for port placement. Will plan on starting chemotherapy within the next 2 weeks.  Thank you for choosing [place] at [facility] to provide your oncology and hematology care.  To afford each patient quality time with our provider, please arrive at least 15 minutes before your scheduled appointment time.    If you have a lab appointment with the [facility] please come in thru the  Main Entrance and check in at the main information desk  You need to re-schedule your appointment should you arrive 10 or more minutes late.  We strive to give you quality time with our providers, and arriving late affects you and other patients whose appointments are after yours.  Also, if you no show three or more times for appointments you may be dismissed from the clinic at the providers discretion.     Again, thank you for choosing [facility].  Our hope is that these requests will decrease the amount of time that you wait before being seen by our physicians.       _____________________________________________________________  Should you have questions after your visit to [facility], please contact our office at [phone] between the hours of 8:30 a.m. and 4:30 p.m.  Voicemails left after 4:30 p.m. will not be returned until the following business day.  For prescription refill requests, have your pharmacy contact our office.       Resources For Cancer Patients and their Caregivers ? American Cancer Society: Can assist with transportation, wigs,  general needs, runs Look Good Feel Better.        936-194-6483 ? Cancer Care: Provides financial assistance, online support groups, medication/co-pay assistance.  1-800-813-HOPE 319-201-5441) ? [facility] Assists [place] cancer patients and their families through emotional , educational and financial support.  205-211-3551 ? [place] DSS Where to apply for food stamps, Medicaid and utility assistance. 513-321-0997 ? [facility]: Transportation to medical appointments. (512)352-8651 ? Social Security Administration: May apply for disability if have a Stage IV cancer. 415-767-1592 5710358154 ? [employer], Disability and Transit Services: Assists with nutrition, care and transit needs. [place] Support Programs: @10RELATIVEDAYS @ > Cancer Support Group  2nd Tuesday of the month 1pm-2pm, Journey Room  > Creative Journey  3rd Tuesday of the month 1130am-1pm, Journey Room  > Look Good Feel Better  1st Wednesday of the month 10am-12 noon, Journey Room (Call [place] to register [phone])

## 2017-02-27 NOTE — Progress Notes (Signed)
Redmond School, MD [address] 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 Patient delayed definitive surgical management until she underwent a left simple mastectomy by Dr. Arnoldo Morale on 01/16/2017 showing a 1.8 cm primary tumor, G3, with LVI, and clear surgical margins. DCIS is also noted (high-grade). No lymph nodes removed due to her past surgical history for previous left breast cancer treatment. Adjuvant chemotherapy is recommended. I have recommended TC x 4 cycles in the adjuvant setting (adriamycin-containing regimen not recommended due to her cardiac history and co-morbidities). Adjuvant XRT is not recommended due to her age (and previous XRT for past left breast cancer). AND History of Stage II (T1 C. N1 M0) adenocarcinoma of the left breast with surgery on 03/08/2001. She had a 1.5 cm cancer with 1/6 positive sentinel nodes. Reexcision margins were clear. She did participate in NSABP B.-30 randomized to Adriamycin and Taxotere for 4 cycles followed by radiation therapy from 07/13/2001 to 08/27/2001. She then started Arimidex on 07/07/2001 took that until the end of June 2008. Thus far she has no evidence for recurrence of ER+ breast cancer.  HPI Elements   Location:  Left breast  Quality:  Invasive ductal carcinoma  Severity:  Stage I a  Duration:  Diagnosed on 08/19/2016  Context:  New left breast cancer  Timing:  History of ER positive left breast cancer in 2003  Modifying Factors:  Current cancer is triple negative.  History of myocardial infarction.  Associated Signs & Symptoms:     CURRENT THERAPY:  Review pathology and provide medical oncology recommendations for adjuvant therapy.  CURRENT STATUS: Margaret Thompson 84 y.o. female returns for followup of newly diagnosed left breast cancer, triple negative.  She has tolerated her simple mastectomy on the left well.  She denies any new lumps or bumps on her own examination.  She denies any new pain.  She denies any hemoptysis.  She denies any neurological complaints including headaches, dizziness, double vision, LOC, and seizure.  She is here today to learn her pathology results and to discuss medical oncology recommendations moving forward.    Malignant neoplasm of lower-inner quadrant of left female breast ([place])   08/21/2016 Pathology Results    Left needle core biopsy, lower-inner quadrant: invasive ductal carcinoma and DCIS.  ER/PR-, HER2-.       01/16/2017 Surgery    Left simple mastectomy by Dr. Alean Rinne ductal carcinoma, grade 3, measuring 1.8 cm with DCIS (high-grade).  Lymphovascular invasion is identified.  Clear surgical margins.       Review of Systems  Constitutional: Negative.  Negative for chills, fever and weight loss.  HENT: Negative.   Eyes: Negative.   Respiratory: Negative.  Negative for cough.   Cardiovascular: Negative.  Negative for chest pain.  Gastrointestinal: Negative.  Negative for blood in stool, constipation, diarrhea, melena, nausea and vomiting.  Genitourinary: Negative.   Musculoskeletal: Negative.   Skin: Negative.   Neurological: Negative.  Negative for weakness.  Endo/Heme/Allergies: Negative.   Psychiatric/Behavioral: Negative.     Past Medical History:  Diagnosis Date  . Acute MI ([place]) 2003  . Adenocarcinoma of left breast ([place]) 11/23/2012  Stage II (T1 C. N1 M0) adenocarcinoma of the left breast with surgery on 03/08/2001. She had a 1.5 cm cancer with 1/6 positive sentinel nodes. Reexcision margins were clear. She did participate in NSABP B.-30 randomized to Adriamycin and Taxotere for 4  cycles followed by radiation therapy from 07/13/2001 to 08/27/2001. She then started Arimidex on 07/07/2001 took that until the end of June 2008. T  . Anemia   . Bilateral cataracts   . Breast cancer ([place])   . GERD (gastroesophageal reflux disease)   . History of gout   . Hypercholesteremia   . Hypertension   . Malignant neoplasm of lower-inner quadrant of left female breast ([place])   . Osteoporosis     Past Surgical History:  Procedure Laterality Date  . ABDOMINAL HYSTERECTOMY    . BREAST SURGERY    . CATARACT EXTRACTION W/PHACO Left 09/04/2014   Procedure: CATARACT EXTRACTION PHACO AND INTRAOCULAR LENS PLACEMENT (IOC);  Surgeon: Tonny Branch, MD;  Location: [facility];  Service: Ophthalmology;  Laterality: Left;  CDE:9.84  . CATARACT EXTRACTION W/PHACO Right 11/09/2014   Procedure: CATARACT EXTRACTION PHACO AND INTRAOCULAR LENS PLACEMENT (IOC);  Surgeon: Tonny Branch, MD;  Location: [facility];  Service: Ophthalmology;  Laterality: Right;  CDE:8.40  . CORONARY ANGIOPLASTY     RCA stent  . LEFT HEART CATHETERIZATION WITH CORONARY ANGIOGRAM N/A 12/21/2012   Procedure: LEFT HEART CATHETERIZATION WITH CORONARY ANGIOGRAM;  Surgeon: Clent Demark, MD;  Location: [place] CATH LAB;  Service: Cardiovascular;  Laterality: N/A;  . MASTECTOMY MODIFIED RADICAL Left 01/16/2017   Procedure: SIMPLE MASTECTOMY;  Surgeon: Aviva Signs, MD;  Location: [facility];  Service: General;  Laterality: Left;  Marland Kitchen MASTECTOMY, PARTIAL Left   . PERCUTANEOUS STENT INTERVENTION     RCA  . PORT-A-CATH REMOVAL    . PORTACATH PLACEMENT      Family History  Problem Relation Age of Onset  . Stroke Mother     Social History   Socioeconomic History  . Marital status: Widowed    Spouse name: None  . Number of children: None  . Years of education: None  . Highest education level: None  Social Needs  . Financial resource strain: None  . Food insecurity - worry: None  . Food insecurity - inability: None  . Transportation needs -  medical: None  . Transportation needs - non-medical: None  Occupational History  . None  Tobacco Use  . Smoking status: Current Every Day Smoker    Packs/day: 0.50    Years: 50.00    Pack years: 25.00    Types: Cigarettes  . Smokeless tobacco: Never Used  Substance and Sexual Activity  . Alcohol use: No  . Drug use: No  . Sexual activity: Not Currently    Birth control/protection: Surgical  Other Topics Concern  . None  Social History Narrative  . None     PHYSICAL EXAMINATION  ECOG PERFORMANCE STATUS: 1 - Symptomatic but completely ambulatory  Vitals:   02/27/17 1412  BP: 129/86  Pulse: 96  Resp: 18  Temp: 97.6 F (36.4 C)  SpO2: 96%    GENERAL:alert, no distress, well nourished, well developed, comfortable, cooperative, smiling and unaccompanied SKIN: skin color, texture, turgor are normal, no rashes or significant lesions HEAD: Normocephalic, No masses, lesions, tenderness or abnormalities EYES: normal, EOMI, Conjunctiva are pink and non-injected EARS: External ears normal OROPHARYNX:lips, buccal mucosa, and tongue normal and mucous membranes are moist  NECK: supple, no adenopathy, trachea midline LYMPH:  no palpable  lymphadenopathy BREAST:not examined LUNGS: clear to auscultation and percussion, decreased breath sounds HEART: regular rate & rhythm, no murmurs, no gallops, S1 normal and S2 normal ABDOMEN:abdomen soft and normal bowel sounds BACK: Back symmetric, no curvature. EXTREMITIES:less then 2 second capillary refill, no joint deformities, effusion, or inflammation, no skin discoloration, no cyanosis  NEURO: alert & oriented x 3 with fluent speech, no focal motor/sensory deficits, gait normal  LABORATORY DATA: CBC    Component Value Date/Time   WBC 12.0 (H) 01/17/2017 0530   RBC 3.76 (L) 01/17/2017 0530   HGB 11.4 (L) 01/17/2017 0530   HCT 36.2 01/17/2017 0530   PLT 264 01/17/2017 0530   MCV 96.3 01/17/2017 0530   MCH 30.3 01/17/2017 0530   MCHC  31.5 01/17/2017 0530   RDW 12.8 01/17/2017 0530   LYMPHSABS 2.6 01/12/2017 1428   MONOABS 0.6 01/12/2017 1428   EOSABS 0.1 01/12/2017 1428   BASOSABS 0.0 01/12/2017 1428      Chemistry      Component Value Date/Time   NA 140 01/17/2017 0530   K 3.9 01/17/2017 0530   CL 108 01/17/2017 0530   CO2 24 01/17/2017 0530   BUN 12 01/17/2017 0530   CREATININE 0.80 01/17/2017 0530      Component Value Date/Time   CALCIUM 9.0 01/17/2017 0530   ALKPHOS 89 01/12/2017 1428   AST 15 01/12/2017 1428   ALT 13 (L) 01/12/2017 1428   BILITOT 0.4 01/12/2017 1428       RADIOGRAPHIC STUDIES:  No results found.   PATHOLOGY:    ASSESSMENT AND PLAN:  History of left breast cancer (2003) History of Stage II (T1 C. N1 M0) adenocarcinoma of the left breast with surgery on 03/08/2001. She had a 1.5 cm cancer with 1/6 positive sentinel nodes. Reexcision margins were clear. She did participate in NSABP B.-30 randomized to Adriamycin and Taxotere for 4 cycles followed by radiation therapy from 07/13/2001 to 08/27/2001. She then started Arimidex on 07/07/2001 took that until the end of June 2008. Thus far she has no evidence for recurrence of ER+ breast cancer.  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  Patient delayed definitive surgical management until she underwent a left simple mastectomy by Dr. Arnoldo Morale on 01/16/2017 showing a 1.8 cm primary tumor, G3, with LVI, and clear surgical margins.  DCIS is also noted (high-grade).  No lymph nodes removed due to her past surgical history for previous left breast cancer treatment.  Adjuvant chemotherapy is recommended.  I have recommended TC x 4 cycles in the adjuvant setting (adriamycin-containing regimen not recommended due to her cardiac history and  co-morbidities).  Adjuvant XRT is not recommended due to her age (and previous XRT for past left breast cancer).  No role for labs today.  Baseline labs for Day 1 of each cycle: CBC diff, CMET.  Patient is here to learn medical oncology recommendations after completing definitive surgical management.    I personally reviewed and went over pathology results with the patient.  I have reviewed her surgical pathology with the patient.  She is provided a copy of her pathology report for her review.    I have reviewed the NCCN guidelines pertaining to treatment for a T1 triple negative breast cancer.  I have reviewed recommended treatment options and the fact that we will not  offer her adriamycin-containing chemotherapy given her cardiac history.  Instead, we reviewed the next best treatment option TC x 4 cycles.    I have reviewed the risks, benefits, alternatives, and side effects of TC chemotherapy including, but not limited to, nausea, vomiting, diarrhea, constipation, alopecia, fatigue/tiredness, decrease in blood count, increased risk for infection, renal function changes, hepatic function changes, anaphylaxis, bone pain, peripheral neuropathy, neurotoxicity, ototoxicity, and death.  She is agreeable to adjuvant chemotherapy.  Will refer patient back to Dr. Arnoldo Morale for port-a-cath placement.  Will refer patient for chemotherapy teaching.  Orders are placed for chemotherapy (TC x 4 cycles every 21 days).    Return in 1.5-2 weeks for follow-up and to embark on cycle #1 of treatment.  2. Acute MI, inferoposterior wall ([facility]) History of MI.  Given her cardiac disease, would avoid Adriamycin-containing chemotherapy.    Final Result of Complexity      Choose decision making level with 2 or 3 checks OR choose the decision making level on Section B       A Number of diagnoses or treatment options  []  </= 1 Minimal  []  2 Limited  [x]  3 Multiple  []  >/= 4 Extensive  B Amount and  complexity of data  []  </= 1 Minimal or low  []  2 Limited  []  3  Moderate  [x]  >/= 4 Extensive  C Highest risk  []  Minimal  []  Low  []  Moderate  [x]  High   Type of decision making  []  Straight-forward  []  Low Complexity  []  Moderate- Complexity  [x]  High- Complexity     ORDERS PLACED FOR THIS ENCOUNTER: No orders of the defined types were placed in this encounter.   MEDICATIONS PRESCRIBED THIS ENCOUNTER: No orders of the defined types were placed in this encounter.   THERAPY PLAN:  We will pursue adjuvant systemic chemotherapy with TC x4.  She will be referred to Dr. Arnoldo Morale for Port-A-Cath placement.  She will be referred for chemotherapy teaching.  All questions were answered. The patient knows to call the clinic with any problems, questions or concerns. We can certainly see the patient much sooner if necessary.  Patient and plan discussed with Dr. Irene Limbo and he is in agreement with the aforementioned.   This note is electronically signed by: Robynn Pane, PA-C 02/27/2017 4:23 PM

## 2017-02-27 NOTE — Assessment & Plan Note (Signed)
History of Stage II (T1 C. N1 M0) adenocarcinoma of the left breast with surgery on 03/08/2001. She had a 1.5 cm cancer with 1/6 positive sentinel nodes. Reexcision margins were clear. She did participate in NSABP B.-30 randomized to Adriamycin and Taxotere for 4 cycles followed by radiation therapy from 07/13/2001 to 08/27/2001. She then started Arimidex on 07/07/2001 took that until the end of June 2008. Thus far she has no evidence for recurrence of ER+ breast cancer.

## 2017-03-02 ENCOUNTER — Ambulatory Visit (HOSPITAL_COMMUNITY): Payer: Medicare PPO | Admitting: Internal Medicine

## 2017-03-03 ENCOUNTER — Encounter (HOSPITAL_COMMUNITY): Payer: Self-pay | Admitting: Emergency Medicine

## 2017-03-03 MED ORDER — LIDOCAINE-PRILOCAINE 2.5-2.5 % EX CREA: TOPICAL_CREAM | CUTANEOUS | 3 refills | Status: DC

## 2017-03-03 MED ORDER — DEXAMETHASONE 4 MG PO TABS: 8.0000 mg | ORAL_TABLET | Freq: Two times a day (BID) | ORAL | 1 refills | Status: DC

## 2017-03-03 MED ORDER — PROCHLORPERAZINE MALEATE 10 MG PO TABS: 10.0000 mg | ORAL_TABLET | Freq: Four times a day (QID) | ORAL | 1 refills | Status: DC | PRN

## 2017-03-03 MED ORDER — ONDANSETRON HCL 8 MG PO TABS: 8.0000 mg | ORAL_TABLET | Freq: Two times a day (BID) | ORAL | 1 refills | Status: DC | PRN

## 2017-03-03 NOTE — Patient Instructions (Signed)
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  It was triple negative which means your hormones ER, PR, HER2 were all negative.  We are going to treat you with taxotere and cytoxan.  You will have 4 cycles.  1 cycle is 3 weeks.  This treatment is with curative intent.  You will see the doctor regularly throughout treatment.  We monitor your lab work prior to every treatment.  The doctor monitors your response to treatment by the way you are feeling, your blood work, and scans periodically.  You can expect wait times while you are here for treatment.  Lab work takes about 30 minutes to 1 hour to result.  There is wait time for pharmacy to mix your medications.    You will receive premeds prior to receiving chemotherapy.  These premeds include: Premeds: Aloxi - high powered nausea/vomiting prevention medication used for chemotherapy patients. Dexamethasone - steroid - given to reduce the risk of you having an allergic type reaction to the chemotherapy. Dex can cause you to feel energized, nervous/anxious/jittery, make you have trouble sleeping, and/or make you feel hot/flushed in the face/neck and/or look pink/red in the face/neck. These side effects will pass as the Dex wears off. (takes 20 minutes to infuse)   You will also get neulasta on-pro after you finish chemotherapy every 3 weeks.  Neulasta - this medication is not chemo but being given because you have had chemo. It is usually given 24-27 hours after the completion of chemotherapy. This medication works by boosting your bone marrow's supply of white blood cells. White blood cells are what protect our bodies against infection. The medication is given in the form of a subcutaneous injection. It is given in the fatty tissue of your abdomen. It is a short needle. The major side effect of this medication is bone or muscle pain. The drug of choice  to relieve or lessen the pain is Aleve or Ibuprofen. If a physician has ever told you not to take Aleve or Ibuprofen - then don't take it. You should then take Tylenol/acetaminophen. Take either medication as the bottle directs you to.  The level of pain you experience as a result of this injection can range from none, to mild or moderate, or severe. Please let us know if you develop moderate or severe bone pain.  You can also take Claritin 10 mg over the counter to help with bone pain.   **DO NOT expose the Neulasta On-body injector to diagnostic imaging (CT scans, MRI, Ultrasound, X-ray), radiation treatment, or oxygen rich environments, such as hyperbaric chambers. **    POTENTIAL SIDE EFFECTS OF TREATMENT:  Cyclophosphamide (Generic Name) Other Names: Cytoxan, Neosar . About This Drug Cyclophosphamide is a drug used to treat cancer. It is given in the vein (IV) or by mouth.  This drug takes 30 minutes to infuse.    Possible Side Effects (More Common) . Nausea and throwing up (vomiting). These symptoms may happen within a few hours after your treatment and may last up to 72 hours. Medicines are available to stop or lessen these side effects. . Bone marrow depression. This is a decrease in the number of white blood cells, red blood cells, and platelets. This may raise your risk of infection, make you tired and weak (fatigue), and raise your risk of bleeding. . Hair loss: You may notice hair getting thin. Some patients lose their  hair. Hair loss is often complete scalp hair loss and can involve loss of eyebrows, eyelashes, and pubic hair. You may notice this a few days or weeks after treatment has started. Most often hair loss is temporary; your hair should grow back when treatment is done. . Decreased appetite (decreased hunger) . Blurred vision . Soreness of the mouth and throat. You may have red areas, white patches, or sores that hurt. . Effects on the bladder. This drug may cause  irritation and bleeding in the bladder. You may have blood in your urine. To help stop this, you will get extra fluids to help you pass more urine. You may get a drug called mesna, which helps to decrease irritation and bleeding. You may also get a medicine to help you pass more urine. You may have a catheter (tube) placed in your bladder so that your bladder will be washed with this drug.  Possible Side Effects (Less Common) . Darkening of the skin or nails . Metallic taste in the mouth . Changes in lung tissue may happen with large amounts of this drug. These changes may not last forever, and your lung tissue may go back to normal. Sometimes these changes may not be seen for many years. You may get a cough or have trouble catching your breath.  Allergic Reactions Serious allergic reactions including anaphylaxis are rare. While you are getting this drug in your vein (IV), tell your nurse right away if you have any of these symptoms of an allergic reaction: . Trouble catching your breath . Feeling like your tongue or throat are swelling . Feeling your heart beat quickly or in a not normal way (palpitations) . Feeling dizzy or lightheaded . Flushing, itching, rash, and/or hives  Treating Side Effects . Drink 6-8 cups of fluids each day unless your doctor has told you to limit your fluid intake due to some other health problem. A cup is 8 ounces of fluid. If you throw up or have loose bowel movements you should drink more fluids so that you do not become dehydrated (lack water in the body due to losing too much fluid). . Ask your doctor or nurse about medicine that is available to help stop or lessen nausea or throwing up. . Mouth care is very important. Your mouth care should consist of routine, gentle cleaning of your teeth or dentures and rinsing your mouth with a mixture of 1/2 teaspoon of salt in 8 ounces of water or  teaspoon of baking soda in 8 ounces of water. This should be done at least  after each meal and at bedtime. . If you have mouth sores, avoid mouthwash that has alcohol. Also avoid alcohol and smoking because they can bother your mouth and throat. . Talk with your nurse about getting a wig before you lose your hair. Also, call the [facility] at 800-ACS-2345 to find out information about the " Look Good.Marland KitchenMarland KitchenFeel Better" program close to where you live. It is a free program where women undergoing chemotherapy learn about wigs, turbans and scarves as well as makeup techniques and skin and nail care.  Important Information . Whenever you tell a doctor or nurse your health history, always tell them that you have received cyclophosphamide in the past. . If you take this drug by mouth swallow the medicine whole. Do not chew, break or crush it. . You can take the medicine with or without food. If you have nausea, take it with food. Do not take  the pills at bedtime.  Food and Drug Interactions There are no known interactions of cyclophosphamide with food. This drug may interact with other medicines. Tell your doctor and pharmacist about all the medicines and dietary supplements (vitamins, minerals, herbs and others) that you are taking at this time. The safety and use of dietary supplements and alternative diets are often not known. Using these might affect your cancer or interfere with your treatment. Until more is known, you should not use dietary supplements or alternative diets without your cancer doctor's help.  When to Call the Doctor Call your doctor or nurse right away if you have any of these symptoms: . Fever of 100.5 F (38 C) or higher . Chills . Bleeding or bruising that is not normal . Blurred vision or other changes in eyesight . Pain when passing urine; blood in urine . Pain in your lower back or side . Wheezing or trouble breathing . Swelling of legs, ankles, or feet . Feeling dizzy or lightheaded . Feeling confused or agitated . Signs of liver  problems: dark urine, pale bowel movements, bad stomach pain, feeling very tired and weak, unusual itching, or yellowing of the eyes or skin . Unusual thirst or passing urine often . Nausea that stops you from eating or drinking . Throwing up more than 3 times a day Call your doctor or nurse as soon as possible if any of these symptoms happen: . Pain in your mouth or throat that makes it hard to eat or drink . Nausea not relieved by prescribed medicines  Sexual Problems and Reproductive Concerns . Infertility warning: Sexual problems and reproduction concerns may happen. In both men and women, this drug may affect your ability to have children. This cannot be determined before your treatment. Talk with your doctor or nurse if you plan to have children. Ask for information on sperm or egg banking. . In men, this drug may interfere with your ability to make sperm, but it should not change your ability to have sexual relations. . In women, menstrual bleeding may become irregular or stop while you are getting this drug. Do not assume that you cannot become pregnant if you do not have a menstrual period. . Women may go through signs of menopause (change of life) like vaginal dryness or itching. Vaginal lubricants can be used to lessen vaginal dryness, itching, and pain during sexual relations. . Genetic counseling is available for you to talk about the effects of this drug therapy on future pregnancies. Also, a genetic counselor can look at the possible risk of problems in the unborn baby due to this medicine if an exposure happens during pregnancy. . Pregnancy warning: This drug may have harmful effects on the unborn child, so effective methods of birth control should be used during your cancer treatment. . Breast feeding warning: Women should not breast feed during treatment because this drug could enter the breast milk and badly harm a breast feeding baby    Docetaxel (Generic Name) Other Names:  Taxotere  About This Drug Docetaxel is used to treat cancer. This drug is given in the vein (IV).  This drug will take 1 hours to infuse.  The first time the drug is infused it will be infused slower and take longer than 1 hour.  The nurse will be in the room with you for the first 15 minutes to monitor for reactions.  Possible Side Effects (More Common) . Bone marrow depression. This is a decrease in the number  of white blood cells, red blood cells, and platelets. This may raise your risk of infection, make you tired and weak (fatigue), and raise your risk of bleeding. . Swelling of your legs, ankles, and/or feet. Steroids are often given to lessen this swelling. . Hair loss: Hair loss is often complete scalp hair loss and can involve loss of eyebrows, eyelashes, and pubic hair. You may notice this a few days or weeks after treatment has started. There have been cases of permanent hair loss reported. . Loose bowel movements (diarrhea) that may last for a few days. . Nausea and throwing up (vomiting). These symptoms may happen within a few hours after your treatment and may last up to 24 hours. Medicines are available to stop or lessen these side effects. . Soreness of the mouth and throat. You may have red areas, white patches, or sores that hurt. . Effects on the nerves are called peripheral neuropathy. You may feel numbness, tingling, or pain in your hands and feet. It may be hard for you to button your clothes, open jars, or walk as usual. The effect on the nerves may get worse with more doses of the drug. These effects get better in some people after the drug is stopped but it does not get better in all people. . Skin and nail changes. You may develop a rash or have skin redness and swelling followed by peeling. Nail problems may occur such as changes in nail color, nails becoming thin or brittle, or loss of the nail. Steroids are often given to lessen these side effects. . Weakness that  interferes with your daily activities. . This drug contains alcohol and may affect your central nervous system. The central nervous system is made up of your brain and spinal cord. You may feel drunk during and after your treatment and it can impair your ability to drive or use machinery for one to two hours after infusion.  Possible Side Effects (Less Common) . Skin and tissue irritation may involve redness, pain, warmth, or swelling at the IV site. This happens if the drug leaks out of the vein and into nearby tissue. . Changes in your liver function. Your doctor will check your liver function as needed. . Muscle and joint pain. . Effects on the heart: This drug can weaken the heart and lower heart function. Your heart function will be checked as needed. You may have trouble catching your breath, mainly during activities. You may also have trouble breathing while lying down, and have swelling in your ankles. . This drug may cause an increased risk of developing a second cancer. . Skin and tissue irritation may involve redness, pain, warmth, or swelling at the IV site. This happens if the drug leaks out of the vein and into nearby tissue. Marland Kitchen Blurred vision or other changes in eyesight. . A rare risk of death is increased in people with liver problems. Do not take this drug if you have liver disease and talk to your doctor.  Allergic Reactions Serious allergic reactions, including anaphylaxis are rare. While you are getting this drug in your vein (IV), tell your nurse right away if you have any of these symptoms of an allergic reaction: . Trouble catching your breath . Feeling like your tongue or throat are swelling . Feeling your heart beat quickly or in a not normal way (palpitations) . Feeling dizzy or lightheaded . Flushing, itching, rash, and/or hives  Infusion Reactions While you are getting this drug in your  vein (IV), you may have a reaction. Your nurse will check you closely for these  signs: fever or shaking chills, flushing, facial swelling, feeling dizzy, headache, trouble breathing, rash, itching, chest tightness, or chest pain. Less serious reactions to this drug may also happen. You will be given medicines to help stop or lessen these symptoms. Your vital signs will be checked during the infusion. Tell your doctor or nurse right away if you have any of these symptoms at any time during the infusion and/or for the first 24 hours after getting this drug: . Fever, chills, or shaking chills . Feeling dizzy or lightheaded . Headache . Nausea or throwing up  Treating Side Effects . Drink 6-8 cups of fluids every day unless your doctor has told you to limit your fluid intake due to some other health problem. A cup is 8 ounces of fluid. If you throw up or have loose bowel movements, you should drink more fluids so that you do not become dehydrated (lack water in the body due to losing too much fluid). . Talk with your nurse about getting a wig before you lose your hair. Also, call the [facility] at 800-ACS-2345 to find out information about the " Look Good,Feel Better" program close to where you live. It is a free program where women undergoing chemotherapy can learn about wigs, turbans and scarves as well as makeup techniques and skin and nail care. . Do not put anything on a rash unless your doctor or nurse says you may. Keep the area around the rash clean and dry. Ask your doctor for medicine if your rash bothers you. . Mouth care is very important. Your mouth care should consist of routine, gentle cleaning of your teeth or dentures and rinsing your mouth with a mixture of 1/2 teaspoon of salt in 8 ounces of water or  teaspoon of baking soda in 8 ounces of water. This should be done at least after each meal and at bedtime. . If you have mouth sores, avoid mouthwash that has alcohol. Avoid alcohol and smoking because they can bother your mouth and throat. . Ask your  doctor or nurse about medicine to stop or lessen loose bowel movements, nausea, throwing up, and joint and muscle pain. . If you have numbness and tingling in your hands and feet, be careful when cooking, walking, and handling sharp objects and hot liquids.    Food and Drug Interactions There are no known interactions of docetaxel with food. This drug may interact with other medicines. Tell your doctor and pharmacist about all the medicines and dietary supplements (vitamins, minerals, herbs and others) that you are taking at this time. The safety and use of dietary supplements and alternative diets are often not known. Using these might affect your cancer or interfere with your treatment. Until more is known, you should not use dietary supplements or alternative diets without your cancer doctor's help.  When to Call the Doctor Call your doctor or nurse right away if you have any of these symptoms: . Fever of 100.5 F (38 C) or above . Chills . Easy bruising or bleeding . Wheezing or trouble breathing . Rash or itching . Feeling dizzy or lightheaded . Loose bowel movements (diarrhea) more than 4 times a day or diarrhea with weakness or feeling lightheaded . Nausea that stops you from eating or drinking . Throwing up more than 3 times a day . Signs of liver problems: dark urine, pale bowel movements, bad stomach  pain, feeling very tired and weak, unusual itching, or yellowing of the eyes or skin . Eye irritation, blurred vision or other changes in eyesight Call your doctor or nurse as soon as possible if any of these symptoms happen: . Decreased urine . Pain in your mouth or throat that makes it hard to eat or drink . Nausea that is not relieved by prescribed medicines . Rash that is not relieved by prescribed medicines . Numbness, tingling, decreased feeling or weakness in fingers, toes, arms, or legs . Trouble walking or changes in the way you walk, feeling clumsy when buttoning clothes,  opening jars, or other routine hand motions . Swelling of legs, ankles, or feet . Weight gain of 5 pounds in one week (fluid retention) . Fatigue that interferes with your daily activities . Headache that does not go away . Extreme weakness that interferes with normal activities . While you are getting this drug, please tell your nurse right away if you have any pain, redness, or swelling at the site of the IV infusion . Symptoms of being drunk, confusion, or being very sleepy  Sexual Problems and Reproductive Concerns . Pregnancy warning: This drug may have harmful effects on the unborn child, so effective methods of birth control should be used during your cancer treatment. Genetic counseling is available for you to talk about the effects of this drug therapy on future pregnancies. Also, a genetic counselor can look at the possible risk of problems in the unborn baby due to this medicine if an exposure happens during pregnancy. . Breast feeding warning: It is not known if this drug passes into breast milk. For this reason, women should talk to their doctor about the risks and benefits of breast feeding during treatment with this drug because this drug may enter the breast milk and badly harm a breast feeding baby.    SELF CARE ACTIVITIES WHILE ON CHEMOTHERAPY:  Hydration Increase your fluid intake 48 hours prior to treatment and drink at least 8 to 12 cups (64 ounces) of water/decaff beverages per day after treatment. You can still have your cup of coffee or soda but these beverages do not count as part of your 8 to 12 cups that you need to drink daily. No alcohol intake.  Medications Continue taking your normal prescription medication as prescribed.  If you start any new herbal or new supplements please let us know first to make sure it is safe.  Mouth Care Have teeth cleaned professionally before starting treatment. Keep dentures and partial plates clean. Use soft toothbrush and do not  use mouthwashes that contain alcohol. Biotene is a good mouthwash that is available at most pharmacies or may be ordered by calling [phone]. Use warm salt water gargles (1 teaspoon salt per 1 quart warm water) before and after meals and at bedtime. Or you may rinse with 2 tablespoons of three-percent hydrogen peroxide mixed in eight ounces of water. If you are still having problems with your mouth or sores in your mouth please call the clinic. If you need dental work, please let doctor know before you go for your appointment so that we can coordinate the best possible time for you in regards to your chemo regimen. You need to also let your dentist know that you are actively taking chemo. We may need to do labs prior to your dental appointment.  Skin Care Always use sunscreen that has not expired and with SPF (Sun Protection Factor) of 50 or higher. Wear  hats to protect your head from the sun. Remember to use sunscreen on your hands, ears, face, & feet.  Use good moisturizing lotions such as udder cream, eucerin, or even Vaseline. Some chemotherapies can cause dry skin, color changes in your skin and nails.    . Avoid long, hot showers or baths. . Use gentle, fragrance-free soaps and laundry detergent. . Use moisturizers, preferably creams or ointments rather than lotions because the thicker consistency is better at preventing skin dehydration. Apply the cream or ointment within 15 minutes of showering. Reapply moisturizer at night, and moisturize your hands every time after you wash them.  Hair Loss (if your doctor says your hair will fall out)  . If your doctor says that your hair is likely to fall out, decide before you begin chemo whether you want to wear a wig. You may want to shop before treatment to match your hair color. . Hats, turbans, and scarves can also camouflage hair loss, although some people prefer to leave their heads uncovered. If you go bare-headed outdoors, be sure to use  sunscreen on your scalp. . Cut your hair short. It eases the inconvenience of shedding lots of hair, but it also can reduce the emotional impact of watching your hair fall out. . Don't perm or color your hair during chemotherapy. Those chemical treatments are already damaging to hair and can enhance hair loss. Once your chemo treatments are done and your hair has grown back, it's OK to resume dyeing or perming hair. With chemotherapy, hair loss is almost always temporary. But when it grows back, it may be a different color or texture. In older adults who still had hair color before chemotherapy, the new growth may be completely gray.  Often, new hair is very fine and soft.  Infection Prevention Please wash your hands for at least 30 seconds using warm soapy water. Handwashing is the #1 way to prevent the spread of germs. Stay away from sick people or people who are getting over a cold. If you develop respiratory systems such as green/yellow mucus production or productive cough or persistent cough let us know and we will see if you need an antibiotic. It is a good idea to keep a pair of gloves on when going into grocery stores/[employer] to decrease your risk of coming into contact with germs on the carts, etc. Carry alcohol hand gel with you at all times and use it frequently if out in public. If your temperature reaches 100.5 or higher please call the clinic and let us know.  If it is after hours or on the weekend please go to the ER if your temperature is over 100.5.  Please have your own personal thermometer at home to use.    Sex and bodily fluids If you are going to have sex, a condom must be used to protect the person that isn't taking chemotherapy. Chemo can decrease your libido (sex drive). For a few days after chemotherapy, chemotherapy can be excreted through your bodily fluids.  When using the toilet please close the lid and flush the toilet twice.  Do this for a few day after you have had  chemotherapy.   Effects of chemotherapy on your sex life Some changes are simple and won't last long. They won't affect your sex life permanently. Sometimes you may feel: . too tired . not strong enough to be very active . sick or sore  . not in the mood . anxious or low Your anxiety  might not seem related to sex. For example, you may be worried about the cancer and how your treatment is going. Or you may be worried about money, or about how you family are coping with your illness. These things can cause stress, which can affect your interest in sex. It's important to talk to your partner about how you feel. Remember - the changes to your sex life don't usually last long. There's usually no medical reason to stop having sex during chemo. The drugs won't have any long term physical effects on your performance or enjoyment of sex. Cancer can't be passed on to your partner during sex  Contraception It's important to use reliable contraception during treatment. Avoid getting pregnant while you or your partner are having chemotherapy. This is because the drugs may harm the baby. Sometimes chemotherapy drugs can leave a man or woman infertile.  This means you would not be able to have children in the future. You might want to talk to someone about permanent infertility. It can be very difficult to learn that you may no longer be able to have children. Some people find counselling helpful. There might be ways to preserve your fertility, although this is easier for men than for women. You may want to speak to a fertility expert. You can talk about sperm banking or harvesting your eggs. You can also ask about other fertility options, such as donor eggs. If you have or have had breast cancer, your doctor might advise you not to take the contraceptive pill. This is because the hormones in it might affect the cancer.  It is not known for sure whether or not chemotherapy drugs can be passed on through semen or  secretions from the vagina. Because of this some doctors advise people to use a barrier method if you have sex during treatment. This applies to vaginal, anal or oral sex. Generally, doctors advise a barrier method only for the time you are actually having the treatment and for about a week after your treatment. Advice like this can be worrying, but this does not mean that you have to avoid being intimate with your partner. You can still have close contact with your partner and continue to enjoy sex. Animals If you have cats or birds we just ask that you not change the litter or change the cage.  Please have someone else do this for you while you are on chemotherapy.   Food Safety During and After Cancer Treatment Food safety is important for people both during and after cancer treatment. Cancer and cancer treatments, such as chemotherapy, radiation therapy, and stem cell/bone marrow transplantation, often weaken the immune system. This makes it harder for your body to protect itself from foodborne illness, also called food poisoning. Foodborne illness is caused by eating food that contains harmful bacteria, parasites, or viruses.  Foods to avoid Some foods have a higher risk of becoming tainted with bacteria. These include: Marland Kitchen Unwashed fresh fruit and vegetables, especially leafy vegetables that can hide dirt and other contaminants . Raw sprouts, such as alfalfa sprouts . Raw or undercooked beef, especially ground beef, or other raw or undercooked meat and poultry . Fatty, fried, or spicy foods immediately before or after treatment.  These can sit heavy on your stomach and make you feel nauseous. . Raw or undercooked shellfish, such as oysters. . Sushi and sashimi, which often contain raw fish.  . Unpasteurized beverages, such as unpasteurized fruit juices, raw milk, raw yogurt, or cider .  Undercooked eggs, such as soft boiled, over easy, and poached; raw, unpasteurized eggs; or foods made with raw  egg, such as homemade raw cookie dough and homemade mayonnaise Simple steps for food safety Shop smart. . Do not buy food stored or displayed in an unclean area. . Do not buy bruised or damaged fruits or vegetables. . Do not buy cans that have cracks, dents, or bulges. . Pick up foods that can spoil at the end of your shopping trip and store them in a cooler on the way home. Prepare and clean up foods carefully. . Rinse all fresh fruits and vegetables under running water, and dry them with a clean towel or paper towel. . Clean the top of cans before opening them. . After preparing food, wash your hands for 20 seconds with hot water and soap. Pay special attention to areas between fingers and under nails. . Clean your utensils and dishes with hot water and soap. Marland Kitchen Disinfect your kitchen and cutting boards using 1 teaspoon of liquid, unscented bleach mixed into 1 quart of water.   Dispose of old food. . Eat canned and packaged food before its expiration date (the "use by" or "best before" date). . Consume refrigerated leftovers within 3 to 4 days. After that time, throw out the food. Even if the food does not smell or look spoiled, it still may be unsafe. Some bacteria, such as Listeria, can grow even on foods stored in the refrigerator if they are kept for too long. Take precautions when eating out. . At restaurants, avoid buffets and salad bars where food sits out for a long time and comes in contact with many people. Food can become contaminated when someone with a virus, often a norovirus, or another "bug" handles it. . Put any leftover food in a "to-go" container yourself, rather than having the server do it. And, refrigerate leftovers as soon as you get home. . Choose restaurants that are clean and that are willing to prepare your food as you order it cooked.   MEDICATIONS: Dexamethasone 46m tablet.  Take 2 tablets (8 mg total) by mouth 2 (two) times daily. Start the day before Taxotere.  Then again the day after chemo for 3 days.   Zofran/Ondansetron 833mtablet. Take 1 tablet (8 mg total) by mouth 2 (two) times daily as needed for refractory nausea / vomiting. Start on day 3 after chemo.  Compazine/Prochlorperazine 1047mablet. Take 1 tablet every 6 hours as needed for nausea/vomiting. (#2 nausea med to take, this can make you sleepy)   EMLA cream. Apply a quarter size amount to port site 1 hour prior to chemo. Do not rub in. Cover with plastic wrap.   Over-the-Counter Meds:  Miralax 1 capful in 8 oz of fluid daily. May increase to two times a day if needed. This is a stool softener. If this doesn't work proceed you can add:  Senokot S-start with 1 tablet two times a day and increase to 4 tablets two times a day if needed. (total of 8 tablets in a 24 hour period). This is a stimulant laxative.   Call us [place] this does not help your bowels move.   Imodium 2mg47mpsule. Take 2 capsules after the 1st loose stool and then 1 capsule every 2 hours until you go a total of 12 hours without having a loose stool. Call the [facility] continue. If diarrhea occurs @ bedtime, take 2 capsules @ bedtime. Then take 2 capsules  every 4 hours until morning. Call [facility].     Constipation Sheet *Miralax in 8 oz of fluid daily.  May increase to two times a day if needed.  This is a stool softener.  If this not enough to keep your bowel regular:  You can add:  *Senokot S, start with one tablet twice a day and can increase to 4 tablets twice a day if needed.  This is a stimulant laxative.   Sometimes when you take pain medication you need BOTH a medicine to keep your stool soft and a medicine to help your bowel push it out!  Please call if the above does not work for you.   Do not go more than 2 days without a bowel movement.  It is very important that you do not become constipated.  It will make you feel sick to your stomach (nausea) and can cause abdominal pain and  vomiting.    Diarrhea Sheet  If you are having loose stools/diarrhea, please purchase Imodium and begin taking as outlined:  At the first sign of poorly formed or loose stools you should begin taking Imodium(loperamide) 2 mg capsules.  Take two caplets ('4mg'$ ) followed by one caplet ('2mg'$ ) every 2 hours until you have had no diarrhea for 12 hours.  During the night take two caplets ('4mg'$ ) at bedtime and continue every 4 hours during the night until the morning.  Stop taking Imodium only after there is no sign of diarrhea for 12 hours.    Always call the [facility] if you are having loose stools/diarrhea that you can't get under control.  Loose stools/disrrhea leads to dehydration (loss of water) in your body.  We have other options of trying to get the loose stools/diarrhea to stopped but you must let us know!    Nausea Sheet  Zofran/Ondansetron '8mg'$  tablet. Take 1 tablet every 8 hours as needed for nausea/vomiting. (#1 nausea med to take, this can constipate)  Compazine/Prochlorperazine '10mg'$  tablet. Take 1 tablet every 6 hours as needed for nausea/vomiting. (#2 nausea med to take, this can make you sleepy)  You can take these medications together or separately.  We would first like for you to try the Ondansetron by itself and then take the Prochloperizine if needed. But you are allowed to take both medications at the same time if your nausea is that severe.  If you are having persistent nausea (nausea that does not stop) please take these medications on a staggered schedule so that the nausea medication stays in your body.  Please call the [facility] and let us know the amount of nausea that you are experiencing.  If you begin to vomit, you need to call the [facility] and if it is the weekend and you have vomited more than one time and cant get it to stop-go to the Emergency Room.  Persistent nausea/vomiting can lead to dehydration (loss of fluid in your body) and will make you feel terrible.    Ice chips, sips of clear liquids, foods that are @ room temperature, crackers, and toast tend to be better tolerated.    SYMPTOMS TO REPORT AS SOON AS POSSIBLE AFTER TREATMENT:  FEVER GREATER THAN 100.5 F  CHILLS WITH OR WITHOUT FEVER  NAUSEA AND VOMITING THAT IS NOT CONTROLLED WITH YOUR NAUSEA MEDICATION  UNUSUAL SHORTNESS OF BREATH  UNUSUAL BRUISING OR BLEEDING  TENDERNESS IN MOUTH AND THROAT WITH OR WITHOUT PRESENCE OF ULCERS  URINARY PROBLEMS  BOWEL PROBLEMS  UNUSUAL RASH  Wear comfortable clothing and clothing appropriate for easy access to any Portacath or PICC line. Let us know if there is anything that we can do to make your therapy better! What to do if you need assistance after hours or on the weekends:  CALL [phone].  HOLD on the line, do not hang up.  You will hear multiple messages but at the end you will be connected with a nurse triage line.  They will contact the doctor if necessary.  Most of the time they will be able to assist you.  Do not call the hospital operator.      I have been informed and understand all of the instructions given to me and have received a copy. I have been instructed to call the clinic [phone] or my family physician as soon as possible for continued medical care, if indicated. I do not have any more questions at this time but understand that I may call the [facility] at [phone] during office hours should I have questions or need assistance in obtaining follow-up care.

## 2017-03-03 NOTE — Progress Notes (Signed)
Chemotherapy teaching pulled together and appts made.

## 2017-03-04 ENCOUNTER — Telehealth (HOSPITAL_COMMUNITY): Payer: Self-pay | Admitting: Emergency Medicine

## 2017-03-04 NOTE — Telephone Encounter (Signed)
Called pt to clarify with her as to why she needed chemo since Dr Arnoldo Morale removed all the cancer when he did her surgery.  Explained that her cancer was different this time.  She is hormone receptor negative which is more aggressive then the cancer she had previously.  If we treat her by the guidelines with the 4 cycles then it will help lower the risk of her cancer coming back. She was very thankful for the call and helping easing her mind.  My phone number was provided and I told her I would see her on Monday at 11 am for chemotherapy teaching.

## 2017-03-06 ENCOUNTER — Encounter (HOSPITAL_COMMUNITY)
Admission: RE | Admit: 2017-03-06 | Discharge: 2017-03-06 | Disposition: A | Discharge status: Home / Self Care | Payer: Medicare PPO | Source: Ambulatory Visit | Attending: General Surgery | Admitting: General Surgery

## 2017-03-06 ENCOUNTER — Encounter (HOSPITAL_COMMUNITY): Payer: Self-pay

## 2017-03-06 NOTE — H&P (Signed)
Margaret Thompson; [id number]; 1933-08-10   HPI   Patient is an 84 year old white female status post left simple mastectomy who now presents for Port-A-Cath insertion.  She is about to undergo chemotherapy for recurrent left breast cancer.     Past Medical History:  Diagnosis Date  . Acute MI ([place]) 2003  . Adenocarcinoma of left breast ([place]) 11/23/2012   Stage II (T1 C. N1 M0) adenocarcinoma of the left breast with surgery on 03/08/2001. She had a 1.5 cm cancer with 1/6 positive sentinel nodes. Reexcision margins were clear. She did participate in NSABP B.-30 randomized to Adriamycin and Taxotere for 4 cycles followed by radiation therapy from 07/13/2001 to 08/27/2001. She then started Arimidex on 07/07/2001 took that until the end of June 2008. T  . Bilateral cataracts   . Breast cancer ([place])   . GERD (gastroesophageal reflux disease)   . History of gout   . Hypercholesteremia   . Hypertension   . Osteoporosis          Past Surgical History:  Procedure Laterality Date  . ABDOMINAL HYSTERECTOMY    . BREAST SURGERY    . CATARACT EXTRACTION W/PHACO Left 09/04/2014   Procedure: CATARACT EXTRACTION PHACO AND INTRAOCULAR LENS PLACEMENT (IOC);  Surgeon: Tonny Branch, MD;  Location: [facility];  Service: Ophthalmology;  Laterality: Left;  CDE:9.84  . CATARACT EXTRACTION W/PHACO Right 11/09/2014   Procedure: CATARACT EXTRACTION PHACO AND INTRAOCULAR LENS PLACEMENT (IOC);  Surgeon: Tonny Branch, MD;  Location: [facility];  Service: Ophthalmology;  Laterality: Right;  CDE:8.40  . CORONARY ANGIOPLASTY     RCA stent  . LEFT HEART CATHETERIZATION WITH CORONARY ANGIOGRAM N/A 12/21/2012   Procedure: LEFT HEART CATHETERIZATION WITH CORONARY ANGIOGRAM;  Surgeon: Clent Demark, MD;  Location: [facility];  Service: Cardiovascular;  Laterality: N/A;  . MASTECTOMY, PARTIAL Left   . PERCUTANEOUS STENT INTERVENTION     RCA  . PORT-A-CATH REMOVAL    . PORTACATH PLACEMENT            Family History  Problem Relation Age of Onset  . Stroke Mother           Current Outpatient Prescriptions on File Prior to Visit  Medication Sig Dispense Refill  . aspirin EC 81 MG EC tablet Take 1 tablet (81 mg total) by mouth daily. 30 tablet 3  . atorvastatin (LIPITOR) 80 MG tablet Take 80 mg by mouth every evening.     Marland Kitchen CARTIA XT 180 MG 24 hr capsule Take 180 mg by mouth daily.    . diphenhydrAMINE (BENADRYL) 25 mg capsule Take 1 capsule (25 mg total) by mouth every 6 (six) hours as needed. 30 capsule 0  . fluticasone (FLONASE) 50 MCG/ACT nasal spray Place 1 spray into both nostrils daily. 16 g 0  . levofloxacin (LEVAQUIN) 750 MG tablet Take 1 tablet (750 mg total) by mouth daily. X 7 days 7 tablet 0  . nitroGLYCERIN (NITROSTAT) 0.4 MG SL tablet Place 1 tablet (0.4 mg total) under the tongue every 5 (five) minutes x 3 doses as needed for chest pain. (Patient not taking: Reported on 11/27/2015) 25 tablet 12  . predniSONE (DELTASONE) 10 MG tablet Take 6 tablets day one, 5 tablets day two, 4 tablets day three, 3 tablets day four, 2 tablets day five, then 1 tablet day six 21 tablet 0  . [DISCONTINUED] Cetirizine HCl (ZYRTEC ALLERGY) 10 MG CAPS Take 1 capsule (10 mg total) by mouth daily. (Patient not taking: Reported on 02/08/2014)  10 capsule 0   No current facility-administered medications on file prior to visit.         Allergies  Allergen Reactions  . Azithromycin Hives       History  Alcohol Use No        History  Smoking Status  . Current Every Day Smoker  . Packs/day: 0.50  . Years: 50.00  . Types: Cigarettes  Smokeless Tobacco  . Never Used    Review of Systems  Constitutional: Negative.   HENT: Negative.   Eyes: Negative.   Respiratory: Negative.   Cardiovascular: Negative.   Gastrointestinal: Positive for heartburn.  Genitourinary: Negative.   Musculoskeletal: Negative.   Skin: Negative.   Neurological: Negative.   Endo/Heme/Allergies:  Negative.   Psychiatric/Behavioral: Negative.     Objective      Vitals:   09/09/16 1238  BP: 128/76  Pulse: (!) 109  Resp: 18  Temp: 98 F (36.7 C)    Physical Exam  Constitutional: She is oriented to person, place, and time and well-developed, well-nourished, and in no distress.  HENT:  Head: Normocephalic and atraumatic.  Cardiovascular: Normal rate, regular rhythm and normal heart sounds.  Exam reveals no friction rub.   No murmur heard. Pulmonary/Chest: Effort normal and breath sounds normal. No respiratory distress. She has no wheezes. She has no rales.  Neurological: She is alert and oriented to person, place, and time.  Skin: Skin is warm and dry.  Vitals reviewed. Breast:  No dominant mass, nipple discharge, dimpling in either breast.  Axillas negative for palpable nodes.  Mammogram and path reports reviewed.  Assessment  Recurrent left breast cancer Plan     Patient is scheduled for Port-A-Cath insertion on 03/11/2017.  The risks and benefits of the procedure including bleeding, infection, and pneumothorax were fully explained to the patient, who gave informed consent.

## 2017-03-09 ENCOUNTER — Inpatient Hospital Stay (HOSPITAL_COMMUNITY): Payer: Medicare PPO

## 2017-03-09 DIAGNOSIS — C50312 Malignant neoplasm of lower-inner quadrant of left female breast: Secondary | ICD-10-CM

## 2017-03-09 NOTE — Progress Notes (Signed)
Chemotherapy teaching completed.  Consent signed.  Extensive teaching packet given.   

## 2017-03-11 ENCOUNTER — Ambulatory Visit (HOSPITAL_COMMUNITY): Payer: Medicare PPO

## 2017-03-11 ENCOUNTER — Encounter (HOSPITAL_COMMUNITY)
Admission: RE | Disposition: A | Discharge status: Home / Self Care | Payer: Self-pay | Source: Ambulatory Visit | Attending: General Surgery

## 2017-03-11 ENCOUNTER — Encounter (HOSPITAL_COMMUNITY): Payer: Self-pay | Admitting: *Deleted

## 2017-03-11 ENCOUNTER — Ambulatory Visit (HOSPITAL_COMMUNITY): Payer: Medicare PPO | Admitting: Anesthesiology

## 2017-03-11 ENCOUNTER — Ambulatory Visit (HOSPITAL_COMMUNITY)
Admission: RE | Admit: 2017-03-11 | Discharge: 2017-03-11 | Disposition: A | Discharge status: Home / Self Care | Payer: Medicare PPO | Source: Ambulatory Visit | Attending: General Surgery | Admitting: General Surgery

## 2017-03-11 DIAGNOSIS — Z7982 Long term (current) use of aspirin: Secondary | ICD-10-CM | POA: Insufficient documentation

## 2017-03-11 DIAGNOSIS — Z79899 Other long term (current) drug therapy: Secondary | ICD-10-CM | POA: Diagnosis not present

## 2017-03-11 DIAGNOSIS — Z923 Personal history of irradiation: Secondary | ICD-10-CM | POA: Diagnosis not present

## 2017-03-11 DIAGNOSIS — I252 Old myocardial infarction: Secondary | ICD-10-CM | POA: Diagnosis not present

## 2017-03-11 DIAGNOSIS — Z95828 Presence of other vascular implants and grafts: Secondary | ICD-10-CM

## 2017-03-11 DIAGNOSIS — E7800 Pure hypercholesterolemia, unspecified: Secondary | ICD-10-CM | POA: Insufficient documentation

## 2017-03-11 DIAGNOSIS — C50312 Malignant neoplasm of lower-inner quadrant of left female breast: Secondary | ICD-10-CM | POA: Diagnosis not present

## 2017-03-11 DIAGNOSIS — C50912 Malignant neoplasm of unspecified site of left female breast: Secondary | ICD-10-CM | POA: Diagnosis present

## 2017-03-11 DIAGNOSIS — Z955 Presence of coronary angioplasty implant and graft: Secondary | ICD-10-CM | POA: Diagnosis not present

## 2017-03-11 DIAGNOSIS — Z888 Allergy status to other drugs, medicaments and biological substances status: Secondary | ICD-10-CM | POA: Diagnosis not present

## 2017-03-11 DIAGNOSIS — Z7951 Long term (current) use of inhaled steroids: Secondary | ICD-10-CM | POA: Diagnosis not present

## 2017-03-11 DIAGNOSIS — I10 Essential (primary) hypertension: Secondary | ICD-10-CM | POA: Insufficient documentation

## 2017-03-11 DIAGNOSIS — Z9012 Acquired absence of left breast and nipple: Secondary | ICD-10-CM | POA: Diagnosis not present

## 2017-03-11 DIAGNOSIS — Z881 Allergy status to other antibiotic agents status: Secondary | ICD-10-CM | POA: Insufficient documentation

## 2017-03-11 DIAGNOSIS — Z853 Personal history of malignant neoplasm of breast: Secondary | ICD-10-CM | POA: Diagnosis not present

## 2017-03-11 DIAGNOSIS — F17210 Nicotine dependence, cigarettes, uncomplicated: Secondary | ICD-10-CM | POA: Insufficient documentation

## 2017-03-11 DIAGNOSIS — Z7952 Long term (current) use of systemic steroids: Secondary | ICD-10-CM | POA: Insufficient documentation

## 2017-03-11 HISTORY — PX: PORTACATH PLACEMENT: SHX2246

## 2017-03-11 SURGERY — INSERTION, TUNNELED CENTRAL VENOUS DEVICE, WITH PORT
Anesthesia: Monitor Anesthesia Care | Site: Chest | Laterality: Right

## 2017-03-11 MED ORDER — SODIUM CHLORIDE 0.9 % IV SOLN
INTRAVENOUS | Status: AC | PRN
  Administered 2017-03-11: 500 mL via INTRAMUSCULAR

## 2017-03-11 MED ORDER — CHLORHEXIDINE GLUCONATE CLOTH 2 % EX PADS: 6.0000 | MEDICATED_PAD | Freq: Once | CUTANEOUS | Status: DC

## 2017-03-11 MED ORDER — HEPARIN SOD (PORK) LOCK FLUSH 100 UNIT/ML IV SOLN
INTRAVENOUS | Status: AC
  Filled 2017-03-11: qty 5

## 2017-03-11 MED ORDER — ONDANSETRON HCL 4 MG/2ML IJ SOLN
4.0000 mg | Freq: Once | INTRAMUSCULAR | Status: AC
  Administered 2017-03-11: 4 mg via INTRAVENOUS

## 2017-03-11 MED ORDER — LIDOCAINE HCL (PF) 1 % IJ SOLN
INTRAMUSCULAR | Status: DC | PRN
  Administered 2017-03-11: 9 mL

## 2017-03-11 MED ORDER — MIDAZOLAM HCL 2 MG/2ML IJ SOLN
1.0000 mg | Freq: Once | INTRAMUSCULAR | Status: AC | PRN
  Administered 2017-03-11: 2 mg via INTRAVENOUS

## 2017-03-11 MED ORDER — LACTATED RINGERS IV SOLN
INTRAVENOUS | Status: DC
  Administered 2017-03-11: 1000 mL via INTRAVENOUS

## 2017-03-11 MED ORDER — PROPOFOL 10 MG/ML IV BOLUS
INTRAVENOUS | Status: DC | PRN
  Administered 2017-03-11: 20 mg via INTRAVENOUS

## 2017-03-11 MED ORDER — ONDANSETRON HCL 4 MG/2ML IJ SOLN
INTRAMUSCULAR | Status: AC
  Filled 2017-03-11: qty 2

## 2017-03-11 MED ORDER — KETOROLAC TROMETHAMINE 30 MG/ML IJ SOLN
30.0000 mg | Freq: Once | INTRAMUSCULAR | Status: AC
  Administered 2017-03-11: 30 mg via INTRAVENOUS

## 2017-03-11 MED ORDER — CEFAZOLIN SODIUM-DEXTROSE 2-4 GM/100ML-% IV SOLN
2.0000 g | INTRAVENOUS | Status: AC
  Administered 2017-03-11: 2 g via INTRAVENOUS
  Filled 2017-03-11: qty 100

## 2017-03-11 MED ORDER — KETOROLAC TROMETHAMINE 30 MG/ML IJ SOLN
INTRAMUSCULAR | Status: AC
  Filled 2017-03-11: qty 1

## 2017-03-11 MED ORDER — HEPARIN SOD (PORK) LOCK FLUSH 100 UNIT/ML IV SOLN
INTRAVENOUS | Status: DC | PRN
  Administered 2017-03-11: 500 [IU] via INTRAVENOUS

## 2017-03-11 MED ORDER — PROPOFOL 10 MG/ML IV BOLUS
INTRAVENOUS | Status: AC
  Filled 2017-03-11: qty 20

## 2017-03-11 MED ORDER — PROPOFOL 500 MG/50ML IV EMUL
INTRAVENOUS | Status: DC | PRN
  Administered 2017-03-11: 50 ug/kg/min via INTRAVENOUS

## 2017-03-11 MED ORDER — LIDOCAINE HCL 1 % IJ SOLN
INTRAMUSCULAR | Status: DC | PRN
  Administered 2017-03-11: 10 mg via INTRADERMAL

## 2017-03-11 MED ORDER — LIDOCAINE HCL (PF) 1 % IJ SOLN
INTRAMUSCULAR | Status: AC
Start: 2017-03-11 — End: ?
  Filled 2017-03-11: qty 30

## 2017-03-11 MED ORDER — MIDAZOLAM HCL 2 MG/2ML IJ SOLN
INTRAMUSCULAR | Status: AC
  Filled 2017-03-11: qty 2

## 2017-03-11 SURGICAL SUPPLY — 37 items
ADH SKN CLS APL DERMABOND .7 (GAUZE/BANDAGES/DRESSINGS) ×1
APPLIER CLIP 9.375 SM OPEN (CLIP)
APR CLP SM 9.3 20 MLT OPN (CLIP)
BAG DECANTER FOR FLEXI CONT (MISCELLANEOUS) ×3 IMPLANT
BAG HAMPER (MISCELLANEOUS) ×3 IMPLANT
CATH HICKMAN DUAL 12.0 (CATHETERS) IMPLANT
CHLORAPREP W/TINT 10.5 ML (MISCELLANEOUS) ×1 IMPLANT
CLIP APPLIE 9.375 SM OPEN (CLIP) IMPLANT
CLOTH BEACON ORANGE TIMEOUT ST (SAFETY) ×3 IMPLANT
COVER LIGHT HANDLE STERIS (MISCELLANEOUS) ×6 IMPLANT
DECANTER SPIKE VIAL GLASS SM (MISCELLANEOUS) ×3 IMPLANT
DERMABOND ADVANCED (GAUZE/BANDAGES/DRESSINGS) ×2
DERMABOND ADVANCED .7 DNX12 (GAUZE/BANDAGES/DRESSINGS) ×1 IMPLANT
DRAPE C-ARM FOLDED MOBILE STRL (DRAPES) ×3 IMPLANT
DURAPREP 6ML APPLICATOR 50/CS (WOUND CARE) ×2 IMPLANT
ELECT REM PT RETURN 9FT ADLT (ELECTROSURGICAL) ×3
ELECTRODE REM PT RTRN 9FT ADLT (ELECTROSURGICAL) ×1 IMPLANT
GLOVE BIOGEL PI IND STRL 7.0 (GLOVE) ×1 IMPLANT
GLOVE BIOGEL PI INDICATOR 7.0 (GLOVE) ×4
GLOVE SURG SS PI 7.5 STRL IVOR (GLOVE) ×3 IMPLANT
GOWN STRL REUS W/TWL LRG LVL3 (GOWN DISPOSABLE) ×6 IMPLANT
IV NS 500ML (IV SOLUTION) ×3
IV NS 500ML BAXH (IV SOLUTION) ×1 IMPLANT
KIT PORT POWER 8FR ISP MRI (Port) ×3 IMPLANT
KIT TURNOVER KIT A (KITS) ×3 IMPLANT
MANIFOLD NEPTUNE II (INSTRUMENTS) ×3 IMPLANT
NDL HYPO 25X1 1.5 SAFETY (NEEDLE) ×1 IMPLANT
NEEDLE HYPO 25X1 1.5 SAFETY (NEEDLE) ×3 IMPLANT
PACK MINOR (CUSTOM PROCEDURE TRAY) ×3 IMPLANT
PAD ARMBOARD 7.5X6 YLW CONV (MISCELLANEOUS) ×3 IMPLANT
SET BASIN LINEN APH (SET/KITS/TRAYS/PACK) ×3 IMPLANT
SET INTRODUCER 12FR PACEMAKER (SHEATH) IMPLANT
SUT MNCRL AB 4-0 PS2 18 (SUTURE) ×3 IMPLANT
SUT VIC AB 3-0 SH 27 (SUTURE) ×3
SUT VIC AB 3-0 SH 27X BRD (SUTURE) ×1 IMPLANT
SYR 20CC LL (SYRINGE) ×3 IMPLANT
SYR CONTROL 10ML LL (SYRINGE) ×3 IMPLANT

## 2017-03-11 NOTE — Op Note (Signed)
Patient:  Margaret Thompson  DOB:  October 02, 1933  MRN:  [id number]   Preop Diagnosis: Recurrent left breast cancer  Postop Diagnosis: Same  Procedure: Port-A-Cath insertion  Surgeon: Aviva Signs, MD  Anes: MAC  Indications: Patient is an 84 year old white female status post left simple mastectomy for recurrent left breast cancer now presents for Port-A-Cath insertion.  The risks and benefits of the procedure including bleeding, infection, and pneumothorax were fully explained to the patient, who gave informed consent.  Procedure note: The patient was placed in the Trendelenburg position after the right upper chest was prepped and draped using the usual sterile technique with DuraPrep.  Surgical site confirmation was performed.  1% Xylocaine was used for local anesthesia.  Incision was made through the previous Port-A-Cath surgical scar.  The subcutaneous pocket was then formed.  The needle was advanced into the right subclavian vein using the Seldinger technique without difficulty.  A guidewire was then advanced into the right atrium under fluoroscopic guidance.  An introducer and peel-away sheath were placed over the guidewire.  The catheter was then inserted through the peel-away sheath and the peel-away sheath was removed.  The catheter was then attached to the port and the port placed in subcutaneous pocket.  Adequate positioning was confirmed by fluoroscopy.  Good backflow of blood was noted on aspiration of the port.  Port was flushed with heparin flush.  The subcutaneous area was reapproximated using a 3-0 Vicryl interrupted suture.  The skin was closed using a 4-0 Monocryl subcuticular suture.  Dermabond was applied.  All tape and needle counts were correct at the end of the procedure.  The patient was transferred to PACU in stable condition.  A chest x-ray will be performed at that time.  Complications: None  EBL: Minimal  Specimen: None

## 2017-03-11 NOTE — Progress Notes (Signed)
Called Dr. Thornton Papas - Radiology to check placement of port .  Read back results - Tip over SVC, no pneumothorax noted.

## 2017-03-11 NOTE — Transfer of Care (Signed)
Immediate Anesthesia Transfer of Care Note  Patient: Margaret Thompson  Procedure(s) Performed: INSERTION PORT-A-CATH (Right Chest)  Patient Location: PACU  Anesthesia Type:MAC  Level of Consciousness: awake  Airway & Oxygen Therapy: Patient Spontanous Breathing  Post-op Assessment: Report given to RN  Post vital signs: Reviewed and stable  Last Vitals:  Vitals:   03/11/17 0815 03/11/17 0830  BP:    Resp: 16 12  Temp:    SpO2: 99% 100%    Last Pain:  Vitals:   03/11/17 0712  TempSrc: Oral      Patients Stated Pain Goal: 5 (97/02/63 7858)  Complications: No apparent anesthesia complications

## 2017-03-11 NOTE — Interval H&P Note (Signed)
History and Physical Interval Note:  03/11/2017 8:01 AM  Margaret Thompson  has presented today for surgery, with the diagnosis of left breast cancer  The various methods of treatment have been discussed with the patient and family. After consideration of risks, benefits and other options for treatment, the patient has consented to  Procedure(s): INSERTION PORT-A-CATH (Right) as a surgical intervention .  The patient's history has been reviewed, patient examined, no change in status, stable for surgery.  I have reviewed the patient's chart and labs.  Questions were answered to the patient's satisfaction.     Aviva Signs

## 2017-03-11 NOTE — Anesthesia Postprocedure Evaluation (Signed)
Anesthesia Post Note  Patient: Margaret Thompson  Procedure(s) Performed: INSERTION PORT-A-CATH (Right Chest)  Patient location during evaluation: PACU Anesthesia Type: MAC Level of consciousness: awake and alert and oriented Pain management: pain level controlled Vital Signs Assessment: post-procedure vital signs reviewed and stable Respiratory status: spontaneous breathing Cardiovascular status: stable Postop Assessment: no apparent nausea or vomiting Anesthetic complications: no     Last Vitals:  Vitals:   03/11/17 0830 03/11/17 0915  BP:  (!) 130/58  Pulse:  94  Resp: 12 16  Temp:  36.9 C  SpO2: 100% 100%    Last Pain:  Vitals:   03/11/17 0712  TempSrc: Oral                 Dravon Nott

## 2017-03-11 NOTE — Anesthesia Preprocedure Evaluation (Addendum)
Anesthesia Evaluation  Patient identified by MRN, date of birth, ID band Patient awake    History of Anesthesia Complications (+) PONV  Airway   TM Distance: >3 FB     Dental  (+) Edentulous Upper, Poor Dentition   Pulmonary Current Smoker,    Pulmonary exam normal        Cardiovascular METS: 5 - 7 Mets hypertension, Pt. on medications + Past MI  Normal cardiovascular exam Rhythm:Regular Rate:Normal  Currently has stent, no CP/SOB and has not ever needed NTG.  12-Jan-2017 14:27:40 [facility]-300 ROUTINE RECORD Normal sinus rhythm Left axis deviation  DISCHARGE DIAGNOSES:   (2014) 1. Status post acute inferoposterior wall myocardial infarction,     status post emergency percutaneous transluminal coronary     angioplasty and stenting to 100% occluded right coronary    Neuro/Psych    GI/Hepatic GERD  Medicated,  Endo/Other    Renal/GU FEB-2019 09:01:39 [facility]-300 ROUTINE RECORD Sinus rhythm with 1st degree A-V block Otherwise normal ECG     Musculoskeletal   Abdominal (+)  Abdomen: soft.    Peds  Hematology  (+) anemia , Results for ARRIELLE, MCGINN (MRN [id number]) as of 03/11/2017 07:30  01/17/2017 05:30 WBC: 12.0 (H) RBC: 3.76 (L) Hemoglobin: 11.4 (L) HCT: 36.2 MCV: 96.3 MCH: 30.3 MCHC: 31.5 RDW: 12.8 Platelets: 264    Anesthesia Other Findings   Reproductive/Obstetrics                             Anesthesia Physical Anesthesia Plan  ASA: IV  Anesthesia Plan: MAC   Post-op Pain Management:    Induction:   PONV Risk Score and Plan:   Airway Management Planned: Simple Face Mask  Additional Equipment:   Intra-op Plan:   Post-operative Plan:   Informed Consent: I have reviewed the patients History and Physical, chart, labs and discussed the procedure including the risks, benefits and alternatives for the proposed anesthesia  with the patient or authorized representative who has indicated his/her understanding and acceptance.   Dental advisory given  Plan Discussed with: CRNA and Surgeon  Anesthesia Plan Comments:         Anesthesia Quick Evaluation

## 2017-03-11 NOTE — Discharge Instructions (Signed)
PATIENT INSTRUCTIONS POST-ANESTHESIA  IMMEDIATELY FOLLOWING SURGERY:  Do not drive or operate machinery for the first twenty four hours after surgery.  Do not make any important decisions for twenty four hours after surgery or while taking narcotic pain medications or sedatives.  If you develop intractable nausea and vomiting or a severe headache please notify your doctor immediately.  FOLLOW-UP:  Please make an appointment with your surgeon as instructed. You do not need to follow up with anesthesia unless specifically instructed to do so.  WOUND CARE INSTRUCTIONS (if applicable):  Keep a dry clean dressing on the anesthesia/puncture wound site if there is drainage.  Once the wound has quit draining you may leave it open to air.  Generally you should leave the bandage intact for twenty four hours unless there is drainage.  If the epidural site drains for more than 36-48 hours please call the anesthesia department.  QUESTIONS?:  Please feel free to call your physician or the hospital operator if you have any questions, and they will be happy to assist you.       Implanted [facility] Guide An implanted port is a type of central line that is placed under the skin. Central lines are used to provide IV access when treatment or nutrition needs to be given through a persons veins. Implanted ports are used for long-term IV access. An implanted port may be placed because:  You need IV medicine that would be irritating to the small veins in your hands or arms.  You need long-term IV medicines, such as antibiotics.  You need IV nutrition for a long period.  You need frequent blood draws for lab tests.  You need dialysis.  Implanted ports are usually placed in the chest area, but they can also be placed in the upper arm, the abdomen, or the leg. An implanted port has two main parts:  Reservoir. The reservoir is round and will appear as a small, raised area under your skin. The reservoir is the part  where a needle is inserted to give medicines or draw blood.  Catheter. The catheter is a thin, flexible tube that extends from the reservoir. The catheter is placed into a large vein. Medicine that is inserted into the reservoir goes into the catheter and then into the vein.  How will I care for my incision site? Do not get the incision site wet. Bathe or shower as directed by your health care provider. How is my port accessed? Special steps must be taken to access the port:  Before the port is accessed, a numbing cream can be placed on the skin. This helps numb the skin over the port site.  Your health care provider uses a sterile technique to access the port. ? Your health care provider must put on a mask and sterile gloves. ? The skin over your port is cleaned carefully with an antiseptic and allowed to dry. ? The port is gently pinched between sterile gloves, and a needle is inserted into the port.  Only "non-coring" port needles should be used to access the port. Once the port is accessed, a blood return should be checked. This helps ensure that the port is in the vein and is not clogged.  If your port needs to remain accessed for a constant infusion, a clear (transparent) bandage will be placed over the needle site. The bandage and needle will need to be changed every week, or as directed by your health care provider.  Keep the bandage  covering the needle clean and dry. Do not get it wet. Follow your health care providers instructions on how to take a shower or bath while the port is accessed.  If your port does not need to stay accessed, no bandage is needed over the port.  What is flushing? Flushing helps keep the port from getting clogged. Follow your health care providers instructions on how and when to flush the port. Ports are usually flushed with saline solution or a medicine called heparin. The need for flushing will depend on how the port is used.  If the port is used for  intermittent medicines or blood draws, the port will need to be flushed: ? After medicines have been given. ? After blood has been drawn. ? As part of routine maintenance.  If a constant infusion is running, the port may not need to be flushed.  How long will my port stay implanted? The port can stay in for as long as your health care provider thinks it is needed. When it is time for the port to come out, surgery will be done to remove it. The procedure is similar to the one performed when the port was put in. When should I seek immediate medical care? When you have an implanted port, you should seek immediate medical care if:  You notice a bad smell coming from the incision site.  You have swelling, redness, or drainage at the incision site.  You have more swelling or pain at the port site or the surrounding area.  You have a fever that is not controlled with medicine.  This information is not intended to replace advice given to you by your health care provider. Make sure you discuss any questions you have with your health care provider. Document Released: 12/30/2004 Document Revised: 06/07/2015 Document Reviewed: 09/06/2012 Elsevier Interactive Patient Education  2017 [employer].

## 2017-03-12 ENCOUNTER — Encounter (HOSPITAL_COMMUNITY): Payer: Self-pay | Admitting: General Surgery

## 2017-03-12 ENCOUNTER — Telehealth (HOSPITAL_COMMUNITY): Payer: Self-pay | Admitting: Emergency Medicine

## 2017-03-12 DIAGNOSIS — C50312 Malignant neoplasm of lower-inner quadrant of left female breast: Secondary | ICD-10-CM

## 2017-03-12 MED ORDER — DEXAMETHASONE 4 MG PO TABS
8.0000 mg | ORAL_TABLET | Freq: Two times a day (BID) | ORAL | 1 refills | Status: DC
Start: 2017-03-12 — End: 2017-06-04

## 2017-03-12 MED ORDER — ONDANSETRON HCL 8 MG PO TABS: 8.0000 mg | ORAL_TABLET | Freq: Two times a day (BID) | ORAL | 1 refills | Status: DC | PRN

## 2017-03-12 MED ORDER — PROCHLORPERAZINE MALEATE 10 MG PO TABS: 10.0000 mg | ORAL_TABLET | Freq: Four times a day (QID) | ORAL | 1 refills | Status: DC | PRN

## 2017-03-12 NOTE — Telephone Encounter (Signed)
Pt called and wanted to know why she could not take the chemo pill.  She states that her son had cancer and they removed his tumor and he was able to take a pill.  Explained that her cancer is breast cancer.  And even this breast cancer is different then her previous breast cancer.  Depending on stage, what type of cancer you have, age, and health of the person all determines what type of chemo a person could get.  We follow the NCCN guidelines as to what it recommends for each type of cancer. She verbalized understanding.  We went over how she was suppose to take her dexamethasone again.

## 2017-03-13 ENCOUNTER — Inpatient Hospital Stay (HOSPITAL_COMMUNITY): Payer: Medicare Other | Attending: Internal Medicine

## 2017-03-13 ENCOUNTER — Inpatient Hospital Stay (HOSPITAL_BASED_OUTPATIENT_CLINIC_OR_DEPARTMENT_OTHER): Payer: Medicare Other | Admitting: Internal Medicine

## 2017-03-13 ENCOUNTER — Encounter (HOSPITAL_COMMUNITY): Payer: Self-pay | Admitting: Internal Medicine

## 2017-03-13 VITALS — BP 132/69 | HR 100 | Temp 98.1°F | Resp 18 | Ht 61.0 in | Wt 125.4 lb

## 2017-03-13 DIAGNOSIS — R911 Solitary pulmonary nodule: Secondary | ICD-10-CM

## 2017-03-13 DIAGNOSIS — I10 Essential (primary) hypertension: Secondary | ICD-10-CM | POA: Diagnosis not present

## 2017-03-13 DIAGNOSIS — Z171 Estrogen receptor negative status [ER-]: Secondary | ICD-10-CM | POA: Insufficient documentation

## 2017-03-13 DIAGNOSIS — R51 Headache: Secondary | ICD-10-CM | POA: Diagnosis not present

## 2017-03-13 DIAGNOSIS — Z452 Encounter for adjustment and management of vascular access device: Secondary | ICD-10-CM | POA: Insufficient documentation

## 2017-03-13 DIAGNOSIS — C50312 Malignant neoplasm of lower-inner quadrant of left female breast: Secondary | ICD-10-CM | POA: Insufficient documentation

## 2017-03-13 LAB — CBC WITH DIFFERENTIAL/PLATELET
BASOS ABS: 0 10*3/uL (ref 0.0–0.1)
Basophils Relative: 0 %
EOS PCT: 0 %
Eosinophils Absolute: 0 10*3/uL (ref 0.0–0.7)
HCT: 37.3 % (ref 36.0–46.0)
Hemoglobin: 12 g/dL (ref 12.0–15.0)
LYMPHS PCT: 9 %
Lymphs Abs: 0.8 10*3/uL (ref 0.7–4.0)
MCH: 30.2 pg (ref 26.0–34.0)
MCHC: 32.2 g/dL (ref 30.0–36.0)
MCV: 94 fL (ref 78.0–100.0)
Monocytes Absolute: 0.3 10*3/uL (ref 0.1–1.0)
Monocytes Relative: 3 %
Neutro Abs: 7.6 10*3/uL (ref 1.7–7.7)
Neutrophils Relative %: 88 %
PLATELETS: 234 10*3/uL (ref 150–400)
RBC: 3.97 MIL/uL (ref 3.87–5.11)
RDW: 12.6 % (ref 11.5–15.5)
WBC: 8.6 10*3/uL (ref 4.0–10.5)

## 2017-03-13 LAB — COMPREHENSIVE METABOLIC PANEL
ALBUMIN: 3.5 g/dL (ref 3.5–5.0)
ALK PHOS: 70 U/L (ref 38–126)
ALT: 10 U/L — AB (ref 14–54)
AST: 20 U/L (ref 15–41)
Anion gap: 12 (ref 5–15)
BILIRUBIN TOTAL: 0.5 mg/dL (ref 0.3–1.2)
BUN: 13 mg/dL (ref 6–20)
CO2: 24 mmol/L (ref 22–32)
Calcium: 9.3 mg/dL (ref 8.9–10.3)
Chloride: 102 mmol/L (ref 101–111)
Creatinine, Ser: 0.86 mg/dL (ref 0.44–1.00)
GFR calc Af Amer: >60 mL/min (ref >60)
GFR calc non Af Amer: >60 mL/min (ref >60)
GLUCOSE: 192 mg/dL — AB (ref 65–99)
POTASSIUM: 3.3 mmol/L — AB (ref 3.5–5.1)
Sodium: 138 mmol/L (ref 135–145)
TOTAL PROTEIN: 7.3 g/dL (ref 6.5–8.1)

## 2017-03-13 MED ORDER — HEPARIN SOD (PORK) LOCK FLUSH 100 UNIT/ML IV SOLN
500.0000 [IU] | Freq: Once | INTRAVENOUS | Status: AC
Start: 2017-03-13 — End: 2017-03-13
  Administered 2017-03-13: 500 [IU] via INTRAVENOUS

## 2017-03-13 NOTE — Progress Notes (Signed)
No treatment today per MD.  

## 2017-03-13 NOTE — Progress Notes (Signed)
Diagnosis Malignant neoplasm of lower-inner quadrant of left female breast, unspecified estrogen receptor status ([place]) - Plan: NM PET Image Initial (PI) Skull Base To Thigh  Pulmonary nodule - Plan: NM PET Image Initial (PI) Skull Base To Thigh  Staging Cancer Staging History of left breast cancer (2003) Staging form: Breast, AJCC 7th Edition - Clinical: Stage IIA (T1c, N1, cM0) - Signed by Baird Cancer, PA-C on 11/23/2012  Malignant neoplasm of lower-inner quadrant of left female breast [facility]) Staging form: Breast, AJCC 8th Edition - Pathologic stage from 01/19/2017: Stage Unknown (pT1c, pNX, cM0, G3, ER: Negative, PR: Negative, HER2: Negative) - Signed by Baird Cancer, PA-C on 02/27/2017   Assessment and Plan: 1.  T1CN0 left breast cancer triple negative.  Pt was originally diagnosed with Stage IIA ER/PR+, HER2 neg (T1 C. N1 M0) adenocarcinoma of the left breast with surgery on 03/08/2001. She had a 1.5 cm cancer with 1/6 positive sentinel nodes. Reexcision margins were clear. She did participate in NSABP B.-30 randomized to Adriamycin and Taxotere for 4 cycles followed by radiation therapy from 07/13/2001 to 08/27/2001. She then started Arimidex on 07/07/2001 took that until the end of June 2008. She has been thus far without a recurrence.  Mammogram in July 2018 demonstrated new 1.2 x 0.8x 0.8 cm biopsy proven to be ER/PR negative, HER2 negative IDC with DCIS.  She was seen by Dr. Talbert Cage who discussed that  triple negative breast cancers are aggressive and she was recommended for adjuvant chemotherapy since her tumor is greater than 1 cm and triple negative and she was planned for TC for 4 cycles.  She has undergone left simple mastectomy on 01/16/2017 with pathology returning as invasive ductal carcinoma grade 3 measuring 1.8 cm.  She had evidence of DCIS grade 3 lymphovascular invasion was seen.  Margins were negative for cancer.  Tumor was ER PR HER-2 negative.  Ki-67 was 30%.  She  was here today for cycle 1 of TC.  Review of her chart showed she last had imaging 04/23/2012 of the chest without contrast that showed:     IMPRESSION: 1.  Postoperative changes of prior left breast lumpectomy, axillary nodal dissection, and postradiation changes in the anterolateral aspect of the left upper lobe related to prior left-sided breast radiation therapy.  No definite signs of local recurrence of disease or metastatic disease to the thorax on today's examination. 2.  There is one small 3 mm nodule in the superior segment of the left lower lobe which is highly nonspecific but does warrant attention on future follow up examinations given the patient's history of breast cancer and smoking.  A 1 year followup CT scan is recommended at this time.  Patient reports she was unaware of these findings.  Due to the new left  breast cancer she will be set up for PET scan for restaging evaluation and for further evaluation of the pulmonary nodule.  She will return to clinic in 7-10 days to go over the results of her imaging.  She will plan to proceed with therapy once her imaging has been reviewed.  Chemotherapy is held today pending repeat imaging.  2.  Smoking.  This patient has a long smoking history.  She had a CT chest scan done April 23, 2012 that showed a small 3 mm nodule in the left lower lobe that was nonspecific with follow-up imaging recommended.  She will be set up for PET scan for annual evaluation of the pulmonary nodule.  3.  Hypertension blood pressure is 132/69.  She should continue to follow-up with her primary care physician.  4.  Sinus headache.  Blood pressure is 132/69.  She is recommended for Zyrtec or over-the-counter antihistamines.  Pending the results of her PET scan additional imaging may be recommended.   INTERVAL HISTORY: 84 y.o. female returns for followup of Stage IIA ER/PR+, HER2 neg (T1 C. N1 M0) adenocarcinoma of the left breast with surgery on 03/08/2001. She  had a 1.5 cm cancer with 1/6 positive sentinel nodes. Reexcision margins were clear. She did participate in NSABP B.-30 randomized to Adriamycin and Taxotere for 4 cycles followed by radiation therapy from 07/13/2001 to 08/27/2001. She then started Arimidex on 07/07/2001 took that until the end of June 2008.   She underwent a screening bilateral mammogram on 07/28/2016 which demonstrated in the left breast a possible asymmetry with calcification. She then underwent a left diagnostic mammogram on 08/12/2016 targeted ultrasound which demonstrated a hypoechoic irregular mass with indistinct margins at 6:30 position 1 cm from the nipple measuring 1.2 x 1.8 x 0.8 cm. Ultrasound of the left axilla is negative for lymphadenopathy. Patient underwent biopsy of the left breast mass on 08/20/2016 with prognostic panel ER negative, PR negative, HER-2 negative by FISH, invasive ductal carcinoma with DCIS. Patient did not palpate any breast masses herself.  Patient was seen by Dr. Arnoldo Morale and underwent left simple mastectomy on 01/16/2017 with pathology returning as invasive ductal carcinoma grade 3 measuring 1.8 cm.  She had evidence of DCIS grade 3 lymphovascular invasion was seen.  Margins were negative for cancer.  Tumor was ER PR HER-2 negative.  Ki-67 was 30%.    Malignant neoplasm of lower-inner quadrant of left female breast ([place])   08/21/2016 Pathology Results    Left needle core biopsy, lower-inner quadrant: invasive ductal carcinoma and DCIS.  ER/PR-, HER2-.       01/16/2017 Surgery    Left simple mastectomy by Dr. Alean Rinne ductal carcinoma, grade 3, measuring 1.8 cm with DCIS (high-grade).  Lymphovascular invasion is identified.  Clear surgical margins.       Problem List Patient Active Problem List   Diagnosis Date Noted  . Malignant neoplasm of lower-inner quadrant of left female breast ([place]) [C50.312]   . Acute MI, inferoposterior wall ([place]) [I21.19] 12/21/2012  . History of left breast  cancer (2003) [Z85.3] 11/23/2012  . Bursitis, shoulder [M75.50] 05/25/2012    Past Medical History Past Medical History:  Diagnosis Date  . Acute MI ([place]) 2003  . Adenocarcinoma of left breast ([place]) 11/23/2012   Stage II (T1 C. N1 M0) adenocarcinoma of the left breast with surgery on 03/08/2001. She had a 1.5 cm cancer with 1/6 positive sentinel nodes. Reexcision margins were clear. She did participate in NSABP B.-30 randomized to Adriamycin and Taxotere for 4 cycles followed by radiation therapy from 07/13/2001 to 08/27/2001. She then started Arimidex on 07/07/2001 took that until the end of June 2008. T  . Anemia   . Bilateral cataracts   . Breast cancer ([place])   . GERD (gastroesophageal reflux disease)   . History of gout   . Hypercholesteremia   . Hypertension   . Malignant neoplasm of lower-inner quadrant of left female breast ([place])   . Osteoporosis     Past Surgical History Past Surgical History:  Procedure Laterality Date  . ABDOMINAL HYSTERECTOMY    . BREAST SURGERY    . CATARACT EXTRACTION W/PHACO Left 09/04/2014   Procedure: CATARACT EXTRACTION PHACO AND INTRAOCULAR LENS  PLACEMENT (IOC);  Surgeon: Tonny Branch, MD;  Location: [facility];  Service: Ophthalmology;  Laterality: Left;  CDE:9.84  . CATARACT EXTRACTION W/PHACO Right 11/09/2014   Procedure: CATARACT EXTRACTION PHACO AND INTRAOCULAR LENS PLACEMENT (IOC);  Surgeon: Tonny Branch, MD;  Location: [facility];  Service: Ophthalmology;  Laterality: Right;  CDE:8.40  . CORONARY ANGIOPLASTY     RCA stent  . LEFT HEART CATHETERIZATION WITH CORONARY ANGIOGRAM N/A 12/21/2012   Procedure: LEFT HEART CATHETERIZATION WITH CORONARY ANGIOGRAM;  Surgeon: Clent Demark, MD;  Location: [facility] C[facility];  Service: Cardiovascular;  Laterality: N/A;  . MASTECTOMY MODIFIED RADICAL Left 01/16/2017   Procedure: SIMPLE MASTECTOMY;  Surgeon: Aviva Signs, MD;  Location: [facility];  Service: General;  Laterality: Left;  Marland Kitchen MASTECTOMY, PARTIAL Left   .  PERCUTANEOUS STENT INTERVENTION     RCA  . PORT-A-CATH REMOVAL    . PORTACATH PLACEMENT    . PORTACATH PLACEMENT Right 03/11/2017   Procedure: INSERTION PORT-A-CATH;  Surgeon: Aviva Signs, MD;  Location: [facility];  Service: General;  Laterality: Right;    Family History Family History  Problem Relation Age of Onset  . Stroke Mother      Social History  reports that she has been smoking cigarettes.  She has a 25.00 pack-year smoking history. she has never used smokeless tobacco. She reports that she does not drink alcohol or use drugs.  Medications  Current Outpatient Medications:  .  Artificial Tear Solution (SOOTHE XP OP), Apply 2 drops to eye as needed (dry eye)., Disp: , Rfl:  .  aspirin EC 81 MG EC tablet, Take 1 tablet (81 mg total) by mouth daily., Disp: 30 tablet, Rfl: 3 .  atorvastatin (LIPITOR) 40 MG tablet, Take 40 mg by mouth daily at 6 PM. , Disp: , Rfl:  .  CARTIA XT 180 MG 24 hr capsule, Take 180 mg by mouth daily., Disp: , Rfl:  .  cyclophosphamide (CYTOXAN) 2 g chemo injection, Inject into the vein once. Every 3 weeks for 4 cycles, Disp: , Rfl:  .  dexamethasone (DECADRON) 4 MG tablet, Take 2 tablets (8 mg total) by mouth 2 (two) times daily. Start the day before Taxotere. Then again the day after chemo for 3 days., Disp: 30 tablet, Rfl: 1 .  DOCEtaxel (TAXOTERE IV), Inject into the vein. Every 3 weeks for 4 cycles, Disp: , Rfl:  .  nitroGLYCERIN (NITROSTAT) 0.4 MG SL tablet, Place 1 tablet (0.4 mg total) under the tongue every 5 (five) minutes x 3 doses as needed for chest pain. (Patient taking differently: Place 0.4 mg under the tongue every 5 (five) minutes as needed for chest pain. ), Disp: 25 tablet, Rfl: 12 .  ondansetron (ZOFRAN) 8 MG tablet, Take 1 tablet (8 mg total) by mouth 2 (two) times daily as needed for refractory nausea / vomiting. Start on day 3 after chemo., Disp: 30 tablet, Rfl: 1 .  Pegfilgrastim (NEULASTA ONPRO [place]), Inject into the skin. Every 3 weeks  for 4 cycles, Disp: , Rfl:  .  prochlorperazine (COMPAZINE) 10 MG tablet, Take 1 tablet (10 mg total) by mouth every 6 (six) hours as needed (Nausea or vomiting)., Disp: 30 tablet, Rfl: 1 .  ranitidine (ZANTAC) 150 MG tablet, Take 75 mg by mouth as needed for heartburn. , Disp: , Rfl:   Allergies Azithromycin  Review of Systems Review of Systems - Oncology ROS as per HPI otherwise 12 point ROS is negative.   Physical Exam  Vitals Wt Readings  from Last 3 Encounters:  03/13/17 125 lb 6.4 oz (56.9 kg)  02/27/17 124 lb 14.4 oz (56.7 kg)  01/27/17 126 lb (57.2 kg)   Temp Readings from Last 3 Encounters:  03/13/17 98.1 F (36.7 C) (Oral)  03/11/17 97.6 F (36.4 C) (Oral)  02/27/17 97.6 F (36.4 C) (Oral)   BP Readings from Last 3 Encounters:  03/13/17 132/69  03/11/17 (!) 156/91  02/27/17 129/86   Pulse Readings from Last 3 Encounters:  03/13/17 100  03/11/17 90  02/27/17 96   Constitutional: Well-developed, well-nourished, and in no distress.   HENT: Head: Normocephalic and atraumatic.  Mouth/Throat: No oropharyngeal exudate. Mucosa moist. Eyes: Pupils are equal, round, and reactive to light. Conjunctivae are normal. No scleral icterus.  Neck: Normal range of motion. Neck supple. No JVD present.  Cardiovascular: Normal rate, regular rhythm and normal heart sounds.  Exam reveals no gallop and no friction rub.   No murmur heard. Pulmonary/Chest: Effort normal and breath sounds normal. No respiratory distress. No wheezes.No rales.  Abdominal: Soft. Bowel sounds are normal. No distension. There is no tenderness. There is no guarding.  Musculoskeletal: No edema or tenderness.  Lymphadenopathy: No cervical, axillary or supraclavicular adenopathy.  Neurological: Alert and oriented to person, place, and time. No cranial nerve deficit.  Skin: Skin is warm and dry. No rash noted. No erythema. No pallor.  Psychiatric: Affect and judgment normal.  Bilateral mastectomy:  Left  mastectomy site healed well no signs of recurrence.  Right breast shows no dominant masses, no nipple discharge no retractions.  Labs Infusion on 03/13/2017  Component Date Value Ref Range Status  . Sodium 03/13/2017 138  135 - 145 mmol/L Final  . Potassium 03/13/2017 3.3* 3.5 - 5.1 mmol/L Final  . Chloride 03/13/2017 102  101 - 111 mmol/L Final  . CO2 03/13/2017 24  22 - 32 mmol/L Final  . Glucose, Bld 03/13/2017 192* 65 - 99 mg/dL Final  . BUN 03/13/2017 13  6 - 20 mg/dL Final  . Creatinine, Ser 03/13/2017 0.86  0.44 - 1.00 mg/dL Final  . Calcium 03/13/2017 9.3  8.9 - 10.3 mg/dL Final  . Total Protein 03/13/2017 7.3  6.5 - 8.1 g/dL Final  . Albumin 03/13/2017 3.5  3.5 - 5.0 g/dL Final  . AST 03/13/2017 20  15 - 41 U/L Final  . ALT 03/13/2017 10* 14 - 54 U/L Final  . Alkaline Phosphatase 03/13/2017 70  38 - 126 U/L Final  . Total Bilirubin 03/13/2017 0.5  0.3 - 1.2 mg/dL Final  . GFR calc non Af Amer 03/13/2017 >60  >60 mL/min Final  . GFR calc Af Amer 03/13/2017 >60  >60 mL/min Final   Comment: (NOTE) The eGFR has been calculated using the CKD EPI equation. This calculation has not been validated in all clinical situations. eGFR's persistently <60 mL/min signify possible Chronic Kidney Disease.   Georgiann Hahn gap 03/13/2017 12  5 - 15 Final   Performed at [facility], [address] [place], [place] [address]  . WBC 03/13/2017 8.6  4.0 - 10.5 K/uL Final  . RBC 03/13/2017 3.97  3.87 - 5.11 MIL/uL Final  . Hemoglobin 03/13/2017 12.0  12.0 - 15.0 g/dL Final  . HCT 03/13/2017 37.3  36.0 - 46.0 % Final  . MCV 03/13/2017 94.0  78.0 - 100.0 fL Final  . MCH 03/13/2017 30.2  26.0 - 34.0 pg Final  . MCHC 03/13/2017 32.2  30.0 - 36.0 g/dL Final  . RDW 03/13/2017 12.6  11.5 - 15.5 % Final  . Platelets 03/13/2017 234  150 - 400 K/uL Final  . Neutrophils Relative % 03/13/2017 88  % Final  . Neutro Abs 03/13/2017 7.6  1.7 - 7.7 K/uL Final  . Lymphocytes Relative 03/13/2017 9  % Final  .  Lymphs Abs 03/13/2017 0.8  0.7 - 4.0 K/uL Final  . Monocytes Relative 03/13/2017 3  % Final  . Monocytes Absolute 03/13/2017 0.3  0.1 - 1.0 K/uL Final  . Eosinophils Relative 03/13/2017 0  % Final  . Eosinophils Absolute 03/13/2017 0.0  0.0 - 0.7 K/uL Final  . Basophils Relative 03/13/2017 0  % Final  . Basophils Absolute 03/13/2017 0.0  0.0 - 0.1 K/uL Final   Performed at [facility], [address], [place], [place] [address]     Pathology:  Breast, simple mastectomy, left done 01/16/2017:   - INVASIVE DUCTAL CARCINOMA, GRADE III/III, SPANNING 1.8 CM. - DUCTAL CARCINOMA IN SITU, HIGH GRADE. - LYMPHOVASCULAR INVASION IS IDENTIFIED. - THE SURGICAL RESECTION MARGINS ARE NEGATIVE FOR CARCINOMA. - SEE ONCOLOGY TABLE BELOW. Microscopic Comment BREAST, INVASIVE TUMOR Procedure: Simple mastectomy. Laterality: Left. Tumor Size: 1.8 cm. Histologic Type: Ductal Grade: III. Tubular Differentiation: 2. Nuclear Pleomorphism: 3. Mitotic Count: 3. Ductal Carcinoma in Situ (DCIS): Present, high grade. Extent of Tumor: Confined to breast parenchyma. Margins: Greater than 0.2 cm from all margins. Regional Lymph Nodes: None examined. Breast Prognostic Profile: Case [id number]. Estrogen Receptor: 0%. Progesterone Receptor: 0%. Her2: No amplification was detected. Ki-67: 30%. Best tumor block for sendout testing: 1A. Pathologic Stage Classification (pTNM, AJCC 8th Edition): Primary Tumor (pT): pT1c. Regional Lymph Nodes (pN): pN0. Distant Metastases (pM): pMX. Comments: In addition to the above, there are vascular calcifications  CT chest done 04/26/2012:  IMPRESSION: 1.  Postoperative changes of prior left breast lumpectomy, axillary nodal dissection, and postradiation changes in the anterolateral aspect of the left upper lobe related to prior left-sided breast radiation therapy.  No definite signs of local recurrence of disease or metastatic disease to the thorax on today's  examination. 2.  There is one small 3 mm nodule in the superior segment of the left lower lobe which is highly nonspecific but does warrant attention on future follow up examinations given the patient's history of breast cancer and smoking.  A 1 year followup CT scan is recommended at this time. 3. Atherosclerosis, including left main and three-vessel coronary artery disease.   Assessment for potential risk factor modification, dietary therapy or pharmacologic therapy may be warranted, if clinically indicated. 4. Mild diffuse bronchial wall thickening with mild centrilobular emphysema; imaging findings compatible with underlying COPD. 5.  Small hiatal hernia. Orders Placed This Encounter  Procedures  . NM PET Image Initial (PI) Skull Base To Thigh    Standing Status:   Future    Standing Expiration Date:   03/13/2018    Order Specific Question:   If indicated for the ordered procedure, I authorize the administration of a radiopharmaceutical per Radiology protocol    Answer:   Yes    Order Specific Question:   Preferred imaging location?    Answer:   [facility]    Order Specific Question:   Radiology Contrast Protocol - do NOT remove file path    Answer:   [email or web address]    Order Specific Question:   Reason for Exam additional comments    Answer:   Breast cancer recurrence, pulmonary nodule on Prior CT done 2014       Janely Gullickson  Shahzad Thomann MD

## 2017-03-13 NOTE — Patient Instructions (Addendum)
[facility] at [facility] Discharge Instructions   You were seen today by Dr. Zoila Shutter We are not going to be doing treatment today.  We want to get further studies. We want to get a PET scan.  They are now being done every other week here at [facility]. We will bring you back to review results and start treatment a few days later.   Thank you for choosing [place] at [facility] to provide your oncology and hematology care.  To afford each patient quality time with our provider, please arrive at least 15 minutes before your scheduled appointment time.    If you have a lab appointment with the [facility] please come in thru the  Main Entrance and check in at the main information desk  You need to re-schedule your appointment should you arrive 10 or more minutes late.  We strive to give you quality time with our providers, and arriving late affects you and other patients whose appointments are after yours.  Also, if you no show three or more times for appointments you may be dismissed from the clinic at the providers discretion.     Again, thank you for choosing [facility].  Our hope is that these requests will decrease the amount of time that you wait before being seen by our physicians.       _____________________________________________________________  Should you have questions after your visit to [facility], please contact our office at [phone] between the hours of 8:30 a.m. and 4:30 p.m.  Voicemails left after 4:30 p.m. will not be returned until the following business day.  For prescription refill requests, have your pharmacy contact our office.       Resources For Cancer Patients and their Caregivers ? American Cancer Society: Can assist with transportation, wigs, general needs, runs Look Good Feel Better.        678-559-3239 ? Cancer Care: Provides financial assistance, online support  groups, medication/co-pay assistance.  1-800-813-HOPE 872-790-7917) ? [place] Assists [place] cancer patients and their families through emotional , educational and financial support.  518-374-8925 ? [place] DSS Where to apply for food stamps, Medicaid and utility assistance. (706)188-6626 ? RCATS: Transportation to medical appointments. (306) 458-2675 ? Social Security Administration: May apply for disability if have a Stage IV cancer. 813 544 2606 438-280-5023 ? [employer], Disability and Transit Services: Assists with nutrition, care and transit needs. [place] Support Programs: @10RELATIVEDAYS @  > Cancer Support Group  2nd Tuesday of the month 1pm-2pm, Journey Room   > Creative Journey  3rd Tuesday of the month 1130am-1pm, Journey Room

## 2017-03-18 ENCOUNTER — Telehealth (HOSPITAL_COMMUNITY): Payer: Self-pay

## 2017-03-18 NOTE — Telephone Encounter (Signed)
61 Pt to clinic for someone to look at her portacath which was put in on 2 /27/19. Pt was concerned that it was " sticking out more and a little tender" Bandaid removed from port insertion site. Incision line above portacath looks within normal limits with glue intact and portacath itself is without redness or swelling. Pt assured that everything looks good and pt verbalized understanding and felt better about things

## 2017-03-20 ENCOUNTER — Encounter (HOSPITAL_COMMUNITY): Payer: Medicare PPO

## 2017-03-20 ENCOUNTER — Ambulatory Visit (HOSPITAL_COMMUNITY): Payer: Medicare PPO | Admitting: Internal Medicine

## 2017-03-20 ENCOUNTER — Other Ambulatory Visit (HOSPITAL_COMMUNITY): Payer: Medicare PPO

## 2017-03-24 ENCOUNTER — Ambulatory Visit (HOSPITAL_COMMUNITY): Payer: Medicare PPO | Admitting: Internal Medicine

## 2017-03-24 ENCOUNTER — Other Ambulatory Visit (HOSPITAL_COMMUNITY): Payer: Medicare PPO

## 2017-03-27 ENCOUNTER — Ambulatory Visit (HOSPITAL_COMMUNITY)
Admission: RE | Admit: 2017-03-27 | Discharge: 2017-03-27 | Disposition: A | Discharge status: Home / Self Care | Payer: Medicare Other | Source: Ambulatory Visit | Attending: Internal Medicine | Admitting: Internal Medicine

## 2017-03-27 DIAGNOSIS — C50312 Malignant neoplasm of lower-inner quadrant of left female breast: Secondary | ICD-10-CM | POA: Insufficient documentation

## 2017-03-27 DIAGNOSIS — R911 Solitary pulmonary nodule: Secondary | ICD-10-CM | POA: Insufficient documentation

## 2017-03-27 DIAGNOSIS — C50912 Malignant neoplasm of unspecified site of left female breast: Secondary | ICD-10-CM | POA: Diagnosis not present

## 2017-03-27 DIAGNOSIS — R590 Localized enlarged lymph nodes: Secondary | ICD-10-CM | POA: Insufficient documentation

## 2017-03-27 LAB — GLUCOSE, CAPILLARY: GLUCOSE-CAPILLARY: 101 mg/dL — AB (ref 65–99)

## 2017-03-27 MED ORDER — FLUDEOXYGLUCOSE F - 18 (FDG) INJECTION
6.3000 | Freq: Once | INTRAVENOUS | Status: AC | PRN
  Administered 2017-03-27: 6.3 via INTRAVENOUS

## 2017-03-30 ENCOUNTER — Inpatient Hospital Stay (HOSPITAL_BASED_OUTPATIENT_CLINIC_OR_DEPARTMENT_OTHER): Payer: Medicare Other | Admitting: Internal Medicine

## 2017-03-30 ENCOUNTER — Inpatient Hospital Stay (HOSPITAL_COMMUNITY): Payer: Medicare Other

## 2017-03-30 ENCOUNTER — Encounter (HOSPITAL_COMMUNITY): Payer: Self-pay | Admitting: Internal Medicine

## 2017-03-30 VITALS — BP 135/93 | HR 103 | Temp 98.1°F | Resp 20 | Wt 125.0 lb

## 2017-03-30 DIAGNOSIS — Z452 Encounter for adjustment and management of vascular access device: Secondary | ICD-10-CM | POA: Diagnosis not present

## 2017-03-30 DIAGNOSIS — C50312 Malignant neoplasm of lower-inner quadrant of left female breast: Secondary | ICD-10-CM

## 2017-03-30 DIAGNOSIS — Z171 Estrogen receptor negative status [ER-]: Secondary | ICD-10-CM | POA: Diagnosis not present

## 2017-03-30 DIAGNOSIS — R911 Solitary pulmonary nodule: Secondary | ICD-10-CM | POA: Diagnosis not present

## 2017-03-30 DIAGNOSIS — I10 Essential (primary) hypertension: Secondary | ICD-10-CM | POA: Diagnosis not present

## 2017-03-30 DIAGNOSIS — R51 Headache: Secondary | ICD-10-CM | POA: Diagnosis not present

## 2017-03-30 LAB — CBC WITH DIFFERENTIAL/PLATELET
Basophils Absolute: 0.1 10*3/uL (ref 0.0–0.1)
Basophils Relative: 1 %
EOS PCT: 4 %
Eosinophils Absolute: 0.3 10*3/uL (ref 0.0–0.7)
HEMATOCRIT: 43.2 % (ref 36.0–46.0)
Hemoglobin: 13.5 g/dL (ref 12.0–15.0)
LYMPHS ABS: 3 10*3/uL (ref 0.7–4.0)
LYMPHS PCT: 43 %
MCH: 30.2 pg (ref 26.0–34.0)
MCHC: 31.3 g/dL (ref 30.0–36.0)
MCV: 96.6 fL (ref 78.0–100.0)
MONO ABS: 0.6 10*3/uL (ref 0.1–1.0)
MONOS PCT: 9 %
NEUTROS ABS: 3.2 10*3/uL (ref 1.7–7.7)
Neutrophils Relative %: 43 %
PLATELETS: 259 10*3/uL (ref 150–400)
RBC: 4.47 MIL/uL (ref 3.87–5.11)
RDW: 13 % (ref 11.5–15.5)
WBC: 7.1 10*3/uL (ref 4.0–10.5)

## 2017-03-30 LAB — COMPREHENSIVE METABOLIC PANEL
ALT: 11 U/L — ABNORMAL LOW (ref 14–54)
AST: 17 U/L (ref 15–41)
Albumin: 3.8 g/dL (ref 3.5–5.0)
Alkaline Phosphatase: 95 U/L (ref 38–126)
Anion gap: 12 (ref 5–15)
BUN: 9 mg/dL (ref 6–20)
CO2: 25 mmol/L (ref 22–32)
Calcium: 9.3 mg/dL (ref 8.9–10.3)
Chloride: 103 mmol/L (ref 101–111)
Creatinine, Ser: 0.91 mg/dL (ref 0.44–1.00)
GFR calc Af Amer: >60 mL/min (ref >60)
GFR calc non Af Amer: 58 mL/min — ABNORMAL LOW (ref >60)
Glucose, Bld: 108 mg/dL — ABNORMAL HIGH (ref 65–99)
Potassium: 4 mmol/L (ref 3.5–5.1)
Sodium: 140 mmol/L (ref 135–145)
Total Bilirubin: 0.6 mg/dL (ref 0.3–1.2)
Total Protein: 7.6 g/dL (ref 6.5–8.1)

## 2017-03-30 NOTE — Patient Instructions (Addendum)
[place] at [facility] Discharge Instructions You were seen today by Dr. Walden Field. She went over your recent PET results and it's findings. She discussed getting a lung biopsy on the lymph node in the center of your chest. She wants to hold your treatment today and restart that after you have a biopsy. We will get you scheduled for the biopsy of your lymph node that were found on your recent scan. We will see you back in 4 weeks for follow up and review of biopsy results.   Thank you for choosing [place] at [facility] to provide your oncology and hematology care.  To afford each patient quality time with our provider, please arrive at least 15 minutes before your scheduled appointment time.   If you have a lab appointment with the [facility] please come in thru the  Main Entrance and check in at the main information desk  You need to re-schedule your appointment should you arrive 10 or more minutes late.  We strive to give you quality time with our providers, and arriving late affects you and other patients whose appointments are after yours.  Also, if you no show three or more times for appointments you may be dismissed from the clinic at the providers discretion.     Again, thank you for choosing [facility].  Our hope is that these requests will decrease the amount of time that you wait before being seen by our physicians.       _____________________________________________________________  Should you have questions after your visit to [facility], please contact our office at [phone] between the hours of 8:30 a.m. and 4:30 p.m.  Voicemails left after 4:30 p.m. will not be returned until the following business day.  For prescription refill requests, have your pharmacy contact our office.       Resources For Cancer Patients and their Caregivers ? American Cancer Society: Can assist with transportation,  wigs, general needs, runs Look Good Feel Better.        601-641-2978 ? Cancer Care: Provides financial assistance, online support groups, medication/co-pay assistance.  1-800-813-HOPE (603)269-1672) ? [place] Assists [place] cancer patients and their families through emotional , educational and financial support.  (709) 127-1116 ? [place] DSS Where to apply for food stamps, Medicaid and utility assistance. 6151298650 ? RCATS: Transportation to medical appointments. (208) 626-3115 ? Social Security Administration: May apply for disability if have a Stage IV cancer. (843)544-6569 726-676-6196 ? [employer], Disability and Transit Services: Assists with nutrition, care and transit needs. [place] Support Programs:   > Cancer Support Group  2nd Tuesday of the month 1pm-2pm, Journey Room   > Creative Journey  3rd Tuesday of the month 1130am-1pm, Journey Room

## 2017-04-03 ENCOUNTER — Ambulatory Visit (HOSPITAL_COMMUNITY): Payer: Medicare PPO

## 2017-04-06 DIAGNOSIS — K219 Gastro-esophageal reflux disease without esophagitis: Secondary | ICD-10-CM | POA: Diagnosis not present

## 2017-04-06 DIAGNOSIS — Z6824 Body mass index (BMI) 24.0-24.9, adult: Secondary | ICD-10-CM | POA: Diagnosis not present

## 2017-04-06 DIAGNOSIS — E538 Deficiency of other specified B group vitamins: Secondary | ICD-10-CM | POA: Diagnosis not present

## 2017-04-06 DIAGNOSIS — J029 Acute pharyngitis, unspecified: Secondary | ICD-10-CM | POA: Diagnosis not present

## 2017-04-06 DIAGNOSIS — R1110 Vomiting, unspecified: Secondary | ICD-10-CM | POA: Diagnosis not present

## 2017-04-06 DIAGNOSIS — C50912 Malignant neoplasm of unspecified site of left female breast: Secondary | ICD-10-CM | POA: Diagnosis not present

## 2017-04-09 ENCOUNTER — Institutional Professional Consult (permissible substitution): Payer: Medicare Other | Admitting: Thoracic Surgery (Cardiothoracic Vascular Surgery)

## 2017-04-09 VITALS — BP 127/70 | HR 92 | Resp 20 | Ht 61.0 in | Wt 125.0 lb

## 2017-04-09 DIAGNOSIS — R590 Localized enlarged lymph nodes: Secondary | ICD-10-CM

## 2017-04-09 DIAGNOSIS — R911 Solitary pulmonary nodule: Secondary | ICD-10-CM | POA: Diagnosis not present

## 2017-04-09 NOTE — Progress Notes (Signed)
Diagnosis No diagnosis found.  Staging Cancer Staging History of left breast cancer (2003) Staging form: Breast, AJCC 7th Edition - Clinical: Stage IIA (T1c, N1, cM0) - Signed by Baird Cancer, PA-C on 11/23/2012  Malignant neoplasm of lower-inner quadrant of left female breast [facility]) Staging form: Breast, AJCC 8th Edition - Pathologic stage from 01/19/2017: Stage Unknown (pT1c, pNX, cM0, G3, ER: Negative, PR: Negative, HER2: Negative) - Signed by Baird Cancer, PA-C on 02/27/2017   Assessment and Plan1.  T1CN0 left breast cancer triple negative.  Pt was originally diagnosed with Stage IIA ER/PR+, HER2 neg (T1 C. N1 M0) adenocarcinoma of the left breast with surgery on 03/08/2001. She had a 1.5 cm cancer with 1/6 positive sentinel nodes. Reexcision margins were clear. She did participate in NSABP B.-30 randomized to Adriamycin and Taxotere for 4 cycles followed by radiation therapy from 07/13/2001 to 08/27/2001. She then started Arimidex on 07/07/2001 took that until the end of June 2008. She has been thus far without a recurrence.  Mammogram in July 2018 demonstrated new 1.2 x 0.8x 0.8 cm biopsy proven to be ER/PR negative, HER2 negative IDC with DCIS.  She was seen by Dr. Talbert Cage who discussed that  triple negative breast cancers are aggressive and she was recommended for adjuvant chemotherapy since her tumor is greater than 1 cm and triple negative and she was planned for TC for 4 cycles.  She has undergone left simple mastectomy on 01/16/2017 with pathology returning as invasive ductal carcinoma grade 3 measuring 1.8 cm.  She had evidence of DCIS grade 3 lymphovascular invasion was seen.  Margins were negative for cancer.  Tumor was ER PR HER-2 negative.  Ki-67 was 30%.  Review of her chart showed she last had imaging 04/23/2012 of the chest without contrast that showed:     IMPRESSION: 1.  Postoperative changes of prior left breast lumpectomy, axillary nodal dissection, and  postradiation changes in the anterolateral aspect of the left upper lobe related to prior left-sided breast radiation therapy.  No definite signs of local recurrence of disease or metastatic disease to the thorax on today's examination. 2.  There is one small 3 mm nodule in the superior segment of the left lower lobe which is highly nonspecific but does warrant attention on future follow up examinations given the patient's history of breast cancer and smoking.  A 1 year followup CT scan is recommended at this time.  Patient reports she was unaware of these findings.  Due to the new left  breast cancer she will be set up for PET scan for restaging evaluation and for further evaluation of the pulmonary nodule.    Pet scan done 03/27/2017 showed  IMPRESSION: 1. Mildly hypermetabolic small prevascular lymph node and RIGHT lower paratracheal lymph node are indeterminate. 2. Moderate metabolic activity associated with a ill-defined LEFT upper lobe nodule. 3. No hypermetabolic axillary lymphadenopathy. 4. Long segment of hypermetabolic distal esophagus is favored esophagitis 5. Overall no convincing evidence of metastatic breast cancer although the mediastinal lymph nodes and LEFT upper lobe pulmonary nodules are indeterminate.  She will be referred to CT surgery for evaluation of the LN to determine if biopsy would be recommended.  Pending their review, she will RTC for follow-up.    2.  Smoking.  This patient has a long smoking history.  She had a CT chest scan done April 23, 2012 that showed a small 3 mm nodule in the left lower lobe that was nonspecific with follow-up imaging recommended.  She has undergone PET scan for evaluation of the pulmonary nodule and this showed mediastinal LN and left pulmonary nodule.  She is referred to CT surgery for evaluation.    3.  Hypertension blood pressure is 135/93.  Follow-up with PCP.     INTERVAL HISTORY: 84 y.o. female returns for followup of Stage  IIA ER/PR+, HER2 neg (T1 C. N1 M0) adenocarcinoma of the left breast with surgery on 03/08/2001. She had a 1.5 cm cancer with 1/6 positive sentinel nodes. Reexcision margins were clear. She did participate in NSABP B.-30 randomized to Adriamycin and Taxotere for 4 cycles followed by radiation therapy from 07/13/2001 to 08/27/2001. She then started Arimidex on 07/07/2001 took that until the end of June 2008.   She underwent a screening bilateral mammogram on 07/28/2016 which demonstrated in the left breast a possible asymmetry with calcification. She then underwent a left diagnostic mammogram on 08/12/2016 targeted ultrasound which demonstrated a hypoechoic irregular mass with indistinct margins at 6:30 position 1 cm from the nipple measuring 1.2 x 1.8 x 0.8 cm. Ultrasound of the left axilla is negative for lymphadenopathy. Patient underwent biopsy of the left breast mass on 08/20/2016 with prognostic panel ER negative, PR negative, HER-2 negative by FISH, invasive ductal carcinoma with DCIS. Patient did not palpate any breast masses herself.  Patient was seen by Dr. Arnoldo Morale and underwent left simple mastectomy on 01/16/2017 with pathology returning as invasive ductal carcinoma grade 3 measuring 1.8 cm.  She had evidence of DCIS grade 3 lymphovascular invasion was seen.  Margins were negative for cancer.  Tumor was ER PR HER-2 negative.  Ki-67 was 30%.   Current Status:  Pt is seen today for follow-up to go over Pet scan.      Malignant neoplasm of lower-inner quadrant of left female breast ([place])   08/21/2016 Pathology Results    Left needle core biopsy, lower-inner quadrant: invasive ductal carcinoma and DCIS.  ER/PR-, HER2-.       01/16/2017 Surgery    Left simple mastectomy by Dr. Alean Rinne ductal carcinoma, grade 3, measuring 1.8 cm with DCIS (high-grade).  Lymphovascular invasion is identified.  Clear surgical margins.        Problem List Patient Active Problem List   Diagnosis Date Noted   . Malignant neoplasm of lower-inner quadrant of left female breast ([place]) [C50.312]   . 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 History of left breast cancer (2003) [Z85.3] 11/23/2012  . Bursitis, shoulder [M75.50] 05/25/2012    Past Medical History Past Medical History:  Diagnosis Date  . Acute MI ([place]) 2003  . Adenocarcinoma of left breast ([place]) 11/23/2012   Stage II (T1 C. N1 M0) adenocarcinoma of the left breast with surgery on 03/08/2001. She had a 1.5 cm cancer with 1/6 positive sentinel nodes. Reexcision margins were clear. She did participate in NSABP B.-30 randomized to Adriamycin and Taxotere for 4 cycles followed by radiation therapy from 07/13/2001 to 08/27/2001. She then started Arimidex on 07/07/2001 took that until the end of June 2008. T  . Anemia   . Bilateral cataracts   . Breast cancer ([place])   . GERD (gastroesophageal reflux disease)   . History of gout   . Hypercholesteremia   . Hypertension   . Malignant neoplasm of lower-inner quadrant of left female breast ([place])   . Osteoporosis     Past Surgical History Past Surgical History:  Procedure Laterality Date  . ABDOMINAL HYSTERECTOMY    . BREAST SURGERY    .  CATARACT EXTRACTION W/PHACO Left 09/04/2014   Procedure: CATARACT EXTRACTION PHACO AND INTRAOCULAR LENS PLACEMENT (IOC);  Surgeon: Tonny Branch, MD;  Location: [facility];  Service: Ophthalmology;  Laterality: Left;  CDE:9.84  . CATARACT EXTRACTION W/PHACO Right 11/09/2014   Procedure: CATARACT EXTRACTION PHACO AND INTRAOCULAR LENS PLACEMENT (IOC);  Surgeon: Tonny Branch, MD;  Location: [facility];  Service: Ophthalmology;  Laterality: Right;  CDE:8.40  . CORONARY ANGIOPLASTY     RCA stent  . LEFT HEART CATHETERIZATION WITH CORONARY ANGIOGRAM N/A 12/21/2012   Procedure: LEFT HEART CATHETERIZATION WITH CORONARY ANGIOGRAM;  Surgeon: Clent Demark, MD;  Location: [facility] CATH LAB;  Service: Cardiovascular;  Laterality: N/A;  . MASTECTOMY MODIFIED RADICAL  Left 01/16/2017   Procedure: SIMPLE MASTECTOMY;  Surgeon: Aviva Signs, MD;  Location: [facility];  Service: General;  Laterality: Left;  Marland Kitchen MASTECTOMY, PARTIAL Left   . PERCUTANEOUS STENT INTERVENTION     RCA  . PORT-A-CATH REMOVAL    . PORTACATH PLACEMENT    . PORTACATH PLACEMENT Right 03/11/2017   Procedure: INSERTION PORT-A-CATH;  Surgeon: Aviva Signs, MD;  Location: [facility];  Service: General;  Laterality: Right;    Family History Family History  Problem Relation Age of Onset  . Stroke Mother      Social History  reports that she has been smoking cigarettes.  She has a 25.00 pack-year smoking history. She has never used smokeless tobacco. She reports that she does not drink alcohol or use drugs.  Medications  Current Outpatient Medications:  .  Artificial Tear Solution (SOOTHE XP OP), Apply 2 drops to eye as needed (dry eye)., Disp: , Rfl:  .  aspirin EC 81 MG EC tablet, Take 1 tablet (81 mg total) by mouth daily., Disp: 30 tablet, Rfl: 3 .  atorvastatin (LIPITOR) 40 MG tablet, Take 40 mg by mouth daily at 6 PM. , Disp: , Rfl:  .  CARTIA XT 180 MG 24 hr capsule, Take 180 mg by mouth daily., Disp: , Rfl:  .  nitroGLYCERIN (NITROSTAT) 0.4 MG SL tablet, Place 1 tablet (0.4 mg total) under the tongue every 5 (five) minutes x 3 doses as needed for chest pain. (Patient taking differently: Place 0.4 mg under the tongue every 5 (five) minutes as needed for chest pain. ), Disp: 25 tablet, Rfl: 12 .  ranitidine (ZANTAC) 150 MG tablet, Take 75 mg by mouth as needed for heartburn. , Disp: , Rfl:  .  cyclophosphamide (CYTOXAN) 2 g chemo injection, Inject into the vein once. Every 3 weeks for 4 cycles, Disp: , Rfl:  .  dexamethasone (DECADRON) 4 MG tablet, Take 2 tablets (8 mg total) by mouth 2 (two) times daily. Start the day before Taxotere. Then again the day after chemo for 3 days. (Patient not taking: Reported on 03/30/2017), Disp: 30 tablet, Rfl: 1 .  DOCEtaxel (TAXOTERE IV), Inject into the  vein. Every 3 weeks for 4 cycles, Disp: , Rfl:  .  ondansetron (ZOFRAN) 8 MG tablet, Take 1 tablet (8 mg total) by mouth 2 (two) times daily as needed for refractory nausea / vomiting. Start on day 3 after chemo. (Patient not taking: Reported on 03/30/2017), Disp: 30 tablet, Rfl: 1 .  Pegfilgrastim (NEULASTA ONPRO [place]), Inject into the skin. Every 3 weeks for 4 cycles, Disp: , Rfl:  .  prochlorperazine (COMPAZINE) 10 MG tablet, Take 1 tablet (10 mg total) by mouth every 6 (six) hours as needed (Nausea or vomiting). (Patient not taking: Reported on 03/30/2017), Disp: 30 tablet,  Rfl: 1  Allergies Azithromycin  Review of Systems Review of Systems - Oncology ROS as per HPI otherwise 12 point ROS is negative.   Physical Exam  Vitals Wt Readings from Last 3 Encounters:  03/30/17 125 lb (56.7 kg)  03/13/17 125 lb 6.4 oz (56.9 kg)  02/27/17 124 lb 14.4 oz (56.7 kg)   Temp Readings from Last 3 Encounters:  03/30/17 98.1 F (36.7 C) (Oral)  03/13/17 98.1 F (36.7 C) (Oral)  03/11/17 97.6 F (36.4 C) (Oral)   BP Readings from Last 3 Encounters:  03/30/17 (!) 135/93  03/13/17 132/69  03/11/17 (!) 156/91   Pulse Readings from Last 3 Encounters:  03/30/17 (!) 103  03/13/17 100  03/11/17 90   Constitutional: Well-developed, well-nourished, and in no distress.   HENT: Head: Normocephalic and atraumatic.  Mouth/Throat: No oropharyngeal exudate. Mucosa moist. Eyes: Pupils are equal, round, and reactive to light. Conjunctivae are normal. No scleral icterus.  Neck: Normal range of motion. Neck supple. No JVD present.  Cardiovascular: Normal rate, regular rhythm and normal heart sounds.  Exam reveals no gallop and no friction rub.   No murmur heard. Pulmonary/Chest: Effort normal and breath sounds normal. No respiratory distress. No wheezes.No rales.  Abdominal: Soft. Bowel sounds are normal. No distension. There is no tenderness. There is no guarding.  Musculoskeletal: No edema or  tenderness.  Lymphadenopathy: No cervical, axillary or supraclavicular adenopathy.  Neurological: Alert and oriented to person, place, and time. No cranial nerve deficit.  Skin: Skin is warm and dry. No rash noted. No erythema. No pallor.  Psychiatric: Affect and judgment normal.  Bilateral mastectomy.  Chaperone present.     Labs Appointment on 03/30/2017  Component Date Value Ref Range Status  . WBC 03/30/2017 7.1  4.0 - 10.5 K/uL Final  . RBC 03/30/2017 4.47  3.87 - 5.11 MIL/uL Final  . Hemoglobin 03/30/2017 13.5  12.0 - 15.0 g/dL Final  . HCT 03/30/2017 43.2  36.0 - 46.0 % Final  . MCV 03/30/2017 96.6  78.0 - 100.0 fL Final  . MCH 03/30/2017 30.2  26.0 - 34.0 pg Final  . MCHC 03/30/2017 31.3  30.0 - 36.0 g/dL Final  . RDW 03/30/2017 13.0  11.5 - 15.5 % Final  . Platelets 03/30/2017 259  150 - 400 K/uL Final  . Neutrophils Relative % 03/30/2017 43  % Final  . Neutro Abs 03/30/2017 3.2  1.7 - 7.7 K/uL Final  . Lymphocytes Relative 03/30/2017 43  % Final  . Lymphs Abs 03/30/2017 3.0  0.7 - 4.0 K/uL Final  . Monocytes Relative 03/30/2017 9  % Final  . Monocytes Absolute 03/30/2017 0.6  0.1 - 1.0 K/uL Final  . Eosinophils Relative 03/30/2017 4  % Final  . Eosinophils Absolute 03/30/2017 0.3  0.0 - 0.7 K/uL Final  . Basophils Relative 03/30/2017 1  % Final  . Basophils Absolute 03/30/2017 0.1  0.0 - 0.1 K/uL Final   Performed at [facility], [address], [place], [place] [address]  . Sodium 03/30/2017 140  135 - 145 mmol/L Final  . Potassium 03/30/2017 4.0  3.5 - 5.1 mmol/L Final  . Chloride 03/30/2017 103  101 - 111 mmol/L Final  . CO2 03/30/2017 25  22 - 32 mmol/L Final  . Glucose, Bld 03/30/2017 108* 65 - 99 mg/dL Final  . BUN 03/30/2017 9  6 - 20 mg/dL Final  . Creatinine, Ser 03/30/2017 0.91  0.44 - 1.00 mg/dL Final  . Calcium 03/30/2017 9.3  8.9 -  10.3 mg/dL Final  . Total Protein 03/30/2017 7.6  6.5 - 8.1 g/dL Final  . Albumin 03/30/2017 3.8  3.5 - 5.0 g/dL Final  .  AST 03/30/2017 17  15 - 41 U/L Final  . ALT 03/30/2017 11* 14 - 54 U/L Final  . Alkaline Phosphatase 03/30/2017 95  38 - 126 U/L Final  . Total Bilirubin 03/30/2017 0.6  0.3 - 1.2 mg/dL Final  . GFR calc non Af Amer 03/30/2017 58* >60 mL/min Final  . GFR calc Af Amer 03/30/2017 >60  >60 mL/min Final   Comment: (NOTE) The eGFR has been calculated using the CKD EPI equation. This calculation has not been validated in all clinical situations. eGFR's persistently <60 mL/min signify possible Chronic Kidney Disease.   Georgiann Hahn gap 03/30/2017 12  5 - 15 Final   Performed at [facility], [address], [place], [place] [address]     Pathology No orders of the defined types were placed in this encounter.      Zoila Shutter MD

## 2017-04-09 NOTE — Progress Notes (Signed)
PCP is Redmond School, MD Referring Provider is Higgs, Mathis Dad, MD  No chief complaint on file.   HPI: [name] is sent for consultation regarding a left upper lobe nodule and mediastinal adenopathy  Margaret Thompson is an 84 year old woman with 100 pack year history of smoking, history of MI, coronary artery disease with an RCA stent in 2014, hypertension, hypercholesterolemia, reflux, osteoporosis, anemia, gout, and breast cancer.  She had a breast cancer back in 2003.  That was a T1c, N1, ER/PR positive adenocarcinoma.  She was treated with Adriamycin and Taxotere for 4 cycles and then had radiation therapy.  A mammogram in July 2018 showed a new lesion.  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  She had a left mastectomy on 01/16/2017.  She later had a Port-A-Cath placed in preparation for chemotherapy but has not yet started therapy.  To complete her metastatic workup a PET/CT was done.  It showed a mildly hypermetabolic right paratracheal node and multiple small prevascular lymph nodes on the left.  There also was a 1 cm spiculated nodule in the left upper lobe that was hypermetabolic with an SUV of 3.3.  There was no hypermetabolic axillary lymph nodes.  She had activity in her distal esophagus favored to represent esophagitis.  She says she feels well.  She denies any chest pain, pressure, tightness, or shortness of breath with exertion.  She says she can walk as far she wants to.  She says that she quit smoking one time for 5 years but started back up again because she was gaining weight.  She does not have any desire to quit at this point in time.  Her appetite is good and her weight has been stable.  She denies any unusual headaches or visual changes.  She does have frequent reflux. Zubrod Score: At the time of surgery this patient's most appropriate activity status/level should be described as: [x]     0    Normal activity, no symptoms []     1     Restricted in physical strenuous activity but ambulatory, able to do out light work []     2    Ambulatory and capable of self care, unable to do work activities, up and about >50 % of waking hours                              []     3    Only limited self care, in bed greater than 50% of waking hours []     4    Completely disabled, no self care, confined to bed or chair []     5    Moribund   Past Medical History:  Diagnosis Date  . Acute MI ([place]) 2003  . Adenocarcinoma of left breast ([place]) 11/23/2012   Stage II (T1 C. N1 M0) adenocarcinoma of the left breast with surgery on 03/08/2001. She had a 1.5 cm cancer with 1/6 positive sentinel nodes. Reexcision margins were clear. She did participate in NSABP B.-30 randomized to Adriamycin and Taxotere for 4 cycles followed by radiation therapy from 07/13/2001 to 08/27/2001. She then started Arimidex on 07/07/2001 took that until the end of June 2008. T  . Anemia   . Bilateral cataracts   . Breast cancer ([place])   . GERD (gastroesophageal reflux disease)   . History of gout   . Hypercholesteremia   . Hypertension   .  Malignant neoplasm of lower-inner quadrant of left female breast ([place])   . Osteoporosis     Past Surgical History:  Procedure Laterality Date  . ABDOMINAL HYSTERECTOMY    . BREAST SURGERY    . CATARACT EXTRACTION W/PHACO Left 09/04/2014   Procedure: CATARACT EXTRACTION PHACO AND INTRAOCULAR LENS PLACEMENT (IOC);  Surgeon: Tonny Branch, MD;  Location: [facility];  Service: Ophthalmology;  Laterality: Left;  CDE:9.84  . CATARACT EXTRACTION W/PHACO Right 11/09/2014   Procedure: CATARACT EXTRACTION PHACO AND INTRAOCULAR LENS PLACEMENT (IOC);  Surgeon: Tonny Branch, MD;  Location: [facility];  Service: Ophthalmology;  Laterality: Right;  CDE:8.40  . CORONARY ANGIOPLASTY     RCA stent  . LEFT HEART CATHETERIZATION WITH CORONARY ANGIOGRAM N/A 12/21/2012   Procedure: LEFT HEART CATHETERIZATION WITH CORONARY ANGIOGRAM;  Surgeon: Clent Demark, MD;   Location: [place] [facility];  Service: Cardiovascular;  Laterality: N/A;  . MASTECTOMY MODIFIED RADICAL Left 01/16/2017   Procedure: SIMPLE MASTECTOMY;  Surgeon: Aviva Signs, MD;  Location: [facility];  Service: General;  Laterality: Left;  Marland Kitchen MASTECTOMY, PARTIAL Left   . PERCUTANEOUS STENT INTERVENTION     RCA  . PORT-A-CATH REMOVAL    . PORTACATH PLACEMENT    . PORTACATH PLACEMENT Right 03/11/2017   Procedure: INSERTION PORT-A-CATH;  Surgeon: Aviva Signs, MD;  Location: [facility];  Service: General;  Laterality: Right;    Family History  Problem Relation Age of Onset  . Stroke Mother     Social History Social History   Tobacco Use  . Smoking status: Current Every Day Smoker    Packs/day: 0.50    Years: 50.00    Pack years: 25.00    Types: Cigarettes  . Smokeless tobacco: Never Used  Substance Use Topics  . Alcohol use: No  . Drug use: No    Current Outpatient Medications  Medication Sig Dispense Refill  . Artificial Tear Solution (SOOTHE XP OP) Apply 2 drops to eye as needed (dry eye).    Marland Kitchen aspirin EC 81 MG EC tablet Take 1 tablet (81 mg total) by mouth daily. 30 tablet 3  . atorvastatin (LIPITOR) 40 MG tablet Take 40 mg by mouth daily at 6 PM.     . CARTIA XT 180 MG 24 hr capsule Take 180 mg by mouth daily.    . cyclophosphamide (CYTOXAN) 2 g chemo injection Inject into the vein once. Every 3 weeks for 4 cycles    . dexamethasone (DECADRON) 4 MG tablet Take 2 tablets (8 mg total) by mouth 2 (two) times daily. Start the day before Taxotere. Then again the day after chemo for 3 days. 30 tablet 1  . DOCEtaxel (TAXOTERE IV) Inject into the vein. Every 3 weeks for 4 cycles    . nitroGLYCERIN (NITROSTAT) 0.4 MG SL tablet Place 1 tablet (0.4 mg total) under the tongue every 5 (five) minutes x 3 doses as needed for chest pain. (Patient taking differently: Place 0.4 mg under the tongue every 5 (five) minutes as needed for chest pain. ) 25 tablet 12  . ondansetron (ZOFRAN) 8 MG tablet Take 1  tablet (8 mg total) by mouth 2 (two) times daily as needed for refractory nausea / vomiting. Start on day 3 after chemo. 30 tablet 1  . Pegfilgrastim (NEULASTA ONPRO [place]) Inject into the skin. Every 3 weeks for 4 cycles    . prochlorperazine (COMPAZINE) 10 MG tablet Take 1 tablet (10 mg total) by mouth every 6 (six) hours as needed (Nausea or vomiting).  30 tablet 1  . ranitidine (ZANTAC) 150 MG tablet Take 75 mg by mouth as needed for heartburn.      No current facility-administered medications for this visit.     Allergies  Allergen Reactions  . Azithromycin Hives    Review of Systems  Constitutional: Negative for activity change, fatigue and unexpected weight change.  HENT: Negative for trouble swallowing and voice change.   Eyes: Negative for visual disturbance.  Respiratory: Negative for cough, chest tightness, shortness of breath and wheezing.   Cardiovascular: Negative for chest pain and leg swelling.  Gastrointestinal: Positive for abdominal pain (reflux). Negative for abdominal distention.  Genitourinary: Negative for difficulty urinating and dysuria.  Musculoskeletal: Negative for arthralgias and myalgias.  Neurological: Negative for syncope, weakness and headaches.  Hematological: Negative for adenopathy. Does not bruise/bleed easily.  All other systems reviewed and are negative.   BP 127/70   Pulse 92   Resp 20   Ht 5\' 1"  (1.549 m)   Wt 125 lb (56.7 kg)   SpO2 98% Comment: RA  BMI 23.62 kg/m  Physical Exam  Constitutional: She is oriented to person, place, and time. She appears well-developed and well-nourished. No distress.  HENT:  Head: Normocephalic and atraumatic.  Mouth/Throat: No oropharyngeal exudate.  Eyes: Conjunctivae and EOM are normal. No scleral icterus.  Neck: Neck supple. No thyromegaly present.  Cardiovascular: Normal rate, regular rhythm and normal heart sounds. Exam reveals no gallop and no friction rub.  No murmur heard. Pulmonary/Chest: Effort  normal. No respiratory distress. She has no wheezes.  Abdominal: Soft. She exhibits no distension. There is no tenderness.  Musculoskeletal: She exhibits no edema.  Lymphadenopathy:    She has no cervical adenopathy.  Neurological: She is alert and oriented to person, place, and time. No cranial nerve deficit. She exhibits normal muscle tone.  Skin: Skin is warm and dry.  Vitals reviewed.    Diagnostic Tests: NUCLEAR MEDICINE PET SKULL BASE TO THIGH  TECHNIQUE: 6.3 mCi F-18 FDG was injected intravenously. Full-ring initial PET imaging was performed from the skull base to thigh after the radiotracer. CT data was obtained and used for attenuation correction and anatomic localization.  Fasting blood glucose: 101 mg/dl  Mediastinal blood pool activity: SUV max 2.2  COMPARISON:  Chest CT 04/23/2012  FINDINGS: NECK: No hypermetabolic lymph nodes in the neck.  Incidental CT findings: none  CHEST: Port in the anterior chest wall with tip in distal SVC.  postsurgical inflammation in the LEFT chest wall. Post LEFT mastectomy.  No hypermetabolic LEFT axial lymph nodes.  No abnormal radiotracer activity in the RIGHT breast. No RIGHT breast adenopathy.  No hypermetabolic internal mammary nodes.  Cluster of small prevascular lymph nodes with mild metabolic activity above background blood pool with SUV max equal 3.0. RIGHT lower paratracheal lymph node with SUV max equal 3.7 measures 10 mm short axis.  Moderate metabolic activity the long segment of distal esophagus leading up the GE junction. Small hiatal hernia.  In lung parenchyma, nodule within the lingula measures 10 mm on image 33/8 and with SUV max equal 3.3. The small nodule described on comparison CT is not identified  Incidental CT findings: none  ABDOMEN/PELVIS: No abnormal hypermetabolic activity within the liver, pancreas, adrenal glands, or spleen. No hypermetabolic lymph nodes in the abdomen  or pelvis.  Incidental CT findings: Post hysterectomy. Diverticulosis of the sigmoid colon. Atherosclerotic calcification of the aorta.  SKELETON: No focal hypermetabolic activity to suggest skeletal metastasis.  Incidental CT findings:  none  IMPRESSION: 1. Mildly hypermetabolic small prevascular lymph node and RIGHT lower paratracheal lymph node are indeterminate. 2. Moderate metabolic activity associated with a ill-defined LEFT upper lobe nodule. 3. No hypermetabolic axillary lymphadenopathy. 4. Long segment of hypermetabolic distal esophagus is favored esophagitis 5. Overall no convincing evidence of metastatic breast cancer although the mediastinal lymph nodes and LEFT upper lobe pulmonary nodules are indeterminate.   Electronically Signed   By: Suzy Bouchard M.D.   On: 03/27/2017 14:15  I personally reviewed the PET images and concur with the findings noted above  Impression: Margaret Thompson is an 84 year old woman with a past medical history of breast cancer x2 including recent triple negative ductal carcinoma treated with mastectomy.  She is planning to have chemotherapy but that has not yet been initiated.  She has a long history of tobacco abuse.  A PET/CT that was done for staging purposes shows multiple prominent but not enlarged prevascular nodes on the left side, a right paratracheal node and a left upper lobe nodule.  All of these have some degree of metabolic activity on PET.  This would be an unusual distribution for metastatic breast cancer.  I suspect the paratracheal node and prevascular nodes are reactive.  There is a central fatty hilum in the paratracheal node.  It is possible there are microscopic metastases.  I am really more concerned about the lung nodule.  Given her age, the appearance of the nodule, her smoking history, and the activity on PET I think this almost certainly is a new primary bronchogenic carcinoma..  I reviewed the PET images with Mrs.  Thompson and her daughter.  We discussed the differential diagnosis.  We discussed potential diagnostic and management issues.  My favored approach would be to do endobronchial ultrasound followed by left VATS for wedge resection and possible segmentectomy for the lung nodule. She says that she is not interested in having surgery at all for the lung nodule.  Given that, my recommendation would be that we do a bronchoscopy and endobronchial ultrasound to sample the right paratracheal node.  If the node is negative she might be a candidate for stereotactic radiation to the lung nodule.  It is in fairly close proximity to the rib but it would be better than nothing.  I do not think navigational bronchoscopy to biopsy the lung nodule is a good idea given its peripheral location and cyst adjacent to it.  IR might be willing to attempt a CT-guided biopsy or radiation oncology might be willing to treat the lesion empirically.  I recommended that we proceed with bronchoscopy and endobronchial ultrasound under general anesthesia for diagnosis and staging purposes.  I discussed the general nature of the procedure with Margaret Thompson and her daughter.  They understand it would be done in the operating room under general anesthesia.  It is an outpatient procedure.  They understand that there is a possibility of a nondiagnostic study being falsely negative.  I informed them of the indications, risks, benefits, and alternatives.  They understand the risk include, but not limited to death, MI, stroke, blood clots, bleeding, pneumothorax, as well as the possibility of other unforeseeable complications.  She wishes to think over her options and will let us know if she wants to proceed.    Plan:   Melrose Nakayama, MD [facility] [phone]

## 2017-04-15 ENCOUNTER — Other Ambulatory Visit: Payer: Self-pay | Admitting: Cardiology

## 2017-04-15 DIAGNOSIS — I2510 Atherosclerotic heart disease of native coronary artery without angina pectoris: Secondary | ICD-10-CM | POA: Diagnosis not present

## 2017-04-15 DIAGNOSIS — I10 Essential (primary) hypertension: Secondary | ICD-10-CM | POA: Diagnosis not present

## 2017-04-15 DIAGNOSIS — I252 Old myocardial infarction: Secondary | ICD-10-CM | POA: Diagnosis not present

## 2017-04-15 DIAGNOSIS — R079 Chest pain, unspecified: Secondary | ICD-10-CM

## 2017-04-15 DIAGNOSIS — R0609 Other forms of dyspnea: Secondary | ICD-10-CM | POA: Diagnosis not present

## 2017-04-15 DIAGNOSIS — E785 Hyperlipidemia, unspecified: Secondary | ICD-10-CM | POA: Diagnosis not present

## 2017-04-16 ENCOUNTER — Other Ambulatory Visit: Payer: Self-pay | Admitting: *Deleted

## 2017-04-16 ENCOUNTER — Encounter: Payer: Self-pay | Admitting: Gastroenterology

## 2017-04-20 DIAGNOSIS — R202 Paresthesia of skin: Secondary | ICD-10-CM | POA: Diagnosis not present

## 2017-04-20 DIAGNOSIS — J449 Chronic obstructive pulmonary disease, unspecified: Secondary | ICD-10-CM | POA: Diagnosis not present

## 2017-04-20 DIAGNOSIS — I2510 Atherosclerotic heart disease of native coronary artery without angina pectoris: Secondary | ICD-10-CM | POA: Diagnosis not present

## 2017-04-20 DIAGNOSIS — Z1389 Encounter for screening for other disorder: Secondary | ICD-10-CM | POA: Diagnosis not present

## 2017-04-20 DIAGNOSIS — K219 Gastro-esophageal reflux disease without esophagitis: Secondary | ICD-10-CM | POA: Diagnosis not present

## 2017-04-20 DIAGNOSIS — Z719 Counseling, unspecified: Secondary | ICD-10-CM | POA: Diagnosis not present

## 2017-04-20 DIAGNOSIS — G64 Other disorders of peripheral nervous system: Secondary | ICD-10-CM | POA: Diagnosis not present

## 2017-04-20 DIAGNOSIS — Z6823 Body mass index (BMI) 23.0-23.9, adult: Secondary | ICD-10-CM | POA: Diagnosis not present

## 2017-04-20 DIAGNOSIS — R002 Palpitations: Secondary | ICD-10-CM | POA: Diagnosis not present

## 2017-04-24 ENCOUNTER — Ambulatory Visit (HOSPITAL_COMMUNITY): Payer: Medicare Other

## 2017-04-24 ENCOUNTER — Ambulatory Visit (HOSPITAL_COMMUNITY): Payer: Medicare PPO

## 2017-04-27 ENCOUNTER — Other Ambulatory Visit (HOSPITAL_COMMUNITY): Payer: Self-pay | Admitting: Internal Medicine

## 2017-04-27 DIAGNOSIS — R1319 Other dysphagia: Secondary | ICD-10-CM

## 2017-04-27 DIAGNOSIS — Z6823 Body mass index (BMI) 23.0-23.9, adult: Secondary | ICD-10-CM | POA: Diagnosis not present

## 2017-04-27 DIAGNOSIS — J439 Emphysema, unspecified: Secondary | ICD-10-CM | POA: Diagnosis not present

## 2017-04-27 DIAGNOSIS — I2510 Atherosclerotic heart disease of native coronary artery without angina pectoris: Secondary | ICD-10-CM | POA: Diagnosis not present

## 2017-04-27 DIAGNOSIS — M810 Age-related osteoporosis without current pathological fracture: Secondary | ICD-10-CM | POA: Diagnosis not present

## 2017-04-27 DIAGNOSIS — E782 Mixed hyperlipidemia: Secondary | ICD-10-CM | POA: Diagnosis not present

## 2017-04-28 ENCOUNTER — Ambulatory Visit (HOSPITAL_COMMUNITY): Payer: Medicare Other | Admitting: Internal Medicine

## 2017-05-04 ENCOUNTER — Ambulatory Visit (HOSPITAL_COMMUNITY)
Admission: RE | Admit: 2017-05-04 | Discharge: 2017-05-04 | Disposition: A | Discharge status: Home / Self Care | Payer: Medicare Other | Source: Ambulatory Visit | Attending: Cardiology | Admitting: Cardiology

## 2017-05-04 DIAGNOSIS — R079 Chest pain, unspecified: Secondary | ICD-10-CM | POA: Diagnosis not present

## 2017-05-04 DIAGNOSIS — R072 Precordial pain: Secondary | ICD-10-CM | POA: Diagnosis not present

## 2017-05-04 MED ORDER — TECHNETIUM TC 99M TETROFOSMIN IV KIT
30.0000 | PACK | Freq: Once | INTRAVENOUS | Status: AC | PRN
  Administered 2017-05-04: 30 via INTRAVENOUS

## 2017-05-04 MED ORDER — REGADENOSON 0.4 MG/5ML IV SOLN
0.4000 mg | Freq: Once | INTRAVENOUS | Status: AC
  Administered 2017-05-04: 0.4 mg via INTRAVENOUS

## 2017-05-04 MED ORDER — TECHNETIUM TC 99M TETROFOSMIN IV KIT
10.0000 | PACK | Freq: Once | INTRAVENOUS | Status: AC | PRN
  Administered 2017-05-04: 10 via INTRAVENOUS

## 2017-05-04 MED ORDER — REGADENOSON 0.4 MG/5ML IV SOLN
INTRAVENOUS | Status: AC
  Filled 2017-05-04: qty 5

## 2017-05-05 ENCOUNTER — Ambulatory Visit (HOSPITAL_COMMUNITY): Payer: Medicare Other | Admitting: Internal Medicine

## 2017-05-07 ENCOUNTER — Ambulatory Visit (HOSPITAL_COMMUNITY)
Admission: RE | Admit: 2017-05-07 | Discharge: 2017-05-07 | Disposition: A | Discharge status: Home / Self Care | Payer: Medicare Other | Source: Ambulatory Visit | Attending: Internal Medicine | Admitting: Internal Medicine

## 2017-05-07 DIAGNOSIS — K449 Diaphragmatic hernia without obstruction or gangrene: Secondary | ICD-10-CM | POA: Diagnosis not present

## 2017-05-07 DIAGNOSIS — R1319 Other dysphagia: Secondary | ICD-10-CM | POA: Diagnosis not present

## 2017-05-07 DIAGNOSIS — K224 Dyskinesia of esophagus: Secondary | ICD-10-CM | POA: Insufficient documentation

## 2017-05-15 ENCOUNTER — Ambulatory Visit (HOSPITAL_COMMUNITY): Payer: Medicare PPO

## 2017-05-28 DIAGNOSIS — Z79899 Other long term (current) drug therapy: Secondary | ICD-10-CM | POA: Diagnosis not present

## 2017-06-04 ENCOUNTER — Encounter: Payer: Self-pay | Admitting: *Deleted

## 2017-06-04 ENCOUNTER — Other Ambulatory Visit: Payer: Self-pay | Admitting: *Deleted

## 2017-06-04 ENCOUNTER — Ambulatory Visit: Payer: Medicare Other | Admitting: Nurse Practitioner

## 2017-06-04 ENCOUNTER — Encounter: Payer: Self-pay | Admitting: Nurse Practitioner

## 2017-06-04 ENCOUNTER — Telehealth: Payer: Self-pay | Admitting: *Deleted

## 2017-06-04 DIAGNOSIS — R1310 Dysphagia, unspecified: Secondary | ICD-10-CM | POA: Diagnosis not present

## 2017-06-04 DIAGNOSIS — K219 Gastro-esophageal reflux disease without esophagitis: Secondary | ICD-10-CM

## 2017-06-04 NOTE — Telephone Encounter (Signed)
preop scheduled for 07/28/17 at 11:00am. Letter mailed. Pt aware

## 2017-06-04 NOTE — Assessment & Plan Note (Signed)
Chronic GERD, notes odynophagia.  Query some element of dysphagia although she is unable to fully describe.  PET scan did note abnormality in the distal esophagus likely esophagitis.  She is only on PPI "as needed".  At this point I will have her restart her PPI to once daily.  After EGD evaluation she may need twice daily pending results.  Return for follow-up in 3 months.  EGD as per below.

## 2017-06-04 NOTE — Progress Notes (Signed)
CC'D TO PCP °

## 2017-06-04 NOTE — Patient Instructions (Signed)
1. Start taking your acid blocker pantoprazole (Protonix) once a day on an empty stomach, first thing in the morning. 2. We will schedule your upper endoscopy for you. 3. If there is a narrowing that is causing problems we will dilated at that time. 4. Return for follow-up in 3 months. 5. Further recommendations will be made after your endoscopy. 6. Call us if you have any questions or concerns.  At [facility] we value your feedback. You may receive a survey about your visit today. Please share your experience as we strive to create trusting relationships with our patients to provide genuine, compassionate, quality care.  It was great to meet you today!  I hope you have a wonderful summer!!

## 2017-06-04 NOTE — Progress Notes (Signed)
Primary Care Physician:  Redmond School, MD Primary Gastroenterologist:  Dr. Oneida Alar  Chief Complaint  Patient presents with  . Dysphagia    difficulty swallowing at times    HPI:   Margaret Thompson is a 84 y.o. female who presents on referral from primary care for consideration of upper endoscopy.  The patient has a history of adenocarcinoma of the left breast and is currently under the care of oncology.  Patient last saw oncology 03/30/2017 for malignant neoplasm of the left breast.  Diagnosed 08/21/2016 with left needle core biopsy found invasive ductal carcinoma.  Underwent surgery 01/16/2017 for left simple mastectomy.  Lymphovascular invasion was identified with clear surgical margins.  Deemed stage II and 1/6 sentinel nodes.  CT scan did find mildly hypermetabolic small prevascular lymph node in the right lower paratracheal deemed to be indeterminate and a moderate metabolic activity associated with ill-defined left upper lobe nodule.  Patient was referred to CT surgery to determine if biopsy would be recommended.  Underwent cardiovascular thoracic surgery consult 04/09/2017.  They advised against navigational bronchoscopy due to location.  They recommended bronchoscopy and endobronchial ultrasound under general anesthesia.  The patient indicated she would get back to them with her decision on whether to proceed.  Of note there was a hypermetabolic area in the distal esophagus favored to be esophagitis.  Reviewed information provided with referral including primary care note dated 04/06/2017 for which patient notes odynophagia.  The plan at that time was antacids, x-ray to rule out epiglottitis, barium swallow.  Diagnostic esophagus/barium pill esophagram completed 05/07/2017 which found nonobstructing Schatzki's ring just above a small hiatal hernia, age-related esophageal dysmotility, otherwise normal.  Today she states she's doing well overall. Has a history of reflux. Has throat burning. Has  odynophagia intermittently, typically every couple days. Denies abdominal pain. Occasional vomiting which she attributes to GERD and based on dietary intake. Cannot name specific triggers. Avoids her typical triggers. Denies hematochezia, melena, fever, chills, unintentional weight loss. Denies chest pain, dyspnea, dizziness, lightheadedness, syncope, near syncope. Denies any other upper or lower GI symptoms.  Not on chemo or radiation currently.  Past Medical History:  Diagnosis Date  . Acute MI ([place]) 2003  . Adenocarcinoma of left breast ([place]) 11/23/2012   Stage II (T1 C. N1 M0) adenocarcinoma of the left breast with surgery on 03/08/2001. She had a 1.5 cm cancer with 1/6 positive sentinel nodes. Reexcision margins were clear. She did participate in NSABP B.-30 randomized to Adriamycin and Taxotere for 4 cycles followed by radiation therapy from 07/13/2001 to 08/27/2001. She then started Arimidex on 07/07/2001 took that until the end of June 2008. T  . Anemia   . Bilateral cataracts   . Breast cancer ([place])   . GERD (gastroesophageal reflux disease)   . History of gout   . Hypercholesteremia   . Hypertension   . Malignant neoplasm of lower-inner quadrant of left female breast ([place])   . Osteoporosis     Past Surgical History:  Procedure Laterality Date  . ABDOMINAL HYSTERECTOMY    . BREAST SURGERY    . CATARACT EXTRACTION W/PHACO Left 09/04/2014   Procedure: CATARACT EXTRACTION PHACO AND INTRAOCULAR LENS PLACEMENT (IOC);  Surgeon: Tonny Branch, MD;  Location: [facility];  Service: Ophthalmology;  Laterality: Left;  CDE:9.84  . CATARACT EXTRACTION W/PHACO Right 11/09/2014   Procedure: CATARACT EXTRACTION PHACO AND INTRAOCULAR LENS PLACEMENT (IOC);  Surgeon: Tonny Branch, MD;  Location: [facility];  Service: Ophthalmology;  Laterality: Right;  CDE:8.40  . CORONARY ANGIOPLASTY     RCA stent  . LEFT HEART CATHETERIZATION WITH CORONARY ANGIOGRAM N/A 12/21/2012   Procedure: LEFT HEART CATHETERIZATION WITH  CORONARY ANGIOGRAM;  Surgeon: Clent Demark, MD;  Location: [place] CATH [facility];  Service: Cardiovascular;  Laterality: N/A;  . MASTECTOMY MODIFIED RADICAL Left 01/16/2017   Procedure: SIMPLE MASTECTOMY;  Surgeon: Aviva Signs, MD;  Location: [facility];  Service: General;  Laterality: Left;  Marland Kitchen MASTECTOMY, PARTIAL Left   . PERCUTANEOUS STENT INTERVENTION     RCA  . PORT-A-CATH REMOVAL    . PORTACATH PLACEMENT    . PORTACATH PLACEMENT Right 03/11/2017   Procedure: INSERTION PORT-A-CATH;  Surgeon: Aviva Signs, MD;  Location: [facility];  Service: General;  Laterality: Right;    Current Outpatient Medications  Medication Sig Dispense Refill  . Artificial Tear Solution (SOOTHE XP OP) Apply 2 drops to eye as needed (dry eye).    Marland Kitchen aspirin EC 81 MG EC tablet Take 1 tablet (81 mg total) by mouth daily. 30 tablet 3  . atorvastatin (LIPITOR) 40 MG tablet Take 40 mg by mouth daily at 6 PM.     . CARTIA XT 180 MG 24 hr capsule Take 180 mg by mouth daily.    . Cyanocobalamin (B-12 COMPLIANCE INJECTION IJ) Inject as directed every 30 (thirty) days.    . NON FORMULARY Iron supplement   daily    . pantoprazole (PROTONIX) 40 MG tablet Take 40 mg by mouth daily as needed.    . ranitidine (ZANTAC) 150 MG tablet Take 75 mg by mouth as needed for heartburn.      No current facility-administered medications for this visit.     Allergies as of 06/04/2017 - Review Complete 06/04/2017  Allergen Reaction Noted  . Azithromycin Hives 09/15/2010    Family History  Problem Relation Age of Onset  . Stroke Mother   . Colon cancer Neg Hx   . Gastric cancer Neg Hx   . Esophageal cancer Neg Hx     Social History   Socioeconomic History  . Marital status: Widowed    Spouse name: Not on file  . Number of children: Not on file  . Years of education: Not on file  . Highest education level: Not on file  Occupational History  . Not on file  Social Needs  . Financial resource strain: Not on file  . Food insecurity:     Worry: Not on file    Inability: Not on file  . Transportation needs:    Medical: Not on file    Non-medical: Not on file  Tobacco Use  . Smoking status: Current Every Day Smoker    Packs/day: 0.50    Years: 50.00    Pack years: 25.00    Types: Cigarettes  . Smokeless tobacco: Never Used  . Tobacco comment: 10 cigarettes daily  Substance and Sexual Activity  . Alcohol use: No  . Drug use: No  . Sexual activity: Not Currently    Birth control/protection: Surgical  Lifestyle  . Physical activity:    Days per week: Not on file    Minutes per session: Not on file  . Stress: Not on file  Relationships  . Social connections:    Talks on phone: Not on file    Gets together: Not on file    Attends religious service: Not on file    Active member of club or organization: Not on file    Attends meetings of clubs  or organizations: Not on file    Relationship status: Not on file  . Intimate partner violence:    Fear of current or ex partner: Not on file    Emotionally abused: Not on file    Physically abused: Not on file    Forced sexual activity: Not on file  Other Topics Concern  . Not on file  Social History Narrative  . Not on file    Review of Systems: Complete ROS negative except as per HPI.    Physical Exam: BP 126/63   Pulse 89   Temp 98.1 F (36.7 C) (Oral)   Ht 5\' 2"  (1.575 m)   Wt 124 lb 6.4 oz (56.4 kg)   BMI 22.75 kg/m  General:   Alert and oriented. Pleasant and cooperative. Well-nourished and well-developed.  Eyes:  Without icterus, sclera clear and conjunctiva pink.  Ears:  Normal auditory acuity. Cardiovascular:  S1, S2 present without murmurs appreciated. Extremities without clubbing or edema. Respiratory:  Clear to auscultation bilaterally. No wheezes, rales, or rhonchi. No distress.  Gastrointestinal:  +BS, soft, non-tender and non-distended. No HSM noted. No guarding or rebound. No masses appreciated.  Rectal:  Deferred  Musculoskalatal:   Symmetrical without gross deformities. Skin:  Intact without significant lesions or rashes. Neurologic:  Alert and oriented x4;  grossly normal neurologically. Psych:  Alert and cooperative. Normal mood and affect. Heme/Lymph/Immune: No excessive bruising noted.    06/04/2017 10:21 AM   Disclaimer: This note was dictated with voice recognition software. Similar sounding words can inadvertently be transcribed and may not be corrected upon review.

## 2017-06-04 NOTE — Assessment & Plan Note (Signed)
Noted odynophagia postprandially.  Barium pill esophagram found non-obstructing Schatzki's ring.  Reflux and hiatal hernia as well.  She has a long-standing history of GERD and has been on PPI as needed.  I recommended she restart her PPI to once a day.  She is not currently on chemotherapy or radiation so less likely Candida esophagus.  At this point we will proceed with upper endoscopy with possible dilation to further evaluate and treat her symptoms.  He has had a previous MI with coronary angioplasty about 5 years ago however she is not currently on antiplatelet or blood thinner.  Proceed with EGD +/- dilation with Dr. Oneida Alar in near future: the risks, benefits, and alternatives have been discussed with the patient in detail. The patient states understanding and desires to proceed.  The patient is not on any anticoagulants, anxiolytics, chronic pain medications, or antidepressants.  Conscious sedation should be adequate for her procedure.

## 2017-07-15 DIAGNOSIS — I10 Essential (primary) hypertension: Secondary | ICD-10-CM | POA: Diagnosis not present

## 2017-07-15 DIAGNOSIS — I252 Old myocardial infarction: Secondary | ICD-10-CM | POA: Diagnosis not present

## 2017-07-15 DIAGNOSIS — I2510 Atherosclerotic heart disease of native coronary artery without angina pectoris: Secondary | ICD-10-CM | POA: Diagnosis not present

## 2017-07-24 DIAGNOSIS — D510 Vitamin B12 deficiency anemia due to intrinsic factor deficiency: Secondary | ICD-10-CM | POA: Diagnosis not present

## 2017-07-27 NOTE — Patient Instructions (Signed)
Margaret Thompson  07/27/2017     @PREFPERIOPPHARMACY @   Your procedure is scheduled on  08/04/2017 .  Report to [facility] at  615   A.M.  Call this number if you have problems the morning of surgery:  [phone]   Remember:  Do not eat or drink after midnight.  You may drink clear liquids until  (follow the instructions given to you) .  Clear liquids allowed are:                    Water, Juice (non-citric and without pulp), Carbonated beverages, Clear Tea, Black Coffee only, Plain Jell-O only, Gatorade and Plain Popsicles only    Take these medicines the morning of surgery with A SIP OF WATER  cartia XT, protonix.    Do not wear jewelry, make-up or nail polish.  Do not wear lotions, powders, or perfumes, or deodorant.  Do not shave 48 hours prior to surgery.  Men may shave face and neck.  Do not bring valuables to the hospital.  [facility] is not responsible for any belongings or valuables.  Contacts, dentures or bridgework may not be worn into surgery.  Leave your suitcase in the car.  After surgery it may be brought to your room.  For patients admitted to the hospital, discharge time will be determined by your treatment team.  Patients discharged the day of surgery will not be allowed to drive home.   Name and phone number of your driver:   family Special instructions:  Follow the diet and prep instructions given to you by Dr Nona Dell office.  Please read over the following fact sheets that you were given. Anesthesia Post-op Instructions and Care and Recovery After Surgery       Esophagogastroduodenoscopy Esophagogastroduodenoscopy (EGD) is a procedure to examine the lining of the esophagus, stomach, and first part of the small intestine (duodenum). This procedure is done to check for problems such as inflammation, bleeding, ulcers, or growths. During this procedure, a long, flexible, lighted tube with a camera attached (endoscope) is inserted down the  throat. Tell a health care provider about:  Any allergies you have.  All medicines you are taking, including vitamins, herbs, eye drops, creams, and over-the-counter medicines.  Any problems you or family members have had with anesthetic medicines.  Any blood disorders you have.  Any surgeries you have had.  Any medical conditions you have.  Whether you are pregnant or may be pregnant. What are the risks? Generally, this is a safe procedure. However, problems may occur, including:  Infection.  Bleeding.  A tear (perforation) in the esophagus, stomach, or duodenum.  Trouble breathing.  Excessive sweating.  Spasms of the larynx.  A slowed heartbeat.  Low blood pressure.  What happens before the procedure?  Follow instructions from your health care provider about eating or drinking restrictions.  Ask your health care provider about: ? Changing or stopping your regular medicines. This is especially important if you are taking diabetes medicines or blood thinners. ? Taking medicines such as aspirin and ibuprofen. These medicines can thin your blood. Do not take these medicines before your procedure if your health care provider instructs you not to.  Plan to have someone take you home after the procedure.  If you wear dentures, be ready to remove them before the procedure. What happens during the procedure?  To reduce your risk of infection, your health  care team will wash or sanitize their hands.  An IV tube will be put in a vein in your hand or arm. You will get medicines and fluids through this tube.  You will be given one or more of the following: ? A medicine to help you relax (sedative). ? A medicine to numb the area (local anesthetic). This medicine may be sprayed into your throat. It will make you feel more comfortable and keep you from gagging or coughing during the procedure. ? A medicine for pain.  A mouth guard may be placed in your mouth to protect your  teeth and to keep you from biting on the endoscope.  You will be asked to lie on your left side.  The endoscope will be lowered down your throat into your esophagus, stomach, and duodenum.  Air will be put into the endoscope. This will help your health care provider see better.  The lining of your esophagus, stomach, and duodenum will be examined.  Your health care provider may: ? Take a tissue sample so it can be looked at in a lab (biopsy). ? Remove growths. ? Remove objects (foreign bodies) that are stuck. ? Treat any bleeding with medicines or other devices that stop tissue from bleeding. ? Widen (dilate) or stretch narrowed areas of your esophagus and stomach.  The endoscope will be taken out. The procedure may vary among health care providers and hospitals. What happens after the procedure?  Your blood pressure, heart rate, breathing rate, and blood oxygen level will be monitored often until the medicines you were given have worn off.  Do not eat or drink anything until the numbing medicine has worn off and your gag reflex has returned. This information is not intended to replace advice given to you by your health care provider. Make sure you discuss any questions you have with your health care provider. Document Released: 05/02/2004 Document Revised: 06/07/2015 Document Reviewed: 11/23/2014 Elsevier Interactive Patient Education  2018 [employer].  Esophageal Dilatation Esophageal dilatation is a procedure to open a blocked or narrowed part of the esophagus. The esophagus is the long tube in your throat that carries food and liquid from your mouth to your stomach. The procedure is also called esophageal dilation. You may need this procedure if you have a buildup of scar tissue in your esophagus that makes it difficult, painful, or even impossible to swallow. This can be caused by gastroesophageal reflux disease (GERD). In rare cases, people need this procedure because they have  cancer of the esophagus or a problem with the way food moves through the esophagus. Sometimes you may need to have another dilatation to enlarge the opening of the esophagus gradually. Tell a health care provider about:  Any allergies you have.  All medicines you are taking, including vitamins, herbs, eye drops, creams, and over-the-counter medicines.  Any problems you or family members have had with anesthetic medicines.  Any blood disorders you have.  Any surgeries you have had.  Any medical conditions you have.  Any antibiotic medicines you are required to take before dental procedures. What are the risks? Generally, this is a safe procedure. However, problems can occur and include:  Bleeding from a tear in the lining of the esophagus.  A hole (perforation) in the esophagus.  What happens before the procedure?  Do not eat or drink anything after midnight on the night before the procedure or as directed by your health care provider.  Ask your health care provider about  changing or stopping your regular medicines. This is especially important if you are taking diabetes medicines or blood thinners.  Plan to have someone take you home after the procedure. What happens during the procedure?  You will be given a medicine that makes you relaxed and sleepy (sedative).  A medicine may be sprayed or gargled to numb the back of the throat.  Your health care provider can use various instruments to do an esophageal dilatation. During the procedure, the instrument used will be placed in your mouth and passed down into your esophagus. Options include: ? Simple dilators. This instrument is carefully placed in the esophagus to stretch it. ? Guided wire bougies. In this method, a flexible tube (endoscope) is used to insert a wire into the esophagus. The dilator is passed over this wire to enlarge the esophagus. Then the wire is removed. ? Balloon dilators. An endoscope with a small balloon at  the end is passed down into the esophagus. Inflating the balloon gently stretches the esophagus and opens it up. What happens after the procedure?  Your blood pressure, heart rate, breathing rate, and blood oxygen level will be monitored often until the medicines you were given have worn off.  Your throat may feel slightly sore and will probably still feel numb. This will improve slowly over time.  You will not be allowed to eat or drink until the throat numbness has resolved.  If this is a same-day procedure, you may be allowed to go home once you have been able to drink, urinate, and sit on the edge of the bed without nausea or dizziness.  If this is a same-day procedure, you should have a friend or family member with you for the next 24 hours after the procedure. This information is not intended to replace advice given to you by your health care provider. Make sure you discuss any questions you have with your health care provider. Document Released: 02/20/2005 Document Revised: 06/07/2015 Document Reviewed: 05/11/2013 Elsevier Interactive Patient Education  2018 [employer]. Esophagogastroduodenoscopy, Care After Refer to this sheet in the next few weeks. These instructions provide you with information about caring for yourself after your procedure. Your health care provider may also give you more specific instructions. Your treatment has been planned according to current medical practices, but problems sometimes occur. Call your health care provider if you have any problems or questions after your procedure. What can I expect after the procedure? After the procedure, it is common to have:  A sore throat.  Nausea.  Bloating.  Dizziness.  Fatigue.  Follow these instructions at home:  Do not eat or drink anything until the numbing medicine (local anesthetic) has worn off and your gag reflex has returned. You will know that the local anesthetic has worn off when you can swallow  comfortably.  Do not drive for 24 hours if you received a medicine to help you relax (sedative).  If your health care provider took a tissue sample for testing during the procedure, make sure to get your test results. This is your responsibility. Ask your health care provider or the department performing the test when your results will be ready.  Keep all follow-up visits as told by your health care provider. This is important. Contact a health care provider if:  You cannot stop coughing.  You are not urinating.  You are urinating less than usual. Get help right away if:  You have trouble swallowing.  You cannot eat or drink.  You  have throat or chest pain that gets worse.  You are dizzy or light-headed.  You faint.  You have nausea or vomiting.  You have chills.  You have a fever.  You have severe abdominal pain.  You have black, tarry, or bloody stools. This information is not intended to replace advice given to you by your health care provider. Make sure you discuss any questions you have with your health care provider. Document Released: 12/17/2011 Document Revised: 06/07/2015 Document Reviewed: 11/23/2014 Elsevier Interactive Patient Education  2018 [place] Anesthesia is a term that refers to techniques, procedures, and medicines that help a person stay safe and comfortable during a medical procedure. Monitored anesthesia care, or sedation, is one type of anesthesia. Your anesthesia specialist may recommend sedation if you will be having a procedure that does not require you to be unconscious, such as:  Cataract surgery.  A dental procedure.  A biopsy.  A colonoscopy.  During the procedure, you may receive a medicine to help you relax (sedative). There are three levels of sedation:  Mild sedation. At this level, you may feel awake and relaxed. You will be able to follow directions.  Moderate sedation. At this level, you will  be sleepy. You may not remember the procedure.  Deep sedation. At this level, you will be asleep. You will not remember the procedure.  The more medicine you are given, the deeper your level of sedation will be. Depending on how you respond to the procedure, the anesthesia specialist may change your level of sedation or the type of anesthesia to fit your needs. An anesthesia specialist will monitor you closely during the procedure. Let your health care provider know about:  Any allergies you have.  All medicines you are taking, including vitamins, herbs, eye drops, creams, and over-the-counter medicines.  Any use of steroids (by mouth or as a cream).  Any problems you or family members have had with sedatives and anesthetic medicines.  Any blood disorders you have.  Any surgeries you have had.  Any medical conditions you have, such as sleep apnea.  Whether you are pregnant or may be pregnant.  Any use of cigarettes, alcohol, or street drugs. What are the risks? Generally, this is a safe procedure. However, problems may occur, including:  Getting too much medicine (oversedation).  Nausea.  Allergic reaction to medicines.  Trouble breathing. If this happens, a breathing tube may be used to help with breathing. It will be removed when you are awake and breathing on your own.  Heart trouble.  Lung trouble.  Before the procedure Staying hydrated Follow instructions from your health care provider about hydration, which may include:  Up to 2 hours before the procedure - you may continue to drink clear liquids, such as water, clear fruit juice, black coffee, and plain tea.  Eating and drinking restrictions Follow instructions from your health care provider about eating and drinking, which may include:  8 hours before the procedure - stop eating heavy meals or foods such as meat, fried foods, or fatty foods.  6 hours before the procedure - stop eating light meals or foods,  such as toast or cereal.  6 hours before the procedure - stop drinking milk or drinks that contain milk.  2 hours before the procedure - stop drinking clear liquids.  Medicines Ask your health care provider about:  Changing or stopping your regular medicines. This is especially important if you are taking diabetes medicines or blood  thinners.  Taking medicines such as aspirin and ibuprofen. These medicines can thin your blood. Do not take these medicines before your procedure if your health care provider instructs you not to.  Tests and exams  You will have a physical exam.  You may have blood tests done to show: ? How well your kidneys and liver are working. ? How well your blood can clot.  General instructions  Plan to have someone take you home from the hospital or clinic.  If you will be going home right after the procedure, plan to have someone with you for 24 hours.  What happens during the procedure?  Your blood pressure, heart rate, breathing, level of pain and overall condition will be monitored.  An IV tube will be inserted into one of your veins.  Your anesthesia specialist will give you medicines as needed to keep you comfortable during the procedure. This may mean changing the level of sedation.  The procedure will be performed. After the procedure  Your blood pressure, heart rate, breathing rate, and blood oxygen level will be monitored until the medicines you were given have worn off.  Do not drive for 24 hours if you received a sedative.  You may: ? Feel sleepy, clumsy, or nauseous. ? Feel forgetful about what happened after the procedure. ? Have a sore throat if you had a breathing tube during the procedure. ? Vomit. This information is not intended to replace advice given to you by your health care provider. Make sure you discuss any questions you have with your health care provider. Document Released: 09/25/2004 Document Revised: 06/08/2015 Document  Reviewed: 04/22/2015 Elsevier Interactive Patient Education  2018 [employer], Care After These instructions provide you with information about caring for yourself after your procedure. Your health care provider may also give you more specific instructions. Your treatment has been planned according to current medical practices, but problems sometimes occur. Call your health care provider if you have any problems or questions after your procedure. What can I expect after the procedure? After your procedure, it is common to:  Feel sleepy for several hours.  Feel clumsy and have poor balance for several hours.  Feel forgetful about what happened after the procedure.  Have poor judgment for several hours.  Feel nauseous or vomit.  Have a sore throat if you had a breathing tube during the procedure.  Follow these instructions at home: For at least 24 hours after the procedure:   Do not: ? Participate in activities in which you could fall or become injured. ? Drive. ? Use heavy machinery. ? Drink alcohol. ? Take sleeping pills or medicines that cause drowsiness. ? Make important decisions or sign legal documents. ? Take care of children on your own.  Rest. Eating and drinking  Follow the diet that is recommended by your health care provider.  If you vomit, drink water, juice, or soup when you can drink without vomiting.  Make sure you have little or no nausea before eating solid foods. General instructions  Have a responsible adult stay with you until you are awake and alert.  Take over-the-counter and prescription medicines only as told by your health care provider.  If you smoke, do not smoke without supervision.  Keep all follow-up visits as told by your health care provider. This is important. Contact a health care provider if:  You keep feeling nauseous or you keep vomiting.  You feel light-headed.  You develop a rash.  You have a  fever. Get help right away if:  You have trouble breathing. This information is not intended to replace advice given to you by your health care provider. Make sure you discuss any questions you have with your health care provider. Document Released: 04/22/2015 Document Revised: 08/22/2015 Document Reviewed: 04/22/2015 Elsevier Interactive Patient Education  [employer].

## 2017-07-28 ENCOUNTER — Encounter (HOSPITAL_COMMUNITY): Payer: Self-pay

## 2017-07-28 ENCOUNTER — Encounter (HOSPITAL_COMMUNITY)
Admission: RE | Admit: 2017-07-28 | Discharge: 2017-07-28 | Disposition: A | Discharge status: Home / Self Care | Payer: Medicare Other | Source: Ambulatory Visit | Attending: Gastroenterology | Admitting: Gastroenterology

## 2017-07-29 ENCOUNTER — Encounter (HOSPITAL_COMMUNITY): Payer: Self-pay

## 2017-07-29 ENCOUNTER — Inpatient Hospital Stay (HOSPITAL_COMMUNITY): Payer: Medicare Other

## 2017-07-29 ENCOUNTER — Encounter (HOSPITAL_COMMUNITY)
Admission: RE | Admit: 2017-07-29 | Discharge: 2017-07-29 | Disposition: A | Discharge status: Home / Self Care | Payer: Medicare Other | Source: Ambulatory Visit | Attending: Gastroenterology | Admitting: Gastroenterology

## 2017-07-29 ENCOUNTER — Inpatient Hospital Stay (HOSPITAL_COMMUNITY): Payer: Medicare Other | Attending: Internal Medicine | Admitting: Internal Medicine

## 2017-07-29 DIAGNOSIS — R918 Other nonspecific abnormal finding of lung field: Secondary | ICD-10-CM

## 2017-07-29 DIAGNOSIS — Z923 Personal history of irradiation: Secondary | ICD-10-CM | POA: Diagnosis not present

## 2017-07-29 DIAGNOSIS — C50312 Malignant neoplasm of lower-inner quadrant of left female breast: Secondary | ICD-10-CM | POA: Diagnosis not present

## 2017-07-29 DIAGNOSIS — Z9221 Personal history of antineoplastic chemotherapy: Secondary | ICD-10-CM

## 2017-07-29 DIAGNOSIS — F17210 Nicotine dependence, cigarettes, uncomplicated: Secondary | ICD-10-CM | POA: Insufficient documentation

## 2017-07-29 DIAGNOSIS — Z01812 Encounter for preprocedural laboratory examination: Secondary | ICD-10-CM | POA: Diagnosis not present

## 2017-07-29 LAB — CBC WITH DIFFERENTIAL/PLATELET
BASOS PCT: 0 %
Basophils Absolute: 0 10*3/uL (ref 0.0–0.1)
Eosinophils Absolute: 0.2 10*3/uL (ref 0.0–0.7)
Eosinophils Relative: 3 %
HEMATOCRIT: 40.1 % (ref 36.0–46.0)
Hemoglobin: 13 g/dL (ref 12.0–15.0)
LYMPHS PCT: 40 %
Lymphs Abs: 3.1 10*3/uL (ref 0.7–4.0)
MCH: 30.5 pg (ref 26.0–34.0)
MCHC: 32.4 g/dL (ref 30.0–36.0)
MCV: 94.1 fL (ref 78.0–100.0)
MONO ABS: 0.7 10*3/uL (ref 0.1–1.0)
MONOS PCT: 9 %
NEUTROS ABS: 3.7 10*3/uL (ref 1.7–7.7)
Neutrophils Relative %: 48 %
PLATELETS: 270 10*3/uL (ref 150–400)
RBC: 4.26 MIL/uL (ref 3.87–5.11)
RDW: 12.9 % (ref 11.5–15.5)
WBC: 7.7 10*3/uL (ref 4.0–10.5)

## 2017-07-29 LAB — BASIC METABOLIC PANEL
Anion gap: 9 (ref 5–15)
BUN: 9 mg/dL (ref 8–23)
CALCIUM: 9.3 mg/dL (ref 8.9–10.3)
CO2: 24 mmol/L (ref 22–32)
CREATININE: 0.96 mg/dL (ref 0.44–1.00)
Chloride: 106 mmol/L (ref 98–111)
GFR calc Af Amer: >60 mL/min (ref >60)
GFR calc non Af Amer: 54 mL/min — ABNORMAL LOW (ref >60)
GLUCOSE: 85 mg/dL (ref 70–99)
Potassium: 3.8 mmol/L (ref 3.5–5.1)
Sodium: 139 mmol/L (ref 135–145)

## 2017-07-29 MED ORDER — SODIUM CHLORIDE 0.9% FLUSH: 10.0000 mL | INTRAVENOUS | Status: DC | PRN

## 2017-07-29 MED ORDER — SODIUM CHLORIDE 0.9% FLUSH
10.0000 mL | INTRAVENOUS | Status: DC | PRN
  Administered 2017-07-29: 10 mL via INTRAVENOUS
  Filled 2017-07-29: qty 10

## 2017-07-29 MED ORDER — HEPARIN SOD (PORK) LOCK FLUSH 100 UNIT/ML IV SOLN
500.0000 [IU] | Freq: Once | INTRAVENOUS | Status: AC
  Administered 2017-07-29: 500 [IU] via INTRAVENOUS

## 2017-07-29 MED ORDER — HEPARIN SOD (PORK) LOCK FLUSH 100 UNIT/ML IV SOLN
INTRAVENOUS | Status: AC
  Filled 2017-07-29: qty 5

## 2017-07-29 MED ORDER — HEPARIN SOD (PORK) LOCK FLUSH 100 UNIT/ML IV SOLN: 500.0000 [IU] | Freq: Once | INTRAVENOUS | Status: DC

## 2017-07-29 NOTE — Progress Notes (Signed)
Diagnosis Malignant neoplasm of lower-inner quadrant of left female breast, unspecified estrogen receptor status ([place]) - Plan: Schedule Portacath Flush Appointment, heparin lock flush 100 unit/mL, sodium chloride flush (NS) 0.9 % injection 10 mL  Abnormal findings on diagnostic imaging of lung - Plan: NM PET Image Restag (PS) Skull Base To Thigh  Staging Cancer Staging History of left breast cancer (2003) Staging form: Breast, AJCC 7th Edition - Clinical: Stage IIA (T1c, N1, cM0) - Signed by Baird Cancer, PA-C on 11/23/2012  Malignant neoplasm of lower-inner quadrant of left female breast [facility]) Staging form: Breast, AJCC 8th Edition - Pathologic stage from 01/19/2017: Stage Unknown (pT1c, pNX, cM0, G3, ER: Negative, PR: Negative, HER2: Negative) - Signed by Baird Cancer, PA-C on 02/27/2017   Assessment and Plan:  1.  T1CN0 left breast cancer triple negative.  Pt was originally diagnosed with Stage IIA ER/PR+, HER2 neg (T1 C. N1 M0) adenocarcinoma of the left breast with surgery on 03/08/2001. She had a 1.5 cm cancer with 1/6 positive sentinel nodes. Reexcision margins were clear. She did participate in NSABP B.-30 randomized to Adriamycin and Taxotere for 4 cycles followed by radiation therapy from 07/13/2001 to 08/27/2001. She then started Arimidex on 07/07/2001 took that until the end of June 2008. She has been thus far without a recurrence.  Mammogram in July 2018 demonstrated new 1.2 x 0.8x 0.8 cm biopsy proven to be ER/PR negative, HER2 negative IDC with DCIS.  She was seen by Dr. Talbert Cage and PA Kirby Crigler who discussed that  triple negative breast cancers are aggressive and she was recommended for adjuvant chemotherapy since her tumor is greater than 1 cm and triple negative.  She was planned for [facility] for 4 cycles.  She has undergone left simple mastectomy on 01/16/2017 with pathology returning as invasive ductal carcinoma grade 3 measuring 1.8 cm.  She had evidence of DCIS grade 3  lymphovascular invasion was seen.  Margins were negative for cancer.  Tumor was ER PR HER-2 negative.  Ki-67 was 30%.  Review of her chart showed she last had imaging 04/23/2012 of the chest without contrast that showed:     IMPRESSION: 1.  Postoperative changes of prior left breast lumpectomy, axillary nodal dissection, and postradiation changes in the anterolateral aspect of the left upper lobe related to prior left-sided breast radiation therapy.  No definite signs of local recurrence of disease or metastatic disease to the thorax on today's examination. 2.  There is one small 3 mm nodule in the superior segment of the left lower lobe which is highly nonspecific but does warrant attention on future follow up examinations given the patient's history of breast cancer and smoking.  A 1 year followup CT scan is recommended at this time.  Patient reports she was unaware of these findings.  Due to the new left  breast cancer she was set up for PET scan for restaging evaluation and for further evaluation of the pulmonary nodule and chemotherapy was held.    Pet scan done 03/27/2017 was reviewed and showed  IMPRESSION: 1. Mildly hypermetabolic small prevascular lymph node and RIGHT lower paratracheal lymph node are indeterminate. 2. Moderate metabolic activity associated with a ill-defined LEFT upper lobe nodule. 3. No hypermetabolic axillary lymphadenopathy. 4. Long segment of hypermetabolic distal esophagus is favored esophagitis 5. Overall no convincing evidence of metastatic breast cancer although the mediastinal lymph nodes and LEFT upper lobe pulmonary nodules are indeterminate.  She was referred to CT surgery for evaluation of the  LN to determine if biopsy would be recommended.    Review of chart shows she was seen by Dr. Roxan Hockey 04/09/2017 and per report "He recommended that we proceed with bronchoscopy and endobronchial ultrasound under general anesthesia for diagnosis and  staging purposes.  I discussed the general nature of the procedure with Mrs. Goldbach and her daughter.  They understand it would be done in the operating room under general anesthesia.  It is an outpatient procedure.  They understand that there is a possibility of a nondiagnostic study being falsely negative.  I informed them of the indications, risks, benefits, and alternatives.  They understand the risk include, but not limited to death, MI, stroke, blood clots, bleeding, pneumothorax, as well as the possibility of other unforeseeable complications."  The pt is reports she was not aware of these recommendations.  She also reports she was not aware she missed an April appointment with this office for follow-up.    I have discussed with her today that she will be recommended for repeat PET scan and will RTC to go over the results.  I have again discussed with her that she would need to have evaluation of the lung findings which was recommended back in 03/2017.  Further recommendations will follow once scan has been reviewed.    2.  Smoking.  This patient has a long smoking history.  She had a CT chest scan done April 23, 2012 that showed a small 3 mm nodule in the left lower lobe that was nonspecific with follow-up imaging recommended.  She has undergone PET scan for evaluation of the pulmonary nodule and this showed mediastinal LN and left pulmonary nodule.  She has been seen by CT surgery with recommendations for additional work-up.    3.  Hypertension.  BP is 124/64.  Follow-up with PCP.    4  PAC.  She will undergo port flush today.    5.  Health maintenance.  She reports she is scheduled for EGD on 08/04/2017 with GI.    Greater than 35 minutes spent with more than 50% spent in counseling and coordination of care.     INTERVAL HISTORY: 84 y.o. female returns for followup of Stage IIA ER/PR+, HER2 neg (T1 C. N1 M0) adenocarcinoma of the left breast with surgery on 03/08/2001. She had a 1.5 cm  cancer with 1/6 positive sentinel nodes. Reexcision margins were clear. She did participate in NSABP B.-30 randomized to Adriamycin and Taxotere for 4 cycles followed by radiation therapy from 07/13/2001 to 08/27/2001. She then started Arimidex on 07/07/2001 took that until the end of June 2008.   She underwent a screening bilateral mammogram on 07/28/2016 which demonstrated in the left breast a possible asymmetry with calcification. She then underwent a left diagnostic mammogram on 08/12/2016 targeted ultrasound which demonstrated a hypoechoic irregular mass with indistinct margins at 6:30 position 1 cm from the nipple measuring 1.2 x 1.8 x 0.8 cm. Ultrasound of the left axilla is negative for lymphadenopathy. Patient underwent biopsy of the left breast mass on 08/20/2016 with prognostic panel ER negative, PR negative, HER-2 negative by FISH, invasive ductal carcinoma with DCIS. Patient did not palpate any breast masses herself.  Patient was seen by Dr. Arnoldo Morale and underwent left simple mastectomy on 01/16/2017 with pathology returning as invasive ductal carcinoma grade 3 measuring 1.8 cm.  She had evidence of DCIS grade 3 lymphovascular invasion was seen.  Margins were negative for cancer.  Tumor was ER PR HER-2 negative.  Ki-67 was  30%.   Current Status:  Pt is seen today for follow-up.     Malignant neoplasm of lower-inner quadrant of left female breast ([place])   08/21/2016 Pathology Results    Left needle core biopsy, lower-inner quadrant: invasive ductal carcinoma and DCIS.  ER/PR-, HER2-.       01/16/2017 Surgery    Left simple mastectomy by Dr. Alean Rinne ductal carcinoma, grade 3, measuring 1.8 cm with DCIS (high-grade).  Lymphovascular invasion is identified.  Clear surgical margins.        Problem List Patient Active Problem List   Diagnosis Date Noted  . Odynophagia [R13.10] 06/04/2017  . GERD (gastroesophageal reflux disease) [K21.9] 06/04/2017  . Malignant neoplasm of  lower-inner quadrant of left female breast ([place]) [C50.312]   . Acute MI, inferoposterior wall ([place]) [I21.19] 12/21/2012  . History of left breast cancer (2003) [Z85.3] 11/23/2012  . Bursitis, shoulder [M75.50] 05/25/2012    Past Medical History Past Medical History:  Diagnosis Date  . Acute MI ([place]) 2003  . Adenocarcinoma of left breast ([place]) 11/23/2012   Stage II (T1 C. N1 M0) adenocarcinoma of the left breast with surgery on 03/08/2001. She had a 1.5 cm cancer with 1/6 positive sentinel nodes. Reexcision margins were clear. She did participate in NSABP B.-30 randomized to Adriamycin and Taxotere for 4 cycles followed by radiation therapy from 07/13/2001 to 08/27/2001. She then started Arimidex on 07/07/2001 took that until the end of June 2008. T  . Anemia   . Bilateral cataracts   . Breast cancer ([place])   . GERD (gastroesophageal reflux disease)   . History of gout   . Hypercholesteremia   . Hypertension   . Malignant neoplasm of lower-inner quadrant of left female breast ([place])   . Osteoporosis     Past Surgical History Past Surgical History:  Procedure Laterality Date  . ABDOMINAL HYSTERECTOMY    . BREAST SURGERY    . CATARACT EXTRACTION W/PHACO Left 09/04/2014   Procedure: CATARACT EXTRACTION PHACO AND INTRAOCULAR LENS PLACEMENT (IOC);  Surgeon: Tonny Branch, MD;  Location: [facility];  Service: Ophthalmology;  Laterality: Left;  CDE:9.84  . CATARACT EXTRACTION W/PHACO Right 11/09/2014   Procedure: CATARACT EXTRACTION PHACO AND INTRAOCULAR LENS PLACEMENT (IOC);  Surgeon: Tonny Branch, MD;  Location: [facility];  Service: Ophthalmology;  Laterality: Right;  CDE:8.40  . CORONARY ANGIOPLASTY     RCA stent  . LEFT HEART CATHETERIZATION WITH CORONARY ANGIOGRAM N/A 12/21/2012   Procedure: LEFT HEART CATHETERIZATION WITH CORONARY ANGIOGRAM;  Surgeon: Clent Demark, MD;  Location: [place] CATH [facility];  Service: Cardiovascular;  Laterality: N/A;  . MASTECTOMY MODIFIED RADICAL Left 01/16/2017   Procedure:  SIMPLE MASTECTOMY;  Surgeon: Aviva Signs, MD;  Location: [facility];  Service: General;  Laterality: Left;  Marland Kitchen MASTECTOMY, PARTIAL Left   . PERCUTANEOUS STENT INTERVENTION     RCA  . PORT-A-CATH REMOVAL    . PORTACATH PLACEMENT    . PORTACATH PLACEMENT Right 03/11/2017   Procedure: INSERTION PORT-A-CATH;  Surgeon: Aviva Signs, MD;  Location: [facility];  Service: General;  Laterality: Right;    Family History Family History  Problem Relation Age of Onset  . Stroke Mother   . Colon cancer Neg Hx   . Gastric cancer Neg Hx   . Esophageal cancer Neg Hx      Social History  reports that she has been smoking cigarettes.  She has a 25.00 pack-year smoking history. She has never used smokeless tobacco. She reports that she does not drink  alcohol or use drugs.  Medications  Current Outpatient Medications:  .  Artificial Tear Solution (SOOTHE XP OP), Place 1-2 drops into both eyes as needed (dry eye). , Disp: , Rfl:  .  aspirin EC 81 MG EC tablet, Take 1 tablet (81 mg total) by mouth daily., Disp: 30 tablet, Rfl: 3 .  atorvastatin (LIPITOR) 40 MG tablet, Take 40 mg by mouth daily. , Disp: , Rfl:  .  CARTIA XT 180 MG 24 hr capsule, Take 180 mg by mouth daily., Disp: , Rfl:  .  Cyanocobalamin (B-12 COMPLIANCE INJECTION IJ), Inject as directed every 30 (thirty) days., Disp: , Rfl:  .  pantoprazole (PROTONIX) 40 MG tablet, Take 40 mg by mouth daily as needed (acid reflux). , Disp: , Rfl:  .  Vitamin D, Ergocalciferol, (DRISDOL) 50000 units CAPS capsule, Take 50,000 Units by mouth every Wednesday., Disp: , Rfl:   Current Facility-Administered Medications:  .  sodium chloride flush (NS) 0.9 % injection 10 mL, 10 mL, Intravenous, PRN, Kalaysia Demonbreun, Mathis Dad, MD  Facility-Administered Medications Ordered in Other Visits:  .  heparin lock flush 100 unit/mL, 500 Units, Intravenous, Once, Savvas Roper, MD .  sodium chloride flush (NS) 0.9 % injection 10 mL, 10 mL, Intravenous, PRN, Miller Edgington, MD, 10 mL at  07/29/17 1510  Allergies Azithromycin  Review of Systems Review of Systems - Oncology ROS negative other than heart problems.     Physical Exam  Vitals Wt Readings from Last 3 Encounters:  07/29/17 125 lb (56.7 kg)  06/04/17 124 lb 6.4 oz (56.4 kg)  04/09/17 125 lb (56.7 kg)   Temp Readings from Last 3 Encounters:  07/29/17 98.3 F (36.8 C) (Oral)  06/04/17 98.1 F (36.7 C) (Oral)  03/30/17 98.1 F (36.7 C) (Oral)   BP Readings from Last 3 Encounters:  07/29/17 124/64  06/04/17 126/63  05/04/17 137/70   Pulse Readings from Last 3 Encounters:  07/29/17 76  06/04/17 89  04/09/17 92    Constitutional: Well-developed, well-nourished, and in no distress.   HENT: Head: Normocephalic and atraumatic.  Mouth/Throat: No oropharyngeal exudate. Mucosa moist. Eyes: Pupils are equal, round, and reactive to light. Conjunctivae are normal. No scleral icterus.  Neck: Normal range of motion. Neck supple. No JVD present.  Cardiovascular: Normal rate, regular rhythm and normal heart sounds.  Exam reveals no gallop and no friction rub.   No murmur heard. Pulmonary/Chest: Effort normal and breath sounds normal. No respiratory distress. No wheezes.No rales.  Abdominal: Soft. Bowel sounds are normal. No distension. There is no tenderness. There is no guarding.  Musculoskeletal: No edema or tenderness.  Lymphadenopathy: No cervical, axillary or supraclavicular adenopathy.  Neurological: Alert and oriented to person, place, and time. No cranial nerve deficit.  Skin: Skin is warm and dry. No rash noted. No erythema. No pallor.  Psychiatric: Affect and judgment normal.  Bilateral breast exam:  Chaperone present.  Left mastectomy site healed.  No signs of chest wall recurrence.  Right breast shows no dominant masses.    [facility] Outpatient Visit on 07/29/2017  Component Date Value Ref Range Status  . WBC 07/29/2017 7.7  4.0 - 10.5 K/uL Final  . RBC 07/29/2017 4.26  3.87 - 5.11 MIL/uL  Final  . Hemoglobin 07/29/2017 13.0  12.0 - 15.0 g/dL Final  . HCT 07/29/2017 40.1  36.0 - 46.0 % Final  . MCV 07/29/2017 94.1  78.0 - 100.0 fL Final  . MCH 07/29/2017 30.5  26.0 - 34.0 pg Final  .  MCHC 07/29/2017 32.4  30.0 - 36.0 g/dL Final  . RDW 07/29/2017 12.9  11.5 - 15.5 % Final  . Platelets 07/29/2017 270  150 - 400 K/uL Final  . Neutrophils Relative % 07/29/2017 48  % Final  . Neutro Abs 07/29/2017 3.7  1.7 - 7.7 K/uL Final  . Lymphocytes Relative 07/29/2017 40  % Final  . Lymphs Abs 07/29/2017 3.1  0.7 - 4.0 K/uL Final  . Monocytes Relative 07/29/2017 9  % Final  . Monocytes Absolute 07/29/2017 0.7  0.1 - 1.0 K/uL Final  . Eosinophils Relative 07/29/2017 3  % Final  . Eosinophils Absolute 07/29/2017 0.2  0.0 - 0.7 K/uL Final  . Basophils Relative 07/29/2017 0  % Final  . Basophils Absolute 07/29/2017 0.0  0.0 - 0.1 K/uL Final   Performed at [facility], [address] [place], [place] [address]  . Sodium 07/29/2017 139  135 - 145 mmol/L Final  . Potassium 07/29/2017 3.8  3.5 - 5.1 mmol/L Final  . Chloride 07/29/2017 106  98 - 111 mmol/L Final   Please note change in reference range.  . CO2 07/29/2017 24  22 - 32 mmol/L Final  . Glucose, Bld 07/29/2017 85  70 - 99 mg/dL Final   Please note change in reference range.  . BUN 07/29/2017 9  8 - 23 mg/dL Final   Please note change in reference range.  . Creatinine, Ser 07/29/2017 0.96  0.44 - 1.00 mg/dL Final  . Calcium 07/29/2017 9.3  8.9 - 10.3 mg/dL Final  . GFR calc non Af Amer 07/29/2017 54* >60 mL/min Final  . GFR calc Af Amer 07/29/2017 >60  >60 mL/min Final   Comment: (NOTE) The eGFR has been calculated using the CKD EPI equation. This calculation has not been validated in all clinical situations. eGFR's persistently <60 mL/min signify possible Chronic Kidney Disease.   Georgiann Hahn gap 07/29/2017 9  5 - 15 Final   Performed at [facility], [address], [place], [place] [address]     Pathology Orders Placed This  Encounter  Procedures  . NM PET Image Restag (PS) Skull Base To Thigh    Standing Status:   Future    Standing Expiration Date:   07/29/2018    Order Specific Question:   If indicated for the ordered procedure, I authorize the administration of a radiopharmaceutical per Radiology protocol    Answer:   Yes    Order Specific Question:   Preferred imaging location?    Answer:   [facility]    Order Specific Question:   Radiology Contrast Protocol - do NOT remove file path    Answer:   [email or web address]  . Schedule Portacath Flush Appointment    Schedule Portacath flush appointment       Zoila Shutter MD

## 2017-07-29 NOTE — Progress Notes (Signed)
Margaret Thompson presented for Portacath access and flush.  Portacath located right chest wall accessed with  H 20 needle.  Good blood return present. Portacath flushed with 73ml NS and 500U/5ml Heparin and needle removed intact.  Procedure tolerated well and without incident.  Discharged ambulatory in c/o family.

## 2017-08-03 NOTE — Progress Notes (Signed)
CC'D TO PCP °

## 2017-08-04 ENCOUNTER — Ambulatory Visit (HOSPITAL_COMMUNITY)
Admission: RE | Admit: 2017-08-04 | Discharge: 2017-08-04 | Disposition: A | Discharge status: Home / Self Care | Payer: Medicare Other | Source: Ambulatory Visit | Attending: Gastroenterology | Admitting: Gastroenterology

## 2017-08-04 ENCOUNTER — Ambulatory Visit (HOSPITAL_COMMUNITY): Payer: Medicare Other | Admitting: Anesthesiology

## 2017-08-04 ENCOUNTER — Encounter (HOSPITAL_COMMUNITY)
Admission: RE | Disposition: A | Discharge status: Home / Self Care | Payer: Self-pay | Source: Ambulatory Visit | Attending: Gastroenterology

## 2017-08-04 ENCOUNTER — Encounter (HOSPITAL_COMMUNITY): Payer: Self-pay | Admitting: *Deleted

## 2017-08-04 ENCOUNTER — Other Ambulatory Visit: Payer: Self-pay

## 2017-08-04 DIAGNOSIS — I10 Essential (primary) hypertension: Secondary | ICD-10-CM | POA: Insufficient documentation

## 2017-08-04 DIAGNOSIS — K2970 Gastritis, unspecified, without bleeding: Secondary | ICD-10-CM

## 2017-08-04 DIAGNOSIS — E7800 Pure hypercholesterolemia, unspecified: Secondary | ICD-10-CM | POA: Diagnosis not present

## 2017-08-04 DIAGNOSIS — K209 Esophagitis, unspecified: Secondary | ICD-10-CM | POA: Diagnosis not present

## 2017-08-04 DIAGNOSIS — K222 Esophageal obstruction: Secondary | ICD-10-CM

## 2017-08-04 DIAGNOSIS — Z853 Personal history of malignant neoplasm of breast: Secondary | ICD-10-CM | POA: Insufficient documentation

## 2017-08-04 DIAGNOSIS — Z881 Allergy status to other antibiotic agents status: Secondary | ICD-10-CM | POA: Insufficient documentation

## 2017-08-04 DIAGNOSIS — M810 Age-related osteoporosis without current pathological fracture: Secondary | ICD-10-CM | POA: Diagnosis not present

## 2017-08-04 DIAGNOSIS — Z79899 Other long term (current) drug therapy: Secondary | ICD-10-CM | POA: Diagnosis not present

## 2017-08-04 DIAGNOSIS — K2980 Duodenitis without bleeding: Secondary | ICD-10-CM | POA: Diagnosis not present

## 2017-08-04 DIAGNOSIS — F17210 Nicotine dependence, cigarettes, uncomplicated: Secondary | ICD-10-CM | POA: Diagnosis not present

## 2017-08-04 DIAGNOSIS — R1310 Dysphagia, unspecified: Secondary | ICD-10-CM | POA: Diagnosis not present

## 2017-08-04 DIAGNOSIS — Z7982 Long term (current) use of aspirin: Secondary | ICD-10-CM | POA: Insufficient documentation

## 2017-08-04 DIAGNOSIS — K210 Gastro-esophageal reflux disease with esophagitis: Secondary | ICD-10-CM | POA: Diagnosis not present

## 2017-08-04 DIAGNOSIS — K449 Diaphragmatic hernia without obstruction or gangrene: Secondary | ICD-10-CM | POA: Diagnosis not present

## 2017-08-04 DIAGNOSIS — K2950 Unspecified chronic gastritis without bleeding: Secondary | ICD-10-CM | POA: Diagnosis not present

## 2017-08-04 HISTORY — PX: ESOPHAGOGASTRODUODENOSCOPY (EGD) WITH PROPOFOL: SHX5813

## 2017-08-04 HISTORY — PX: SAVORY DILATION: SHX5439

## 2017-08-04 SURGERY — ESOPHAGOGASTRODUODENOSCOPY (EGD) WITH PROPOFOL
Anesthesia: Monitor Anesthesia Care

## 2017-08-04 MED ORDER — STERILE WATER FOR IRRIGATION IR SOLN
Status: DC | PRN
  Administered 2017-08-04: 100 mL

## 2017-08-04 MED ORDER — PROPOFOL 10 MG/ML IV BOLUS
INTRAVENOUS | Status: DC | PRN
  Administered 2017-08-04 (×7): 20 mg via INTRAVENOUS

## 2017-08-04 MED ORDER — LACTATED RINGERS IV SOLN
INTRAVENOUS | Status: DC
  Administered 2017-08-04: 07:00:00 via INTRAVENOUS

## 2017-08-04 MED ORDER — PROPOFOL 500 MG/50ML IV EMUL
INTRAVENOUS | Status: DC | PRN
  Administered 2017-08-04 (×2): 45 ug/kg/min via INTRAVENOUS

## 2017-08-04 MED ORDER — MINERAL OIL PO OIL
TOPICAL_OIL | ORAL | Status: AC
  Filled 2017-08-04: qty 30

## 2017-08-04 MED ORDER — PROPOFOL 10 MG/ML IV BOLUS
INTRAVENOUS | Status: AC
  Filled 2017-08-04: qty 140

## 2017-08-04 NOTE — Anesthesia Postprocedure Evaluation (Signed)
Anesthesia Post Note  Patient: GAYE SCORZA  Procedure(s) Performed: ESOPHAGOGASTRODUODENOSCOPY (EGD) WITH PROPOFOL (N/A ) SAVORY DILATION (N/A )  Patient location during evaluation: Short Stay Anesthesia Type: MAC Level of consciousness: awake and alert and patient cooperative Pain management: satisfactory to patient (Patient states her throat is sore.) Vital Signs Assessment: post-procedure vital signs reviewed and stable Respiratory status: spontaneous breathing Cardiovascular status: stable Postop Assessment: no apparent nausea or vomiting Anesthetic complications: no     Last Vitals:  Vitals:   08/04/17 0633 08/04/17 0810  BP: 139/67 (!) 146/79  Pulse: 77 86  Resp: 18 18  Temp: 36.5 C 36.6 C  SpO2: 95% 96%    Last Pain:  Vitals:   08/04/17 0810  TempSrc:   PainSc: 0-No pain                 Sulma Ruffino

## 2017-08-04 NOTE — Anesthesia Preprocedure Evaluation (Signed)
Anesthesia Evaluation  Patient identified by MRN, date of birth, ID band Patient awake    Reviewed: Allergy & Precautions, H&P , NPO status , Patient's Chart, lab work & pertinent test results, reviewed documented beta blocker date and time   Airway Mallampati: II  TM Distance: >3 FB Neck ROM: full    Dental no notable dental hx. (+) Edentulous Upper, Poor Dentition, Chipped, Missing   Pulmonary neg pulmonary ROS, Current Smoker,    Pulmonary exam normal breath sounds clear to auscultation       Cardiovascular Exercise Tolerance: Good hypertension, + Past MI and + Cardiac Stents  negative cardio ROS   Rhythm:regular Rate:Normal     Neuro/Psych negative neurological ROS  negative psych ROS   GI/Hepatic negative GI ROS, Neg liver ROS, GERD  ,  Endo/Other  negative endocrine ROS  Renal/GU negative Renal ROS  negative genitourinary   Musculoskeletal   Abdominal   Peds  Hematology negative hematology ROS (+) anemia ,   Anesthesia Other Findings Echo:  IMPRESSION:   1. No reversible ischemia or infarction.  2. Normal left ventricular wall motion.  3. Left ventricular ejection fraction 46%  4. Non invasive risk stratification*: Low   Reproductive/Obstetrics negative OB ROS                             Anesthesia Physical Anesthesia Plan  ASA: III  Anesthesia Plan: MAC   Post-op Pain Management:    Induction:   PONV Risk Score and Plan:   Airway Management Planned:   Additional Equipment:   Intra-op Plan:   Post-operative Plan:   Informed Consent: I have reviewed the patients History and Physical, chart, labs and discussed the procedure including the risks, benefits and alternatives for the proposed anesthesia with the patient or authorized representative who has indicated his/her understanding and acceptance.     Plan Discussed with: CRNA  Anesthesia Plan Comments:          Anesthesia Quick Evaluation

## 2017-08-04 NOTE — Op Note (Signed)
[facility] Patient Name: Margaret Thompson Procedure Date: 08/04/2017 7:10 AM MRN: [id number] Date of Birth: September 11, 1933 Attending MD: Barney Drain MD, MD CSN: [id number] Age: 84 Admit Type: Outpatient Procedure:                Upper GI endoscopy WITH COLD FORCEPS                            BIOPSY/ESOPHAGEAL DILATION Indications:              Dysphagia, Odynophagia. USES ASA DAILY WITH PRN                            PPI. PMHX: GERD/TOBACCO ABUSE Providers:                Barney Drain MD, MD, Rosina Lowenstein, RN, Aram Candela Referring MD:             Redmond [employer], MD Medicines:                Propofol per Anesthesia Complications:            No immediate complications. Estimated Blood Loss:     Estimated blood loss was minimal. Procedure:                Pre-Anesthesia Assessment:                           - Prior to the procedure, a History and Physical                            was performed, and patient medications and                            allergies were reviewed. The patient's tolerance of                            previous anesthesia was also reviewed. The risks                            and benefits of the procedure and the sedation                            options and risks were discussed with the patient.                            All questions were answered, and informed consent                            was obtained. Prior Anticoagulants: The patient has                            taken aspirin, last dose was 1 day prior to                            procedure. ASA Grade Assessment: II - A patient  with mild systemic disease. After reviewing the                            risks and benefits, the patient was deemed in                            satisfactory condition to undergo the procedure.                            After obtaining informed consent, the endoscope was                            passed under direct vision. Throughout the                            procedure, the patient's blood pressure, pulse, and                            oxygen saturations were monitored continuously. The                            GIF-[id number] ([id number]) was introduced through the                            mouth, and advanced to the second part of duodenum.                            The upper GI endoscopy was somewhat difficult due                            to the patient's agitation. Successful completion                            of the procedure was aided by increasing the dose                            of sedation medication. The patient tolerated the                            procedure fairly well. Scope In: 7:41:28 AM Scope Out: 7:58:07 AM Total Procedure Duration: 0 hours 16 minutes 39 seconds  Findings:      A moderate Schatzki ring was found at the gastroesophageal junction WITH       MILD INFLAMMATION. Biopsies were taken with a cold forceps for       histology. A guidewire was placed and the scope was withdrawn. Dilation       was performed with a Savary dilator with mild resistance at 14 mm, 15       mm, 16 mm and 17 mm. Estimated blood loss was minimal.      A medium-sized hiatal hernia was present.      Diffuse moderate inflammation characterized by congestion (edema),       erosions and erythema was found in the entire examined stomach. Biopsies       were taken with a  cold forceps for Helicobacter pylori testing.      Patchy mild inflammation characterized by congestion (edema) and       erythema was found in the entire duodenum. Impression:               - Moderate Schatzki ring/MILD ESOPHAGITIS.                           - Medium-sized hiatal hernia.                           - MODERATE Gastritis/MILD DUODENITIS DUE TO ASA USE                            WITH PRN PPI. Marland Kitchen Moderate Sedation:      Per Anesthesia Care Recommendation:           - Await pathology results.                           - Low fat diet.                            - Continue present medications. HOLD ASA FOR 3 DAYS.                           - Use Protonix (pantoprazole) 40 mg PO daily                            indefinitely.                           - Return to my office in 4 months.                           - Patient has a contact number available for                            emergencies. The signs and symptoms of potential                            delayed complications were discussed with the                            patient. Return to normal activities tomorrow.                            Written discharge instructions were provided to the                            patient. Procedure Code(s):        --- Professional ---                           [id number], Esophagogastroduodenoscopy, flexible,                            transoral; with  insertion of guide wire followed by                            passage of dilator(s) through esophagus over guide                            wire                           43239, Esophagogastroduodenoscopy, flexible,                            transoral; with biopsy, single or multiple Diagnosis Code(s):        --- Professional ---                           K22.2, Esophageal obstruction                           K44.9, Diaphragmatic hernia without obstruction or                            gangrene                           K29.70, Gastritis, unspecified, without bleeding                           K29.80, Duodenitis without bleeding                           R13.10, Dysphagia, unspecified CPT copyright 2017 [employer] Medical Association. All rights reserved. The codes documented in this report are preliminary and upon coder review may  be revised to meet current compliance requirements. Barney Drain, MD Barney Drain MD, MD 08/04/2017 8:17:33 AM This report has been signed electronically. Number of Addenda: 0

## 2017-08-04 NOTE — Transfer of Care (Signed)
Immediate Anesthesia Transfer of Care Note  Patient: Margaret Thompson  Procedure(s) Performed: ESOPHAGOGASTRODUODENOSCOPY (EGD) WITH PROPOFOL (N/A ) SAVORY DILATION (N/A )  Patient Location: PACU  Anesthesia Type:MAC  Level of Consciousness: awake, alert  and patient cooperative  Airway & Oxygen Therapy: Patient Spontanous Breathing and Patient connected to nasal cannula oxygen  Post-op Assessment: Report given to RN and Post -op Vital signs reviewed and stable  Post vital signs: Reviewed and stable  Last Vitals:  Vitals Value Taken Time  BP    Temp    Pulse 91 08/04/2017  8:06 AM  Resp 21 08/04/2017  8:06 AM  SpO2 96 % 08/04/2017  8:06 AM  Vitals shown include unvalidated device data.  Last Pain:  Vitals:   08/04/17 0800  TempSrc:   PainSc: 0-No pain         Complications: No apparent anesthesia complications

## 2017-08-04 NOTE — Anesthesia Procedure Notes (Signed)
Procedure Name: MAC Date/Time: 08/04/2017 7:31 AM Performed by: Vista Deck, CRNA Pre-anesthesia Checklist: Patient identified, Emergency Drugs available, Suction available, Timeout performed and Patient being monitored Patient Re-evaluated:Patient Re-evaluated prior to induction Oxygen Delivery Method: Nasal Cannula

## 2017-08-04 NOTE — Discharge Instructions (Signed)
I dilated your esophagus. 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  You have A SMALL HIATAL HERNIA AND MODERATE gastritis/DUODENITIS DUE TO ASPIRIN USE. I biopsied your ESOPHAGUS AND stomach.   HOLD ASPIRIN. RE-START JUL 27.  DRINK WATER TO KEEP YOUR URINE LIGHT YELLOW.  AVOID REFLUX TRIGGERS. SEE INFO BELOW.  STRICTLY FOLLOW A LOW FAT DIET. SEE INFO BELOW.  TO PREVENT REFLUX, TREAT GASTRITIS/DUIODENITIS, AND PREVENT ULCERS DUE TO ASPIRIN USE, CONTINUE PROTONIX. TAKE 30 MINUTES PRIOR TO BREAKFAST EVERY DAY.  YOUR BIOPSY WILL BE BACK IN 7 DAYS  FOLLOW UP IN 4 MOS.  UPPER ENDOSCOPY AFTER CARE Read the instructions outlined below and refer to this sheet in the next week. These discharge instructions provide you with general information on caring for yourself after you leave the hospital. While your treatment has been planned according to the most current medical practices available, unavoidable complications occasionally occur. If you have any problems or questions after discharge, call DR. Seham Gardenhire, [phone].  ACTIVITY  You may resume your regular activity, but move at a slower pace for the next 24 hours.   Take frequent rest periods for the next 24 hours.   Walking will help get rid of the air and reduce the bloated feeling in your belly (abdomen).   No driving for 24 hours (because of the medicine (anesthesia) used during the test).   You may shower.   Do not sign any important legal documents or operate any machinery for 24 hours (because of the anesthesia used during the test).    NUTRITION  Drink plenty of fluids.   You may resume your normal diet as instructed by your doctor.   Begin with a light meal and progress to your normal diet. Heavy or fried foods are harder to digest and may make you feel sick to your stomach (nauseated).   Avoid alcoholic beverages for 24 hours or as instructed.     MEDICATIONS  You may resume your normal medications.   WHAT YOU CAN EXPECT TODAY  Some feelings of bloating in the abdomen.   Passage of more gas than usual.    IF YOU HAD A BIOPSY TAKEN DURING THE UPPER ENDOSCOPY:  Eat a soft diet IF YOU HAVE NAUSEA, BLOATING, ABDOMINAL PAIN, OR VOMITING.    FINDING OUT THE RESULTS OF YOUR TEST Not all test results are available during your visit. DR. Oneida Alar WILL CALL YOU WITHIN 14 DAYS OF YOUR PROCEDUE WITH YOUR RESULTS. Do not assume everything is normal if you have not heard from DR. Thersa Mohiuddin, CALL HER OFFICE AT [phone].  SEEK IMMEDIATE MEDICAL ATTENTION AND CALL THE OFFICE: [phone] IF:  You have more than a spotting of blood in your stool.   Your belly is swollen (abdominal distention).   You are nauseated or vomiting.   You have a temperature over 101F.   You have abdominal pain or discomfort that is severe or gets worse throughout the day.  Gastritis/DUODENITIS  Gastritis is an inflammation (the body's way of reacting to injury and/or infection) of the stomach. DUODENITIS is an inflammation (the body's way of reacting to injury and/or infection) of the FIRST PART OF THE SMALL INTESTINES. It is often caused by bacterial (germ) infections. It can also be caused BY ASPIRIN, BC/GOODY POWDER'S, (IBUPROFEN) MOTRIN, OR ALEVE (NAPROXEN), chemicals (including alcohol), SPICY FOODS, and medications. This illness may be associated with generalized malaise (feeling tired, not well), UPPER ABDOMINAL STOMACH cramps,  and fever. One common bacterial cause of gastritis is an organism known as H. Pylori. This can be treated with antibiotics.   ESOPHAGEAL STRICTURE  Esophageal strictures can be caused by stomach acid backing up into the tube that carries food from the mouth down to the stomach (lower esophagus).  TREATMENT There are a number of  medicines used to treat reflux/stricture, including: Antacids.  Proton-pump inhibitors:  PROTONIX PEPCID OR ZANTAC       Lifestyle and home remedies TO CONTROL REFLUX/HEARTBURN You may eliminate or reduce the frequency of heartburn by making the following lifestyle changes:   Control your weight. Being overweight is a major risk factor for heartburn and GERD. Excess pounds put pressure on your abdomen, pushing up your stomach and causing acid to back up into your esophagus.    Eat smaller meals. 4 TO 6 MEALS A DAY. This reduces pressure on the lower esophageal sphincter, helping to prevent the valve from opening and acid from washing back into your esophagus.    Loosen your belt. Clothes that fit tightly around your waist put pressure on your abdomen and the lower esophageal sphincter.    Eliminate heartburn triggers. Everyone has specific triggers. Common triggers such as fatty or fried foods, spicy food, tomato sauce, carbonated beverages, alcohol, chocolate, mint, garlic, onion, caffeine and nicotine may make heartburn worse.    Avoid stooping or bending. Tying your shoes is OK. Bending over for longer periods to weed your garden isn't, especially soon after eating.    Don't lie down after a meal. Wait at least three to four hours after eating before going to bed, and don't lie down right after eating.    Alternative medicine  Several home remedies exist for treating GERD, but they provide only temporary relief. They include drinking baking soda (sodium bicarbonate) added to water or drinking other fluids such as baking soda mixed with cream of tartar and water.   Although these liquids create temporary relief by neutralizing, washing away or buffering acids, eventually they aggravate the situation by adding gas and fluid to your stomach, increasing pressure and causing more acid reflux. Further, adding more sodium to your diet may increase your blood pressure and add stress to your heart, and excessive bicarbonate ingestion can alter the acid-base balance in your  body.   Low-Fat Diet  BREADS, CEREALS, PASTA, RICE, DRIED PEAS, AND BEANS  These products are high in carbohydrates and most are low in fat. Therefore, they can be increased in the diet as substitutes for fatty foods. They too, however, contain calories and should not be eaten in excess. Cereals can be eaten for snacks as well as for breakfast.   Include foods that contain fiber (fruits, vegetables, whole grains, and legumes). Research shows that fiber may lower blood cholesterol levels, especially the water-soluble fiber found in fruits, vegetables, oat products, and legumes.  FRUITS AND VEGETABLES  It is good to eat fruits and vegetables. Besides being sources of fiber, both are rich in vitamins and some minerals. They help you get the daily allowances of these nutrients. Fruits and vegetables can be used for snacks and desserts.  MEATS  Limit lean meat, chicken, [place], and fish to no more than 6 ounces per day.  Beef, Pork, and Lamb  Use lean cuts of beef, pork, and lamb. Lean cuts include:   Extra-lean ground beef.   Arm roast.   Sirloin tip.   Center-cut ham.   Round steak.   Loin chops.  Rump roast.   Tenderloin.   Trim all fat off the outside of meats before cooking. It is not necessary to severely decrease the intake of red meat, but lean choices should be made. Lean meat is rich in protein and contains a highly absorbable form of iron. Premenopausal women, in particular, should avoid reducing lean red meat because this could increase the risk for low red blood cells (iron-deficiency anemia).  Processed Meats  Processed meats, such as bacon, bologna, salami, sausage, and hot dogs contain large quantities of fat, are not rich in valuable nutrients, and should not be eaten very often.  Organ Meats  The organ meats, such as liver, sweetbreads, kidneys, and brain are very rich in cholesterol. They should be limited.  Chicken and [place]  These are good sources  of protein. The fat of poultry can be reduced by removing the skin and underlying fat layers before cooking. Chicken and [place] can be substituted for lean red meat in the diet. Poultry should not be fried or covered with high-fat sauces.  Fish and Shellfish  Fish is a good source of protein. Shellfish contain cholesterol, but they usually are low in saturated fatty acids. The preparation of fish is important. Like chicken and [place], they should not be fried or covered with high-fat sauces.  EGGS  Egg yolks often are hidden in cooked and processed foods. Egg whites contain no fat or cholesterol. They can be eaten often. Try 1 to 2 egg whites instead of whole eggs in recipes or use egg substitutes that do not contain yolk.  MILK AND DAIRY PRODUCTS  Use skim or 1% milk instead of 2% or whole milk. Decrease whole milk, natural, and processed cheeses. Use nonfat or low-fat (2%) cottage cheese or low-fat cheeses made from vegetable oils. Choose nonfat or low-fat (1 to 2%) yogurt. Experiment with evaporated skim milk in recipes that call for heavy cream. Substitute low-fat yogurt or low-fat cottage cheese for sour cream in dips and salad dressings. Have at least 2 servings of low-fat dairy products, such as 2 glasses of skim (or 1%) milk each day to help get your daily calcium intake.  FATS AND OILS  Reduce the total intake of fats, especially saturated fat. Butterfat, lard, and beef fats are high in saturated fat and cholesterol. These should be avoided as much as possible. Vegetable fats do not contain cholesterol, but certain vegetable fats, such as coconut oil, palm oil, and palm kernel oil are very high in saturated fats. These should be limited. These fats are often used in bakery goods, processed foods, popcorn, oils, and nondairy creamers. Vegetable shortenings and some peanut butters contain hydrogenated oils, which are also saturated fats. Read the labels on these foods and check for saturated  vegetable oils.  Unsaturated vegetable oils and fats do not raise blood cholesterol. However, they should be limited because they are fats and are high in calories. Total fat should still be limited to 30% of your daily caloric intake. Desirable liquid vegetable oils are corn oil, cottonseed oil, olive oil, canola oil, safflower oil, soybean oil, and sunflower oil. Peanut oil is not as good, but small amounts are acceptable. Buy a heart-healthy tub margarine that has no partially hydrogenated oils in the ingredients. Mayonnaise and salad dressings often are made from unsaturated fats, but they should also be limited because of their high calorie and fat content.  Seeds, nuts, peanut butter, olives, and avocados are high in fat, but the fat is mainly  the unsaturated type. These foods should be limited mainly to avoid excess calories and fat.  OTHER EATING TIPS  Snacks   Most sweets should be limited as snacks. They tend to be rich in calories and fats, and their caloric content outweighs their nutritional value. Some good choices in snacks are graham crackers, melba toast, soda crackers, bagels (no egg), English muffins, fruits, and vegetables. These snacks are preferable to snack crackers, [place] fries, and chips. Popcorn should be air-popped or cooked in small amounts of liquid vegetable oil.  Desserts  Eat fruit, low-fat yogurt, and fruit ices instead of pastries, cake, and cookies. Sherbet, angel food cake, gelatin dessert, frozen low-fat yogurt, or other frozen products that do not contain saturated fat (pure fruit juice bars, frozen ice pops) are also acceptable.   COOKING METHODS  Choose those methods that use little or no fat. They include:  Poaching.   Braising.   Steaming.   Grilling.   Baking.   Stir-frying.   Broiling.   Microwaving.   Foods can be cooked in a nonstick pan without added fat, or use a nonfat cooking spray in regular cookware. Limit fried foods and avoid  frying in saturated fat. Add moisture to lean meats by using water, broth, cooking wines, and other nonfat or low-fat sauces along with the cooking methods mentioned above.  Soups and stews should be chilled after cooking. The fat that forms on top after a few hours in the refrigerator should be skimmed off. When preparing meals, avoid using excess salt. Salt can contribute to raising blood pressure in some people.  EATING AWAY FROM HOME  Order entres, potatoes, and vegetables without sauces or butter. When meat exceeds the size of a deck of cards (3 to 4 ounces), the rest can be taken home for another meal.  Choose vegetable or fruit salads and ask for low-calorie salad dressings to be served on the side. Use dressings sparingly. Limit high-fat toppings, such as bacon, crumbled eggs, cheese, sunflower seeds, and olives. Ask for heart-healthy tub margarine instead of butter.

## 2017-08-04 NOTE — H&P (Signed)
Primary Care Physician:  Redmond School, MD Primary Gastroenterologist:  Dr. Oneida Alar  Pre-Procedure History & Physical: HPI:  Margaret Thompson is a 84 y.o. female here for DYSPHAGIA/ODYNOPHAGIA  Past Medical History:  Diagnosis Date  . Acute MI ([place]) 2003  . Adenocarcinoma of left breast ([place]) 11/23/2012   Stage II (T1 C. N1 M0) adenocarcinoma of the left breast with surgery on 03/08/2001. She had a 1.5 cm cancer with 1/6 positive sentinel nodes. Reexcision margins were clear. She did participate in NSABP B.-30 randomized to Adriamycin and Taxotere for 4 cycles followed by radiation therapy from 07/13/2001 to 08/27/2001. She then started Arimidex on 07/07/2001 took that until the end of June 2008. T  . Anemia   . Bilateral cataracts   . Breast cancer ([place])   . GERD (gastroesophageal reflux disease)   . History of gout   . Hypercholesteremia   . Hypertension   . Malignant neoplasm of lower-inner quadrant of left female breast ([place])   . Osteoporosis     Past Surgical History:  Procedure Laterality Date  . ABDOMINAL HYSTERECTOMY    . BREAST SURGERY    . CATARACT EXTRACTION W/PHACO Left 09/04/2014   Procedure: CATARACT EXTRACTION PHACO AND INTRAOCULAR LENS PLACEMENT (IOC);  Surgeon: Tonny Branch, MD;  Location: [facility];  Service: Ophthalmology;  Laterality: Left;  CDE:9.84  . CATARACT EXTRACTION W/PHACO Right 11/09/2014   Procedure: CATARACT EXTRACTION PHACO AND INTRAOCULAR LENS PLACEMENT (IOC);  Surgeon: Tonny Branch, MD;  Location: [facility];  Service: Ophthalmology;  Laterality: Right;  CDE:8.40  . CORONARY ANGIOPLASTY     RCA stent  . LEFT HEART CATHETERIZATION WITH CORONARY ANGIOGRAM N/A 12/21/2012   Procedure: LEFT HEART CATHETERIZATION WITH CORONARY ANGIOGRAM;  Surgeon: Clent Demark, MD;  Location: [facility];  Service: Cardiovascular;  Laterality: N/A;  . MASTECTOMY MODIFIED RADICAL Left 01/16/2017   Procedure: SIMPLE MASTECTOMY;  Surgeon: Aviva Signs, MD;  Location: [facility];  Service:  General;  Laterality: Left;  Marland Kitchen MASTECTOMY, PARTIAL Left   . PERCUTANEOUS STENT INTERVENTION     RCA  . PORT-A-CATH REMOVAL    . PORTACATH PLACEMENT    . PORTACATH PLACEMENT Right 03/11/2017   Procedure: INSERTION PORT-A-CATH;  Surgeon: Aviva Signs, MD;  Location: [facility];  Service: General;  Laterality: Right;    Prior to Admission medications   Medication Sig Start Date End Date Taking? Authorizing Provider  Artificial Tear Solution (SOOTHE XP OP) Place 1-2 drops into both eyes as needed (dry eye).    Yes [provider]  aspirin EC 81 MG EC tablet Take 1 tablet (81 mg total) by mouth daily. 12/24/12  Yes Charolette Forward, MD  atorvastatin (LIPITOR) 40 MG tablet Take 40 mg by mouth daily.  11/26/16  Yes [provider]  CARTIA XT 180 MG 24 hr capsule Take 180 mg by mouth daily. 05/02/13  Yes [provider]  pantoprazole (PROTONIX) 40 MG tablet Take 40 mg by mouth daily as needed (acid reflux).    Yes [provider]  Vitamin D, Ergocalciferol, (DRISDOL) 50000 units CAPS capsule Take 50,000 Units by mouth every Wednesday.   Yes [provider]  Cyanocobalamin (B-12 COMPLIANCE INJECTION IJ) Inject as directed every 30 (thirty) days.    [provider]    Allergies as of 06/04/2017 - Review Complete 06/04/2017  Allergen Reaction Noted  . Azithromycin Hives 09/15/2010    Family History  Problem Relation Age of Onset  . Stroke Mother   . Colon cancer  Neg Hx   . Gastric cancer Neg Hx   . Esophageal cancer Neg Hx     Social History   Socioeconomic History  . Marital status: Widowed    Spouse name: Not on file  . Number of children: Not on file  . Years of education: Not on file  . Highest education level: Not on file  Occupational History  . Not on file  Social Needs  . Financial resource strain: Not on file  . Food insecurity:    Worry: Not on file    Inability: Not on file  . Transportation needs:    Medical: Not on  file    Non-medical: Not on file  Tobacco Use  . Smoking status: Current Every Day Smoker    Packs/day: 0.50    Years: 50.00    Pack years: 25.00    Types: Cigarettes  . Smokeless tobacco: Never Used  . Tobacco comment: 10 cigarettes daily  Substance and Sexual Activity  . Alcohol use: No  . Drug use: No  . Sexual activity: Not Currently    Birth control/protection: Surgical  Lifestyle  . Physical activity:    Days per week: Not on file    Minutes per session: Not on file  . Stress: Not on file  Relationships  . Social connections:    Talks on phone: Not on file    Gets together: Not on file    Attends religious service: Not on file    Active member of club or organization: Not on file    Attends meetings of clubs or organizations: Not on file    Relationship status: Not on file  . Intimate partner violence:    Fear of current or ex partner: Not on file    Emotionally abused: Not on file    Physically abused: Not on file    Forced sexual activity: Not on file  Other Topics Concern  . Not on file  Social History Narrative  . Not on file    Review of Systems: See HPI, otherwise negative ROS   Physical Exam: BP 139/67   Pulse 77   Temp 97.7 F (36.5 C) (Oral)   Resp 18   SpO2 95%  General:   Alert,  pleasant and cooperative in NAD Head:  Normocephalic and atraumatic. Neck:  Supple; Lungs:  Clear throughout to auscultation.    Heart:  Regular rate and rhythm. Abdomen:  Soft, nontender and nondistended. Normal bowel sounds, without guarding, and without rebound.   Neurologic:  Alert and  oriented x4;  grossly normal neurologically.  Impression/Plan:     DYSPHAGIA/ODYNOPHAGIA  PLAN:  EGD/DIL TODAY. DISCUSSED PROCEDURE, BENEFITS, & RISKS: < 1% chance of medication reaction, bleeding, OR perforation.

## 2017-08-05 ENCOUNTER — Telehealth: Payer: Self-pay | Admitting: Gastroenterology

## 2017-08-05 NOTE — Telephone Encounter (Signed)
Please call pt. HER ESOPHAGEAL BIOPSIES SHOWS ESOPHAGITIS AND THE stomach Bx shows mild gastritis DUE TO ASPIRIN USE.    HOLD ASPIRIN. RE-START JUL 27.  DRINK WATER TO KEEP YOUR URINE LIGHT YELLOW.  AVOID REFLUX TRIGGERS.   STRICTLY FOLLOW A LOW FAT DIET.   TO PREVENT REFLUX, TREAT GASTRITIS/DUIODENITIS, AND PREVENT ULCERS DUE TO ASPIRIN USE, CONTINUE PROTONIX. TAKE 30 MINUTES PRIOR TO BREAKFAST EVERY DAY.  FOLLOW UP IN 4 MOS E30 ODYNOPHAGIA/DYSPHAGIA.

## 2017-08-06 NOTE — Telephone Encounter (Signed)
PATIENT SCHEDULED  °

## 2017-08-06 NOTE — Telephone Encounter (Signed)
PT is aware.

## 2017-08-10 ENCOUNTER — Encounter (HOSPITAL_COMMUNITY): Payer: Self-pay | Admitting: Gastroenterology

## 2017-08-10 NOTE — Progress Notes (Signed)
CC'D TO PCP °

## 2017-08-17 ENCOUNTER — Encounter (HOSPITAL_COMMUNITY)
Admission: RE | Admit: 2017-08-17 | Discharge: 2017-08-17 | Disposition: A | Discharge status: Home / Self Care | Payer: Medicare Other | Source: Ambulatory Visit | Attending: Internal Medicine | Admitting: Internal Medicine

## 2017-08-17 DIAGNOSIS — C50919 Malignant neoplasm of unspecified site of unspecified female breast: Secondary | ICD-10-CM | POA: Diagnosis not present

## 2017-08-17 DIAGNOSIS — R918 Other nonspecific abnormal finding of lung field: Secondary | ICD-10-CM | POA: Diagnosis not present

## 2017-08-17 MED ORDER — FLUDEOXYGLUCOSE F - 18 (FDG) INJECTION
10.0000 | Freq: Once | INTRAVENOUS | Status: AC | PRN
  Administered 2017-08-17: 8.07 via INTRAVENOUS

## 2017-08-19 ENCOUNTER — Telehealth: Payer: Self-pay | Admitting: Gastroenterology

## 2017-08-19 ENCOUNTER — Ambulatory Visit (HOSPITAL_COMMUNITY): Payer: Medicare Other | Admitting: Hematology

## 2017-08-19 DIAGNOSIS — R1907 Generalized intra-abdominal and pelvic swelling, mass and lump: Secondary | ICD-10-CM

## 2017-08-19 NOTE — Telephone Encounter (Signed)
Pt said she had a procedure done and now her "belly is swelling". Patient is asking to speak with a nurse. Please call her at [phone]

## 2017-08-19 NOTE — Telephone Encounter (Signed)
Spoke with pt her procedure was 08/04/17 (EGD). Pt noticed her stomach start to swell s/p her procedure and last night swelling was very visible through clothing.  Pt eats little and has on been taking her Pantoprazole as needed only. Pt reports no pain in her stomach or nausea or vomiting.

## 2017-08-20 NOTE — Telephone Encounter (Signed)
PLEASE CALL PT. SHE LIKELY ATE A FOOD OR DRANK SOMETHING THAT CAUSED BLOATING OR GAS. SHE SHOULD COMPLETE A CT SCAN OF THE ABDOMEN AND PELVIS TO MAKE SURE SHE DOES NOT HAVE A BOWEL BLOCKAGE.

## 2017-08-20 NOTE — Telephone Encounter (Signed)
PT is aware, she feels some better, but OK to schedule the CT, she has been very worried.

## 2017-08-20 NOTE — Addendum Note (Signed)
Addended by: Danie Binder on: 08/20/2017 12:26 PM   Modules accepted: Orders

## 2017-08-20 NOTE — Telephone Encounter (Addendum)
CT scheduled for 09/03/17 at 9:00am, arrival time 8:45pm, npo 4 hrs prior. Patient needs to p/u oral contrast.  Called patient and is aware of appt details. She voiced understanding.  [place] website and no PA is required for CT

## 2017-08-24 ENCOUNTER — Inpatient Hospital Stay (HOSPITAL_COMMUNITY): Payer: Medicare Other

## 2017-08-24 ENCOUNTER — Encounter (HOSPITAL_COMMUNITY): Payer: Self-pay | Admitting: Hematology

## 2017-08-24 ENCOUNTER — Inpatient Hospital Stay (HOSPITAL_COMMUNITY): Payer: Medicare Other | Attending: Internal Medicine | Admitting: Hematology

## 2017-08-24 VITALS — BP 131/64 | HR 94 | Temp 98.2°F | Resp 16 | Wt 123.8 lb

## 2017-08-24 DIAGNOSIS — C50312 Malignant neoplasm of lower-inner quadrant of left female breast: Secondary | ICD-10-CM

## 2017-08-24 DIAGNOSIS — Z853 Personal history of malignant neoplasm of breast: Secondary | ICD-10-CM | POA: Diagnosis not present

## 2017-08-24 MED ORDER — HEPARIN SOD (PORK) LOCK FLUSH 100 UNIT/ML IV SOLN
500.0000 [IU] | Freq: Once | INTRAVENOUS | Status: AC
  Administered 2017-08-24: 500 [IU] via INTRAVENOUS

## 2017-08-24 MED ORDER — SODIUM CHLORIDE 0.9% FLUSH
10.0000 mL | INTRAVENOUS | Status: DC | PRN
  Administered 2017-08-24: 10 mL via INTRAVENOUS
  Filled 2017-08-24: qty 10

## 2017-08-24 NOTE — Progress Notes (Signed)
[name], [place] [id number]   CLINIC:  Medical Oncology/Hematology  PCP:  Redmond School, [name] [address] [phone]   REASON FOR VISIT:  Follow-up for breast cancer, ER-/PR-/HER2-  CURRENT THERAPY: observation  BRIEF ONCOLOGIC HISTORY:    Malignant neoplasm of lower-inner quadrant of left female breast ([place])   08/21/2016 Pathology Results    Left needle core biopsy, lower-inner quadrant: invasive ductal carcinoma and DCIS.  ER/PR-, HER2-.     01/16/2017 Surgery    Left simple mastectomy by Dr. Alean Rinne ductal carcinoma, grade 3, measuring 1.8 cm with DCIS (high-grade).  Lymphovascular invasion is identified.  Clear surgical margins.      CANCER STAGING: Cancer Staging History of left breast cancer (2003) Staging form: Breast, AJCC 7th Edition - Clinical: Stage IIA (T1c, N1, cM0) - Signed by Baird Cancer, PA-C on 11/23/2012  Malignant neoplasm of lower-inner quadrant of left female breast [facility]) Staging form: Breast, AJCC 8th Edition - Pathologic stage from 01/19/2017: Stage Unknown (pT1c, pNX, cM0, G3, ER: Negative, PR: Negative, HER2: Negative) - Signed by Baird Cancer, PA-C on 02/27/2017    INTERVAL HISTORY:  Margaret Thompson 84 y.o. female returns for routine follow-up for breast cancer. Patient is here today with her daughter. Her appetite and energy level are at 75%. She has fatigue through out the day. Patient has no other complaints at this time. Patient lives at home alone and performs all her own ADLs. She denies headaches. Denies any nausea, vomiting, or diarrhea. Denies any new lumps felt. Patient states she has not had her yearly mammogram this year and needs to be scheduled for this. She still has her port placed and will get it flushed every 2-3 months.     REVIEW OF SYSTEMS:  Review of Systems  Constitutional: Positive for fatigue.  Hematological: Bruises/bleeds easily.  All  other systems reviewed and are negative.    PAST MEDICAL/SURGICAL HISTORY:  Past Medical History:  Diagnosis Date  . Acute MI ([place]) 2003  . Adenocarcinoma of left breast ([place]) 11/23/2012   Stage II (T1 C. N1 M0) adenocarcinoma of the left breast with surgery on 03/08/2001. She had a 1.5 cm cancer with 1/6 positive sentinel nodes. Reexcision margins were clear. She did participate in NSABP B.-30 randomized to Adriamycin and Taxotere for 4 cycles followed by radiation therapy from 07/13/2001 to 08/27/2001. She then started Arimidex on 07/07/2001 took that until the end of June 2008. T  . Anemia   . Bilateral cataracts   . Breast cancer ([place])   . GERD (gastroesophageal reflux disease)   . History of gout   . Hypercholesteremia   . Hypertension   . Malignant neoplasm of lower-inner quadrant of left female breast ([place])   . Osteoporosis    Past Surgical History:  Procedure Laterality Date  . ABDOMINAL HYSTERECTOMY    . BREAST SURGERY    . CATARACT EXTRACTION W/PHACO Left 09/04/2014   Procedure: CATARACT EXTRACTION PHACO AND INTRAOCULAR LENS PLACEMENT (IOC);  Surgeon: Tonny Branch, MD;  Location: [facility];  Service: Ophthalmology;  Laterality: Left;  CDE:9.84  . CATARACT EXTRACTION W/PHACO Right 11/09/2014   Procedure: CATARACT EXTRACTION PHACO AND INTRAOCULAR LENS PLACEMENT (IOC);  Surgeon: Tonny Branch, MD;  Location: [facility];  Service: Ophthalmology;  Laterality: Right;  CDE:8.40  . CORONARY ANGIOPLASTY     RCA stent  . ESOPHAGOGASTRODUODENOSCOPY (EGD) WITH PROPOFOL N/A 08/04/2017   Procedure: ESOPHAGOGASTRODUODENOSCOPY (EGD) WITH PROPOFOL;  Surgeon:  Fields, Marga Melnick, MD;  Location: [facility];  Service: Endoscopy;  Laterality: N/A;  7:30am  . LEFT HEART CATHETERIZATION WITH CORONARY ANGIOGRAM N/A 12/21/2012   Procedure: LEFT HEART CATHETERIZATION WITH CORONARY ANGIOGRAM;  Surgeon: Clent Demark, MD;  Location: [facility] CATH LAB;  Service: Cardiovascular;  Laterality: N/A;  . MASTECTOMY MODIFIED  RADICAL Left 01/16/2017   Procedure: SIMPLE MASTECTOMY;  Surgeon: Aviva Signs, MD;  Location: [facility];  Service: General;  Laterality: Left;  Marland Kitchen MASTECTOMY, PARTIAL Left   . PERCUTANEOUS STENT INTERVENTION     RCA  . PORT-A-CATH REMOVAL    . PORTACATH PLACEMENT    . PORTACATH PLACEMENT Right 03/11/2017   Procedure: INSERTION PORT-A-CATH;  Surgeon: Aviva Signs, MD;  Location: [facility];  Service: General;  Laterality: Right;  . SAVORY DILATION N/A 08/04/2017   Procedure: SAVORY DILATION;  Surgeon: Danie Binder, MD;  Location: [facility];  Service: Endoscopy;  Laterality: N/A;     SOCIAL HISTORY:  Social History   Socioeconomic History  . Marital status: Widowed    Spouse name: Not on file  . Number of children: Not on file  . Years of education: Not on file  . Highest education level: Not on file  Occupational History  . Not on file  Social Needs  . Financial resource strain: Not on file  . Food insecurity:    Worry: Not on file    Inability: Not on file  . Transportation needs:    Medical: Not on file    Non-medical: Not on file  Tobacco Use  . Smoking status: Current Every Day Smoker    Packs/day: 0.50    Years: 50.00    Pack years: 25.00    Types: Cigarettes  . Smokeless tobacco: Never Used  . Tobacco comment: 10 cigarettes daily  Substance and Sexual Activity  . Alcohol use: No  . Drug use: No  . Sexual activity: Not Currently    Birth control/protection: Surgical  Lifestyle  . Physical activity:    Days per week: Not on file    Minutes per session: Not on file  . Stress: Not on file  Relationships  . Social connections:    Talks on phone: Not on file    Gets together: Not on file    Attends religious service: Not on file    Active member of club or organization: Not on file    Attends meetings of clubs or organizations: Not on file    Relationship status: Not on file  . Intimate partner violence:    Fear of current or ex partner: Not on file     Emotionally abused: Not on file    Physically abused: Not on file    Forced sexual activity: Not on file  Other Topics Concern  . Not on file  Social History Narrative  . Not on file    FAMILY HISTORY:  Family History  Problem Relation Age of Onset  . Stroke Mother   . Colon cancer Neg Hx   . Gastric cancer Neg Hx   . Esophageal cancer Neg Hx     CURRENT MEDICATIONS:  Outpatient Encounter Medications as of 08/24/2017  Medication Sig Note  . Artificial Tear Solution (SOOTHE XP OP) Place 1-2 drops into both eyes as needed (dry eye).    Marland Kitchen atorvastatin (LIPITOR) 40 MG tablet Take 40 mg by mouth daily.    Marland Kitchen CARTIA XT 180 MG 24 hr capsule Take 180 mg by mouth  daily.   . Cyanocobalamin (B-12 COMPLIANCE INJECTION IJ) Inject as directed every 30 (thirty) days. 07/24/2017: Hasnt had since Feb.  Plans to talk to dr about this  . pantoprazole (PROTONIX) 40 MG tablet Take 40 mg by mouth daily as needed (acid reflux).    . Vitamin D, Ergocalciferol, (DRISDOL) 50000 units CAPS capsule Take 50,000 Units by mouth every Wednesday.    Facility-Administered Encounter Medications as of 08/24/2017  Medication  . [COMPLETED] heparin lock flush 100 unit/mL  . sodium chloride flush (NS) 0.9 % injection 10 mL    ALLERGIES:  Allergies  Allergen Reactions  . Azithromycin Hives     PHYSICAL EXAM:  ECOG Performance status: 1  Vitals:   08/24/17 1522  BP: 131/64  Pulse: 94  Resp: 16  Temp: 98.2 F (36.8 C)  SpO2: 97%   Filed Weights   08/24/17 1522  Weight: 123 lb 12.8 oz (56.2 kg)    Physical Exam  Constitutional: She is oriented to person, place, and time.  Cardiovascular: Normal rate, regular rhythm and normal heart sounds.  Pulmonary/Chest: Effort normal and breath sounds normal.  Neurological: She is alert and oriented to person, place, and time.  Skin: Skin is warm and dry.     LABORATORY DATA:  I have reviewed the labs as listed.  CBC    Component Value Date/Time   WBC  7.7 07/29/2017 1358   RBC 4.26 07/29/2017 1358   HGB 13.0 07/29/2017 1358   HCT 40.1 07/29/2017 1358   PLT 270 07/29/2017 1358   MCV 94.1 07/29/2017 1358   MCH 30.5 07/29/2017 1358   MCHC 32.4 07/29/2017 1358   RDW 12.9 07/29/2017 1358   LYMPHSABS 3.1 07/29/2017 1358   MONOABS 0.7 07/29/2017 1358   EOSABS 0.2 07/29/2017 1358   BASOSABS 0.0 07/29/2017 1358   CMP Latest Ref Rng & Units 07/29/2017 03/30/2017 03/13/2017  Glucose 70 - 99 mg/dL 85 108(H) 192(H)  BUN 8 - 23 mg/dL _0 Creatinine 0.44 - 1.00 mg/dL 0.96 0.91 0.86  Sodium 135 - 145 mmol/L 139 140 138  Potassium 3.5 - 5.1 mmol/L 3.8 4.0 3.3(L)  Chloride 98 - 111 mmol/L 106 103 102  CO2 22 - 32 mmol/L _1 Calcium 8.9 - 10.3 mg/dL 9.3 9.3 9.3  Total Protein 6.5 - 8.1 g/dL - 7.6 7.3  Total Bilirubin 0.3 - 1.2 mg/dL - 0.6 0.5  Alkaline Phos 38 - 126 U/L - 95 70  AST 15 - 41 U/L - 17 20  ALT 14 - 54 U/L - 11(L) 10(L)       DIAGNOSTIC IMAGING:  I have independently reviewed PET CT scan images and discussed with the patient.     ASSESSMENT & PLAN:   Malignant neoplasm of lower-inner quadrant of left female breast ([place]) 1.  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 Arnoldo Morale on 01/16/2017, showing a 1.8 cm primary tumor, grade 3, with alvei, clear surgical margins, with high-grade DCIS.  No lymph nodes could be removed due to her past history of left breast cancer treatment. -Adjuvant chemotherapy with 4 cycles of TC was recommended. - PET/CT scan on 03/27/2017 showed mildly hypermetabolic small prevascular lymph node and right lower paratracheal lymph node are indeterminate.  Moderate metabolic activity associated with a ill-defined left  upper lobe nodule. -Patient recently had a PET CT scan on  08/17/2017 showing mild reduction in size and activity of the ill-defined left upper lobe pulmonary nodules, favoring benign etiology.  Stable small prevascular lymph nodes and reduced size and activity of the lower paratracheal lymph node.  Overall appearance suggests reactive/inflammatory etiology. - I would not recommend any adjuvant chemotherapy at this time, is more than 7 months have elapsed from her surgery.  She has a port placed, which I have recommended to keep it for 1 to 2 years. -I will see her back in 4 months with repeat physical exam and labs. -We will also schedule her right breast mammogram which is overdue since July ending.  2.  History of node positive left breast cancer: - Diagnosed on 02/16/2001, T1c N1 M0, stage IIa, ER/PR positive, HER-2 negative, 1.5 cm, 1 out of 6 lymph nodes positive.      Orders placed this encounter:  Orders Placed This Encounter  Procedures  . MM Digital Screening Unilat R  . CBC with Differential/Platelet  . Comprehensive metabolic panel      Derek Jack, MD [place] [phone]

## 2017-08-24 NOTE — Assessment & Plan Note (Addendum)
1.  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 Arnoldo Morale on 01/16/2017, showing a 1.8 cm primary tumor, grade 3, with alvei, clear surgical margins, with high-grade DCIS.  No lymph nodes could be removed due to her past history of left breast cancer treatment. -Adjuvant chemotherapy with 4 cycles of TC was recommended. - PET/CT scan on 03/27/2017 showed mildly hypermetabolic small prevascular lymph node and right lower paratracheal lymph node are indeterminate.  Moderate metabolic activity associated with a ill-defined left upper lobe nodule. -Patient recently had a PET CT scan on 08/17/2017 showing mild reduction in size and activity of the ill-defined left upper lobe pulmonary nodules, favoring benign etiology.  Stable small prevascular lymph nodes and reduced size and activity of the lower paratracheal lymph node.  Overall appearance suggests reactive/inflammatory etiology. - I would not recommend any adjuvant chemotherapy at this time, is more than 7 months have elapsed from her surgery.  She has a port placed, which I have recommended to keep it for 1 to 2 years. -I will see her back in 4 months with repeat physical exam and labs. -We will also schedule her right breast mammogram which is overdue since July ending.  2.  History of node positive left breast cancer: - Diagnosed on 02/16/2001, T1c N1 M0, stage IIa, ER/PR positive, HER-2 negative, 1.5 cm, 1 out of 6 lymph nodes positive.

## 2017-08-24 NOTE — Patient Instructions (Signed)
[facility] at [facility] Discharge Instructions  You saw Dr. Katragadda today.   Thank you for choosing [facility] at [facility] to provide your oncology and hematology care.  To afford each patient quality time with our provider, please arrive at least 15 minutes before your scheduled appointment time.   If you have a lab appointment with the Cancer Center please come in thru the  Main Entrance and check in at the main information desk  You need to re-schedule your appointment should you arrive 10 or more minutes late.  We strive to give you quality time with our providers, and arriving late affects you and other patients whose appointments are after yours.  Also, if you no show three or more times for appointments you may be dismissed from the clinic at the providers discretion.     Again, thank you for choosing [facility].  Our hope is that these requests will decrease the amount of time that you wait before being seen by our physicians.       _____________________________________________________________  Should you have questions after your visit to  Cancer Center, please contact our office at [phone] between the hours of 8:00 a.m. and 4:30 p.m.  Voicemails left after 4:00 p.m. will not be returned until the following business day.  For prescription refill requests, have your pharmacy contact our office and allow 72 hours.    Cancer Center Support Programs:   > Cancer Support Group  2nd Tuesday of the month 1pm-2pm, Journey Room    

## 2017-08-24 NOTE — Progress Notes (Signed)
Miguel Aschoff tolerated portacath flush well without complaints or incident. Port accessed with 20 gauge needle without blood return noted then flushed easily with 10 ml NS and 5 ml Heparin per protocol. Pt denied any discomfort and no swelling noted around port site. Pt discharged self ambulatory in satisfactory condition accompanied by family member

## 2017-08-24 NOTE — Patient Instructions (Signed)
[facility] at [facility] Discharge Instructions  Portacath flushed per protocol today. Follow-up as scheduled. Call clinic for any questions or concerns   Thank you for choosing [facility] at [facility] to provide your oncology and hematology care.  To afford each patient quality time with our provider, please arrive at least 15 minutes before your scheduled appointment time.   If you have a lab appointment with the [facility] please come in thru the  Main Entrance and check in at the main information desk  You need to re-schedule your appointment should you arrive 10 or more minutes late.  We strive to give you quality time with our providers, and arriving late affects you and other patients whose appointments are after yours.  Also, if you no show three or more times for appointments you may be dismissed from the clinic at the providers discretion.     Again, thank you for choosing [facility].  Our hope is that these requests will decrease the amount of time that you wait before being seen by our physicians.       _____________________________________________________________  Should you have questions after your visit to [facility], please contact our office at [phone] between the hours of 8:00 a.m. and 4:30 p.m.  Voicemails left after 4:00 p.m. will not be returned until the following business day.  For prescription refill requests, have your pharmacy contact our office and allow 72 hours.    Cancer Center Support Programs:   > Cancer Support Group  2nd Tuesday of the month 1pm-2pm, Journey Room

## 2017-08-26 ENCOUNTER — Ambulatory Visit (HOSPITAL_COMMUNITY)
Admission: RE | Admit: 2017-08-26 | Discharge: 2017-08-26 | Disposition: A | Discharge status: Home / Self Care | Payer: Medicare Other | Source: Ambulatory Visit | Attending: Nurse Practitioner | Admitting: Nurse Practitioner

## 2017-08-26 ENCOUNTER — Other Ambulatory Visit (HOSPITAL_COMMUNITY): Payer: Self-pay | Admitting: Nurse Practitioner

## 2017-08-26 DIAGNOSIS — Z1231 Encounter for screening mammogram for malignant neoplasm of breast: Secondary | ICD-10-CM | POA: Diagnosis not present

## 2017-08-26 DIAGNOSIS — C50312 Malignant neoplasm of lower-inner quadrant of left female breast: Secondary | ICD-10-CM

## 2017-08-26 DIAGNOSIS — Z853 Personal history of malignant neoplasm of breast: Secondary | ICD-10-CM

## 2017-09-03 ENCOUNTER — Ambulatory Visit (HOSPITAL_COMMUNITY): Payer: Medicare Other

## 2017-09-03 ENCOUNTER — Ambulatory Visit: Payer: Medicare Other | Admitting: Nurse Practitioner

## 2017-09-07 DIAGNOSIS — E538 Deficiency of other specified B group vitamins: Secondary | ICD-10-CM | POA: Diagnosis not present

## 2017-09-07 DIAGNOSIS — R109 Unspecified abdominal pain: Secondary | ICD-10-CM | POA: Diagnosis not present

## 2017-09-07 DIAGNOSIS — Z1389 Encounter for screening for other disorder: Secondary | ICD-10-CM | POA: Diagnosis not present

## 2017-09-07 DIAGNOSIS — Z6820 Body mass index (BMI) 20.0-20.9, adult: Secondary | ICD-10-CM | POA: Diagnosis not present

## 2017-09-07 DIAGNOSIS — M546 Pain in thoracic spine: Secondary | ICD-10-CM | POA: Diagnosis not present

## 2017-09-08 ENCOUNTER — Emergency Department (HOSPITAL_COMMUNITY)
Admission: EM | Admit: 2017-09-08 | Discharge: 2017-09-08 | Disposition: A | Discharge status: Home / Self Care | Payer: Medicare Other | Attending: Emergency Medicine | Admitting: Emergency Medicine

## 2017-09-08 ENCOUNTER — Emergency Department (HOSPITAL_COMMUNITY): Payer: Medicare Other

## 2017-09-08 ENCOUNTER — Encounter (HOSPITAL_COMMUNITY): Payer: Self-pay | Admitting: *Deleted

## 2017-09-08 DIAGNOSIS — F17210 Nicotine dependence, cigarettes, uncomplicated: Secondary | ICD-10-CM | POA: Diagnosis not present

## 2017-09-08 DIAGNOSIS — R05 Cough: Secondary | ICD-10-CM | POA: Diagnosis not present

## 2017-09-08 DIAGNOSIS — Z853 Personal history of malignant neoplasm of breast: Secondary | ICD-10-CM | POA: Diagnosis not present

## 2017-09-08 DIAGNOSIS — I10 Essential (primary) hypertension: Secondary | ICD-10-CM | POA: Diagnosis not present

## 2017-09-08 DIAGNOSIS — Z79899 Other long term (current) drug therapy: Secondary | ICD-10-CM | POA: Diagnosis not present

## 2017-09-08 DIAGNOSIS — J189 Pneumonia, unspecified organism: Secondary | ICD-10-CM | POA: Diagnosis not present

## 2017-09-08 DIAGNOSIS — M549 Dorsalgia, unspecified: Secondary | ICD-10-CM | POA: Diagnosis present

## 2017-09-08 DIAGNOSIS — I252 Old myocardial infarction: Secondary | ICD-10-CM | POA: Diagnosis not present

## 2017-09-08 DIAGNOSIS — Z955 Presence of coronary angioplasty implant and graft: Secondary | ICD-10-CM | POA: Insufficient documentation

## 2017-09-08 DIAGNOSIS — Z7982 Long term (current) use of aspirin: Secondary | ICD-10-CM | POA: Diagnosis not present

## 2017-09-08 DIAGNOSIS — R079 Chest pain, unspecified: Secondary | ICD-10-CM | POA: Diagnosis not present

## 2017-09-08 LAB — COMPREHENSIVE METABOLIC PANEL
ALBUMIN: 4.4 g/dL (ref 3.5–5.0)
ALK PHOS: 82 U/L (ref 38–126)
ALT: 12 U/L (ref 0–44)
ANION GAP: 8 (ref 5–15)
AST: 17 U/L (ref 15–41)
BILIRUBIN TOTAL: 0.6 mg/dL (ref 0.3–1.2)
BUN: 7 mg/dL — AB (ref 8–23)
CO2: 25 mmol/L (ref 22–32)
Calcium: 9.5 mg/dL (ref 8.9–10.3)
Chloride: 108 mmol/L (ref 98–111)
Creatinine, Ser: 0.84 mg/dL (ref 0.44–1.00)
GFR calc Af Amer: >60 mL/min (ref >60)
GLUCOSE: 103 mg/dL — AB (ref 70–99)
POTASSIUM: 3.6 mmol/L (ref 3.5–5.1)
Sodium: 141 mmol/L (ref 135–145)
TOTAL PROTEIN: 8.2 g/dL — AB (ref 6.5–8.1)

## 2017-09-08 LAB — CBC WITH DIFFERENTIAL/PLATELET
BASOS PCT: 0 %
Basophils Absolute: 0 10*3/uL (ref 0.0–0.1)
Eosinophils Absolute: 0.3 10*3/uL (ref 0.0–0.7)
Eosinophils Relative: 3 %
HEMATOCRIT: 44.2 % (ref 36.0–46.0)
HEMOGLOBIN: 14.4 g/dL (ref 12.0–15.0)
Lymphocytes Relative: 43 %
Lymphs Abs: 4.2 10*3/uL — ABNORMAL HIGH (ref 0.7–4.0)
MCH: 30.8 pg (ref 26.0–34.0)
MCHC: 32.6 g/dL (ref 30.0–36.0)
MCV: 94.4 fL (ref 78.0–100.0)
MONO ABS: 0.6 10*3/uL (ref 0.1–1.0)
MONOS PCT: 7 %
NEUTROS ABS: 4.5 10*3/uL (ref 1.7–7.7)
NEUTROS PCT: 47 %
Platelets: 267 10*3/uL (ref 150–400)
RBC: 4.68 MIL/uL (ref 3.87–5.11)
RDW: 13.2 % (ref 11.5–15.5)
WBC: 9.7 10*3/uL (ref 4.0–10.5)

## 2017-09-08 MED ORDER — LEVOFLOXACIN 500 MG PO TABS: 500.0000 mg | ORAL_TABLET | Freq: Every day | ORAL | 0 refills | Status: DC

## 2017-09-08 NOTE — Discharge Instructions (Addendum)
Follow-up with your family doctor next week for recheck take Tylenol for pain

## 2017-09-08 NOTE — ED Provider Notes (Signed)
[facility] EMERGENCY DEPARTMENT Provider Note   CSN: [id number] Arrival date & time: 09/08/17  1527     History   Chief Complaint Chief Complaint  Patient presents with  . Back Pain    HPI Margaret [place] is a 84 y.o. female.  Patient complains of upper back pain.  No fevers no chills  The history is provided by the patient. No language interpreter was used.  Back Pain   This is a new problem. The current episode started 2 days ago. The problem occurs constantly. The problem has not changed since onset.The pain is associated with no known injury. Pain location: Upper back. The quality of the pain is described as aching. The pain does not radiate. The pain is at a severity of 4/10. The pain is moderate. Exacerbated by: Unknown. The pain is the same all the time. Pertinent negatives include no chest pain, no headaches and no abdominal pain.    Past Medical History:  Diagnosis Date  . Acute MI ([place]) 2003  . Adenocarcinoma of left breast ([place]) 11/23/2012   Stage II (T1 C. N1 M0) adenocarcinoma of the left breast with surgery on 03/08/2001. She had a 1.5 cm cancer with 1/6 positive sentinel nodes. Reexcision margins were clear. She did participate in NSABP B.-30 randomized to Adriamycin and Taxotere for 4 cycles followed by radiation therapy from 07/13/2001 to 08/27/2001. She then started Arimidex on 07/07/2001 took that until the end of June 2008. T  . Anemia   . Bilateral cataracts   . Breast cancer ([place])   . GERD (gastroesophageal reflux disease)   . History of gout   . Hypercholesteremia   . Hypertension   . Malignant neoplasm of lower-inner quadrant of left female breast ([place])   . Osteoporosis     Patient Active Problem List   Diagnosis Date Noted  . Odynophagia 06/04/2017  . GERD (gastroesophageal reflux disease) 06/04/2017  . Malignant neoplasm of lower-inner quadrant of left female breast ([place])   . 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 History of left  breast cancer (2003) 11/23/2012  . Bursitis, shoulder 05/25/2012    Past Surgical History:  Procedure Laterality Date  . ABDOMINAL HYSTERECTOMY    . BREAST SURGERY    . CATARACT EXTRACTION W/PHACO Left 09/04/2014   Procedure: CATARACT EXTRACTION PHACO AND INTRAOCULAR LENS PLACEMENT (IOC);  Surgeon: Tonny Branch, MD;  Location: [facility] ORS;  Service: Ophthalmology;  Laterality: Left;  CDE:9.84  . CATARACT EXTRACTION W/PHACO Right 11/09/2014   Procedure: CATARACT EXTRACTION PHACO AND INTRAOCULAR LENS PLACEMENT (IOC);  Surgeon: Tonny Branch, MD;  Location: [facility] ORS;  Service: Ophthalmology;  Laterality: Right;  CDE:8.40  . CORONARY ANGIOPLASTY     RCA stent  . ESOPHAGOGASTRODUODENOSCOPY (EGD) WITH PROPOFOL N/A 08/04/2017   Procedure: ESOPHAGOGASTRODUODENOSCOPY (EGD) WITH PROPOFOL;  Surgeon: Danie Binder, MD;  Location: [facility] ENDO SUITE;  Service: Endoscopy;  Laterality: N/A;  7:30am  . LEFT HEART CATHETERIZATION WITH CORONARY ANGIOGRAM N/A 12/21/2012   Procedure: LEFT HEART CATHETERIZATION WITH CORONARY ANGIOGRAM;  Surgeon: Clent Demark, MD;  Location: [facility] CATH LAB;  Service: Cardiovascular;  Laterality: N/A;  . MASTECTOMY MODIFIED RADICAL Left 01/16/2017   Procedure: SIMPLE MASTECTOMY;  Surgeon: Aviva Signs, MD;  Location: [facility] ORS;  Service: General;  Laterality: Left;  Marland Kitchen MASTECTOMY, PARTIAL Left   . PERCUTANEOUS STENT INTERVENTION     RCA  . PORT-A-CATH REMOVAL    . PORTACATH PLACEMENT    . PORTACATH PLACEMENT Right 03/11/2017  Procedure: INSERTION PORT-A-CATH;  Surgeon: Aviva Signs, MD;  Location: [facility] ORS;  Service: General;  Laterality: Right;  . SAVORY DILATION N/A 08/04/2017   Procedure: SAVORY DILATION;  Surgeon: Danie Binder, MD;  Location: [facility] ENDO SUITE;  Service: Endoscopy;  Laterality: N/A;     OB History   None      Home Medications    Prior to Admission medications   Medication Sig Start Date End Date Taking? Authorizing Provider  Artificial Tear Solution (SOOTHE XP OP) Place  1-2 drops into both eyes daily as needed (dry eye).    Yes [provider]  aspirin EC 81 MG tablet Take 81 mg by mouth daily.   Yes [provider]  atorvastatin (LIPITOR) 40 MG tablet Take 40 mg by mouth every evening.  11/26/16  Yes [provider]  CARTIA XT 180 MG 24 hr capsule Take 180 mg by mouth daily. 05/02/13  Yes [provider]  Cyanocobalamin (B-12 COMPLIANCE INJECTION) 1000 MCG/ML KIT Inject as directed every 30 (thirty) days.    Yes [provider]  ranitidine (ZANTAC) 150 MG tablet Take 150 mg by mouth daily as needed for heartburn.   Yes [provider]  levofloxacin (LEVAQUIN) 500 MG tablet Take 1 tablet (500 mg total) by mouth daily. 09/08/17   Milton Ferguson, MD    Family History Family History  Problem Relation Age of Onset  . Stroke Mother   . Colon cancer Neg Hx   . Gastric cancer Neg Hx   . Esophageal cancer Neg Hx     Social History Social History   Tobacco Use  . Smoking status: Current Every Day Smoker    Packs/day: 0.50    Years: 50.00    Pack years: 25.00    Types: Cigarettes  . Smokeless tobacco: Never Used  . Tobacco comment: 10 cigarettes daily  Substance Use Topics  . Alcohol use: No  . Drug use: No     Allergies   Azithromycin   Review of Systems Review of Systems  Constitutional: Negative for appetite change and fatigue.  HENT: Negative for congestion, ear discharge and sinus pressure.   Eyes: Negative for discharge.  Respiratory: Negative for cough.   Cardiovascular: Negative for chest pain.  Gastrointestinal: Negative for abdominal pain and diarrhea.  Genitourinary: Negative for frequency and hematuria.  Musculoskeletal: Positive for back pain.  Skin: Negative for rash.  Neurological: Negative for seizures and headaches.  Psychiatric/Behavioral: Negative for hallucinations.     Physical Exam Updated Vital Signs BP (!) 157/81 (BP Location: Right Arm)   Pulse 99   Temp  98.1 F (36.7 C) (Oral)   Resp 18   SpO2 98%   Physical Exam  Constitutional: She is oriented to person, place, and time. She appears well-developed.  HENT:  Head: Normocephalic.  Eyes: Conjunctivae and EOM are normal. No scleral icterus.  Neck: Neck supple. No thyromegaly present.  Cardiovascular: Normal rate and regular rhythm. Exam reveals no gallop and no friction rub.  No murmur heard. Pulmonary/Chest: No stridor. She has no wheezes. She has no rales. She exhibits no tenderness.  Abdominal: She exhibits no distension. There is no tenderness. There is no rebound.  Musculoskeletal: Normal range of motion. She exhibits no edema.  Lymphadenopathy:    She has no cervical adenopathy.  Neurological: She is oriented to person, place, and time. She exhibits normal muscle tone. Coordination normal.  Skin: No rash noted. No erythema.  Psychiatric: She has a normal mood  and affect. Her behavior is normal.     ED Treatments / Results  Labs (all labs ordered are listed, but only abnormal results are displayed) Labs Reviewed  CBC WITH DIFFERENTIAL/PLATELET - Abnormal; Notable for the following components:      Result Value   Lymphs Abs 4.2 (*)    All other components within normal limits  COMPREHENSIVE METABOLIC PANEL    EKG None  Radiology Dg Chest 2 View  Result Date: 09/08/2017 CLINICAL DATA:  Chest pain, chest tightness and cough. EXAM: CHEST - 2 VIEW COMPARISON:  03/11/2017 FINDINGS: Heart size is normal. There is aortic atherosclerosis. Power port inserted from the right has its tip in the SVC at the azygos level. There is lingular pneumonia. The remainder the chest is clear. No effusions. Ordinary degenerative changes affect the spine. IMPRESSION: Lingular pneumonia Electronically Signed   By: Nelson Chimes M.D.   On: 09/08/2017 16:07    Procedures Procedures (including critical care time)  Medications Ordered in ED Medications - No data to display   Initial Impression  / Assessment and Plan / ED Course  I have reviewed the triage vital signs and the nursing notes.  Pertinent labs & imaging results that were available during my care of the patient were reviewed by me and considered in my medical decision making (see chart for details).     X-ray shows lingular pneumonia.  Patient refused to wait for the blood test to come back.  We will send her home with Levaquin and have her follow-up with her PCP  Final Clinical Impressions(s) / ED Diagnoses   Final diagnoses:  Community acquired pneumonia, unspecified laterality    ED Discharge Orders         Ordered    levofloxacin (LEVAQUIN) 500 MG tablet  Daily     09/08/17 1916           Milton Ferguson, MD 09/08/17 1919

## 2017-09-08 NOTE — ED Triage Notes (Signed)
Pt with PAC on right side as well due to hx of left sided breast cancer.  Last flushed about 3-4 weeks ago per pt.

## 2017-09-08 NOTE — ED Triage Notes (Signed)
Pt with right mid back pain with certain movements. Started two days ago, denies any injury.

## 2017-09-15 DIAGNOSIS — I2510 Atherosclerotic heart disease of native coronary artery without angina pectoris: Secondary | ICD-10-CM | POA: Diagnosis not present

## 2017-09-15 DIAGNOSIS — I252 Old myocardial infarction: Secondary | ICD-10-CM | POA: Diagnosis not present

## 2017-09-15 DIAGNOSIS — E785 Hyperlipidemia, unspecified: Secondary | ICD-10-CM | POA: Diagnosis not present

## 2017-09-15 DIAGNOSIS — R0609 Other forms of dyspnea: Secondary | ICD-10-CM | POA: Diagnosis not present

## 2017-09-15 DIAGNOSIS — I10 Essential (primary) hypertension: Secondary | ICD-10-CM | POA: Diagnosis not present

## 2017-09-29 DIAGNOSIS — Z23 Encounter for immunization: Secondary | ICD-10-CM | POA: Diagnosis not present

## 2017-09-29 DIAGNOSIS — Z1389 Encounter for screening for other disorder: Secondary | ICD-10-CM | POA: Diagnosis not present

## 2017-09-29 DIAGNOSIS — Z0001 Encounter for general adult medical examination with abnormal findings: Secondary | ICD-10-CM | POA: Diagnosis not present

## 2017-09-29 DIAGNOSIS — L918 Other hypertrophic disorders of the skin: Secondary | ICD-10-CM | POA: Diagnosis not present

## 2017-09-29 DIAGNOSIS — Z6823 Body mass index (BMI) 23.0-23.9, adult: Secondary | ICD-10-CM | POA: Diagnosis not present

## 2017-10-07 DIAGNOSIS — L089 Local infection of the skin and subcutaneous tissue, unspecified: Secondary | ICD-10-CM | POA: Diagnosis not present

## 2017-10-07 DIAGNOSIS — Z6820 Body mass index (BMI) 20.0-20.9, adult: Secondary | ICD-10-CM | POA: Diagnosis not present

## 2017-10-07 DIAGNOSIS — E538 Deficiency of other specified B group vitamins: Secondary | ICD-10-CM | POA: Diagnosis not present

## 2017-10-07 DIAGNOSIS — R109 Unspecified abdominal pain: Secondary | ICD-10-CM | POA: Diagnosis not present

## 2017-10-14 DIAGNOSIS — I252 Old myocardial infarction: Secondary | ICD-10-CM | POA: Diagnosis not present

## 2017-10-14 DIAGNOSIS — I2510 Atherosclerotic heart disease of native coronary artery without angina pectoris: Secondary | ICD-10-CM | POA: Diagnosis not present

## 2017-10-14 DIAGNOSIS — I10 Essential (primary) hypertension: Secondary | ICD-10-CM | POA: Diagnosis not present

## 2017-10-15 ENCOUNTER — Other Ambulatory Visit (HOSPITAL_COMMUNITY): Payer: Self-pay | Admitting: Family Medicine

## 2017-10-15 DIAGNOSIS — E2839 Other primary ovarian failure: Secondary | ICD-10-CM

## 2017-10-27 DIAGNOSIS — J329 Chronic sinusitis, unspecified: Secondary | ICD-10-CM | POA: Diagnosis not present

## 2017-10-27 DIAGNOSIS — R42 Dizziness and giddiness: Secondary | ICD-10-CM | POA: Diagnosis not present

## 2017-10-27 DIAGNOSIS — R5383 Other fatigue: Secondary | ICD-10-CM | POA: Diagnosis not present

## 2017-10-27 DIAGNOSIS — Z6820 Body mass index (BMI) 20.0-20.9, adult: Secondary | ICD-10-CM | POA: Diagnosis not present

## 2017-10-27 DIAGNOSIS — K219 Gastro-esophageal reflux disease without esophagitis: Secondary | ICD-10-CM | POA: Diagnosis not present

## 2017-10-27 DIAGNOSIS — Z1389 Encounter for screening for other disorder: Secondary | ICD-10-CM | POA: Diagnosis not present

## 2017-11-09 ENCOUNTER — Ambulatory Visit (HOSPITAL_COMMUNITY)
Admission: RE | Admit: 2017-11-09 | Discharge: 2017-11-09 | Disposition: A | Discharge status: Home / Self Care | Payer: Medicare Other | Source: Ambulatory Visit | Attending: Family Medicine | Admitting: Family Medicine

## 2017-11-09 DIAGNOSIS — E2839 Other primary ovarian failure: Secondary | ICD-10-CM | POA: Insufficient documentation

## 2017-11-09 DIAGNOSIS — M810 Age-related osteoporosis without current pathological fracture: Secondary | ICD-10-CM | POA: Insufficient documentation

## 2017-11-09 DIAGNOSIS — Z13820 Encounter for screening for osteoporosis: Secondary | ICD-10-CM | POA: Insufficient documentation

## 2017-11-09 DIAGNOSIS — Z780 Asymptomatic menopausal state: Secondary | ICD-10-CM | POA: Diagnosis not present

## 2017-11-24 ENCOUNTER — Encounter: Payer: Self-pay | Admitting: Nurse Practitioner

## 2017-11-24 ENCOUNTER — Ambulatory Visit (INDEPENDENT_AMBULATORY_CARE_PROVIDER_SITE_OTHER): Payer: Medicare Other | Admitting: Nurse Practitioner

## 2017-11-24 VITALS — BP 136/86 | HR 104 | Temp 97.5°F | Ht 62.0 in | Wt 121.8 lb

## 2017-11-24 DIAGNOSIS — G8929 Other chronic pain: Secondary | ICD-10-CM | POA: Insufficient documentation

## 2017-11-24 DIAGNOSIS — R1013 Epigastric pain: Secondary | ICD-10-CM | POA: Insufficient documentation

## 2017-11-24 DIAGNOSIS — K219 Gastro-esophageal reflux disease without esophagitis: Secondary | ICD-10-CM | POA: Diagnosis not present

## 2017-11-24 MED ORDER — PANTOPRAZOLE SODIUM 40 MG PO TBEC: 40.0000 mg | DELAYED_RELEASE_TABLET | Freq: Every day | ORAL | 3 refills | Status: DC

## 2017-11-24 NOTE — Progress Notes (Signed)
Referring Provider: Redmond School, MD Primary Care Physician:  Redmond School, MD Primary GI:  Dr. Oneida Alar  Chief Complaint  Patient presents with  . Gastroesophageal Reflux    Throat burns and side of tongue    HPI:   Margaret [place] is a 84 y.o. female who presents for follow-up on GERD.  The patient was last seen in our office 06/04/2017 for odynophagia, GERD.  History of adenocarcinoma of the left breast and is currently under the care of oncology.  Cancer is stage II with 1/6 sentinel nodes.  CT scan found hypermetabolic small prevascular lymph node and right lower paratracheal area deemed indeterminant.  CVTS declined to biopsy due to location and recommended bronchoscopy and endobronchial ultrasound under general anesthesia.  Also hypermetabolic area in the distal esophagus favored to be esophagitis.  Barium pill esophagram completed and found nonobstructing Schatzki's ring just above a small hiatal hernia as well as age-related dysmotility.    At her last visit she noted a history of reflux and has throat burning and intermittent a done aphasia about every couple days.  Occasional vomiting that she attributes to GERD based on dietary intake although cannot name specific triggers.  She tends to avoid her triggers.  No other GI symptoms.  Not currently on chemotherapy or radiation.  Recommended EGD with possible dilation, take Protonix once a day on an empty stomach, follow-up in 3 months.  EGD completed 08/04/2017 which found a moderate Schatzki's ring status post dilation, mild esophagitis, medium sized hiatal hernia, moderate gastritis/mild duodenitis due to aspirin use with as needed PPI.  Recommended Protonix daily indefinitely, follow-up in 4 months.  Surgical pathology found GMS stain negative for fungal organisms.  Today she states she's doing about the same. Still with throat and tongue burning with GERD. Still on daily ASA. She is not on a PPI. Previously taking Zantac half tab,  PCP increased it to once daily. Needs to have a tooth pulled. Has some nausea and vomiting (typically 1-2 episodes). Denies overt abdominal pain, hematochezia, melena, fever, chills, unintentional weight loss.  Showed the patient her AVS for her EGD and last OV. States she doesn't know anything about Protonix. States the AVS she has doesn't say anything about Protonix.  Past Medical History:  Diagnosis Date  . Acute MI ([place]) 2003  . Adenocarcinoma of left breast ([place]) 11/23/2012   Stage II (T1 C. N1 M0) adenocarcinoma of the left breast with surgery on 03/08/2001. She had a 1.5 cm cancer with 1/6 positive sentinel nodes. Reexcision margins were clear. She did participate in NSABP B.-30 randomized to Adriamycin and Taxotere for 4 cycles followed by radiation therapy from 07/13/2001 to 08/27/2001. She then started Arimidex on 07/07/2001 took that until the end of June 2008. T  . Anemia   . Bilateral cataracts   . Breast cancer ([place])   . GERD (gastroesophageal reflux disease)   . History of gout   . Hypercholesteremia   . Hypertension   . Malignant neoplasm of lower-inner quadrant of left female breast ([place])   . Osteoporosis     Past Surgical History:  Procedure Laterality Date  . ABDOMINAL HYSTERECTOMY    . BREAST SURGERY    . CATARACT EXTRACTION W/PHACO Left 09/04/2014   Procedure: CATARACT EXTRACTION PHACO AND INTRAOCULAR LENS PLACEMENT (IOC);  Surgeon: Tonny Branch, MD;  Location: [facility] ORS;  Service: Ophthalmology;  Laterality: Left;  CDE:9.84  . CATARACT EXTRACTION W/PHACO Right 11/09/2014   Procedure: CATARACT EXTRACTION PHACO AND  INTRAOCULAR LENS PLACEMENT (IOC);  Surgeon: Tonny Branch, MD;  Location: [facility] ORS;  Service: Ophthalmology;  Laterality: Right;  CDE:8.40  . CORONARY ANGIOPLASTY     RCA stent  . ESOPHAGOGASTRODUODENOSCOPY (EGD) WITH PROPOFOL N/A 08/04/2017   Procedure: ESOPHAGOGASTRODUODENOSCOPY (EGD) WITH PROPOFOL;  Surgeon: Danie Binder, MD;  Location: [facility] ENDO SUITE;  Service:  Endoscopy;  Laterality: N/A;  7:30am  . LEFT HEART CATHETERIZATION WITH CORONARY ANGIOGRAM N/A 12/21/2012   Procedure: LEFT HEART CATHETERIZATION WITH CORONARY ANGIOGRAM;  Surgeon: Clent Demark, MD;  Location: [facility] CATH LAB;  Service: Cardiovascular;  Laterality: N/A;  . MASTECTOMY MODIFIED RADICAL Left 01/16/2017   Procedure: SIMPLE MASTECTOMY;  Surgeon: Aviva Signs, MD;  Location: [facility] ORS;  Service: General;  Laterality: Left;  Marland Kitchen MASTECTOMY, PARTIAL Left   . PERCUTANEOUS STENT INTERVENTION     RCA  . PORT-A-CATH REMOVAL    . PORTACATH PLACEMENT    . PORTACATH PLACEMENT Right 03/11/2017   Procedure: INSERTION PORT-A-CATH;  Surgeon: Aviva Signs, MD;  Location: [facility] ORS;  Service: General;  Laterality: Right;  . SAVORY DILATION N/A 08/04/2017   Procedure: SAVORY DILATION;  Surgeon: Danie Binder, MD;  Location: [facility] SU[facility];  Service: Endoscopy;  Laterality: N/A;    Current Outpatient Medications  Medication Sig Dispense Refill  . Artificial Tear Solution (SOOTHE XP OP) Place 1-2 drops into both eyes daily as needed (dry eye).     Marland Kitchen aspirin EC 81 MG tablet Take 81 mg by mouth daily.    Marland Kitchen atorvastatin (LIPITOR) 40 MG tablet Take 40 mg by mouth every evening.     Marland Kitchen CARTIA XT 180 MG 24 hr capsule Take 180 mg by mouth daily.    . Cyanocobalamin (B-12 COMPLIANCE INJECTION) 1000 MCG/ML KIT Inject as directed every 30 (thirty) days.     . ranitidine (ZANTAC) 150 MG tablet Take 150 mg by mouth daily as needed for heartburn.     No current facility-administered medications for this visit.     Allergies as of 11/24/2017 - Review Complete 11/24/2017  Allergen Reaction Noted  . Azithromycin Hives 09/15/2010    Family History  Problem Relation Age of Onset  . Stroke Mother   . Colon cancer Neg Hx   . Gastric cancer Neg Hx   . Esophageal cancer Neg Hx     Social History   Socioeconomic History  . Marital status: Widowed    Spouse name: Not on file  . Number of children: Not on file  .  Years of education: Not on file  . Highest education level: Not on file  Occupational History  . Not on file  Social Needs  . Financial resource strain: Not on file  . Food insecurity:    Worry: Not on file    Inability: Not on file  . Transportation needs:    Medical: Not on file    Non-medical: Not on file  Tobacco Use  . Smoking status: Current Every Day Smoker    Packs/day: 0.50    Years: 50.00    Pack years: 25.00    Types: Cigarettes  . Smokeless tobacco: Never Used  . Tobacco comment: 10 cigarettes daily  Substance and Sexual Activity  . Alcohol use: No  . Drug use: No  . Sexual activity: Not Currently    Birth control/protection: Surgical  Lifestyle  . Physical activity:    Days per week: Not on file    Minutes per session: Not on file  . Stress: Not  on file  Relationships  . Social connections:    Talks on phone: Not on file    Gets together: Not on file    Attends religious service: Not on file    Active member of club or organization: Not on file    Attends meetings of clubs or organizations: Not on file    Relationship status: Not on file  Other Topics Concern  . Not on file  Social History Narrative  . Not on file    Review of Systems: Complete ROS negative except as per HPI.   Physical Exam: BP 136/86   Pulse (!) 104   Temp (!) 97.5 F (36.4 C) (Oral)   Ht 5' 2"  (1.575 m)   Wt 121 lb 12.8 oz (55.2 kg)   BMI 22.28 kg/m  General:   Alert and oriented. Pleasant and cooperative. Well-nourished and well-developed.  Eyes:  Without icterus, sclera clear and conjunctiva pink.  Ears:  Normal auditory acuity. Cardiovascular:  S1, S2 present without murmurs appreciated. Extremities without clubbing or edema. Respiratory:  Clear to auscultation bilaterally. No wheezes, rales, or rhonchi. No distress.  Gastrointestinal:  +BS, soft, and non-distended. Mild epigastric TTP. No HSM noted. No guarding or rebound. No masses appreciated.  Rectal:  Deferred    Musculoskalatal:  Symmetrical without gross deformities. Neurologic:  Alert and oriented x4;  grossly normal neurologically. Psych:  Alert and cooperative. Normal mood and affect. Heme/Lymph/Immune: No excessive bruising noted.    11/24/2017 10:16 AM   Disclaimer: This note was dictated with voice recognition software. Similar sounding words can inadvertently be transcribed and may not be corrected upon review.

## 2017-11-24 NOTE — Assessment & Plan Note (Addendum)
The patient has GERD which seems to be worsening.  EGD documented esophagitis and gastritis likely due to daily aspirin.  She continues on aspirin.  She denies any knowledge of need for Protonix.  I reviewed her AVS instructions with her from her EGD and from her last office visit.  I again recommended she take Protonix 40 mg once a day on an empty stomach, first thing in the morning.  I sent a new prescription to your pharmacy.  Change Zantac to in the evenings, if needed.  Follow-up in 6 months.

## 2017-11-24 NOTE — Progress Notes (Signed)
cc'ed to pcp °

## 2017-11-24 NOTE — Patient Instructions (Signed)
1. As we discussed to take Protonix 40 mg once a day, every day. 2. You can use Zantac in the evenings if needed. 3. Return for follow-up in 6 months. 4. Call us if you have any questions or concerns.  At [facility] we value your feedback. You may receive a survey about your visit today. Please share your experience as we strive to create trusting relationships with our patients to provide genuine, compassionate, quality care.  We appreciate your understanding and patience as we review any laboratory studies, imaging, and other diagnostic tests that are ordered as we care for you. Our office policy is 5 business days for review of these results, and any emergent or urgent results are addressed in a timely manner for your best interest. If you do not hear from our office in 1 week, please contact us.   We also encourage the use of MyChart, which contains your medical information for your review as well. If you are not enrolled in this feature, an access code is on this after visit summary for your convenience. Thank you for allowing [place] to be involved in your care.  It was great to see you today!  I hope you have a great Fall!!

## 2017-11-24 NOTE — Assessment & Plan Note (Signed)
Chronic epigastric discomfort and burning.  Likely due to GERD.  I spent some time explaining to her the pathophysiology behind GERD and why she has her symptoms.  I discussed why Protonix would help control her symptoms.  I again recommended she take this indefinitely while on aspirin.  Follow-up in 6 months.

## 2017-11-25 ENCOUNTER — Inpatient Hospital Stay (HOSPITAL_COMMUNITY): Payer: Medicare Other | Attending: Internal Medicine

## 2017-11-25 DIAGNOSIS — Z452 Encounter for adjustment and management of vascular access device: Secondary | ICD-10-CM | POA: Diagnosis not present

## 2017-11-25 DIAGNOSIS — Z853 Personal history of malignant neoplasm of breast: Secondary | ICD-10-CM | POA: Insufficient documentation

## 2017-11-25 MED ORDER — SODIUM CHLORIDE 0.9% FLUSH
10.0000 mL | Freq: Once | INTRAVENOUS | Status: AC
  Administered 2017-11-25: 10 mL

## 2017-11-25 MED ORDER — HEPARIN SOD (PORK) LOCK FLUSH 100 UNIT/ML IV SOLN
500.0000 [IU] | Freq: Once | INTRAVENOUS | Status: AC
  Administered 2017-11-25: 500 [IU] via INTRAVENOUS

## 2017-11-25 NOTE — Patient Instructions (Signed)
[place] at [facility]  Discharge Instructions:  Port Flush _______________________________________________________________  Thank you for choosing [place] at [facility] to provide your oncology and hematology care.  To afford each patient quality time with our providers, please arrive at least 15 minutes before your scheduled appointment.  You need to re-schedule your appointment if you arrive 10 or more minutes late.  We strive to give you quality time with our providers, and arriving late affects you and other patients whose appointments are after yours.  Also, if you no show three or more times for appointments you may be dismissed from the clinic.  Again, thank you for choosing [place] at [facility] hope is that these requests will allow you access to exceptional care and in a timely manner. _______________________________________________________________  If you have questions after your visit, please contact our office at [phone] between the hours of 8:30 a.m. and 5:00 p.m. Voicemails left after 4:30 p.m. will not be returned until the following business day. _______________________________________________________________  For prescription refill requests, have your pharmacy contact our office. _______________________________________________________________  Recommendations made by the consultant and any test results will be sent to your referring physician. _______________________________________________________________

## 2017-11-25 NOTE — Progress Notes (Signed)
Pt here for port flush today. VSS. No complaints. F/U appt schedule given to patient.    Discharged from clinic ambulatory. F/U with [facility] as scheduled.

## 2017-12-02 DIAGNOSIS — K121 Other forms of stomatitis: Secondary | ICD-10-CM | POA: Diagnosis not present

## 2017-12-02 DIAGNOSIS — E538 Deficiency of other specified B group vitamins: Secondary | ICD-10-CM | POA: Diagnosis not present

## 2017-12-02 DIAGNOSIS — J029 Acute pharyngitis, unspecified: Secondary | ICD-10-CM | POA: Diagnosis not present

## 2017-12-02 DIAGNOSIS — Z6820 Body mass index (BMI) 20.0-20.9, adult: Secondary | ICD-10-CM | POA: Diagnosis not present

## 2017-12-02 DIAGNOSIS — R1310 Dysphagia, unspecified: Secondary | ICD-10-CM | POA: Diagnosis not present

## 2017-12-18 ENCOUNTER — Other Ambulatory Visit (HOSPITAL_COMMUNITY): Payer: Medicare Other

## 2017-12-25 ENCOUNTER — Ambulatory Visit (HOSPITAL_COMMUNITY): Payer: Medicare Other | Admitting: Hematology

## 2017-12-29 DIAGNOSIS — J9801 Acute bronchospasm: Secondary | ICD-10-CM | POA: Diagnosis not present

## 2017-12-29 DIAGNOSIS — K219 Gastro-esophageal reflux disease without esophagitis: Secondary | ICD-10-CM | POA: Diagnosis not present

## 2017-12-29 DIAGNOSIS — J449 Chronic obstructive pulmonary disease, unspecified: Secondary | ICD-10-CM | POA: Diagnosis not present

## 2017-12-29 DIAGNOSIS — J329 Chronic sinusitis, unspecified: Secondary | ICD-10-CM | POA: Diagnosis not present

## 2018-01-14 DIAGNOSIS — I10 Essential (primary) hypertension: Secondary | ICD-10-CM | POA: Diagnosis not present

## 2018-01-14 DIAGNOSIS — I2510 Atherosclerotic heart disease of native coronary artery without angina pectoris: Secondary | ICD-10-CM | POA: Diagnosis not present

## 2018-01-14 DIAGNOSIS — I252 Old myocardial infarction: Secondary | ICD-10-CM | POA: Diagnosis not present

## 2018-01-20 ENCOUNTER — Other Ambulatory Visit (HOSPITAL_COMMUNITY): Payer: Medicare Other

## 2018-01-20 ENCOUNTER — Inpatient Hospital Stay (HOSPITAL_COMMUNITY): Payer: Medicare Other | Attending: Hematology

## 2018-01-20 DIAGNOSIS — Z853 Personal history of malignant neoplasm of breast: Secondary | ICD-10-CM | POA: Diagnosis not present

## 2018-01-20 DIAGNOSIS — Z452 Encounter for adjustment and management of vascular access device: Secondary | ICD-10-CM | POA: Insufficient documentation

## 2018-01-20 DIAGNOSIS — C50312 Malignant neoplasm of lower-inner quadrant of left female breast: Secondary | ICD-10-CM

## 2018-01-20 LAB — COMPREHENSIVE METABOLIC PANEL
ALK PHOS: 77 U/L (ref 38–126)
ALT: 14 U/L (ref 0–44)
AST: 17 U/L (ref 15–41)
Albumin: 4 g/dL (ref 3.5–5.0)
Anion gap: 11 (ref 5–15)
BILIRUBIN TOTAL: 0.6 mg/dL (ref 0.3–1.2)
BUN: 9 mg/dL (ref 8–23)
CALCIUM: 9 mg/dL (ref 8.9–10.3)
CO2: 24 mmol/L (ref 22–32)
CREATININE: 0.93 mg/dL (ref 0.44–1.00)
Chloride: 105 mmol/L (ref 98–111)
GFR calc Af Amer: >60 mL/min (ref >60)
GFR, EST NON AFRICAN AMERICAN: 57 mL/min — AB (ref >60)
Glucose, Bld: 92 mg/dL (ref 70–99)
POTASSIUM: 4 mmol/L (ref 3.5–5.1)
Sodium: 140 mmol/L (ref 135–145)
TOTAL PROTEIN: 7.6 g/dL (ref 6.5–8.1)

## 2018-01-20 LAB — CBC WITH DIFFERENTIAL/PLATELET
Abs Immature Granulocytes: 0.02 10*3/uL (ref 0.00–0.07)
Basophils Absolute: 0 10*3/uL (ref 0.0–0.1)
Basophils Relative: 1 %
Eosinophils Absolute: 0.1 10*3/uL (ref 0.0–0.5)
Eosinophils Relative: 2 %
HEMATOCRIT: 42.3 % (ref 36.0–46.0)
Hemoglobin: 13.1 g/dL (ref 12.0–15.0)
Immature Granulocytes: 0 %
Lymphocytes Relative: 32 %
Lymphs Abs: 2.2 10*3/uL (ref 0.7–4.0)
MCH: 30 pg (ref 26.0–34.0)
MCHC: 31 g/dL (ref 30.0–36.0)
MCV: 96.8 fL (ref 80.0–100.0)
MONO ABS: 0.4 10*3/uL (ref 0.1–1.0)
Monocytes Relative: 7 %
NEUTROS ABS: 3.9 10*3/uL (ref 1.7–7.7)
NEUTROS PCT: 58 %
NRBC: 0 % (ref 0.0–0.2)
Platelets: 309 10*3/uL (ref 150–400)
RBC: 4.37 MIL/uL (ref 3.87–5.11)
RDW: 13.2 % (ref 11.5–15.5)
WBC: 6.7 10*3/uL (ref 4.0–10.5)

## 2018-01-21 MED ORDER — DIPHENHYDRAMINE HCL 50 MG/ML IJ SOLN
INTRAMUSCULAR | Status: AC
  Filled 2018-01-21: qty 1

## 2018-01-21 MED ORDER — FAMOTIDINE IN NACL 20-0.9 MG/50ML-% IV SOLN
INTRAVENOUS | Status: AC
  Filled 2018-01-21: qty 50

## 2018-01-21 MED ORDER — PALONOSETRON HCL INJECTION 0.25 MG/5ML
INTRAVENOUS | Status: AC
  Filled 2018-01-21: qty 5

## 2018-01-27 ENCOUNTER — Ambulatory Visit (HOSPITAL_COMMUNITY): Payer: Medicare Other | Admitting: Hematology

## 2018-01-27 ENCOUNTER — Encounter (HOSPITAL_COMMUNITY): Payer: Self-pay | Admitting: Hematology

## 2018-01-27 ENCOUNTER — Inpatient Hospital Stay (HOSPITAL_BASED_OUTPATIENT_CLINIC_OR_DEPARTMENT_OTHER): Payer: Medicare Other | Admitting: Hematology

## 2018-01-27 ENCOUNTER — Other Ambulatory Visit: Payer: Self-pay

## 2018-01-27 DIAGNOSIS — Z853 Personal history of malignant neoplasm of breast: Secondary | ICD-10-CM | POA: Diagnosis not present

## 2018-01-27 DIAGNOSIS — C50312 Malignant neoplasm of lower-inner quadrant of left female breast: Secondary | ICD-10-CM

## 2018-01-27 DIAGNOSIS — Z452 Encounter for adjustment and management of vascular access device: Secondary | ICD-10-CM | POA: Diagnosis not present

## 2018-01-27 NOTE — Patient Instructions (Addendum)
[place] at [facility] Discharge Instructions  You were seen today by Dr. Delton Coombes. He went over your recent lab results. He also went over how you are feeling and how things are going. We will see you back in 6 months for labs and follow up. Try using Lamisil cream over the counter on the area under your breast.    Thank you for choosing [place] at [facility] to provide your oncology and hematology care.  To afford each patient quality time with our provider, please arrive at least 15 minutes before your scheduled appointment time.   If you have a lab appointment with the [place] please come in thru the  Main Entrance and check in at the main information desk  You need to re-schedule your appointment should you arrive 10 or more minutes late.  We strive to give you quality time with our providers, and arriving late affects you and other patients whose appointments are after yours.  Also, if you no show three or more times for appointments you may be dismissed from the clinic at the providers discretion.     Again, thank you for choosing [facility].  Our hope is that these requests will decrease the amount of time that you wait before being seen by our physicians.       _____________________________________________________________  Should you have questions after your visit to [facility], please contact our office at [phone] between the hours of 8:00 a.m. and 4:30 p.m.  Voicemails left after 4:00 p.m. will not be returned until the following business day.  For prescription refill requests, have your pharmacy contact our office and allow 72 hours.    Cancer Center Support Programs:   > Cancer Support Group  2nd Tuesday of the month 1pm-2pm, Journey Room

## 2018-01-27 NOTE — Assessment & Plan Note (Signed)
1.  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 Jenkins on 01/16/2017, showing a 1.8 cm primary tumor, grade 3, with alvei, clear surgical margins, with high-grade DCIS.  No lymph nodes could be removed due to her past history of left breast cancer treatment. -Adjuvant chemotherapy with 4 cycles of TC was recommended. - PET/CT scan on 03/27/2017 showed mildly hypermetabolic small prevascular lymph node and right lower paratracheal lymph node are indeterminate.  Moderate metabolic activity associated with a ill-defined left upper lobe nodule. -Patient recently had a PET CT scan on 08/17/2017 showing mild reduction in size and activity of the ill-defined left upper lobe pulmonary nodules, favoring benign etiology.  Stable small prevascular lymph nodes and reduced size and activity of the lower paratracheal lymph node.  Overall appearance suggests reactive/inflammatory etiology. - Adjuvant chemotherapy was not recommended as more than 7 months have elapsed from her surgery, when she was seen in office. - Today's examination did not reveal any suspicious masses. -We reviewed right breast mammogram report dated 08/26/2017 which was BI-RADS Category 1. -We have also reviewed her blood work which was within normal limits. - She will come back in 6 months for follow-up. - For her right inframammary rash, we have recommended using terbinafine cream twice a day.  2.  History of node positive left breast cancer: - Diagnosed on 02/16/2001, T1c N1 M0, stage IIa, ER/PR positive, HER-2 negative, 1.5 cm, 1 out of 6 lymph nodes positive. 

## 2018-01-27 NOTE — Progress Notes (Signed)
[address] [place], [place] [id number]   CLINIC:  Medical Oncology/Hematology  PCP:  [facility], [place] [address] [phone]   REASON FOR VISIT:  Follow-up for breast cancer, ER-/PR-/HER2-  CURRENT THERAPY: observation  BRIEF ONCOLOGIC HISTORY:    Malignant neoplasm of lower-inner quadrant of left female breast ([place])   08/21/2016 Pathology Results    Left needle core biopsy, lower-inner quadrant: invasive ductal carcinoma and DCIS.  ER/PR-, HER2-.     01/16/2017 Surgery    Left simple mastectomy by Dr. Alean Rinne ductal carcinoma, grade 3, measuring 1.8 cm with DCIS (high-grade).  Lymphovascular invasion is identified.  Clear surgical margins.      CANCER STAGING: Cancer Staging History of left breast cancer (2003) Staging form: Breast, AJCC 7th Edition - Clinical: Stage IIA (T1c, N1, cM0) - Signed by Baird Cancer, PA-C on 11/23/2012  Malignant neoplasm of lower-inner quadrant of left female breast [facility]) Staging form: Breast, AJCC 8th Edition - Pathologic stage from 01/19/2017: Stage Unknown (pT1c, pNX, cM0, G3, ER: Negative, PR: Negative, HER2: Negative) - Signed by Baird Cancer, PA-C on 02/27/2017    INTERVAL HISTORY:  Margaret Thompson 85 y.o. female returns for follow-up of left breast cancer.  She has mild tenderness in the axillary region at the surgical site since surgery which is also stable.  She complained of itchy rash under the right breast.  Denies any new onset pains.  She complains of feeling tired all the time.  Denies any recent hospitalizations or infections.  Denies any fevers, night sweats or weight loss in the last 6 months.    REVIEW OF SYSTEMS:  Review of Systems  Constitutional: Positive for fatigue.  Respiratory: Negative for shortness of breath.   Hematological: Does not bruise/bleed easily.  All other systems reviewed and are negative.    PAST MEDICAL/SURGICAL HISTORY:    Past Medical History:  Diagnosis Date  . Acute MI ([place]) 2003  . Adenocarcinoma of left breast ([place]) 11/23/2012   Stage II (T1 C. N1 M0) adenocarcinoma of the left breast with surgery on 03/08/2001. She had a 1.5 cm cancer with 1/6 positive sentinel nodes. Reexcision margins were clear. She did participate in NSABP B.-30 randomized to Adriamycin and Taxotere for 4 cycles followed by radiation therapy from 07/13/2001 to 08/27/2001. She then started Arimidex on 07/07/2001 took that until the end of June 2008. T  . Anemia   . Bilateral cataracts   . Breast cancer ([place])   . GERD (gastroesophageal reflux disease)   . History of gout   . Hypercholesteremia   . Hypertension   . Malignant neoplasm of lower-inner quadrant of left female breast ([place])   . Osteoporosis    Past Surgical History:  Procedure Laterality Date  . ABDOMINAL HYSTERECTOMY    . BREAST SURGERY    . CATARACT EXTRACTION W/PHACO Left 09/04/2014   Procedure: CATARACT EXTRACTION PHACO AND INTRAOCULAR LENS PLACEMENT (IOC);  Surgeon: Tonny Branch, MD;  Location: [facility];  Service: Ophthalmology;  Laterality: Left;  CDE:9.84  . CATARACT EXTRACTION W/PHACO Right 11/09/2014   Procedure: CATARACT EXTRACTION PHACO AND INTRAOCULAR LENS PLACEMENT (IOC);  Surgeon: Tonny Branch, MD;  Location: [facility];  Service: Ophthalmology;  Laterality: Right;  CDE:8.40  . CORONARY ANGIOPLASTY     RCA stent  . ESOPHAGOGASTRODUODENOSCOPY (EGD) WITH PROPOFOL N/A 08/04/2017   Procedure: ESOPHAGOGASTRODUODENOSCOPY (EGD) WITH PROPOFOL;  Surgeon: Danie Binder, MD;  Location: [facility];  Service: Endoscopy;  Laterality: N/A;  7:30am  . LEFT HEART CATHETERIZATION WITH CORONARY ANGIOGRAM N/A 12/21/2012   Procedure: LEFT HEART CATHETERIZATION WITH CORONARY ANGIOGRAM;  Surgeon: Clent Demark, MD;  Location: [facility] CATH LAB;  Service: Cardiovascular;  Laterality: N/A;  . MASTECTOMY MODIFIED RADICAL Left 01/16/2017   Procedure: SIMPLE MASTECTOMY;  Surgeon: Aviva Signs,  MD;  Location: [facility];  Service: General;  Laterality: Left;  Marland Kitchen MASTECTOMY, PARTIAL Left   . PERCUTANEOUS STENT INTERVENTION     RCA  . PORT-A-CATH REMOVAL    . PORTACATH PLACEMENT    . PORTACATH PLACEMENT Right 03/11/2017   Procedure: INSERTION PORT-A-CATH;  Surgeon: Aviva Signs, MD;  Location: [facility];  Service: General;  Laterality: Right;  . SAVORY DILATION N/A 08/04/2017   Procedure: SAVORY DILATION;  Surgeon: Danie Binder, MD;  Location: [facility];  Service: Endoscopy;  Laterality: N/A;     SOCIAL HISTORY:  Social History   Socioeconomic History  . Marital status: Widowed    Spouse name: Not on file  . Number of children: Not on file  . Years of education: Not on file  . Highest education level: Not on file  Occupational History  . Not on file  Social Needs  . Financial resource strain: Not on file  . Food insecurity:    Worry: Not on file    Inability: Not on file  . Transportation needs:    Medical: Not on file    Non-medical: Not on file  Tobacco Use  . Smoking status: Current Every Day Smoker    Packs/day: 0.50    Years: 50.00    Pack years: 25.00    Types: Cigarettes  . Smokeless tobacco: Never Used  . Tobacco comment: 10 cigarettes daily  Substance and Sexual Activity  . Alcohol use: No  . Drug use: No  . Sexual activity: Not Currently    Birth control/protection: Surgical  Lifestyle  . Physical activity:    Days per week: Not on file    Minutes per session: Not on file  . Stress: Not on file  Relationships  . Social connections:    Talks on phone: Not on file    Gets together: Not on file    Attends religious service: Not on file    Active member of club or organization: Not on file    Attends meetings of clubs or organizations: Not on file    Relationship status: Not on file  . Intimate partner violence:    Fear of current or ex partner: Not on file    Emotionally abused: Not on file    Physically abused: Not on file    Forced sexual  activity: Not on file  Other Topics Concern  . Not on file  Social History Narrative  . Not on file    FAMILY HISTORY:  Family History  Problem Relation Age of Onset  . Stroke Mother   . Colon cancer Neg Hx   . Gastric cancer Neg Hx   . Esophageal cancer Neg Hx     CURRENT MEDICATIONS:  Outpatient Encounter Medications as of 01/27/2018  Medication Sig Note  . Artificial Tear Solution (SOOTHE XP OP) Place 1-2 drops into both eyes daily as needed (dry eye).    Marland Kitchen aspirin EC 81 MG tablet Take 81 mg by mouth daily.   Marland Kitchen atorvastatin (LIPITOR) 40 MG tablet Take 40 mg by mouth every evening.    Marland Kitchen CARTIA XT 180 MG 24 hr capsule Take 180  mg by mouth daily.   . Cyanocobalamin (B-12 COMPLIANCE INJECTION) 1000 MCG/ML KIT Inject as directed every 30 (thirty) days.  09/08/2017: Received on 09/07/2017  . pantoprazole (PROTONIX) 40 MG tablet Take 1 tablet (40 mg total) by mouth daily.   . ranitidine (ZANTAC) 150 MG tablet Take 150 mg by mouth daily as needed for heartburn.    No facility-administered encounter medications on file as of 01/27/2018.     ALLERGIES:  Allergies  Allergen Reactions  . Azithromycin Hives     PHYSICAL EXAM:  ECOG Performance status: 1  Vitals:   01/27/18 1443  BP: 133/62  Pulse: 100  Temp: 98.2 F (36.8 C)   Filed Weights   01/27/18 1443  Weight: 119 lb 4.8 oz (54.1 kg)    Physical Exam Cardiovascular:     Rate and Rhythm: Normal rate and regular rhythm.     Heart sounds: Normal heart sounds.  Pulmonary:     Effort: Pulmonary effort is normal.     Breath sounds: Normal breath sounds.  Skin:    General: Skin is warm and dry.  Neurological:     Mental Status: She is alert and oriented to person, place, and time.   Left mastectomy site has no suspicious nodules.  There is slight thickening in the left midaxillary line which has been stable since surgery.  This is slightly tender.  Right breast has no palpable masses.  There is a circumferential rash  with central clearing in the infra mammary fold.  No palpable adenopathy.  No palpable hepatosplenomegaly.   LABORATORY DATA:  I have reviewed the labs as listed.  CBC    Component Value Date/Time   WBC 6.7 01/20/2018 1451   RBC 4.37 01/20/2018 1451   HGB 13.1 01/20/2018 1451   HCT 42.3 01/20/2018 1451   PLT 309 01/20/2018 1451   MCV 96.8 01/20/2018 1451   MCH 30.0 01/20/2018 1451   MCHC 31.0 01/20/2018 1451   RDW 13.2 01/20/2018 1451   LYMPHSABS 2.2 01/20/2018 1451   MONOABS 0.4 01/20/2018 1451   EOSABS 0.1 01/20/2018 1451   BASOSABS 0.0 01/20/2018 1451   CMP Latest Ref Rng & Units 01/20/2018 09/08/2017 07/29/2017  Glucose 70 - 99 mg/dL 92 103(H) 85  BUN 8 - 23 mg/dL 9 7(L) 9  Creatinine 0.44 - 1.00 mg/dL 0.93 0.84 0.96  Sodium 135 - 145 mmol/L 140 141 139  Potassium 3.5 - 5.1 mmol/L 4.0 3.6 3.8  Chloride 98 - 111 mmol/L 105 108 106  CO2 22 - 32 mmol/L _0 Calcium 8.9 - 10.3 mg/dL 9.0 9.5 9.3  Total Protein 6.5 - 8.1 g/dL 7.6 8.2(H) -  Total Bilirubin 0.3 - 1.2 mg/dL 0.6 0.6 -  Alkaline Phos 38 - 126 U/L 77 82 -  AST 15 - 41 U/L 17 17 -  ALT 0 - 44 U/L 14 12 -     I have independently reviewed the scans and discussed with the patient.      ASSESSMENT & PLAN:   Malignant neoplasm of lower-inner quadrant of left female breast ([place]) 1.  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 Arnoldo Morale on 01/16/2017, showing a 1.8 cm primary tumor, grade 3, with alvei, clear surgical margins, with high-grade DCIS.  No lymph nodes could be removed due  to her past history of left breast cancer treatment. -Adjuvant chemotherapy with 4 cycles of TC was recommended. - PET/CT scan on 03/27/2017 showed mildly hypermetabolic small prevascular lymph node and right lower  paratracheal lymph node are indeterminate.  Moderate metabolic activity associated with a ill-defined left upper lobe nodule. -Patient recently had a PET CT scan on 08/17/2017 showing mild reduction in size and activity of the ill-defined left upper lobe pulmonary nodules, favoring benign etiology.  Stable small prevascular lymph nodes and reduced size and activity of the lower paratracheal lymph node.  Overall appearance suggests reactive/inflammatory etiology. - Adjuvant chemotherapy was not recommended as more than 7 months have elapsed from her surgery, when she was seen in office. - Today's examination did not reveal any suspicious masses. -We reviewed right breast mammogram report dated 08/26/2017 which was BI-RADS Category 1. -We have also reviewed her blood work which was within normal limits. - She will come back in 6 months for follow-up. - For her right inframammary rash, we have recommended using terbinafine cream twice a day.  2.  History of node positive left breast cancer: - Diagnosed on 02/16/2001, T1c N1 M0, stage IIa, ER/PR positive, HER-2 negative, 1.5 cm, 1 out of 6 lymph nodes positive.      Orders placed this encounter:  No orders of the defined types were placed in this encounter.     Derek Jack, MD [place] [phone]

## 2018-02-03 ENCOUNTER — Encounter (HOSPITAL_COMMUNITY): Payer: Medicare Other

## 2018-02-04 ENCOUNTER — Other Ambulatory Visit: Payer: Self-pay

## 2018-02-04 ENCOUNTER — Inpatient Hospital Stay (HOSPITAL_COMMUNITY): Payer: Medicare Other

## 2018-02-04 ENCOUNTER — Encounter (HOSPITAL_COMMUNITY): Payer: Self-pay

## 2018-02-04 DIAGNOSIS — Z452 Encounter for adjustment and management of vascular access device: Secondary | ICD-10-CM | POA: Diagnosis not present

## 2018-02-04 DIAGNOSIS — Z853 Personal history of malignant neoplasm of breast: Secondary | ICD-10-CM | POA: Diagnosis not present

## 2018-02-04 MED ORDER — SODIUM CHLORIDE 0.9% FLUSH
10.0000 mL | Freq: Once | INTRAVENOUS | Status: AC
  Administered 2018-02-04: 10 mL

## 2018-02-04 MED ORDER — HEPARIN SOD (PORK) LOCK FLUSH 100 UNIT/ML IV SOLN
500.0000 [IU] | Freq: Once | INTRAVENOUS | Status: AC
  Administered 2018-02-04: 500 [IU] via INTRAVENOUS

## 2018-02-04 NOTE — Progress Notes (Signed)
Pt presents today for port flush. VSS. Pt has no complaints of any changes since the last visit. MAR reviewed. Port flushed with 87mls of NS and Heparin 500 units. Discharged from clinic ambulatory. F/U with [facility] as scheduled. Appt schedule printed off and given to patient.

## 2018-02-04 NOTE — Patient Instructions (Signed)
[facility]  Discharge Instructions:   _______________________________________________________________  Thank you for choosing [facility] at [facility] to provide your oncology and hematology care.  To afford each patient quality time with our providers, please arrive at least 15 minutes before your scheduled appointment.  You need to re-schedule your appointment if you arrive 10 or more minutes late.  We strive to give you quality time with our providers, and arriving late affects you and other patients whose appointments are after yours.  Also, if you no show three or more times for appointments you may be dismissed from the clinic.  Again, thank you for choosing  [facility]. Our hope is that these requests will allow you access to exceptional care and in a timely manner. _______________________________________________________________  If you have questions after your visit, please contact our office at [phone] between the hours of 8:30 a.m. and 5:00 p.m. Voicemails left after 4:30 p.m. will not be returned until the following business day. _______________________________________________________________  For prescription refill requests, have your pharmacy contact our office. _______________________________________________________________  Recommendations made by the consultant and any test results will be sent to your referring physician. _______________________________________________________________ 

## 2018-02-10 ENCOUNTER — Ambulatory Visit (HOSPITAL_COMMUNITY): Payer: Medicare Other | Admitting: Hematology

## 2018-02-11 DIAGNOSIS — H26491 Other secondary cataract, right eye: Secondary | ICD-10-CM | POA: Diagnosis not present

## 2018-02-11 DIAGNOSIS — H26492 Other secondary cataract, left eye: Secondary | ICD-10-CM | POA: Diagnosis not present

## 2018-02-11 DIAGNOSIS — H04123 Dry eye syndrome of bilateral lacrimal glands: Secondary | ICD-10-CM | POA: Diagnosis not present

## 2018-02-11 DIAGNOSIS — Z961 Presence of intraocular lens: Secondary | ICD-10-CM | POA: Diagnosis not present

## 2018-02-12 ENCOUNTER — Other Ambulatory Visit: Payer: Self-pay | Admitting: Nurse Practitioner

## 2018-02-12 DIAGNOSIS — K219 Gastro-esophageal reflux disease without esophagitis: Secondary | ICD-10-CM

## 2018-03-01 ENCOUNTER — Ambulatory Visit (HOSPITAL_COMMUNITY)
Admission: RE | Admit: 2018-03-01 | Discharge: 2018-03-01 | Disposition: A | Discharge status: Home / Self Care | Payer: Medicare Other | Source: Ambulatory Visit | Attending: Family Medicine | Admitting: Family Medicine

## 2018-03-01 ENCOUNTER — Other Ambulatory Visit (HOSPITAL_COMMUNITY): Payer: Self-pay | Admitting: Family Medicine

## 2018-03-01 DIAGNOSIS — S8992XA Unspecified injury of left lower leg, initial encounter: Secondary | ICD-10-CM | POA: Diagnosis not present

## 2018-03-01 DIAGNOSIS — M25562 Pain in left knee: Secondary | ICD-10-CM | POA: Diagnosis not present

## 2018-03-01 DIAGNOSIS — S8392XA Sprain of unspecified site of left knee, initial encounter: Secondary | ICD-10-CM | POA: Diagnosis not present

## 2018-03-01 DIAGNOSIS — M7989 Other specified soft tissue disorders: Secondary | ICD-10-CM | POA: Diagnosis not present

## 2018-03-01 DIAGNOSIS — D519 Vitamin B12 deficiency anemia, unspecified: Secondary | ICD-10-CM | POA: Diagnosis not present

## 2018-04-20 DIAGNOSIS — J45909 Unspecified asthma, uncomplicated: Secondary | ICD-10-CM | POA: Diagnosis not present

## 2018-04-20 DIAGNOSIS — I252 Old myocardial infarction: Secondary | ICD-10-CM | POA: Diagnosis not present

## 2018-04-20 DIAGNOSIS — I2510 Atherosclerotic heart disease of native coronary artery without angina pectoris: Secondary | ICD-10-CM | POA: Diagnosis not present

## 2018-04-20 DIAGNOSIS — I10 Essential (primary) hypertension: Secondary | ICD-10-CM | POA: Diagnosis not present

## 2018-04-20 DIAGNOSIS — E785 Hyperlipidemia, unspecified: Secondary | ICD-10-CM | POA: Diagnosis not present

## 2018-05-01 ENCOUNTER — Other Ambulatory Visit: Payer: Self-pay | Admitting: Nurse Practitioner

## 2018-05-03 DIAGNOSIS — Z0001 Encounter for general adult medical examination with abnormal findings: Secondary | ICD-10-CM | POA: Diagnosis not present

## 2018-05-03 DIAGNOSIS — Z1389 Encounter for screening for other disorder: Secondary | ICD-10-CM | POA: Diagnosis not present

## 2018-05-03 DIAGNOSIS — J449 Chronic obstructive pulmonary disease, unspecified: Secondary | ICD-10-CM | POA: Diagnosis not present

## 2018-05-03 DIAGNOSIS — E7849 Other hyperlipidemia: Secondary | ICD-10-CM | POA: Diagnosis not present

## 2018-05-05 ENCOUNTER — Encounter (HOSPITAL_COMMUNITY): Payer: Medicare Other

## 2018-05-10 ENCOUNTER — Encounter (HOSPITAL_COMMUNITY): Payer: Medicare Other

## 2018-05-19 DIAGNOSIS — E538 Deficiency of other specified B group vitamins: Secondary | ICD-10-CM | POA: Diagnosis not present

## 2018-05-21 ENCOUNTER — Inpatient Hospital Stay (HOSPITAL_COMMUNITY): Payer: Medicare Other | Attending: Hematology

## 2018-05-21 ENCOUNTER — Other Ambulatory Visit: Payer: Self-pay

## 2018-05-21 ENCOUNTER — Encounter (HOSPITAL_COMMUNITY): Payer: Self-pay

## 2018-05-21 DIAGNOSIS — Z452 Encounter for adjustment and management of vascular access device: Secondary | ICD-10-CM | POA: Diagnosis not present

## 2018-05-21 DIAGNOSIS — Z853 Personal history of malignant neoplasm of breast: Secondary | ICD-10-CM | POA: Diagnosis not present

## 2018-05-21 MED ORDER — HEPARIN SOD (PORK) LOCK FLUSH 100 UNIT/ML IV SOLN
500.0000 [IU] | Freq: Once | INTRAVENOUS | Status: AC
  Administered 2018-05-21: 500 [IU] via INTRAVENOUS

## 2018-05-21 MED ORDER — SODIUM CHLORIDE 0.9% FLUSH
10.0000 mL | Freq: Once | INTRAVENOUS | Status: AC
  Administered 2018-05-21: 10 mL

## 2018-05-21 NOTE — Progress Notes (Signed)
Vital signs stable. No complaints at this time. Discharged from clinic ambulatory. F/U with [facility] as scheduled.

## 2018-05-26 ENCOUNTER — Encounter: Payer: Self-pay | Admitting: Nurse Practitioner

## 2018-05-26 ENCOUNTER — Ambulatory Visit (INDEPENDENT_AMBULATORY_CARE_PROVIDER_SITE_OTHER): Payer: Medicare Other | Admitting: Nurse Practitioner

## 2018-05-26 ENCOUNTER — Other Ambulatory Visit: Payer: Self-pay

## 2018-05-26 DIAGNOSIS — K219 Gastro-esophageal reflux disease without esophagitis: Secondary | ICD-10-CM | POA: Diagnosis not present

## 2018-05-26 DIAGNOSIS — R1013 Epigastric pain: Secondary | ICD-10-CM

## 2018-05-26 DIAGNOSIS — G8929 Other chronic pain: Secondary | ICD-10-CM

## 2018-05-26 DIAGNOSIS — Z0000 Encounter for general adult medical examination without abnormal findings: Secondary | ICD-10-CM | POA: Diagnosis not present

## 2018-05-26 NOTE — Assessment & Plan Note (Signed)
The patient's last colonoscopy was in 2010.  Technically she was on recall for 10 years.  However, she is currently 42 and we generally do not do colonoscopies "just because "after age 85.  However, I asked her about her feelings on colonoscopy and she quickly indicated she would not have another one done.  I informed her I would make a note of this and to call if she changes her mind or has any significant symptoms.

## 2018-05-26 NOTE — Patient Instructions (Signed)
Your health issues we discussed today were:   GERD (reflux/heartburn): 1. As we discussed, take Protonix 40 mg once a day, every day, 30 minutes before your first meal the day 2. Call us if you have any severe or worsening symptoms 3. Continue to avoid trigger foods or other substances that tend to cause GERD symptoms.  Overall I recommend:  1. Continue your other current medications 2. Return for follow-up in 6 months 3. Call us if you have any questions or concerns.   Because of recent events of COVID-19 ("Coronavirus"), follow CDC recommendations:  1. Wash your hand frequently 2. Avoid touching your face 3. Stay away from people who are sick 4. If you have symptoms such as fever, cough, shortness of breath then call your healthcare provider for further guidance 5. If you are sick, STAY AT HOME unless otherwise directed by your healthcare provider. 6. Follow directions from state and national officials regarding staying safe   At [facility] we value your feedback. You may receive a survey about your visit today. Please share your experience as we strive to create trusting relationships with our patients to provide genuine, compassionate, quality care.  We appreciate your understanding and patience as we review any laboratory studies, imaging, and other diagnostic tests that are ordered as we care for you. Our office policy is 5 business days for review of these results, and any emergent or urgent results are addressed in a timely manner for your best interest. If you do not hear from our office in 1 week, please contact us.   We also encourage the use of MyChart, which contains your medical information for your review as well. If you are not enrolled in this feature, an access code is on this after visit summary for your convenience. Thank you for allowing [place] to be involved in your care.  It was great to see you today!  I hope you have a great day!!

## 2018-05-26 NOTE — Progress Notes (Signed)
Referring Provider: Redmond School, MD Primary Care Physician:  Redmond School, MD Primary GI:  Dr. Oneida Alar  NOTE: Service was provided via telemedicine and was requested by the patient due to COVID-19 pandemic.  Method of visit: Telephone  Patient Location: Home  Provider Location: Office  Reason for Phone Visit: Follow-up  The patient was consented to phone follow-up via telephone encounter including billing of the encounter (yes/no): Yes  Persons present on the phone encounter, with roles: None  Total time (minutes) spent on medical discussion: 22 minutes  Chief Complaint  Patient presents with  . Gastroesophageal Reflux    HPI:   Margaret Thompson is a 85 y.o. female who presents for virtual visit regarding: Follow-up on GERD and discuss surveillance colonoscopy.  The patient was last seen in our office 11/24/2017 for GERD and epigastric abdominal pain.  Noted history of adenocarcinoma of the left breast currently under the care of oncology.  EGD on file 2019 with moderate Schatzki's ring status post dilation, mild esophagitis, medium hiatal hernia, moderate gastritis/mild duodenitis due to aspirin use and "PRN" PPI.  Recommended Protonix daily indefinitely.  Her last visit still with throat and tongue burning with GERD, on daily aspirin, not on a PPI.  Taking Zantac pills half tab by PCP increase that to once daily.  Some nausea and vomiting.  No other GI complaints.  Recommended, again, take Protonix 40 mg once daily, Zantac in the evenings as needed, follow-up in 6 months.  Last colonoscopy completed 06/14/2008 which found multiple colon polyps ranging from 4 to 8 mm in size, 2 cecal AVMs, no evidence of active bleeding.  Pancolonic diverticulosis most pronounced in the left colon, large internal hemorrhoids.  Surgical pathology found the polyps to be a mix of tubular adenoma and hyperplastic.  Presumed recommended repeat colonoscopy in 10 years (2020) based on recall entry.   Today she states she's doing ok overall. Hasn't had any GERD flares in several days. Overall not as bad. Takes Protonix as needed. Has intermittent nausea/rare vomiting in the mornings after taking medications. Denies abdominal pain, hematochezia, melena, fever, chills, unintentional weight loss. Denies URI and flu-like symptoms. Denies loss of smell. Hasn't had a sense of taste since previous chemotherapy. Denies chest pain, dyspnea, dizziness, lightheadedness, syncope, near syncope. Denies any other upper or lower GI symptoms.  Declines a colonoscopy.  Past Medical History:  Diagnosis Date  . Acute MI ([place]) 2003  . Adenocarcinoma of left breast ([place]) 11/23/2012   Stage II (T1 C. N1 M0) adenocarcinoma of the left breast with surgery on 03/08/2001. She had a 1.5 cm cancer with 1/6 positive sentinel nodes. Reexcision margins were clear. She did participate in NSABP B.-30 randomized to Adriamycin and Taxotere for 4 cycles followed by radiation therapy from 07/13/2001 to 08/27/2001. She then started Arimidex on 07/07/2001 took that until the end of June 2008. T  . Anemia   . Bilateral cataracts   . Breast cancer ([place])   . GERD (gastroesophageal reflux disease)   . History of gout   . Hypercholesteremia   . Hypertension   . Malignant neoplasm of lower-inner quadrant of left female breast ([place])   . Osteoporosis     Past Surgical History:  Procedure Laterality Date  . ABDOMINAL HYSTERECTOMY    . BREAST SURGERY    . CATARACT EXTRACTION W/PHACO Left 09/04/2014   Procedure: CATARACT EXTRACTION PHACO AND INTRAOCULAR LENS PLACEMENT (IOC);  Surgeon: Tonny Branch, MD;  Location: [facility];  Service: Ophthalmology;  Laterality: Left;  CDE:9.84  . CATARACT EXTRACTION W/PHACO Right 11/09/2014   Procedure: CATARACT EXTRACTION PHACO AND INTRAOCULAR LENS PLACEMENT (IOC);  Surgeon: Tonny Branch, MD;  Location: [facility];  Service: Ophthalmology;  Laterality: Right;  CDE:8.40  . CORONARY ANGIOPLASTY     RCA stent  .  ESOPHAGOGASTRODUODENOSCOPY (EGD) WITH PROPOFOL N/A 08/04/2017   Procedure: ESOPHAGOGASTRODUODENOSCOPY (EGD) WITH PROPOFOL;  Surgeon: Danie Binder, MD;  [facility];  Service: Endoscopy;  Laterality: N/A;  7:30am  . LEFT HEART CATHETERIZATION WITH CORONARY ANGIOGRAM N/A 12/21/2012   Procedure: LEFT HEART CATHETERIZATION WITH CORONARY ANGIOGRAM;  Surgeon: Clent Demark, MD;  Location: [facility] CATH LAB;  Service: Cardiovascular;  Laterality: N/A;  . MASTECTOMY MODIFIED RADICAL Left 01/16/2017   Procedure: SIMPLE MASTECTOMY;  Surgeon: Aviva Signs, MD;  Location: [facility];  Service: General;  Laterality: Left;  Marland Kitchen MASTECTOMY, PARTIAL Left   . PERCUTANEOUS STENT INTERVENTION     RCA  . PORT-A-CATH REMOVAL    . PORTACATH PLACEMENT    . PORTACATH PLACEMENT Right 03/11/2017   Procedure: INSERTION PORT-A-CATH;  Surgeon: Aviva Signs, MD;  Location: [facility];  Service: General;  Laterality: Right;  . SAVORY DILATION N/A 08/04/2017   Procedure: SAVORY DILATION;  Surgeon: Danie Binder, MD;  [facility];  Service: Endoscopy;  Laterality: N/A;    Current Outpatient Medications  Medication Sig Dispense Refill  . Artificial Tear Solution (SOOTHE XP OP) Place 1-2 drops into both eyes daily as needed (dry eye).     Marland Kitchen aspirin EC 81 MG tablet Take 81 mg by mouth daily.    Marland Kitchen atorvastatin (LIPITOR) 40 MG tablet Take 40 mg by mouth every evening.     Marland Kitchen CARTIA XT 180 MG 24 hr capsule Take 180 mg by mouth daily.    . Cyanocobalamin (B-12 COMPLIANCE INJECTION) 1000 MCG/ML KIT Inject as directed every 30 (thirty) days.     . pantoprazole (PROTONIX) 40 MG tablet TAKE ONE TABLET (40MG TOTAL) BY MOUTH DAILY (Patient taking differently: Take 40 mg by mouth as needed. ) 90 tablet 3  . ranitidine (ZANTAC) 150 MG tablet Take 75 mg by mouth daily as needed for heartburn.      No current facility-administered medications for this visit.     Allergies as of 05/26/2018 - Review Complete 05/26/2018   Allergen Reaction Noted  . Azithromycin Hives 09/15/2010    Family History  Problem Relation Age of Onset  . Stroke Mother   . Colon cancer Neg Hx   . Gastric cancer Neg Hx   . Esophageal cancer Neg Hx     Social History   Socioeconomic History  . Marital status: Widowed    Spouse name: Not on file  . Number of children: Not on file  . Years of education: Not on file  . Highest education level: Not on file  Occupational History  . Not on file  Social Needs  . Financial resource strain: Not on file  . Food insecurity:    Worry: Not on file    Inability: Not on file  . Transportation needs:    Medical: Not on file    Non-medical: Not on file  Tobacco Use  . Smoking status: Current Every Day Smoker    Packs/day: 0.50    Years: 50.00    Pack years: 25.00    Types: Cigarettes  . Smokeless tobacco: Never Used  . Tobacco comment: 10 cigarettes daily  Substance and Sexual Activity  .  Alcohol use: No  . Drug use: No  . Sexual activity: Not Currently    Birth control/protection: Surgical  Lifestyle  . Physical activity:    Days per week: Not on file    Minutes per session: Not on file  . Stress: Not on file  Relationships  . Social connections:    Talks on phone: Not on file    Gets together: Not on file    Attends religious service: Not on file    Active member of club or organization: Not on file    Attends meetings of clubs or organizations: Not on file    Relationship status: Not on file  Other Topics Concern  . Not on file  Social History Narrative  . Not on file    Review of Systems: Complete ROS negative except as per HPI.  Physical Exam: Note: limited exam due to virtual visit General:   Alert and oriented. Pleasant and cooperative. Well-nourished and well-developed.  Head:  Normocephalic and atraumatic. Eyes:  Without icterus, sclera clear and conjunctiva pink.  Ears:  Normal auditory acuity. Skin:  Intact without facial significant lesions or  rashes. Neurologic:  Alert and oriented x4;  grossly normal neurologically. Psych:  Alert and cooperative. Normal mood and affect. Heme/Lymph/Immune: No excessive bruising noted.

## 2018-05-26 NOTE — Assessment & Plan Note (Signed)
GERD symptoms somewhat improved with "as needed" Protonix.  I recommended again that she take Protonix once a day every day.  Overall her GERD symptoms are improved.  She states she will start taking it every day.  She will call for refill if needed.  Otherwise continue other current medications and follow-up in 6 months.

## 2018-05-26 NOTE — Assessment & Plan Note (Signed)
Improvement in epigastric abdominal pain with improvement in GERD symptoms.  Recommend to take Protonix once daily, as per above.  Follow-up in 6 months.

## 2018-05-27 ENCOUNTER — Encounter: Payer: Self-pay | Admitting: Gastroenterology

## 2018-05-27 NOTE — Progress Notes (Signed)
CC'D TO PCP °

## 2018-06-28 DIAGNOSIS — H26492 Other secondary cataract, left eye: Secondary | ICD-10-CM | POA: Diagnosis not present

## 2018-07-05 DIAGNOSIS — Z1389 Encounter for screening for other disorder: Secondary | ICD-10-CM | POA: Diagnosis not present

## 2018-07-05 DIAGNOSIS — N342 Other urethritis: Secondary | ICD-10-CM | POA: Diagnosis not present

## 2018-07-05 DIAGNOSIS — E538 Deficiency of other specified B group vitamins: Secondary | ICD-10-CM | POA: Diagnosis not present

## 2018-07-21 DIAGNOSIS — I2510 Atherosclerotic heart disease of native coronary artery without angina pectoris: Secondary | ICD-10-CM | POA: Diagnosis not present

## 2018-07-21 DIAGNOSIS — I252 Old myocardial infarction: Secondary | ICD-10-CM | POA: Diagnosis not present

## 2018-07-21 DIAGNOSIS — I10 Essential (primary) hypertension: Secondary | ICD-10-CM | POA: Diagnosis not present

## 2018-07-27 ENCOUNTER — Other Ambulatory Visit (HOSPITAL_COMMUNITY): Payer: Self-pay | Admitting: *Deleted

## 2018-07-27 DIAGNOSIS — C50912 Malignant neoplasm of unspecified site of left female breast: Secondary | ICD-10-CM

## 2018-07-27 DIAGNOSIS — Z171 Estrogen receptor negative status [ER-]: Secondary | ICD-10-CM

## 2018-07-27 DIAGNOSIS — C50312 Malignant neoplasm of lower-inner quadrant of left female breast: Secondary | ICD-10-CM

## 2018-07-28 ENCOUNTER — Inpatient Hospital Stay (HOSPITAL_COMMUNITY): Payer: Medicare Other | Attending: Hematology

## 2018-07-28 ENCOUNTER — Other Ambulatory Visit: Payer: Self-pay

## 2018-07-28 ENCOUNTER — Encounter (HOSPITAL_COMMUNITY): Payer: Self-pay

## 2018-07-28 ENCOUNTER — Other Ambulatory Visit (HOSPITAL_COMMUNITY): Payer: Medicare Other

## 2018-07-28 VITALS — BP 148/60 | HR 88 | Temp 97.8°F | Resp 18

## 2018-07-28 DIAGNOSIS — Z853 Personal history of malignant neoplasm of breast: Secondary | ICD-10-CM | POA: Insufficient documentation

## 2018-07-28 DIAGNOSIS — Z452 Encounter for adjustment and management of vascular access device: Secondary | ICD-10-CM | POA: Diagnosis not present

## 2018-07-28 DIAGNOSIS — C50912 Malignant neoplasm of unspecified site of left female breast: Secondary | ICD-10-CM

## 2018-07-28 DIAGNOSIS — C50312 Malignant neoplasm of lower-inner quadrant of left female breast: Secondary | ICD-10-CM

## 2018-07-28 LAB — CBC WITH DIFFERENTIAL/PLATELET
Abs Immature Granulocytes: 0.02 10*3/uL (ref 0.00–0.07)
Basophils Absolute: 0.1 10*3/uL (ref 0.0–0.1)
Basophils Relative: 1 %
Eosinophils Absolute: 0.2 10*3/uL (ref 0.0–0.5)
Eosinophils Relative: 3 %
HCT: 42.1 % (ref 36.0–46.0)
Hemoglobin: 13.3 g/dL (ref 12.0–15.0)
Immature Granulocytes: 0 %
Lymphocytes Relative: 37 %
Lymphs Abs: 3.4 10*3/uL (ref 0.7–4.0)
MCH: 30.1 pg (ref 26.0–34.0)
MCHC: 31.6 g/dL (ref 30.0–36.0)
MCV: 95.2 fL (ref 80.0–100.0)
Monocytes Absolute: 0.5 10*3/uL (ref 0.1–1.0)
Monocytes Relative: 6 %
Neutro Abs: 5.1 10*3/uL (ref 1.7–7.7)
Neutrophils Relative %: 53 %
Platelets: 307 10*3/uL (ref 150–400)
RBC: 4.42 MIL/uL (ref 3.87–5.11)
RDW: 12.1 % (ref 11.5–15.5)
WBC: 9.3 10*3/uL (ref 4.0–10.5)
nRBC: 0 % (ref 0.0–0.2)

## 2018-07-28 LAB — COMPREHENSIVE METABOLIC PANEL
ALT: 11 U/L (ref 0–44)
AST: 16 U/L (ref 15–41)
Albumin: 3.9 g/dL (ref 3.5–5.0)
Alkaline Phosphatase: 18 U/L — ABNORMAL LOW (ref 38–126)
Anion gap: 9 (ref 5–15)
BUN: 10 mg/dL (ref 8–23)
CO2: 25 mmol/L (ref 22–32)
Calcium: 9 mg/dL (ref 8.9–10.3)
Chloride: 105 mmol/L (ref 98–111)
Creatinine, Ser: 0.89 mg/dL (ref 0.44–1.00)
GFR calc Af Amer: >60 mL/min (ref >60)
GFR calc non Af Amer: 60 mL/min — ABNORMAL LOW (ref >60)
Glucose, Bld: 126 mg/dL — ABNORMAL HIGH (ref 70–99)
Potassium: 3.4 mmol/L — ABNORMAL LOW (ref 3.5–5.1)
Sodium: 139 mmol/L (ref 135–145)
Total Bilirubin: 0.6 mg/dL (ref 0.3–1.2)
Total Protein: 7.5 g/dL (ref 6.5–8.1)

## 2018-07-28 MED ORDER — SODIUM CHLORIDE 0.9% FLUSH
10.0000 mL | Freq: Once | INTRAVENOUS | Status: AC
  Administered 2018-07-28: 15:00:00 10 mL via INTRAVENOUS

## 2018-07-28 MED ORDER — HEPARIN SOD (PORK) LOCK FLUSH 100 UNIT/ML IV SOLN
500.0000 [IU] | Freq: Once | INTRAVENOUS | Status: AC
  Administered 2018-07-28: 500 [IU] via INTRAVENOUS

## 2018-07-28 NOTE — Progress Notes (Signed)
Patients port flushed without difficulty.  No blood return noted before and after flush. No complaints of pain with flush and no bruising or swelling noted at site.  Band aid applied.  VSS with discharge and left ambulatory with no s/s of distress noted.

## 2018-07-30 MED ORDER — OCTREOTIDE ACETATE 30 MG IM KIT
PACK | INTRAMUSCULAR | Status: AC
  Filled 2018-07-30: qty 1

## 2018-08-04 ENCOUNTER — Other Ambulatory Visit: Payer: Self-pay

## 2018-08-04 ENCOUNTER — Inpatient Hospital Stay (HOSPITAL_BASED_OUTPATIENT_CLINIC_OR_DEPARTMENT_OTHER): Payer: Medicare Other | Admitting: Hematology

## 2018-08-04 ENCOUNTER — Encounter (HOSPITAL_COMMUNITY): Payer: Self-pay | Admitting: Hematology

## 2018-08-04 VITALS — BP 142/73 | HR 102 | Temp 98.8°F | Resp 18 | Wt 119.2 lb

## 2018-08-04 DIAGNOSIS — Z1239 Encounter for other screening for malignant neoplasm of breast: Secondary | ICD-10-CM

## 2018-08-04 DIAGNOSIS — C50312 Malignant neoplasm of lower-inner quadrant of left female breast: Secondary | ICD-10-CM

## 2018-08-04 DIAGNOSIS — Z853 Personal history of malignant neoplasm of breast: Secondary | ICD-10-CM

## 2018-08-04 DIAGNOSIS — Z452 Encounter for adjustment and management of vascular access device: Secondary | ICD-10-CM | POA: Diagnosis not present

## 2018-08-04 NOTE — Assessment & Plan Note (Signed)
1.  Stage Ia (PT1CNX) left breast IDC, triple receptor negative: - Left simple mastectomy by Dr. Arnoldo Morale on 01/16/2017 showing a 1.8 cm primary tumor, grade 3, LVI positive, clear surgical margins, with high-grade DCIS.  Lymph nodes cannot be removed due to prior history of left breast cancer treatment. - PET scan on 03/27/2017 showed mildly hypermetabolic small prevascular lymph node and right lower paratracheal lymph node indeterminate.  Moderate metabolic activity associated with ill-defined left upper lobe nodule. -PET scan on 08/15/2017 shows mild reduction in size and activity of the left and left upper lobe pulmonary nodules, favoring benign etiology.  Stable small prevascular lymph nodes and reduced size and activity of the lower paratracheal lymph node.  Overall suggests reactive/inflammatory etiology. - Adjuvant chemotherapy was not recommended as more than 7 months have elapsed from her surgery. - Left mastectomy site is within normal limits.  She has mild tenderness at the mastectomy site which is stable. - Patient wants to have Port-A-Cath removed as it is not giving blood return.  Will make referral to Dr. Arnoldo Morale. - Right breast mammogram on 08/26/2017 was BI-RADS Category 1. - We reviewed her blood work from today which was grossly normal. - We will see her back in 6 months for follow-up.  I will arrange for mammogram to percent of August.  2.  History of node positive left breast cancer: -Diagnosed on 02/16/2001, T1 cN1 M0, stage IIa, ER/PR positive, HER-2 negative, 1.5 cm, 1 out of 6 lymph nodes positive.

## 2018-08-04 NOTE — Progress Notes (Signed)
[address], [place] [address]   CLINIC:  Medical Oncology/Hematology  PCP:  Redmond School, [address] [place] [address] [phone]   REASON FOR VISIT:  Follow-up for breast cancer, ER-/PR-/HER2-  CURRENT THERAPY: observation  BRIEF ONCOLOGIC HISTORY:  Oncology History  Malignant neoplasm of lower-inner quadrant of left female breast ([place])  08/21/2016 Pathology Results   Left needle core biopsy, lower-inner quadrant: invasive ductal carcinoma and DCIS.  ER/PR-, HER2-.    01/16/2017 Surgery   Left simple mastectomy by Dr. Alean Rinne ductal carcinoma, grade 3, measuring 1.8 cm with DCIS (high-grade).  Lymphovascular invasion is identified.  Clear surgical margins.      CANCER STAGING: Cancer Staging History of left breast cancer (2003) Staging form: Breast, AJCC 7th Edition - Clinical: Stage IIA (T1c, N1, cM0) - Signed by Baird Cancer, PA-C on 11/23/2012  Malignant neoplasm of lower-inner quadrant of left female breast [facility]) Staging form: Breast, AJCC 8th Edition - Pathologic stage from 01/19/2017: Stage Unknown (pT1c, pNX, cM0, G3, ER: Negative, PR: Negative, HER2: Negative) - Signed by Baird Cancer, PA-C on 02/27/2017    INTERVAL HISTORY:  Ms. Leisey 85 y.o. female seen for follow-up of left breast cancer.  Mild tenderness in the axillary region as well as mastectomy site which is stable.  No pain in the right breast noted.  Denies any other new onset pains.  Denies any fevers, night sweats or weight loss in the last 6 months.  No nausea, vomiting, diarrhea or constipation.  No ER visits or hospitalizations.  She wants to have the Port-A-Cath taken out.  Appetite is 50%.  Energy levels are 50%.    REVIEW OF SYSTEMS:  Review of Systems  All other systems reviewed and are negative.    PAST MEDICAL/SURGICAL HISTORY:  Past Medical History:  Diagnosis Date  . Acute MI ([place]) 2003  . Adenocarcinoma of  left breast ([place]) 11/23/2012   Stage II (T1 C. N1 M0) adenocarcinoma of the left breast with surgery on 03/08/2001. She had a 1.5 cm cancer with 1/6 positive sentinel nodes. Reexcision margins were clear. She did participate in NSABP B.-30 randomized to Adriamycin and Taxotere for 4 cycles followed by radiation therapy from 07/13/2001 to 08/27/2001. She then started Arimidex on 07/07/2001 took that until the end of June 2008. T  . Anemia   . Bilateral cataracts   . Breast cancer ([place])   . GERD (gastroesophageal reflux disease)   . History of gout   . Hypercholesteremia   . Hypertension   . Malignant neoplasm of lower-inner quadrant of left female breast ([place])   . Osteoporosis    Past Surgical History:  Procedure Laterality Date  . ABDOMINAL HYSTERECTOMY    . BREAST SURGERY    . CATARACT EXTRACTION W/PHACO Left 09/04/2014   Procedure: CATARACT EXTRACTION PHACO AND INTRAOCULAR LENS PLACEMENT (IOC);  Surgeon: Tonny Branch, MD;  Location: [facility];  Service: Ophthalmology;  Laterality: Left;  CDE:9.84  . CATARACT EXTRACTION W/PHACO Right 11/09/2014   Procedure: CATARACT EXTRACTION PHACO AND INTRAOCULAR LENS PLACEMENT (IOC);  Surgeon: Tonny Branch, MD;  Location: [facility];  Service: Ophthalmology;  Laterality: Right;  CDE:8.40  . CORONARY ANGIOPLASTY     RCA stent  . ESOPHAGOGASTRODUODENOSCOPY (EGD) WITH PROPOFOL N/A 08/04/2017   Procedure: ESOPHAGOGASTRODUODENOSCOPY (EGD) WITH PROPOFOL;  Surgeon: Danie Binder, MD;  [facility];  Service: Endoscopy;  Laterality: N/A;  7:30am  . LEFT HEART CATHETERIZATION WITH CORONARY ANGIOGRAM  N/A 12/21/2012   Procedure: LEFT HEART CATHETERIZATION WITH CORONARY ANGIOGRAM;  Surgeon: Clent Demark, MD;  Location: [facility] CATH LAB;  Service: Cardiovascular;  Laterality: N/A;  . MASTECTOMY MODIFIED RADICAL Left 01/16/2017   Procedure: SIMPLE MASTECTOMY;  Surgeon: Aviva Signs, MD;  Location: [facility];  Service: General;  Laterality: Left;  Marland Kitchen MASTECTOMY, PARTIAL  Left   . PERCUTANEOUS STENT INTERVENTION     RCA  . PORT-A-CATH REMOVAL    . PORTACATH PLACEMENT    . PORTACATH PLACEMENT Right 03/11/2017   Procedure: INSERTION PORT-A-CATH;  Surgeon: Aviva Signs, MD;  Location: [facility];  Service: General;  Laterality: Right;  . SAVORY DILATION N/A 08/04/2017   Procedure: SAVORY DILATION;  Surgeon: Danie Binder, MD;  [facility];  Service: Endoscopy;  Laterality: N/A;     SOCIAL HISTORY:  Social History   Socioeconomic History  . Marital status: Widowed    Spouse name: Not on file  . Number of children: Not on file  . Years of education: Not on file  . Highest education level: Not on file  Occupational History  . Not on file  Social Needs  . Financial resource strain: Not on file  . Food insecurity    Worry: Not on file    Inability: Not on file  . Transportation needs    Medical: Not on file    Non-medical: Not on file  Tobacco Use  . Smoking status: Current Every Day Smoker    Packs/day: 0.50    Years: 50.00    Pack years: 25.00    Types: Cigarettes  . Smokeless tobacco: Never Used  . Tobacco comment: 10 cigarettes daily  Substance and Sexual Activity  . Alcohol use: No  . Drug use: No  . Sexual activity: Not Currently    Birth control/protection: Surgical  Lifestyle  . Physical activity    Days per week: Not on file    Minutes per session: Not on file  . Stress: Not on file  Relationships  . Social Herbalist on phone: Not on file    Gets together: Not on file    Attends religious service: Not on file    Active member of club or organization: Not on file    Attends meetings of clubs or organizations: Not on file    Relationship status: Not on file  . Intimate partner violence    Fear of current or ex partner: Not on file    Emotionally abused: Not on file    Physically abused: Not on file    Forced sexual activity: Not on file  Other Topics Concern  . Not on file  Social History Narrative  .  Not on file    FAMILY HISTORY:  Family History  Problem Relation Age of Onset  . Stroke Mother   . Colon cancer Neg Hx   . Gastric cancer Neg Hx   . Esophageal cancer Neg Hx     CURRENT MEDICATIONS:  Outpatient Encounter Medications as of 08/04/2018  Medication Sig Note  . Artificial Tear Solution (SOOTHE XP OP) Place 1-2 drops into both eyes daily as needed (dry eye).    Marland Kitchen aspirin EC 81 MG tablet Take 81 mg by mouth daily.   Marland Kitchen atorvastatin (LIPITOR) 40 MG tablet Take 40 mg by mouth every evening.    Marland Kitchen CARTIA XT 180 MG 24 hr capsule Take 180 mg by mouth daily.   . Cyanocobalamin (B-12 COMPLIANCE INJECTION) 1000  MCG/ML KIT Inject as directed every 30 (thirty) days.  09/08/2017: Received on 09/07/2017  . pantoprazole (PROTONIX) 40 MG tablet TAKE ONE TABLET (40MG TOTAL) BY MOUTH DAILY (Patient taking differently: Take 40 mg by mouth as needed. )   . [DISCONTINUED] ranitidine (ZANTAC) 150 MG tablet Take 75 mg by mouth daily as needed for heartburn.     No facility-administered encounter medications on file as of 08/04/2018.     ALLERGIES:  Allergies  Allergen Reactions  . Azithromycin Hives     PHYSICAL EXAM:  ECOG Performance status: 1  Vitals:   08/04/18 1442  BP: (!) 142/73  Pulse: (!) 102  Resp: 18  Temp: 98.8 F (37.1 C)  SpO2: 98%   Filed Weights   08/04/18 1442  Weight: 119 lb 3.2 oz (54.1 kg)    Physical Exam Constitutional:      Appearance: Normal appearance.  Cardiovascular:     Rate and Rhythm: Normal rate and regular rhythm.     Heart sounds: Normal heart sounds.  Pulmonary:     Effort: Pulmonary effort is normal.     Breath sounds: Normal breath sounds.  Abdominal:     General: There is no distension.     Palpations: Abdomen is soft. There is no mass.  Musculoskeletal:        General: No swelling.  Lymphadenopathy:     Cervical: No cervical adenopathy.  Skin:    General: Skin is warm.  Neurological:     Mental Status: She is alert and oriented  to person, place, and time.  Psychiatric:        Mood and Affect: Mood normal.        Behavior: Behavior normal.   Left mastectomy site is within normal limits.  Right breast has no palpable masses.  No palpable adenopathy.   LABORATORY DATA:  I have reviewed the labs as listed.  CBC    Component Value Date/Time   WBC 9.3 07/28/2018 1514   RBC 4.42 07/28/2018 1514   HGB 13.3 07/28/2018 1514   HCT 42.1 07/28/2018 1514   PLT 307 07/28/2018 1514   MCV 95.2 07/28/2018 1514   MCH 30.1 07/28/2018 1514   MCHC 31.6 07/28/2018 1514   RDW 12.1 07/28/2018 1514   LYMPHSABS 3.4 07/28/2018 1514   MONOABS 0.5 07/28/2018 1514   EOSABS 0.2 07/28/2018 1514   BASOSABS 0.1 07/28/2018 1514   CMP Latest Ref Rng & Units 07/28/2018 01/20/2018 09/08/2017  Glucose 70 - 99 mg/dL 126(H) 92 103(H)  BUN 8 - 23 mg/dL 10 9 7(L)  Creatinine 0.44 - 1.00 mg/dL 0.89 0.93 0.84  Sodium 135 - 145 mmol/L 139 140 141  Potassium 3.5 - 5.1 mmol/L 3.4(L) 4.0 3.6  Chloride 98 - 111 mmol/L 105 105 108  CO2 22 - 32 mmol/L 25 24 25   Calcium 8.9 - 10.3 mg/dL 9.0 9.0 9.5  Total Protein 6.5 - 8.1 g/dL 7.5 7.6 8.2(H)  Total Bilirubin 0.3 - 1.2 mg/dL 0.6 0.6 0.6  Alkaline Phos 38 - 126 U/L 18(L) 77 82  AST 15 - 41 U/L 16 17 17   ALT 0 - 44 U/L 11 14 12      I have independently reviewed the scans and discussed with the patient.      ASSESSMENT & PLAN:   Malignant neoplasm of lower-inner quadrant of left female breast ([place]) 1.  Stage Ia (PT1CNX) left breast IDC, triple receptor negative: - Left simple mastectomy by Dr. Arnoldo Morale on 01/16/2017 showing a 1.8  cm primary tumor, grade 3, LVI positive, clear surgical margins, with high-grade DCIS.  Lymph nodes cannot be removed due to prior history of left breast cancer treatment. - PET scan on 03/27/2017 showed mildly hypermetabolic small prevascular lymph node and right lower paratracheal lymph node indeterminate.  Moderate metabolic activity associated with ill-defined left  upper lobe nodule. -PET scan on 08/15/2017 shows mild reduction in size and activity of the left and left upper lobe pulmonary nodules, favoring benign etiology.  Stable small prevascular lymph nodes and reduced size and activity of the lower paratracheal lymph node.  Overall suggests reactive/inflammatory etiology. - Adjuvant chemotherapy was not recommended as more than 7 months have elapsed from her surgery. - Left mastectomy site is within normal limits.  She has mild tenderness at the mastectomy site which is stable. - Patient wants to have Port-A-Cath removed as it is not giving blood return.  Will make referral to Dr. Arnoldo Morale. - Right breast mammogram on 08/26/2017 was BI-RADS Category 1. - We reviewed her blood work from today which was grossly normal. - We will see her back in 6 months for follow-up.  I will arrange for mammogram to percent of August.  2.  History of node positive left breast cancer: -Diagnosed on 02/16/2001, T1 cN1 M0, stage IIa, ER/PR positive, HER-2 negative, 1.5 cm, 1 out of 6 lymph nodes positive.  Total time spent is 25 minutes with more than 50% of the time spent face-to-face discussing prior scan results, counseling and coordination of care.    Orders placed this encounter:  Orders Placed This Encounter  Procedures  . MM 3D SCREEN BREAST UNI RIGHT      Derek Jack, MD [place] [phone]

## 2018-08-04 NOTE — Patient Instructions (Addendum)
[facility] at [facility] Discharge Instructions  You were seen today by Dr. Katragadda. He went over your recent lab results. He will see you back in 6 months for labs and follow up.   Thank you for choosing [facility] at [facility] to provide your oncology and hematology care.  To afford each patient quality time with our provider, please arrive at least 15 minutes before your scheduled appointment time.   If you have a lab appointment with the Cancer Center please come in thru the  Main Entrance and check in at the main information desk  You need to re-schedule your appointment should you arrive 10 or more minutes late.  We strive to give you quality time with our providers, and arriving late affects you and other patients whose appointments are after yours.  Also, if you no show three or more times for appointments you may be dismissed from the clinic at the providers discretion.     Again, thank you for choosing  Cancer Center.  Our hope is that these requests will decrease the amount of time that you wait before being seen by our physicians.       _____________________________________________________________  Should you have questions after your visit to  Cancer Center, please contact our office at [phone] between the hours of 8:00 a.m. and 4:30 p.m.  Voicemails left after 4:00 p.m. will not be returned until the following business day.  For prescription refill requests, have your pharmacy contact our office and allow 72 hours.    Cancer Center Support Programs:   > Cancer Support Group  2nd Tuesday of the month 1pm-2pm, Journey Room    

## 2018-08-30 ENCOUNTER — Other Ambulatory Visit: Payer: Self-pay

## 2018-08-30 ENCOUNTER — Ambulatory Visit (HOSPITAL_COMMUNITY)
Admission: RE | Admit: 2018-08-30 | Discharge: 2018-08-30 | Disposition: A | Discharge status: Home / Self Care | Payer: Medicare Other | Source: Ambulatory Visit | Attending: Hematology | Admitting: Hematology

## 2018-08-30 DIAGNOSIS — Z1239 Encounter for other screening for malignant neoplasm of breast: Secondary | ICD-10-CM | POA: Diagnosis present

## 2018-08-30 DIAGNOSIS — Z1231 Encounter for screening mammogram for malignant neoplasm of breast: Secondary | ICD-10-CM | POA: Insufficient documentation

## 2018-09-07 ENCOUNTER — Encounter: Payer: Self-pay | Admitting: General Surgery

## 2018-09-07 ENCOUNTER — Ambulatory Visit (INDEPENDENT_AMBULATORY_CARE_PROVIDER_SITE_OTHER): Payer: Medicare Other | Admitting: General Surgery

## 2018-09-07 ENCOUNTER — Other Ambulatory Visit: Payer: Self-pay

## 2018-09-07 VITALS — BP 137/70 | HR 64 | Temp 96.8°F | Resp 18 | Ht 62.0 in | Wt 118.0 lb

## 2018-09-07 DIAGNOSIS — C50312 Malignant neoplasm of lower-inner quadrant of left female breast: Secondary | ICD-10-CM

## 2018-09-07 NOTE — Progress Notes (Signed)
Margaret Thompson; [id number]; 1933-10-12   HPI Patient is an 85 year old white female who was referred to my care by Dr. Delton Coombes of oncology for Port-A-Cath removal.  She has finished her chemotherapy treatment for left breast cancer.  She currently has minimal pain along the left lateral aspect of her left mastectomy scar.  This has been worked up and only shows mild inflammatory changes.  No infection is present. Past Medical History:  Diagnosis Date  . Acute MI ([place]) 2003  . Adenocarcinoma of left breast ([place]) 11/23/2012   Stage II (T1 C. N1 M0) adenocarcinoma of the left breast with surgery on 03/08/2001. She had a 1.5 cm cancer with 1/6 positive sentinel nodes. Reexcision margins were clear. She did participate in NSABP B.-30 randomized to Adriamycin and Taxotere for 4 cycles followed by radiation therapy from 07/13/2001 to 08/27/2001. She then started Arimidex on 07/07/2001 took that until the end of June 2008. T  . Anemia   . Bilateral cataracts   . Breast cancer ([place])   . GERD (gastroesophageal reflux disease)   . History of gout   . Hypercholesteremia   . Hypertension   . Malignant neoplasm of lower-inner quadrant of left female breast ([place])   . Osteoporosis     Past Surgical History:  Procedure Laterality Date  . ABDOMINAL HYSTERECTOMY    . BREAST SURGERY    . CATARACT EXTRACTION W/PHACO Left 09/04/2014   Procedure: CATARACT EXTRACTION PHACO AND INTRAOCULAR LENS PLACEMENT (IOC);  Surgeon: Tonny Branch, MD;  Location: [facility];  Service: Ophthalmology;  Laterality: Left;  CDE:9.84  . CATARACT EXTRACTION W/PHACO Right 11/09/2014   Procedure: CATARACT EXTRACTION PHACO AND INTRAOCULAR LENS PLACEMENT (IOC);  Surgeon: Tonny Branch, MD;  Location: [facility];  Service: Ophthalmology;  Laterality: Right;  CDE:8.40  . CORONARY ANGIOPLASTY     RCA stent  . ESOPHAGOGASTRODUODENOSCOPY (EGD) WITH PROPOFOL N/A 08/04/2017   Procedure: ESOPHAGOGASTRODUODENOSCOPY (EGD) WITH PROPOFOL;  Surgeon: Danie Binder, MD;  [facility];  Service: Endoscopy;  Laterality: N/A;  7:30am  . LEFT HEART CATHETERIZATION WITH CORONARY ANGIOGRAM N/A 12/21/2012   Procedure: LEFT HEART CATHETERIZATION WITH CORONARY ANGIOGRAM;  Surgeon: Clent Demark, MD;  Location: [facility];  Service: Cardiovascular;  Laterality: N/A;  . MASTECTOMY MODIFIED RADICAL Left 01/16/2017   Procedure: SIMPLE MASTECTOMY;  Surgeon: Aviva Signs, MD;  Location: [facility];  Service: General;  Laterality: Left;  Marland Kitchen MASTECTOMY, PARTIAL Left   . PERCUTANEOUS STENT INTERVENTION     RCA  . PORT-A-CATH REMOVAL    . PORTACATH PLACEMENT    . PORTACATH PLACEMENT Right 03/11/2017   Procedure: INSERTION PORT-A-CATH;  Surgeon: Aviva Signs, MD;  Location: [facility];  Service: General;  Laterality: Right;  . SAVORY DILATION N/A 08/04/2017   Procedure: SAVORY DILATION;  Surgeon: Danie Binder, MD;  [facility];  Service: Endoscopy;  Laterality: N/A;    Family History  Problem Relation Age of Onset  . Stroke Mother   . Colon cancer Neg Hx   . Gastric cancer Neg Hx   . Esophageal cancer Neg Hx     Current Outpatient Medications on File Prior to Visit  Medication Sig Dispense Refill  . Artificial Tear Solution (SOOTHE XP OP) Place 1-2 drops into both eyes daily as needed (dry eye).     Marland Kitchen aspirin EC 81 MG tablet Take 81 mg by mouth daily.    Marland Kitchen atorvastatin (LIPITOR) 40 MG tablet Take 40 mg by mouth every evening.     Marland Kitchen  CARTIA XT 180 MG 24 hr capsule Take 180 mg by mouth daily.    . Cyanocobalamin (B-12 COMPLIANCE INJECTION) 1000 MCG/ML KIT Inject as directed every 30 (thirty) days.     . pantoprazole (PROTONIX) 40 MG tablet TAKE ONE TABLET (40MG TOTAL) BY MOUTH DAILY (Patient taking differently: Take 40 mg by mouth as needed. ) 90 tablet 3   No current facility-administered medications on file prior to visit.     Allergies  Allergen Reactions  . Azithromycin Hives    Social History   Substance and Sexual Activity   Alcohol Use No    Social History   Tobacco Use  Smoking Status Current Every Day Smoker  . Packs/day: 0.50  . Years: 50.00  . Pack years: 25.00  . Types: Cigarettes  Smokeless Tobacco Never Used  Tobacco Comment   10 cigarettes daily    Review of Systems  Constitutional: Negative.   HENT: Negative.   Eyes: Negative.   Respiratory: Negative.   Cardiovascular: Negative.   Gastrointestinal: Negative.   Genitourinary: Negative.   Musculoskeletal: Negative.   Skin: Negative.   Neurological: Negative.   Endo/Heme/Allergies: Negative.   Psychiatric/Behavioral: Negative.     Objective   Vitals:   09/07/18 1002  BP: 137/70  Pulse: 64  Resp: 18  Temp: (!) 96.8 F (36 C)  SpO2: 95%    Physical Exam Vitals signs reviewed.  Constitutional:      Appearance: Normal appearance. She is normal weight. She is not ill-appearing.  HENT:     Head: Normocephalic and atraumatic.  Cardiovascular:     Rate and Rhythm: Normal rate and regular rhythm.     Heart sounds: Normal heart sounds. No murmur. No friction rub. No gallop.   Pulmonary:     Effort: Pulmonary effort is normal. No respiratory distress.     Breath sounds: Normal breath sounds. No stridor. No wheezing, rhonchi or rales.     Comments: Port-A-Cath in place right upper chest. Skin:    General: Skin is warm and dry.  Neurological:     Mental Status: She is alert and oriented to person, place, and time.     Assessment  Left breast carcinoma, finished with chemotherapy Plan   Patient is scheduled for Port-A-Cath removal in the minor procedure room on 09/13/2018.  The risks and benefits of the procedure were fully explained to the patient, who gave informed consent.

## 2018-09-07 NOTE — H&P (Signed)
Margaret Thompson; [id number]; 07/16/1933   HPI Patient is an 85-year-old white female who was referred to my care by Dr. Katragadda of oncology for Port-A-Cath removal.  She has finished her chemotherapy treatment for left breast cancer.  She currently has minimal pain along the left lateral aspect of her left mastectomy scar.  This has been worked up and only shows mild inflammatory changes.  No infection is present. Past Medical History:  Diagnosis Date  . Acute MI ([facility]) 2003  . Adenocarcinoma of left breast ([facility]) 11/23/2012   Stage II (T1 C. N1 M0) adenocarcinoma of the left breast with surgery on 03/08/2001. She had a 1.5 cm cancer with 1/6 positive sentinel nodes. Reexcision margins were clear. She did participate in NSABP B.-30 randomized to Adriamycin and Taxotere for 4 cycles followed by radiation therapy from 07/13/2001 to 08/27/2001. She then started Arimidex on 07/07/2001 took that until the end of June 2008. T  . Anemia   . Bilateral cataracts   . Breast cancer ([facility])   . GERD (gastroesophageal reflux disease)   . History of gout   . Hypercholesteremia   . Hypertension   . Malignant neoplasm of lower-inner quadrant of left female breast ([facility])   . Osteoporosis     Past Surgical History:  Procedure Laterality Date  . ABDOMINAL HYSTERECTOMY    . BREAST SURGERY    . CATARACT EXTRACTION W/PHACO Left 09/04/2014   Procedure: CATARACT EXTRACTION PHACO AND INTRAOCULAR LENS PLACEMENT (IOC);  Surgeon: Kerry Hunt, MD;  Location: [facility];  Service: Ophthalmology;  Laterality: Left;  CDE:9.84  . CATARACT EXTRACTION W/PHACO Right 11/09/2014   Procedure: CATARACT EXTRACTION PHACO AND INTRAOCULAR LENS PLACEMENT (IOC);  Surgeon: Kerry Hunt, MD;  Location: [facility];  Service: Ophthalmology;  Laterality: Right;  CDE:8.40  . CORONARY ANGIOPLASTY     RCA stent  . ESOPHAGOGASTRODUODENOSCOPY (EGD) WITH PROPOFOL N/A 08/04/2017   Procedure: ESOPHAGOGASTRODUODENOSCOPY (EGD) WITH PROPOFOL;  Surgeon: Fields,  Sandi L, MD;  [facility];  Service: Endoscopy;  Laterality: N/A;  7:30am  . LEFT HEART CATHETERIZATION WITH CORONARY ANGIOGRAM N/A 12/21/2012   Procedure: LEFT HEART CATHETERIZATION WITH CORONARY ANGIOGRAM;  Surgeon: Mohan N Harwani, MD;  Location: [facility];  Service: Cardiovascular;  Laterality: N/A;  . MASTECTOMY MODIFIED RADICAL Left 01/16/2017   Procedure: SIMPLE MASTECTOMY;  Surgeon: Keshaun Dubey, MD;  Location: [facility];  Service: General;  Laterality: Left;  . MASTECTOMY, PARTIAL Left   . PERCUTANEOUS STENT INTERVENTION     RCA  . PORT-A-CATH REMOVAL    . PORTACATH PLACEMENT    . PORTACATH PLACEMENT Right 03/11/2017   Procedure: INSERTION PORT-A-CATH;  Surgeon: Adit Riddles, MD;  Location: [facility];  Service: General;  Laterality: Right;  . SAVORY DILATION N/A 08/04/2017   Procedure: SAVORY DILATION;  Surgeon: Fields, Sandi L, MD;  [facility];  Service: Endoscopy;  Laterality: N/A;    Family History  Problem Relation Age of Onset  . Stroke Mother   . Colon cancer Neg Hx   . Gastric cancer Neg Hx   . Esophageal cancer Neg Hx     Current Outpatient Medications on File Prior to Visit  Medication Sig Dispense Refill  . Artificial Tear Solution (SOOTHE XP OP) Place 1-2 drops into both eyes daily as needed (dry eye).     . aspirin EC 81 MG tablet Take 81 mg by mouth daily.    . atorvastatin (LIPITOR) 40 MG tablet Take 40 mg by mouth every evening.     .   CARTIA XT 180 MG 24 hr capsule Take 180 mg by mouth daily.    . Cyanocobalamin (B-12 COMPLIANCE INJECTION) 1000 MCG/ML KIT Inject as directed every 30 (thirty) days.     . pantoprazole (PROTONIX) 40 MG tablet TAKE ONE TABLET (40MG TOTAL) BY MOUTH DAILY (Patient taking differently: Take 40 mg by mouth as needed. ) 90 tablet 3   No current facility-administered medications on file prior to visit.     Allergies  Allergen Reactions  . Azithromycin Hives    Social History   Substance and Sexual Activity   Alcohol Use No    Social History   Tobacco Use  Smoking Status Current Every Day Smoker  . Packs/day: 0.50  . Years: 50.00  . Pack years: 25.00  . Types: Cigarettes  Smokeless Tobacco Never Used  Tobacco Comment   10 cigarettes daily    Review of Systems  Constitutional: Negative.   HENT: Negative.   Eyes: Negative.   Respiratory: Negative.   Cardiovascular: Negative.   Gastrointestinal: Negative.   Genitourinary: Negative.   Musculoskeletal: Negative.   Skin: Negative.   Neurological: Negative.   Endo/Heme/Allergies: Negative.   Psychiatric/Behavioral: Negative.     Objective   Vitals:   09/07/18 1002  BP: 137/70  Pulse: 64  Resp: 18  Temp: (!) 96.8 F (36 C)  SpO2: 95%    Physical Exam Vitals signs reviewed.  Constitutional:      Appearance: Normal appearance. She is normal weight. She is not ill-appearing.  HENT:     Head: Normocephalic and atraumatic.  Cardiovascular:     Rate and Rhythm: Normal rate and regular rhythm.     Heart sounds: Normal heart sounds. No murmur. No friction rub. No gallop.   Pulmonary:     Effort: Pulmonary effort is normal. No respiratory distress.     Breath sounds: Normal breath sounds. No stridor. No wheezing, rhonchi or rales.     Comments: Port-A-Cath in place right upper chest. Skin:    General: Skin is warm and dry.  Neurological:     Mental Status: She is alert and oriented to person, place, and time.     Assessment  Left breast carcinoma, finished with chemotherapy Plan   Patient is scheduled for Port-A-Cath removal in the minor procedure room on 09/13/2018.  The risks and benefits of the procedure were fully explained to the patient, who gave informed consent. 

## 2018-09-13 ENCOUNTER — Ambulatory Visit (HOSPITAL_COMMUNITY)
Admission: RE | Admit: 2018-09-13 | Discharge: 2018-09-13 | Disposition: A | Discharge status: Home / Self Care | Payer: Medicare Other | Attending: General Surgery | Admitting: General Surgery

## 2018-09-13 ENCOUNTER — Encounter (HOSPITAL_COMMUNITY)
Admission: RE | Disposition: A | Discharge status: Home / Self Care | Payer: Self-pay | Source: Home / Self Care | Attending: General Surgery

## 2018-09-13 DIAGNOSIS — E7800 Pure hypercholesterolemia, unspecified: Secondary | ICD-10-CM | POA: Insufficient documentation

## 2018-09-13 DIAGNOSIS — K219 Gastro-esophageal reflux disease without esophagitis: Secondary | ICD-10-CM | POA: Insufficient documentation

## 2018-09-13 DIAGNOSIS — C50912 Malignant neoplasm of unspecified site of left female breast: Secondary | ICD-10-CM

## 2018-09-13 DIAGNOSIS — Z7982 Long term (current) use of aspirin: Secondary | ICD-10-CM | POA: Diagnosis not present

## 2018-09-13 DIAGNOSIS — Z9012 Acquired absence of left breast and nipple: Secondary | ICD-10-CM | POA: Insufficient documentation

## 2018-09-13 DIAGNOSIS — D510 Vitamin B12 deficiency anemia due to intrinsic factor deficiency: Secondary | ICD-10-CM | POA: Diagnosis not present

## 2018-09-13 DIAGNOSIS — Z452 Encounter for adjustment and management of vascular access device: Secondary | ICD-10-CM | POA: Diagnosis not present

## 2018-09-13 DIAGNOSIS — I10 Essential (primary) hypertension: Secondary | ICD-10-CM | POA: Insufficient documentation

## 2018-09-13 DIAGNOSIS — Z853 Personal history of malignant neoplasm of breast: Secondary | ICD-10-CM | POA: Insufficient documentation

## 2018-09-13 DIAGNOSIS — Z923 Personal history of irradiation: Secondary | ICD-10-CM | POA: Insufficient documentation

## 2018-09-13 DIAGNOSIS — Z79899 Other long term (current) drug therapy: Secondary | ICD-10-CM | POA: Insufficient documentation

## 2018-09-13 DIAGNOSIS — Z9221 Personal history of antineoplastic chemotherapy: Secondary | ICD-10-CM | POA: Diagnosis not present

## 2018-09-13 DIAGNOSIS — F17210 Nicotine dependence, cigarettes, uncomplicated: Secondary | ICD-10-CM | POA: Diagnosis not present

## 2018-09-13 HISTORY — PX: PORT-A-CATH REMOVAL: SHX5289

## 2018-09-13 SURGERY — REMOVAL PORT-A-CATH
Anesthesia: LOCAL | Laterality: Right

## 2018-09-13 MED ORDER — LIDOCAINE HCL (PF) 1 % IJ SOLN
INTRAMUSCULAR | Status: AC
  Filled 2018-09-13: qty 30

## 2018-09-13 MED ORDER — CHLORHEXIDINE GLUCONATE CLOTH 2 % EX PADS: 6.0000 | MEDICATED_PAD | Freq: Once | CUTANEOUS | Status: DC

## 2018-09-13 SURGICAL SUPPLY — 16 items
ADH SKN CLS APL DERMABOND .7 (GAUZE/BANDAGES/DRESSINGS) ×1
CHLORAPREP W/TINT 10.5 ML (MISCELLANEOUS) ×2 IMPLANT
CLOTH BEACON ORANGE TIMEOUT ST (SAFETY) ×2 IMPLANT
DECANTER SPIKE VIAL GLASS SM (MISCELLANEOUS) ×2 IMPLANT
DERMABOND ADVANCED (GAUZE/BANDAGES/DRESSINGS) ×1
DERMABOND ADVANCED .7 DNX12 (GAUZE/BANDAGES/DRESSINGS) ×1 IMPLANT
DRAPE PROXIMA HALF (DRAPES) ×2 IMPLANT
ELECT REM PT RETURN 9FT ADLT (ELECTROSURGICAL) ×2
ELECTRODE REM PT RTRN 9FT ADLT (ELECTROSURGICAL) ×1 IMPLANT
GLOVE BIOGEL PI IND STRL 7.0 (GLOVE) ×1 IMPLANT
GLOVE BIOGEL PI INDICATOR 7.0 (GLOVE) ×1
GLOVE SURG SS PI 7.5 STRL IVOR (GLOVE) ×2 IMPLANT
GOWN STRL REUS W/TWL LRG LVL3 (GOWN DISPOSABLE) ×2 IMPLANT
SUT MNCRL AB 4-0 PS2 18 (SUTURE) ×2 IMPLANT
SUT VIC AB 3-0 SH 27 (SUTURE) ×2
SUT VIC AB 3-0 SH 27X BRD (SUTURE) ×1 IMPLANT

## 2018-09-13 NOTE — Discharge Instructions (Signed)
Implanted Port Removal, Care After This sheet gives you information about how to care for yourself after your procedure. Your health care provider may also give you more specific instructions. If you have problems or questions, contact your health care provider. What can I expect after the procedure? After the procedure, it is common to have:  Soreness or pain near your incision.  Some swelling or bruising near your incision. Follow these instructions at home: Medicines  Take over-the-counter and prescription medicines only as told by your health care provider.  If you were prescribed an antibiotic medicine, take it as told by your health care provider. Do not stop taking the antibiotic even if you start to feel better. Bathing  Do not take baths, swim, or use a hot tub until your health care provider approves. Ask your health care provider if you can take showers. You may only be allowed to take sponge baths. Incision care   Follow instructions from your health care provider about how to take care of your incision. Make sure you: ? Wash your hands with soap and water before you change your bandage (dressing). If soap and water are not available, use hand sanitizer. ? Change your dressing as told by your health care provider. ? Keep your dressing dry. ? Leave stitches (sutures), skin glue, or adhesive strips in place. These skin closures may need to stay in place for 2 weeks or longer. If adhesive strip edges start to loosen and curl up, you may trim the loose edges. Do not remove adhesive strips completely unless your health care provider tells you to do that.  Check your incision area every day for signs of infection. Check for: ? More redness, swelling, or pain. ? More fluid or blood. ? Warmth. ? Pus or a bad smell. Driving   Do not drive for 24 hours if you were given a medicine to help you relax (sedative) during your procedure.  If you did not receive a sedative, ask your  health care provider when it is safe to drive. Activity  Return to your normal activities as told by your health care provider. Ask your health care provider what activities are safe for you.  Do not lift anything that is heavier than 10 lb (4.5 kg), or the limit that you are told, until your health care provider says that it is safe.  Do not do activities that involve lifting your arms over your head. General instructions  Do not use any products that contain nicotine or tobacco, such as cigarettes and e-cigarettes. These can delay healing. If you need help quitting, ask your health care provider.  Keep all follow-up visits as told by your health care provider. This is important. Contact a health care provider if:  You have more redness, swelling, or pain around your incision.  You have more fluid or blood coming from your incision.  Your incision feels warm to the touch.  You have pus or a bad smell coming from your incision.  You have pain that is not relieved by your pain medicine. Get help right away if you have:  A fever or chills.  Chest pain.  Difficulty breathing. Summary  After the procedure, it is common to have pain, soreness, swelling, or bruising near your incision.  If you were prescribed an antibiotic medicine, take it as told by your health care provider. Do not stop taking the antibiotic even if you start to feel better.  Do not drive for 24 hours  if you were given a sedative during your procedure.  Return to your normal activities as told by your health care provider. Ask your health care provider what activities are safe for you. This information is not intended to replace advice given to you by your health care provider. Make sure you discuss any questions you have with your health care provider. Document Released: 12/11/2014 Document Revised: 02/12/2017 Document Reviewed: 02/12/2017 Elsevier Patient Education  2020 [employer].

## 2018-09-13 NOTE — Interval H&P Note (Signed)
History and Physical Interval Note:  09/13/2018 8:04 AM  Margaret Thompson  has presented today for surgery, with the diagnosis of left breast cancer.  The various methods of treatment have been discussed with the patient and family. After consideration of risks, benefits and other options for treatment, the patient has consented to  Procedure(s): REMOVAL PORT-A-CATH (Right) as a surgical intervention.  The patient's history has been reviewed, patient examined, no change in status, stable for surgery.  I have reviewed the patient's chart and labs.  Questions were answered to the patient's satisfaction.     Aviva Signs

## 2018-09-13 NOTE — Op Note (Signed)
Patient:  Margaret Thompson  DOB:  [birth year]  MRN:  [id number]   Preop Diagnosis: Left breast carcinoma, finished with chemotherapy  Postop Diagnosis: Same  Procedure: Port-A-Cath removal  Surgeon: Aviva Signs, MD  Anes: Local  Indications: Patient is an 85 year old white female who has finished with her chemotherapy for left breast carcinoma.  She now presents for Port-A-Cath removal in the minor procedure room.  The risks and benefits of the procedure were fully explained to the patient, who gave informed consent.  Procedure note: The patient was placed in supine position.  The right upper chest was prepped and draped using the usual sterile technique with ChloraPrep.  Surgical site confirmation was performed.  1% Xylocaine was used for local anesthesia.  An incision was made through the previous surgical incision site.  The dissection was taken down to the Port-A-Cath.  The Port-A-Cath was removed in total without difficulty.  It was disposed of.  No abnormal bleeding was noted.  The subcutaneous layer was reapproximated using a 3-0 Vicryl interrupted suture.  The skin was closed using a 4-0 Monocryl subcuticular suture.  Dermabond was applied.  All tape and needle counts were correct at the end of the procedure.  The patient was discharged from the minor procedure room in good and improving condition.  Complications: None  EBL: Minimal  Specimen: None

## 2018-09-14 ENCOUNTER — Encounter (HOSPITAL_COMMUNITY): Payer: Self-pay | Admitting: General Surgery

## 2018-09-29 DIAGNOSIS — H6121 Impacted cerumen, right ear: Secondary | ICD-10-CM | POA: Diagnosis not present

## 2018-10-25 DIAGNOSIS — D510 Vitamin B12 deficiency anemia due to intrinsic factor deficiency: Secondary | ICD-10-CM | POA: Diagnosis not present

## 2018-10-28 DIAGNOSIS — I2510 Atherosclerotic heart disease of native coronary artery without angina pectoris: Secondary | ICD-10-CM | POA: Diagnosis not present

## 2018-10-28 DIAGNOSIS — R7303 Prediabetes: Secondary | ICD-10-CM | POA: Diagnosis not present

## 2018-10-28 DIAGNOSIS — I10 Essential (primary) hypertension: Secondary | ICD-10-CM | POA: Diagnosis not present

## 2018-10-28 DIAGNOSIS — E785 Hyperlipidemia, unspecified: Secondary | ICD-10-CM | POA: Diagnosis not present

## 2018-11-10 DIAGNOSIS — N10 Acute tubulo-interstitial nephritis: Secondary | ICD-10-CM | POA: Diagnosis not present

## 2018-11-10 DIAGNOSIS — J449 Chronic obstructive pulmonary disease, unspecified: Secondary | ICD-10-CM | POA: Diagnosis not present

## 2018-11-26 DIAGNOSIS — S30860A Insect bite (nonvenomous) of lower back and pelvis, initial encounter: Secondary | ICD-10-CM | POA: Diagnosis not present

## 2018-11-30 ENCOUNTER — Ambulatory Visit: Payer: Medicare Other | Admitting: Nurse Practitioner

## 2018-11-30 ENCOUNTER — Encounter: Payer: Self-pay | Admitting: Nurse Practitioner

## 2018-11-30 ENCOUNTER — Other Ambulatory Visit: Payer: Self-pay

## 2018-11-30 VITALS — BP 132/71 | HR 109 | Temp 96.2°F | Ht 62.0 in | Wt 116.8 lb

## 2018-11-30 DIAGNOSIS — K5900 Constipation, unspecified: Secondary | ICD-10-CM | POA: Diagnosis not present

## 2018-11-30 DIAGNOSIS — K219 Gastro-esophageal reflux disease without esophagitis: Secondary | ICD-10-CM

## 2018-11-30 NOTE — Patient Instructions (Signed)
Your health issues we discussed today were:   Infrequent bowel movements: 1. Increase the amount of water you are drinking to at least 4 bottles a day 2. Start taking Colace 100 mg stool softener once a day 3. If after 2 weeks of this you are still having infrequent bowel movements you can add a fiber supplement once a day, such as fiber Gummies 4. Call us for any worsening or severe symptoms  GERD (reflux/heartburn): 1. As we discussed, take Protonix 40 mg once a day, every day, first thing in the morning on an empty stomach 2. When you have been doing this for a solid 2 weeks call our office and let us know if your reflux is any better 3. Depending on how you respond to Protonix, we may need to increase to frequency or change to a different medication 4. At your follow-up visit we will further discuss your swallowing symptoms to see if an upper endoscopy is necessary 5. Call us if you have any worsening or severe symptoms  Overall I recommend:  1. Continue your other current medications 2. Return for follow-up in 6 to 8 weeks 3. Call us if you have any questions or concerns.   Because of recent events of COVID-19 ("Coronavirus"), follow CDC recommendations:  1. Wash your hand frequently 2. Avoid touching your face 3. Stay away from people who are sick 4. If you have symptoms such as fever, cough, shortness of breath then call your healthcare provider for further guidance 5. If you are sick, STAY AT HOME unless otherwise directed by your healthcare provider. 6. Follow directions from state and national officials regarding staying safe   At [facility] we value your feedback. You may receive a survey about your visit today. Please share your experience as we strive to create trusting relationships with our patients to provide genuine, compassionate, quality care.  We appreciate your understanding and patience as we review any laboratory studies, imaging, and other  diagnostic tests that are ordered as we care for you. Our office policy is 5 business days for review of these results, and any emergent or urgent results are addressed in a timely manner for your best interest. If you do not hear from our office in 1 week, please contact us.   We also encourage the use of MyChart, which contains your medical information for your review as well. If you are not enrolled in this feature, an access code is on this after visit summary for your convenience. Thank you for allowing [place] to be involved in your care.  It was great to see you today!  I hope you have a Happy Thanksgiving!!

## 2018-11-30 NOTE — Progress Notes (Signed)
Referring Provider: Redmond School, MD Primary Care Physician:  Redmond School, MD Primary GI:  Dr. Oneida Alar  Chief Complaint  Patient presents with  . Gastroesophageal Reflux    "bad", sometimes throws up    HPI:   Margaret Thompson is a 85 y.o. female who presents for follow-up on GERD and epigastric pain.  The patient was last seen in our office 05/26/2018 for the same as well as to consider scheduling colonoscopy.  History of adenocarcinoma of the left breast currently seen by oncology.  EGD on file 2019 with moderate Schatzki's ring status post dilation, mild esophagitis, medium hiatal hernia with gastritis/duodenitis likely due to aspirin use.  Recommended Protonix indefinitely every day.  Most recent colonoscopy 06/14/2008 with recommended 10-year repeat in 2020 due to tubular adenoma polyps.  At her last visit she denied any recent GERD flares in several days, feels like it is doing better.  She is still only taking Protonix as needed.  Intermittent nausea/vomiting in the mornings after taking medications.  Declined a colonoscopy at that time.  No other overt GI complaints.  Reinforced/recommended Protonix 40 mg once a day, every day.  Continue other current medications and follow-up in 6 months.  Most recent oncology office visit dated 08/04/2018.  At that time noted no adjuvant chemotherapy due to 7 months since surgery.  Status post left mastectomy.  Port-A-Cath not giving blood return and subsequently recommended removal.  Recommended follow-up with oncology in 6 months.  Plan for mammogram in August.  Today she states she's doing ok overall. Has had worsening GERD symptoms, sometimes has vomiting. Still taking Protonix "as needed." GERD "bad" on some days, ok on others; Significant breakthrough about every other day. Has intermittent N/V with vomiting about 3-4 times a week. Feels like food causes a sensation in her throat of "pressure" type sensation which doesn't improve unless she  vomits. GERD typically occurs late in the evening and is proceeded by coughing. Most days she takes PPI in the afternoon. Typically takes about 4 times a week. Denies other abdominal pain, hematochezia, melena, fever, chills, unintentional weight loss. Denies URI or flu-like symptoms. Denies loss of sense of taste or smell. Denies chest pain, dyspnea, dizziness, lightheadedness, syncope, near syncope. Denies any other upper or lower GI symptoms.  Past Medical History:  Diagnosis Date  . Acute MI ([place]) 2003  . Adenocarcinoma of left breast ([place]) 11/23/2012   Stage II (T1 C. N1 M0) adenocarcinoma of the left breast with surgery on 03/08/2001. She had a 1.5 cm cancer with 1/6 positive sentinel nodes. Reexcision margins were clear. She did participate in NSABP B.-30 randomized to Adriamycin and Taxotere for 4 cycles followed by radiation therapy from 07/13/2001 to 08/27/2001. She then started Arimidex on 07/07/2001 took that until the end of June 2008. T  . Anemia   . Bilateral cataracts   . Breast cancer ([place])   . GERD (gastroesophageal reflux disease)   . History of gout   . Hypercholesteremia   . Hypertension   . Malignant neoplasm of lower-inner quadrant of left female breast ([place])   . Osteoporosis     Past Surgical History:  Procedure Laterality Date  . ABDOMINAL HYSTERECTOMY    . BREAST SURGERY    . CATARACT EXTRACTION W/PHACO Left 09/04/2014   Procedure: CATARACT EXTRACTION PHACO AND INTRAOCULAR LENS PLACEMENT (IOC);  Surgeon: Tonny Branch, MD;  Location: [facility];  Service: Ophthalmology;  Laterality: Left;  CDE:9.84  . CATARACT EXTRACTION W/PHACO Right 11/09/2014  Procedure: CATARACT EXTRACTION PHACO AND INTRAOCULAR LENS PLACEMENT (IOC);  Surgeon: Tonny Branch, MD;  Location: [facility];  Service: Ophthalmology;  Laterality: Right;  CDE:8.40  . CORONARY ANGIOPLASTY     RCA stent  . ESOPHAGOGASTRODUODENOSCOPY (EGD) WITH PROPOFOL N/A 08/04/2017   Procedure: ESOPHAGOGASTRODUODENOSCOPY (EGD) WITH  PROPOFOL;  Surgeon: Danie Binder, MD;  [facility];  Service: Endoscopy;  Laterality: N/A;  7:30am  . LEFT HEART CATHETERIZATION WITH CORONARY ANGIOGRAM N/A 12/21/2012   Procedure: LEFT HEART CATHETERIZATION WITH CORONARY ANGIOGRAM;  Surgeon: Clent Demark, MD;  Location: [facility];  Service: Cardiovascular;  Laterality: N/A;  . MASTECTOMY MODIFIED RADICAL Left 01/16/2017   Procedure: SIMPLE MASTECTOMY;  Surgeon: Aviva Signs, MD;  Location: [facility];  Service: General;  Laterality: Left;  Marland Kitchen MASTECTOMY, PARTIAL Left   . PERCUTANEOUS STENT INTERVENTION     RCA  . PORT-A-CATH REMOVAL    . PORT-A-CATH REMOVAL Right 09/13/2018   Procedure: REMOVAL PORT-A-CATH;  Surgeon: Aviva Signs, MD;  Location: [facility];  Service: General;  Laterality: Right;  . PORTACATH PLACEMENT    . PORTACATH PLACEMENT Right 03/11/2017   Procedure: INSERTION PORT-A-CATH;  Surgeon: Aviva Signs, MD;  Location: [facility];  Service: General;  Laterality: Right;  . SAVORY DILATION N/A 08/04/2017   Procedure: SAVORY DILATION;  Surgeon: Danie Binder, MD;  [facility];  Service: Endoscopy;  Laterality: N/A;    Current Outpatient Medications  Medication Sig Dispense Refill  . Artificial Tear Solution (SOOTHE XP OP) Place 1-2 drops into both eyes daily as needed (dry eye).     Marland Kitchen aspirin EC 81 MG tablet Take 81 mg by mouth daily.    Marland Kitchen atorvastatin (LIPITOR) 40 MG tablet Take 40 mg by mouth daily.     Marland Kitchen CARTIA XT 180 MG 24 hr capsule Take 180 mg by mouth daily.    . Cyanocobalamin (B-12 COMPLIANCE INJECTION) 1000 MCG/ML KIT Inject 1 Dose as directed every 30 (thirty) days.     . pantoprazole (PROTONIX) 40 MG tablet TAKE ONE TABLET ('40MG'$  TOTAL) BY MOUTH DAILY (Patient taking differently: Take 40 mg by mouth daily at 6 PM. ) 90 tablet 3   No current facility-administered medications for this visit.     Allergies as of 11/30/2018 - Review Complete 11/30/2018  Allergen Reaction Noted  . Azithromycin Hives  09/15/2010    Family History  Problem Relation Age of Onset  . Stroke Mother   . Colon cancer Neg Hx   . Gastric cancer Neg Hx   . Esophageal cancer Neg Hx     Social History   Socioeconomic History  . Marital status: Widowed    Spouse name: Not on file  . Number of children: Not on file  . Years of education: Not on file  . Highest education level: Not on file  Occupational History  . Not on file  Social Needs  . Financial resource strain: Not on file  . Food insecurity    Worry: Not on file    Inability: Not on file  . Transportation needs    Medical: Not on file    Non-medical: Not on file  Tobacco Use  . Smoking status: Current Every Day Smoker    Packs/day: 0.50    Years: 50.00    Pack years: 25.00    Types: Cigarettes  . Smokeless tobacco: Never Used  . Tobacco comment: 10 cigarettes daily  Substance and Sexual Activity  . Alcohol use: No  .  Drug use: No  . Sexual activity: Not Currently    Birth control/protection: Surgical  Lifestyle  . Physical activity    Days per week: Not on file    Minutes per session: Not on file  . Stress: Not on file  Relationships  . Social Herbalist on phone: Not on file    Gets together: Not on file    Attends religious service: Not on file    Active member of club or organization: Not on file    Attends meetings of clubs or organizations: Not on file    Relationship status: Not on file  Other Topics Concern  . Not on file  Social History Narrative  . Not on file    Review of Systems: General: Negative for anorexia, weight loss, fever, chills, fatigue, weakness.  ENT: Negative for hoarseness, difficulty swallowing. CV: Negative for chest pain, angina, palpitations, peripheral edema.  Respiratory: Negative for dyspnea at rest, cough, sputum, wheezing.  GI: See history of present illness. Endo: Negative for unusual weight change.  Heme: Negative for bruising or bleeding. Allergy: Negative for rash or  hives.   Physical Exam: BP 132/71   Pulse (!) 109   Temp (!) 96.2 F (35.7 C) (Temporal)   Ht 5' 2"  (1.575 m)   Wt 116 lb 12.8 oz (53 kg)   BMI 21.36 kg/m  General:   Alert and oriented. Pleasant and cooperative. Well-nourished and well-developed.  Eyes:  Without icterus, sclera clear and conjunctiva pink.  Ears:  Normal auditory acuity. Cardiovascular:  S1, S2 present without murmurs appreciated. Extremities without clubbing or edema. Respiratory:  Clear to auscultation bilaterally. No wheezes, rales, or rhonchi. No distress.  Gastrointestinal:  +BS, soft, non-tender and non-distended. No HSM noted. No guarding or rebound. No masses appreciated.  Rectal:  Deferred  Musculoskalatal:  Symmetrical without gross deformities. Neurologic:  Alert and oriented x4;  grossly normal neurologically. Psych:  Alert and cooperative. Normal mood and affect. Heme/Lymph/Immune: No excessive bruising noted.    11/30/2018 3:09 PM   Disclaimer: This note was dictated with voice recognition software. Similar sounding words can inadvertently be transcribed and may not be corrected upon review.

## 2018-11-30 NOTE — Assessment & Plan Note (Signed)
Worsening GERD symptoms.  She is still not taking her PPI daily and typically has been taking it in the afternoon.  I discussed the need to take Protonix 40 mg once a day, every day.  Take this first thing in the morning on an empty stomach.  Call us in 2 weeks when she has been doing this habitually.  If no improvement we can consider increasing her dosage to twice daily or switch to another PPI.  Follow-up in 6 to 8 weeks.  She does have some vague upper esophageal/lower throat symptoms possibly indicative of intermittent dysphagia with regurgitation.  However, it is difficult to discern and may be simply due to persisting reflux.  Once we have her reflux under better control she is still having symptoms we can consider EGD with possible dilation.

## 2018-11-30 NOTE — Assessment & Plan Note (Signed)
The patient describes some symptoms reminiscent of constipation including infrequent bowel movements about once a week.  Denies straining.  Is not sure if her stools are hard or not.  Minimal fiber intake, not enough water intake.  At this point I will trial her on over-the-counter bowel regimen including increase daily water, Colace 100 mg daily.  If no improvement after 2 weeks she can add a fiber supplement once a day to see if this helps.  Call with any problems, follow-up in 6 to 8 weeks.

## 2018-12-01 ENCOUNTER — Ambulatory Visit: Payer: Medicare Other | Admitting: Nurse Practitioner

## 2018-12-13 DIAGNOSIS — E7849 Other hyperlipidemia: Secondary | ICD-10-CM | POA: Diagnosis not present

## 2018-12-13 DIAGNOSIS — I2510 Atherosclerotic heart disease of native coronary artery without angina pectoris: Secondary | ICD-10-CM | POA: Diagnosis not present

## 2018-12-13 DIAGNOSIS — N1831 Chronic kidney disease, stage 3a: Secondary | ICD-10-CM | POA: Diagnosis not present

## 2018-12-13 DIAGNOSIS — I129 Hypertensive chronic kidney disease with stage 1 through stage 4 chronic kidney disease, or unspecified chronic kidney disease: Secondary | ICD-10-CM | POA: Diagnosis not present

## 2018-12-17 DIAGNOSIS — R5383 Other fatigue: Secondary | ICD-10-CM | POA: Diagnosis not present

## 2018-12-17 DIAGNOSIS — M545 Low back pain: Secondary | ICD-10-CM | POA: Diagnosis not present

## 2018-12-17 DIAGNOSIS — E538 Deficiency of other specified B group vitamins: Secondary | ICD-10-CM | POA: Diagnosis not present

## 2018-12-17 DIAGNOSIS — N390 Urinary tract infection, site not specified: Secondary | ICD-10-CM | POA: Diagnosis not present

## 2018-12-23 DIAGNOSIS — H61001 Unspecified perichondritis of right external ear: Secondary | ICD-10-CM | POA: Insufficient documentation

## 2019-01-13 DIAGNOSIS — E7849 Other hyperlipidemia: Secondary | ICD-10-CM | POA: Diagnosis not present

## 2019-01-13 DIAGNOSIS — I129 Hypertensive chronic kidney disease with stage 1 through stage 4 chronic kidney disease, or unspecified chronic kidney disease: Secondary | ICD-10-CM | POA: Diagnosis not present

## 2019-01-13 DIAGNOSIS — I2510 Atherosclerotic heart disease of native coronary artery without angina pectoris: Secondary | ICD-10-CM | POA: Diagnosis not present

## 2019-01-13 DIAGNOSIS — N1831 Chronic kidney disease, stage 3a: Secondary | ICD-10-CM | POA: Diagnosis not present

## 2019-01-20 ENCOUNTER — Other Ambulatory Visit: Payer: Self-pay

## 2019-01-20 ENCOUNTER — Ambulatory Visit (INDEPENDENT_AMBULATORY_CARE_PROVIDER_SITE_OTHER): Payer: Medicare Other | Admitting: Nurse Practitioner

## 2019-01-20 ENCOUNTER — Encounter: Payer: Self-pay | Admitting: Nurse Practitioner

## 2019-01-20 DIAGNOSIS — K219 Gastro-esophageal reflux disease without esophagitis: Secondary | ICD-10-CM

## 2019-01-20 NOTE — Progress Notes (Signed)
Patient was a no show. Per staff that worked her up, she stated "I'm about to go into town. If I'm not home, someone can call me tomorrow."  Marked no show and closed.

## 2019-01-21 ENCOUNTER — Telehealth: Payer: Self-pay | Admitting: Gastroenterology

## 2019-01-21 ENCOUNTER — Encounter: Payer: Self-pay | Admitting: Gastroenterology

## 2019-01-21 NOTE — Telephone Encounter (Signed)
PATIENT WAS A NO SHOW AND LETTER SENT  °

## 2019-01-25 ENCOUNTER — Other Ambulatory Visit (HOSPITAL_COMMUNITY): Payer: Self-pay | Admitting: *Deleted

## 2019-01-25 DIAGNOSIS — C50312 Malignant neoplasm of lower-inner quadrant of left female breast: Secondary | ICD-10-CM

## 2019-01-25 DIAGNOSIS — Z171 Estrogen receptor negative status [ER-]: Secondary | ICD-10-CM

## 2019-01-25 DIAGNOSIS — C50912 Malignant neoplasm of unspecified site of left female breast: Secondary | ICD-10-CM

## 2019-01-26 ENCOUNTER — Inpatient Hospital Stay (HOSPITAL_COMMUNITY): Payer: Medicare Other | Attending: Hematology

## 2019-01-26 ENCOUNTER — Telehealth: Payer: Self-pay | Admitting: Gastroenterology

## 2019-01-26 ENCOUNTER — Inpatient Hospital Stay (HOSPITAL_COMMUNITY): Payer: Medicare Other

## 2019-01-26 ENCOUNTER — Other Ambulatory Visit: Payer: Self-pay

## 2019-01-26 DIAGNOSIS — C50912 Malignant neoplasm of unspecified site of left female breast: Secondary | ICD-10-CM | POA: Diagnosis not present

## 2019-01-26 DIAGNOSIS — C50312 Malignant neoplasm of lower-inner quadrant of left female breast: Secondary | ICD-10-CM

## 2019-01-26 DIAGNOSIS — Z171 Estrogen receptor negative status [ER-]: Secondary | ICD-10-CM

## 2019-01-26 LAB — COMPREHENSIVE METABOLIC PANEL
ALT: 11 U/L (ref 0–44)
AST: 14 U/L — ABNORMAL LOW (ref 15–41)
Albumin: 3.9 g/dL (ref 3.5–5.0)
Alkaline Phosphatase: 78 U/L (ref 38–126)
Anion gap: 8 (ref 5–15)
BUN: 10 mg/dL (ref 8–23)
CO2: 27 mmol/L (ref 22–32)
Calcium: 9.3 mg/dL (ref 8.9–10.3)
Chloride: 104 mmol/L (ref 98–111)
Creatinine, Ser: 0.79 mg/dL (ref 0.44–1.00)
GFR calc Af Amer: >60 mL/min (ref >60)
GFR calc non Af Amer: >60 mL/min (ref >60)
Glucose, Bld: 98 mg/dL (ref 70–99)
Potassium: 3.7 mmol/L (ref 3.5–5.1)
Sodium: 139 mmol/L (ref 135–145)
Total Bilirubin: 0.4 mg/dL (ref 0.3–1.2)
Total Protein: 7.6 g/dL (ref 6.5–8.1)

## 2019-01-26 LAB — CBC WITH DIFFERENTIAL/PLATELET
Abs Immature Granulocytes: 0.02 10*3/uL (ref 0.00–0.07)
Basophils Absolute: 0.1 10*3/uL (ref 0.0–0.1)
Basophils Relative: 1 %
Eosinophils Absolute: 0.2 10*3/uL (ref 0.0–0.5)
Eosinophils Relative: 2 %
HCT: 42.3 % (ref 36.0–46.0)
Hemoglobin: 13.4 g/dL (ref 12.0–15.0)
Immature Granulocytes: 0 %
Lymphocytes Relative: 44 %
Lymphs Abs: 3.6 10*3/uL (ref 0.7–4.0)
MCH: 30.6 pg (ref 26.0–34.0)
MCHC: 31.7 g/dL (ref 30.0–36.0)
MCV: 96.6 fL (ref 80.0–100.0)
Monocytes Absolute: 0.5 10*3/uL (ref 0.1–1.0)
Monocytes Relative: 6 %
Neutro Abs: 3.9 10*3/uL (ref 1.7–7.7)
Neutrophils Relative %: 47 %
Platelets: 319 10*3/uL (ref 150–400)
RBC: 4.38 MIL/uL (ref 3.87–5.11)
RDW: 12.1 % (ref 11.5–15.5)
WBC: 8.2 10*3/uL (ref 4.0–10.5)
nRBC: 0 % (ref 0.0–0.2)

## 2019-01-26 NOTE — Telephone Encounter (Signed)
Pt just needs to be rescheduled at this point.

## 2019-01-26 NOTE — Telephone Encounter (Signed)
I spoke to pt and she said the provider never called her the day she had the phone visit. I told her what was in her chart, that she was headed into town and if she was not there when they called to call back the next day. I explained that is not now it works. She said she told them she was on her way into town when the nurse called her, she thought she was supposed to have a visit at the office. She said she does not have answering machine but she has called ID and can tell if anyone called her and she said no one called her to do the appointment. I told her I would forward her concern to the provider. Randall Hiss, please advise!

## 2019-01-26 NOTE — Telephone Encounter (Signed)
noted 

## 2019-01-26 NOTE — Telephone Encounter (Signed)
Pt was scheduled OV for 01/20/2019 and it was changed to a telephone visit. Pt said she received a call from the nurse and was told that the provider would be calling. She said she never did get a call from the provider and was upset that she received a NO SHOW letter. I verified her phone number and it was correct per patient. I offered to reschedule her but she would rather speak to the nurse first. She is going to have labs done later this afternoon and said to call before 2pm. She does not have VM. Please advise. [phone]

## 2019-01-26 NOTE — Telephone Encounter (Signed)
Of note, I remember getting a message from the nurse that the patient was heading into town and if she's not there someone could call her the next day. I did call the number of record, it rang about 8-10 times with no answer so I hung up. I dunno if called ID didn't show it because it was one of the other lines and so the number wasn't displayed? Just an FYI.

## 2019-01-27 DIAGNOSIS — K219 Gastro-esophageal reflux disease without esophagitis: Secondary | ICD-10-CM | POA: Diagnosis not present

## 2019-01-27 DIAGNOSIS — F17290 Nicotine dependence, other tobacco product, uncomplicated: Secondary | ICD-10-CM | POA: Diagnosis not present

## 2019-01-27 DIAGNOSIS — I2510 Atherosclerotic heart disease of native coronary artery without angina pectoris: Secondary | ICD-10-CM | POA: Diagnosis not present

## 2019-01-27 DIAGNOSIS — E785 Hyperlipidemia, unspecified: Secondary | ICD-10-CM | POA: Diagnosis not present

## 2019-01-27 DIAGNOSIS — I10 Essential (primary) hypertension: Secondary | ICD-10-CM | POA: Diagnosis not present

## 2019-01-27 DIAGNOSIS — R7303 Prediabetes: Secondary | ICD-10-CM | POA: Diagnosis not present

## 2019-01-27 NOTE — Telephone Encounter (Signed)
Called, many rings and no answer.

## 2019-01-31 NOTE — Telephone Encounter (Signed)
PT is rescheduled for 02/14/2019.

## 2019-02-02 ENCOUNTER — Ambulatory Visit (HOSPITAL_COMMUNITY): Payer: Medicare Other | Admitting: Hematology

## 2019-02-03 ENCOUNTER — Ambulatory Visit (HOSPITAL_COMMUNITY): Payer: Medicare Other | Admitting: Nurse Practitioner

## 2019-02-08 ENCOUNTER — Inpatient Hospital Stay (HOSPITAL_BASED_OUTPATIENT_CLINIC_OR_DEPARTMENT_OTHER): Payer: Medicare Other | Admitting: Nurse Practitioner

## 2019-02-08 DIAGNOSIS — Z1231 Encounter for screening mammogram for malignant neoplasm of breast: Secondary | ICD-10-CM | POA: Diagnosis not present

## 2019-02-08 DIAGNOSIS — Z171 Estrogen receptor negative status [ER-]: Secondary | ICD-10-CM | POA: Diagnosis not present

## 2019-02-08 DIAGNOSIS — C50312 Malignant neoplasm of lower-inner quadrant of left female breast: Secondary | ICD-10-CM | POA: Diagnosis not present

## 2019-02-08 DIAGNOSIS — M810 Age-related osteoporosis without current pathological fracture: Secondary | ICD-10-CM

## 2019-02-08 NOTE — Progress Notes (Signed)
Margaret Thompson, [place] [id number]   CLINIC:  Medical Oncology/Hematology  PCP:  Redmond School, [place] [address] [phone]   REASON FOR VISIT: Follow-up for breast cancer  CURRENT THERAPY: Observation  BRIEF ONCOLOGIC HISTORY:  Oncology History  Malignant neoplasm of left female breast ([place])  08/21/2016 Pathology Results   Left needle core biopsy, lower-inner quadrant: invasive ductal carcinoma and DCIS.  ER/PR-, HER2-.    01/16/2017 Surgery   Left simple mastectomy by Dr. Alean Rinne ductal carcinoma, grade 3, measuring 1.8 cm with DCIS (high-grade).  Lymphovascular invasion is identified.  Clear surgical margins.      CANCER STAGING: Cancer Staging History of left breast cancer (2003) Staging form: Breast, AJCC 7th Edition - Clinical: Stage IIA (T1c, N1, cM0) - Signed by Baird Cancer, PA-C on 11/23/2012  Malignant neoplasm of left female breast [facility]) Staging form: Breast, AJCC 8th Edition - Pathologic stage from 01/19/2017: Stage Unknown (pT1c, pNX, cM0, G3, ER: Negative, PR: Negative, HER2: Negative) - Signed by Baird Cancer, PA-C on 02/27/2017    INTERVAL HISTORY:  Margaret Thompson 86 y.o. female was called for a telephone visit for her breast cancer.  Patient reports she has been doing well since her last visit.  She denies any new lumps or bumps present.  She denies any new onset fatigue.  She denies any new bone pain. Denies any nausea, vomiting, or diarrhea. Denies any new pains. Had not noticed any recent bleeding such as epistaxis, hematuria or hematochezia. Denies recent chest pain on exertion, shortness of breath on minimal exertion, pre-syncopal episodes, or palpitations. Denies any numbness or tingling in hands or feet. Denies any recent fevers, infections, or recent hospitalizations. Patient reports appetite at 100% and energy level at 100%.  She is eating well maintain her weight at this  time.     REVIEW OF SYSTEMS:  Review of Systems  All other systems reviewed and are negative.    PAST MEDICAL/SURGICAL HISTORY:  Past Medical History:  Diagnosis Date  . Acute MI ([place]) 2003  . Adenocarcinoma of left breast ([place]) 11/23/2012   Stage II (T1 C. N1 M0) adenocarcinoma of the left breast with surgery on 03/08/2001. She had a 1.5 cm cancer with 1/6 positive sentinel nodes. Reexcision margins were clear. She did participate in NSABP B.-30 randomized to Adriamycin and Taxotere for 4 cycles followed by radiation therapy from 07/13/2001 to 08/27/2001. She then started Arimidex on 07/07/2001 took that until the end of June 2008. T  . Anemia   . Bilateral cataracts   . Breast cancer ([place])   . GERD (gastroesophageal reflux disease)   . History of gout   . Hypercholesteremia   . Hypertension   . Malignant neoplasm of lower-inner quadrant of left female breast ([place])   . Osteoporosis    Past Surgical History:  Procedure Laterality Date  . ABDOMINAL HYSTERECTOMY    . BREAST SURGERY    . CATARACT EXTRACTION W/PHACO Left 09/04/2014   Procedure: CATARACT EXTRACTION PHACO AND INTRAOCULAR LENS PLACEMENT (IOC);  Surgeon: Tonny Branch, MD;  Location: [facility];  Service: Ophthalmology;  Laterality: Left;  CDE:9.84  . CATARACT EXTRACTION W/PHACO Right 11/09/2014   Procedure: CATARACT EXTRACTION PHACO AND INTRAOCULAR LENS PLACEMENT (IOC);  Surgeon: Tonny Branch, MD;  Location: [facility];  Service: Ophthalmology;  Laterality: Right;  CDE:8.40  . CORONARY ANGIOPLASTY     RCA stent  . ESOPHAGOGASTRODUODENOSCOPY (EGD) WITH PROPOFOL N/A 08/04/2017  Procedure: ESOPHAGOGASTRODUODENOSCOPY (EGD) WITH PROPOFOL;  Surgeon: Danie Binder, MD;  [facility];  Service: Endoscopy;  Laterality: N/A;  7:30am  . LEFT HEART CATHETERIZATION WITH CORONARY ANGIOGRAM N/A 12/21/2012   Procedure: LEFT HEART CATHETERIZATION WITH CORONARY ANGIOGRAM;  Surgeon: Clent Demark, MD;  Location: [facility] CATH LAB;  Service:  Cardiovascular;  Laterality: N/A;  . MASTECTOMY MODIFIED RADICAL Left 01/16/2017   Procedure: SIMPLE MASTECTOMY;  Surgeon: Aviva Signs, MD;  Location: [facility];  Service: General;  Laterality: Left;  Marland Kitchen MASTECTOMY, PARTIAL Left   . PERCUTANEOUS STENT INTERVENTION     RCA  . PORT-A-CATH REMOVAL    . PORT-A-CATH REMOVAL Right 09/13/2018   Procedure: REMOVAL PORT-A-CATH;  Surgeon: Aviva Signs, MD;  Location: [facility];  Service: General;  Laterality: Right;  . PORTACATH PLACEMENT    . PORTACATH PLACEMENT Right 03/11/2017   Procedure: INSERTION PORT-A-CATH;  Surgeon: Aviva Signs, MD;  Location: [facility];  Service: General;  Laterality: Right;  . SAVORY DILATION N/A 08/04/2017   Procedure: SAVORY DILATION;  Surgeon: Danie Binder, MD;  [facility];  Service: Endoscopy;  Laterality: N/A;     SOCIAL HISTORY:  Social History   Socioeconomic History  . Marital status: Widowed    Spouse name: Not on file  . Number of children: Not on file  . Years of education: Not on file  . Highest education level: Not on file  Occupational History  . Not on file  Tobacco Use  . Smoking status: Current Every Day Smoker    Packs/day: 0.50    Years: 50.00    Pack years: 25.00    Types: Cigarettes  . Smokeless tobacco: Never Used  . Tobacco comment: 10 cigarettes daily  Substance and Sexual Activity  . Alcohol use: No  . Drug use: No  . Sexual activity: Not Currently    Birth control/protection: Surgical  Other Topics Concern  . Not on file  Social History Narrative  . Not on file   Social Determinants of Health   Financial Resource Strain:   . Difficulty of Paying Living Expenses: Not on file  Food Insecurity:   . Worried About Charity fundraiser in the Last Year: Not on file  . Ran Out of Food in the Last Year: Not on file  Transportation Needs:   . Lack of Transportation (Medical): Not on file  . Lack of Transportation (Non-Medical): Not on file  Physical Activity:   . Days of  Exercise per Week: Not on file  . Minutes of Exercise per Session: Not on file  Stress:   . Feeling of Stress : Not on file  Social Connections:   . Frequency of Communication with Friends and Family: Not on file  . Frequency of Social Gatherings with Friends and Family: Not on file  . Attends Religious Services: Not on file  . Active Member of Clubs or Organizations: Not on file  . Attends Archivist Meetings: Not on file  . Marital Status: Not on file  Intimate Partner Violence:   . Fear of Current or Ex-Partner: Not on file  . Emotionally Abused: Not on file  . Physically Abused: Not on file  . Sexually Abused: Not on file    FAMILY HISTORY:  Family History  Problem Relation Age of Onset  . Stroke Mother   . Colon cancer Neg Hx   . Gastric cancer Neg Hx   . Esophageal cancer Neg Hx  CURRENT MEDICATIONS:  Outpatient Encounter Medications as of 02/08/2019  Medication Sig  . Artificial Tear Solution (SOOTHE XP OP) Place 1-2 drops into both eyes daily as needed (dry eye).   Marland Kitchen aspirin EC 81 MG tablet Take 81 mg by mouth daily.  Marland Kitchen atorvastatin (LIPITOR) 40 MG tablet Take 40 mg by mouth daily.   Marland Kitchen CARTIA XT 180 MG 24 hr capsule Take 180 mg by mouth daily.  . Cyanocobalamin (B-12 COMPLIANCE INJECTION) 1000 MCG/ML KIT Inject 1 Dose as directed every 30 (thirty) days.   . pantoprazole (PROTONIX) 40 MG tablet TAKE ONE TABLET (40MG TOTAL) BY MOUTH DAILY (Patient taking differently: Take 40 mg by mouth daily at 6 PM. )   No facility-administered encounter medications on file as of 02/08/2019.    ALLERGIES:  Allergies  Allergen Reactions  . Azithromycin Hives     Vital signs: -Deferred due to telephone visit  Physical Exam -Deferred due to telephone visit -Patient was alert and oriented over the phone and in no acute distress.  LABORATORY DATA:  I have reviewed the labs as listed.  CBC    Component Value Date/Time   WBC 8.2 01/26/2019 1533   RBC 4.38  01/26/2019 1533   HGB 13.4 01/26/2019 1533   HCT 42.3 01/26/2019 1533   PLT 319 01/26/2019 1533   MCV 96.6 01/26/2019 1533   MCH 30.6 01/26/2019 1533   MCHC 31.7 01/26/2019 1533   RDW 12.1 01/26/2019 1533   LYMPHSABS 3.6 01/26/2019 1533   MONOABS 0.5 01/26/2019 1533   EOSABS 0.2 01/26/2019 1533   BASOSABS 0.1 01/26/2019 1533   CMP Latest Ref Rng & Units 01/26/2019 07/28/2018 01/20/2018  Glucose 70 - 99 mg/dL 98 126(H) 92  BUN 8 - 23 mg/dL 10 10 9   Creatinine 0.44 - 1.00 mg/dL 0.79 0.89 0.93  Sodium 135 - 145 mmol/L 139 139 140  Potassium 3.5 - 5.1 mmol/L 3.7 3.4(L) 4.0  Chloride 98 - 111 mmol/L 104 105 105  CO2 22 - 32 mmol/L 27 25 24   Calcium 8.9 - 10.3 mg/dL 9.3 9.0 9.0  Total Protein 6.5 - 8.1 g/dL 7.6 7.5 7.6  Total Bilirubin 0.3 - 1.2 mg/dL 0.4 0.6 0.6  Alkaline Phos 38 - 126 U/L 78 18(L) 77  AST 15 - 41 U/L 14(L) 16 17  ALT 0 - 44 U/L 11 11 14    All questions were answered to patient's stated satisfaction. Encouraged patient to call with any new concerns or questions before his next visit to the cancer center and we can certain see him sooner, if needed.      ASSESSMENT & PLAN:   Malignant neoplasm of left female breast ([place]) 1.  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 Arnoldo Morale on 01/16/2017 showing a 1.8 cm primary tumor, grade 3, LVI positive, clear surgical margins, with high-grade DCIS.  Lymph nodes cannot be removed due to prior history of left breast cancer treatment. -PET scan on 03/27/2017 showed mildly hypermetabolic small prevascular lymph node and right lower peritracheal lymph node indeterminate.  Moderate metabolic activity associated with ill defined left upper lobe nodule. -PET scan on 08/15/2017 showed mild reduction in size and activity of the left and left upper lobe pulmonary nodules, favoring benign etiology.  Stable small prevascular lymph nodes and reduced size and activity of the lower pretracheal lymph node.   Overall suggest reactive/inflammatory etiology. -Adjuvant chemotherapy was not recommended as more than 7 months have elapsed since her  surgery. -She reports mild tenderness at the mastectomy site which is stable.  She also reports tightness at times. -She was given a referral to remove her Port-A-Cath at her last visit. -Last mammogram done on 08/30/2018 was BI-RADS Category 1 negative. -Labs done on 01/26/2019 were all WNL. -She will follow up in 7 months with repeat labs, mammogram and DEXA scan.  2.  History of node positive left breast cancer: -Diagnosed on 02/16/2001, T1 CN1M0, stage IIa, ER/PR positive, HER-2 negative, 1.5 cm, 1 out of 6 lymph nodes positive.  3.  Osteoporosis: -Last DEXA scan done on 11/09/2017 showed a T score of -3.6. -We have placed her on calcium and vitamin D twice daily. -We will get a DEXA scan at her next visit.      Orders placed this encounter:  Orders Placed This Encounter  Procedures  . MM Digital Screening Unilat R  . DG Bone Density  . Lactate dehydrogenase  . CBC with Differential/Platelet  . Comprehensive metabolic panel  . Vitamin B12  . VITAMIN D 25 Hydroxy (Vit-D Deficiency, Fractures)  . Folate    I provided 22 minutes of non face-to-face telephone visit time during this encounter, and > 50% was spent counseling as documented under my assessment & plan.  Francene Finders, FNP-C [place] [phone]

## 2019-02-08 NOTE — Assessment & Plan Note (Signed)
1.  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 Jenkins on 01/16/2017 showing a 1.8 cm primary tumor, grade 3, LVI positive, clear surgical margins, with high-grade DCIS.  Lymph nodes cannot be removed due to prior history of left breast cancer treatment. -PET scan on 03/27/2017 showed mildly hypermetabolic small prevascular lymph node and right lower peritracheal lymph node indeterminate.  Moderate metabolic activity associated with ill defined left upper lobe nodule. -PET scan on 08/15/2017 showed mild reduction in size and activity of the left and left upper lobe pulmonary nodules, favoring benign etiology.  Stable small prevascular lymph nodes and reduced size and activity of the lower pretracheal lymph node.  Overall suggest reactive/inflammatory etiology. -Adjuvant chemotherapy was not recommended as more than 7 months have elapsed since her surgery. -She reports mild tenderness at the mastectomy site which is stable.  She also reports tightness at times. -She was given a referral to remove her Port-A-Cath at her last visit. -Last mammogram done on 08/30/2018 was BI-RADS Category 1 negative. -Labs done on 01/26/2019 were all WNL. -She will follow up in 7 months with repeat labs, mammogram and DEXA scan.  2.  History of node positive left breast cancer: -Diagnosed on 02/16/2001, T1 CN1M0, stage IIa, ER/PR positive, HER-2 negative, 1.5 cm, 1 out of 6 lymph nodes positive.  3.  Osteoporosis: -Last DEXA scan done on 11/09/2017 showed a T score of -3.6. -We have placed her on calcium and vitamin D twice daily. -We will get a DEXA scan at her next visit. 

## 2019-02-08 NOTE — Patient Instructions (Signed)
[place] at [facility] Discharge Instructions  Follow up in August with labs, mammogram, and DEXA scan.   Thank you for choosing [place] at [facility] to provide your oncology and hematology care.  To afford each patient quality time with our provider, please arrive at least 15 minutes before your scheduled appointment time.   If you have a lab appointment with the [facility] please come in thru the Main Entrance and check in at the main information desk.  You need to re-schedule your appointment should you arrive 10 or more minutes late.  We strive to give you quality time with our providers, and arriving late affects you and other patients whose appointments are after yours.  Also, if you no show three or more times for appointments you may be dismissed from the clinic at the providers discretion.     Again, thank you for choosing [facility].  Our hope is that these requests will decrease the amount of time that you wait before being seen by our physicians.       _____________________________________________________________  Should you have questions after your visit to [facility], please contact our office at [phone] between the hours of 8:00 a.m. and 4:30 p.m.  Voicemails left after 4:00 p.m. will not be returned until the following business day.  For prescription refill requests, have your pharmacy contact our office and allow 72 hours.    Due to Covid, you will need to wear a mask upon entering the hospital. If you do not have a mask, a mask will be given to you at the Main Entrance upon arrival. For doctor visits, patients may have 1 support person with them. For treatment visits, patients can not have anyone with them due to social distancing guidelines and our immunocompromised population.

## 2019-02-13 DIAGNOSIS — I2510 Atherosclerotic heart disease of native coronary artery without angina pectoris: Secondary | ICD-10-CM | POA: Diagnosis not present

## 2019-02-13 DIAGNOSIS — I129 Hypertensive chronic kidney disease with stage 1 through stage 4 chronic kidney disease, or unspecified chronic kidney disease: Secondary | ICD-10-CM | POA: Diagnosis not present

## 2019-02-13 DIAGNOSIS — N1831 Chronic kidney disease, stage 3a: Secondary | ICD-10-CM | POA: Diagnosis not present

## 2019-02-13 DIAGNOSIS — E7849 Other hyperlipidemia: Secondary | ICD-10-CM | POA: Diagnosis not present

## 2019-02-14 ENCOUNTER — Ambulatory Visit: Payer: Medicare Other | Admitting: Nurse Practitioner

## 2019-02-14 ENCOUNTER — Other Ambulatory Visit: Payer: Self-pay

## 2019-02-14 ENCOUNTER — Encounter: Payer: Self-pay | Admitting: Nurse Practitioner

## 2019-02-14 VITALS — BP 165/86 | HR 101 | Temp 96.2°F | Ht 62.0 in | Wt 117.8 lb

## 2019-02-14 DIAGNOSIS — R1013 Epigastric pain: Secondary | ICD-10-CM

## 2019-02-14 DIAGNOSIS — K219 Gastro-esophageal reflux disease without esophagitis: Secondary | ICD-10-CM | POA: Diagnosis not present

## 2019-02-14 DIAGNOSIS — G8929 Other chronic pain: Secondary | ICD-10-CM

## 2019-02-14 MED ORDER — PANTOPRAZOLE SODIUM 40 MG PO TBEC: 40.0000 mg | DELAYED_RELEASE_TABLET | Freq: Two times a day (BID) | ORAL | 5 refills | Status: DC

## 2019-02-14 NOTE — Patient Instructions (Signed)
Your health issues we discussed today were:   GERD (reflux/heartburn): 1. I am glad your symptoms are mostly better than they were 2. As we discussed, because you are having "breakthrough" reflux symptoms in the evenings I will increase your medication dosage 3. I sent a new prescription to your pharmacy for Protonix 40 mg twice a day 4. Take this first thing in the morning, before eating (as you have been) and then again 30 minutes before your evening meal 5. Call us in 1 month and let us know if it is helping your symptoms 6. Call us if you have any worsening or severe symptoms  Overall I recommend:  1. Continue your other current medication 2. Return for follow-up in 6 months 3. Call us if you have any questions or concerns   ---------------------------------------------------------------  COVID-19 Vaccine Information can be found at: [email or web address].co.uk For questions related to vaccine distribution or appointments, please email [email or web address] [email or web address] or call [phone].   ---------------------------------------------------------------   At [facility] we value your feedback. You may receive a survey about your visit today. Please share your experience as we strive to create trusting relationships with our patients to provide genuine, compassionate, quality care.  We appreciate your understanding and patience as we review any laboratory studies, imaging, and other diagnostic tests that are ordered as we care for you. Our office policy is 5 business days for review of these results, and any emergent or urgent results are addressed in a timely manner for your best interest. If you do not hear from our office in 1 week, please contact us.   We also encourage the use of MyChart, which contains your medical information for your review as well. If you are not enrolled in this feature, an access code is on this after  visit summary for your convenience. Thank you for allowing [place] to be involved in your care.  It was great to see you today!  I hope you have a great day!!

## 2019-02-14 NOTE — Progress Notes (Signed)
Referring Provider: Redmond School, MD Primary Care Physician:  Redmond School, MD Primary GI:  Dr. Oneida Alar  Chief Complaint  Patient presents with  . Abdominal Pain    burning in throat,vomiting    HPI:   Margaret Thompson is a 86 y.o. female who presents for follow-up on GERD and epigastric pain.  Patient was last seen in our office 01/30/2019 for GERD and constipation.  History of adenocarcinoma of the left breast currently being seen by oncology.  EGD on file 2019 with moderate Schatzki's ring status post dilation, mild esophagitis, medium hiatal hernia with gastritis/duodenitis likely due to aspirin use.  Colonoscopy most recently 06/14/2008 with recommended 10-year repeat in 2020 due to tubular adenoma polyps.  Previous recommendation of indefinite Protonix.  Most recent oncology office visit dated 08/04/2018.  At that time noted no adjuvant chemotherapy due to 7 months since surgery.  Status post left mastectomy.  Port-A-Cath not giving blood return and subsequently recommended removal.  Recommended follow-up with oncology in 6 months.  Plan for mammogram in August.  At her last visit noted worsening GERD with occasional vomiting, still taking Protonix "as needed".  Significant GERD breakthrough every other day with intermittent nausea and vomiting about 3-4 times a week.  Some foods cause a "pressure" sensation it is not relieved until she vomits.  GERD symptoms typically late in the afternoon and evening.  Typically takes her PPI in the afternoon when she does.  She generally has been using this about 4 times a week.  No other overt GI complaints.  Recommended increase water intake, start Colace 100 mg daily, can add fiber supplement if no improvement with Colace.  Recommended taking Protonix once a day, every day, first thing in the morning.  Call with progress report after 2 weeks and let us know if your symptoms improve.  Query need to increase frequency or change to a different PPI.   Discuss swallowing issues at follow-up to determine if further endoscopic evaluation needed.  Follow-up in 6 to 8 weeks.  Patient's follow-up was a virtual visit.  I was informed by the nurse that the patient said she had to go into town soon and if she was not home that she could call the next day and have a visit then.  I attempted to call the patient at the time of her visit and let the phone ring at least 10-12 times with no answer at which point I hung up.  She was sent a no-show letter.  She called our office the next week upset that she received a no-show letter, her phone number was verified, and she was offered the opportunity to reschedule but wanted to speak to the nurse.  She stated that when she said she was on her way into town it was to come to an in person office visit with our office.  However, the patient did not show up at our office or we could have seen her.  She was subsequently rescheduled to 02/14/2019.  Today she states she's doing ok overall. Having what sounds to be some persistent GERD symptoms with "burning in the throat" until she vomits. It only occurs about twice a week. When she vomits "a little bit" the burning stops. Doesn't take anything for breakthrough. Currently on Protonix 57m daily that she takes at 1800 every day. Review of her past medication list indicates she has not tried any other PPIs. Denies abdominal pain. No other N/V other then when she has  the burning symptoms as above. Denies dysphagia and odynophagia. Avoids triggers. No other identified triggers (other than spicy foods, which she avoids). Denies changes in bwoel habits. Denies hematochezia, melena, fever, chills, unintentional weight loss. Denies URI or flu-like symptoms. Denies loss of sense of taste or smell. Denies chest pain, dyspnea, dizziness, lightheadedness, syncope, near syncope. Denies any other upper or lower GI symptoms.  She takes her Protonix in the mornings. Symptoms typically late in the  evening.  Past Medical History:  Diagnosis Date  . Acute MI (HCC) 2003  . Adenocarcinoma of left breast (HCC) 11/23/2012   Stage II (T1 C. N1 M0) adenocarcinoma of the left breast with surgery on 03/08/2001. She had a 1.5 cm cancer with 1/6 positive sentinel nodes. Reexcision margins were clear. She did participate in NSABP B.-30 randomized to Adriamycin and Taxotere for 4 cycles followed by radiation therapy from 07/13/2001 to 08/27/2001. She then started Arimidex on 07/07/2001 took that until the end of June 2008. T  . Anemia   . Bilateral cataracts   . Breast cancer (HCC)   . GERD (gastroesophageal reflux disease)   . History of gout   . Hypercholesteremia   . Hypertension   . Malignant neoplasm of lower-inner quadrant of left female breast (HCC)   . Osteoporosis     Past Surgical History:  Procedure Laterality Date  . ABDOMINAL HYSTERECTOMY    . BREAST SURGERY    . CATARACT EXTRACTION W/PHACO Left 09/04/2014   Procedure: CATARACT EXTRACTION PHACO AND INTRAOCULAR LENS PLACEMENT (IOC);  Surgeon: Kerry Hunt, MD;  Location: [facility];  Service: Ophthalmology;  Laterality: Left;  CDE:9.84  . CATARACT EXTRACTION W/PHACO Right 11/09/2014   Procedure: CATARACT EXTRACTION PHACO AND INTRAOCULAR LENS PLACEMENT (IOC);  Surgeon: Kerry Hunt, MD;  Location: [facility];  Service: Ophthalmology;  Laterality: Right;  CDE:8.40  . CORONARY ANGIOPLASTY     RCA stent  . ESOPHAGOGASTRODUODENOSCOPY (EGD) WITH PROPOFOL N/A 08/04/2017   Procedure: ESOPHAGOGASTRODUODENOSCOPY (EGD) WITH PROPOFOL;  Surgeon: Fields, Sandi L, MD;  Location: [facility];  Service: Endoscopy;  Laterality: N/A;  7:30am  . LEFT HEART CATHETERIZATION WITH CORONARY ANGIOGRAM N/A 12/21/2012   Procedure: LEFT HEART CATHETERIZATION WITH CORONARY ANGIOGRAM;  Surgeon: Mohan N Harwani, MD;  Location: [facility];  Service: Cardiovascular;  Laterality: N/A;  . MASTECTOMY MODIFIED RADICAL Left 01/16/2017   Procedure: SIMPLE MASTECTOMY;  Surgeon:  Jenkins, Mark, MD;  Location: [facility];  Service: General;  Laterality: Left;  . MASTECTOMY, PARTIAL Left   . PERCUTANEOUS STENT INTERVENTION     RCA  . PORT-A-CATH REMOVAL    . PORT-A-CATH REMOVAL Right 09/13/2018   Procedure: REMOVAL PORT-A-CATH;  Surgeon: Jenkins, Mark, MD;  Location: [facility];  Service: General;  Laterality: Right;  . PORTACATH PLACEMENT    . PORTACATH PLACEMENT Right 03/11/2017   Procedure: INSERTION PORT-A-CATH;  Surgeon: Jenkins, Mark, MD;  Location: [facility];  Service: General;  Laterality: Right;  . SAVORY DILATION N/A 08/04/2017   Procedure: SAVORY DILATION;  Surgeon: Fields, Sandi L, MD;  Location: [facility];  Service: Endoscopy;  Laterality: N/A;    Current Outpatient Medications  Medication Sig Dispense Refill  . Artificial Tear Solution (SOOTHE XP OP) Place 1-2 drops into both eyes daily as needed (dry eye).     . aspirin EC 81 MG tablet Take 81 mg by mouth daily.    . atorvastatin (LIPITOR) 40 MG tablet Take 40 mg by mouth daily.     . CARTIA XT 180   MG 24 hr capsule Take 180 mg by mouth daily.    . Cyanocobalamin (B-12 COMPLIANCE INJECTION) 1000 MCG/ML KIT Inject 1 Dose as directed every 30 (thirty) days.     . pantoprazole (PROTONIX) 40 MG tablet TAKE ONE TABLET (40MG TOTAL) BY MOUTH DAILY (Patient taking differently: Take 40 mg by mouth daily. ) 90 tablet 3   No current facility-administered medications for this visit.    Allergies as of 02/14/2019 - Review Complete 02/14/2019  Allergen Reaction Noted  . Azithromycin Hives 09/15/2010    Family History  Problem Relation Age of Onset  . Stroke Mother   . Colon cancer Neg Hx   . Gastric cancer Neg Hx   . Esophageal cancer Neg Hx     Social History   Socioeconomic History  . Marital status: Widowed    Spouse name: Not on file  . Number of children: Not on file  . Years of education: Not on file  . Highest education level: Not on file  Occupational History  . Not on file  Tobacco Use  .  Smoking status: Current Every Day Smoker    Packs/day: 0.50    Years: 50.00    Pack years: 25.00    Types: Cigarettes  . Smokeless tobacco: Never Used  . Tobacco comment: 10 cigarettes daily  Substance and Sexual Activity  . Alcohol use: No  . Drug use: No  . Sexual activity: Not Currently    Birth control/protection: Surgical  Other Topics Concern  . Not on file  Social History Narrative  . Not on file   Social Determinants of Health   Financial Resource Strain:   . Difficulty of Paying Living Expenses: Not on file  Food Insecurity:   . Worried About Running Out of Food in the Last Year: Not on file  . Ran Out of Food in the Last Year: Not on file  Transportation Needs:   . Lack of Transportation (Medical): Not on file  . Lack of Transportation (Non-Medical): Not on file  Physical Activity:   . Days of Exercise per Week: Not on file  . Minutes of Exercise per Session: Not on file  Stress:   . Feeling of Stress : Not on file  Social Connections:   . Frequency of Communication with Friends and Family: Not on file  . Frequency of Social Gatherings with Friends and Family: Not on file  . Attends Religious Services: Not on file  . Active Member of Clubs or Organizations: Not on file  . Attends Club or Organization Meetings: Not on file  . Marital Status: Not on file    Review of Systems: General: Negative for anorexia, weight loss, fever, chills, fatigue, weakness. ENT: Negative for hoarseness, difficulty swallowing. CV: Negative for chest pain, angina, palpitations, peripheral edema.  Respiratory: Negative for dyspnea at rest, cough, sputum, wheezing.  GI: See history of present illness. Endo: Negative for unusual weight change.  Heme: Negative for bruising or bleeding. Allergy: Negative for rash or hives.   Physical Exam: BP (!) 165/86   Pulse (!) 101   Temp (!) 96.2 F (35.7 C) (Temporal)   Ht 5' 2" (1.575 m)   Wt 117 lb 12.8 oz (53.4 kg)   BMI 21.55 kg/m    General:   Alert and oriented. Pleasant and cooperative. Well-nourished and well-developed.  Eyes:  Without icterus, sclera clear and conjunctiva pink.  Ears:  Normal auditory acuity. Cardiovascular:  S1, S2 present without murmurs appreciated. Extremities without clubbing   or edema. Respiratory:  Clear to auscultation bilaterally. No wheezes, rales, or rhonchi. No distress.  Gastrointestinal:  +BS, soft, non-tender and non-distended. No HSM noted. No guarding or rebound. No masses appreciated.  Rectal:  Deferred  Musculoskalatal:  Symmetrical without gross deformities. Neurologic:  Alert and oriented x4;  grossly normal neurologically. Psych:  Alert and cooperative. Normal mood and affect. Heme/Lymph/Immune: No excessive bruising noted.    02/14/2019 3:40 PM   Disclaimer: This note was dictated with voice recognition software. Similar sounding words can inadvertently be transcribed and may not be corrected upon review.  

## 2019-02-14 NOTE — Assessment & Plan Note (Signed)
Overall, GERD symptoms seem improved with Protonix every day rather than as needed.  However, she is about 2-3 times a week she has breakthrough GERD symptoms.  This typically occurs in the evening.  She takes her Protonix pursing in the morning.  Query need for increased dosage to twice daily Protonix versus change to another regimen.  At this point I would like to increase her dosing to twice daily to see if this has any result in improvement.  If not we can change options at her follow-up visit or upon phone call indicating worsening or no improvement.  Recommend she follow-up in 6 months.  Call us in 1 month and let us know if this is helping her symptoms.

## 2019-02-14 NOTE — Assessment & Plan Note (Signed)
Abdominal pain has essentially resolved since she has been taking Protonix daily rather than as needed.  Continue current medications with dosage adjustments as per above.  Follow-up in 6 months.

## 2019-02-15 NOTE — Progress Notes (Signed)
CC'ED TO PCP 

## 2019-02-18 DIAGNOSIS — E538 Deficiency of other specified B group vitamins: Secondary | ICD-10-CM | POA: Diagnosis not present

## 2019-03-11 DIAGNOSIS — Z6820 Body mass index (BMI) 20.0-20.9, adult: Secondary | ICD-10-CM | POA: Diagnosis not present

## 2019-03-11 DIAGNOSIS — M545 Low back pain: Secondary | ICD-10-CM | POA: Diagnosis not present

## 2019-03-30 DIAGNOSIS — D510 Vitamin B12 deficiency anemia due to intrinsic factor deficiency: Secondary | ICD-10-CM | POA: Diagnosis not present

## 2019-04-14 ENCOUNTER — Other Ambulatory Visit (HOSPITAL_COMMUNITY): Payer: Self-pay | Admitting: Nurse Practitioner

## 2019-04-14 DIAGNOSIS — B372 Candidiasis of skin and nail: Secondary | ICD-10-CM

## 2019-04-14 DIAGNOSIS — M792 Neuralgia and neuritis, unspecified: Secondary | ICD-10-CM

## 2019-04-14 MED ORDER — NYSTATIN 100000 UNIT/GM EX POWD: 1.0000 "application " | Freq: Three times a day (TID) | CUTANEOUS | 0 refills | Status: DC

## 2019-04-14 MED ORDER — GABAPENTIN 100 MG PO CAPS: 100.0000 mg | ORAL_CAPSULE | Freq: Every day | ORAL | 5 refills | Status: DC

## 2019-04-20 DIAGNOSIS — J3489 Other specified disorders of nose and nasal sinuses: Secondary | ICD-10-CM | POA: Diagnosis not present

## 2019-04-20 DIAGNOSIS — L989 Disorder of the skin and subcutaneous tissue, unspecified: Secondary | ICD-10-CM | POA: Diagnosis not present

## 2019-04-28 DIAGNOSIS — I10 Essential (primary) hypertension: Secondary | ICD-10-CM | POA: Diagnosis not present

## 2019-04-28 DIAGNOSIS — R7303 Prediabetes: Secondary | ICD-10-CM | POA: Diagnosis not present

## 2019-04-28 DIAGNOSIS — E785 Hyperlipidemia, unspecified: Secondary | ICD-10-CM | POA: Diagnosis not present

## 2019-04-28 DIAGNOSIS — I2510 Atherosclerotic heart disease of native coronary artery without angina pectoris: Secondary | ICD-10-CM | POA: Diagnosis not present

## 2019-05-04 DIAGNOSIS — Z1389 Encounter for screening for other disorder: Secondary | ICD-10-CM | POA: Diagnosis not present

## 2019-05-04 DIAGNOSIS — E538 Deficiency of other specified B group vitamins: Secondary | ICD-10-CM | POA: Diagnosis not present

## 2019-05-04 DIAGNOSIS — Z681 Body mass index (BMI) 19 or less, adult: Secondary | ICD-10-CM | POA: Diagnosis not present

## 2019-05-04 DIAGNOSIS — Z0001 Encounter for general adult medical examination with abnormal findings: Secondary | ICD-10-CM | POA: Diagnosis not present

## 2019-05-04 DIAGNOSIS — E782 Mixed hyperlipidemia: Secondary | ICD-10-CM | POA: Diagnosis not present

## 2019-05-04 DIAGNOSIS — Z0000 Encounter for general adult medical examination without abnormal findings: Secondary | ICD-10-CM | POA: Diagnosis not present

## 2019-05-04 DIAGNOSIS — L989 Disorder of the skin and subcutaneous tissue, unspecified: Secondary | ICD-10-CM | POA: Diagnosis not present

## 2019-05-13 DIAGNOSIS — E7849 Other hyperlipidemia: Secondary | ICD-10-CM | POA: Diagnosis not present

## 2019-05-13 DIAGNOSIS — N1831 Chronic kidney disease, stage 3a: Secondary | ICD-10-CM | POA: Diagnosis not present

## 2019-05-13 DIAGNOSIS — I129 Hypertensive chronic kidney disease with stage 1 through stage 4 chronic kidney disease, or unspecified chronic kidney disease: Secondary | ICD-10-CM | POA: Diagnosis not present

## 2019-05-13 DIAGNOSIS — I2510 Atherosclerotic heart disease of native coronary artery without angina pectoris: Secondary | ICD-10-CM | POA: Diagnosis not present

## 2019-05-19 DIAGNOSIS — C44329 Squamous cell carcinoma of skin of other parts of face: Secondary | ICD-10-CM | POA: Diagnosis not present

## 2019-05-19 DIAGNOSIS — C44622 Squamous cell carcinoma of skin of right upper limb, including shoulder: Secondary | ICD-10-CM | POA: Diagnosis not present

## 2019-05-19 DIAGNOSIS — C44612 Basal cell carcinoma of skin of right upper limb, including shoulder: Secondary | ICD-10-CM | POA: Diagnosis not present

## 2019-05-19 DIAGNOSIS — C44319 Basal cell carcinoma of skin of other parts of face: Secondary | ICD-10-CM | POA: Diagnosis not present

## 2019-06-15 DIAGNOSIS — K7689 Other specified diseases of liver: Secondary | ICD-10-CM | POA: Diagnosis not present

## 2019-06-17 DIAGNOSIS — E538 Deficiency of other specified B group vitamins: Secondary | ICD-10-CM | POA: Diagnosis not present

## 2019-06-20 ENCOUNTER — Encounter: Payer: Self-pay | Admitting: Gastroenterology

## 2019-06-23 DIAGNOSIS — Z85828 Personal history of other malignant neoplasm of skin: Secondary | ICD-10-CM | POA: Diagnosis not present

## 2019-06-23 DIAGNOSIS — Z08 Encounter for follow-up examination after completed treatment for malignant neoplasm: Secondary | ICD-10-CM | POA: Diagnosis not present

## 2019-06-23 DIAGNOSIS — C44622 Squamous cell carcinoma of skin of right upper limb, including shoulder: Secondary | ICD-10-CM | POA: Diagnosis not present

## 2019-07-21 DIAGNOSIS — Z85828 Personal history of other malignant neoplasm of skin: Secondary | ICD-10-CM | POA: Diagnosis not present

## 2019-07-21 DIAGNOSIS — Z08 Encounter for follow-up examination after completed treatment for malignant neoplasm: Secondary | ICD-10-CM | POA: Diagnosis not present

## 2019-07-28 DIAGNOSIS — F17290 Nicotine dependence, other tobacco product, uncomplicated: Secondary | ICD-10-CM | POA: Diagnosis not present

## 2019-07-28 DIAGNOSIS — I10 Essential (primary) hypertension: Secondary | ICD-10-CM | POA: Diagnosis not present

## 2019-07-28 DIAGNOSIS — I2510 Atherosclerotic heart disease of native coronary artery without angina pectoris: Secondary | ICD-10-CM | POA: Diagnosis not present

## 2019-07-28 DIAGNOSIS — J45909 Unspecified asthma, uncomplicated: Secondary | ICD-10-CM | POA: Diagnosis not present

## 2019-07-28 DIAGNOSIS — E785 Hyperlipidemia, unspecified: Secondary | ICD-10-CM | POA: Diagnosis not present

## 2019-08-01 ENCOUNTER — Ambulatory Visit: Payer: Medicare Other | Admitting: Gastroenterology

## 2019-08-02 DIAGNOSIS — E538 Deficiency of other specified B group vitamins: Secondary | ICD-10-CM | POA: Diagnosis not present

## 2019-08-12 DIAGNOSIS — H43811 Vitreous degeneration, right eye: Secondary | ICD-10-CM | POA: Diagnosis not present

## 2019-09-01 ENCOUNTER — Other Ambulatory Visit (HOSPITAL_COMMUNITY): Payer: Medicare Other

## 2019-09-01 ENCOUNTER — Ambulatory Visit (HOSPITAL_COMMUNITY): Payer: Medicare Other

## 2019-09-02 ENCOUNTER — Other Ambulatory Visit: Payer: Self-pay

## 2019-09-02 ENCOUNTER — Ambulatory Visit (HOSPITAL_COMMUNITY)
Admission: RE | Admit: 2019-09-02 | Discharge: 2019-09-02 | Disposition: A | Discharge status: Home / Self Care | Payer: Medicare Other | Source: Ambulatory Visit | Attending: Nurse Practitioner | Admitting: Nurse Practitioner

## 2019-09-02 DIAGNOSIS — Z1231 Encounter for screening mammogram for malignant neoplasm of breast: Secondary | ICD-10-CM | POA: Insufficient documentation

## 2019-09-02 DIAGNOSIS — M810 Age-related osteoporosis without current pathological fracture: Secondary | ICD-10-CM

## 2019-09-02 DIAGNOSIS — Z780 Asymptomatic menopausal state: Secondary | ICD-10-CM | POA: Diagnosis not present

## 2019-09-05 ENCOUNTER — Ambulatory Visit (HOSPITAL_COMMUNITY): Payer: Medicare Other

## 2019-09-05 ENCOUNTER — Inpatient Hospital Stay (HOSPITAL_COMMUNITY): Payer: Medicare Other | Attending: Hematology

## 2019-09-05 ENCOUNTER — Other Ambulatory Visit (HOSPITAL_COMMUNITY): Payer: Medicare Other

## 2019-09-05 DIAGNOSIS — E559 Vitamin D deficiency, unspecified: Secondary | ICD-10-CM | POA: Insufficient documentation

## 2019-09-05 DIAGNOSIS — Z853 Personal history of malignant neoplasm of breast: Secondary | ICD-10-CM | POA: Diagnosis not present

## 2019-09-05 DIAGNOSIS — Z9012 Acquired absence of left breast and nipple: Secondary | ICD-10-CM | POA: Diagnosis not present

## 2019-09-05 DIAGNOSIS — F17210 Nicotine dependence, cigarettes, uncomplicated: Secondary | ICD-10-CM | POA: Diagnosis not present

## 2019-09-05 DIAGNOSIS — Z171 Estrogen receptor negative status [ER-]: Secondary | ICD-10-CM | POA: Diagnosis not present

## 2019-09-05 DIAGNOSIS — M810 Age-related osteoporosis without current pathological fracture: Secondary | ICD-10-CM | POA: Insufficient documentation

## 2019-09-05 DIAGNOSIS — C50312 Malignant neoplasm of lower-inner quadrant of left female breast: Secondary | ICD-10-CM | POA: Insufficient documentation

## 2019-09-05 LAB — VITAMIN B12: Vitamin B-12: 383 pg/mL (ref 180–914)

## 2019-09-05 LAB — COMPREHENSIVE METABOLIC PANEL
ALT: 10 U/L (ref 0–44)
AST: 16 U/L (ref 15–41)
Albumin: 3.8 g/dL (ref 3.5–5.0)
Alkaline Phosphatase: 99 U/L (ref 38–126)
Anion gap: 7 (ref 5–15)
BUN: 10 mg/dL (ref 8–23)
CO2: 24 mmol/L (ref 22–32)
Calcium: 9 mg/dL (ref 8.9–10.3)
Chloride: 104 mmol/L (ref 98–111)
Creatinine, Ser: 0.91 mg/dL (ref 0.44–1.00)
GFR calc Af Amer: >60 mL/min (ref >60)
GFR calc non Af Amer: 58 mL/min — ABNORMAL LOW (ref >60)
Glucose, Bld: 110 mg/dL — ABNORMAL HIGH (ref 70–99)
Potassium: 3.6 mmol/L (ref 3.5–5.1)
Sodium: 135 mmol/L (ref 135–145)
Total Bilirubin: 0.6 mg/dL (ref 0.3–1.2)
Total Protein: 7.3 g/dL (ref 6.5–8.1)

## 2019-09-05 LAB — CBC WITH DIFFERENTIAL/PLATELET
Abs Immature Granulocytes: 0.03 10*3/uL (ref 0.00–0.07)
Basophils Absolute: 0.1 10*3/uL (ref 0.0–0.1)
Basophils Relative: 1 %
Eosinophils Absolute: 0.2 10*3/uL (ref 0.0–0.5)
Eosinophils Relative: 2 %
HCT: 42 % (ref 36.0–46.0)
Hemoglobin: 13.3 g/dL (ref 12.0–15.0)
Immature Granulocytes: 0 %
Lymphocytes Relative: 30 %
Lymphs Abs: 2.9 10*3/uL (ref 0.7–4.0)
MCH: 29.8 pg (ref 26.0–34.0)
MCHC: 31.7 g/dL (ref 30.0–36.0)
MCV: 94.2 fL (ref 80.0–100.0)
Monocytes Absolute: 0.6 10*3/uL (ref 0.1–1.0)
Monocytes Relative: 6 %
Neutro Abs: 5.9 10*3/uL (ref 1.7–7.7)
Neutrophils Relative %: 61 %
Platelets: 344 10*3/uL (ref 150–400)
RBC: 4.46 MIL/uL (ref 3.87–5.11)
RDW: 12.7 % (ref 11.5–15.5)
WBC: 9.7 10*3/uL (ref 4.0–10.5)
nRBC: 0 % (ref 0.0–0.2)

## 2019-09-05 LAB — FOLATE: Folate: 6.5 ng/mL (ref >5.9)

## 2019-09-05 LAB — VITAMIN D 25 HYDROXY (VIT D DEFICIENCY, FRACTURES): Vit D, 25-Hydroxy: 24.64 ng/mL — ABNORMAL LOW (ref 30–100)

## 2019-09-05 LAB — LACTATE DEHYDROGENASE: LDH: 131 U/L (ref 98–192)

## 2019-09-06 MED ORDER — PEGFILGRASTIM 6 MG/0.6ML ~~LOC~~ PSKT
PREFILLED_SYRINGE | SUBCUTANEOUS | Status: AC
  Filled 2019-09-06: qty 0.6

## 2019-09-06 MED ORDER — FULVESTRANT 250 MG/5ML IM SOLN
INTRAMUSCULAR | Status: AC
  Filled 2019-09-06: qty 5

## 2019-09-07 ENCOUNTER — Ambulatory Visit (HOSPITAL_COMMUNITY): Payer: Medicare Other | Admitting: Nurse Practitioner

## 2019-09-07 ENCOUNTER — Encounter: Payer: Self-pay | Admitting: Nurse Practitioner

## 2019-09-07 ENCOUNTER — Ambulatory Visit: Payer: Medicare Other | Admitting: Gastroenterology

## 2019-09-08 ENCOUNTER — Ambulatory Visit: Payer: Medicare Other | Admitting: Gastroenterology

## 2019-09-09 ENCOUNTER — Inpatient Hospital Stay (HOSPITAL_BASED_OUTPATIENT_CLINIC_OR_DEPARTMENT_OTHER): Payer: Medicare Other | Admitting: Nurse Practitioner

## 2019-09-09 ENCOUNTER — Other Ambulatory Visit: Payer: Self-pay

## 2019-09-09 DIAGNOSIS — M810 Age-related osteoporosis without current pathological fracture: Secondary | ICD-10-CM | POA: Diagnosis not present

## 2019-09-09 DIAGNOSIS — F17210 Nicotine dependence, cigarettes, uncomplicated: Secondary | ICD-10-CM | POA: Diagnosis not present

## 2019-09-09 DIAGNOSIS — C50312 Malignant neoplasm of lower-inner quadrant of left female breast: Secondary | ICD-10-CM | POA: Diagnosis not present

## 2019-09-09 DIAGNOSIS — C50912 Malignant neoplasm of unspecified site of left female breast: Secondary | ICD-10-CM

## 2019-09-09 DIAGNOSIS — Z171 Estrogen receptor negative status [ER-]: Secondary | ICD-10-CM

## 2019-09-09 DIAGNOSIS — Z9012 Acquired absence of left breast and nipple: Secondary | ICD-10-CM | POA: Diagnosis not present

## 2019-09-09 DIAGNOSIS — Z853 Personal history of malignant neoplasm of breast: Secondary | ICD-10-CM | POA: Diagnosis not present

## 2019-09-09 DIAGNOSIS — E559 Vitamin D deficiency, unspecified: Secondary | ICD-10-CM | POA: Diagnosis not present

## 2019-09-09 NOTE — Assessment & Plan Note (Signed)
1.  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 Arnoldo Morale on 01/16/2017 showing a 1.8 cm primary tumor, grade 3, LVI positive, clear surgical margins, with high-grade DCIS.  Lymph nodes cannot be removed due to prior history of left breast cancer treatment. -PET scan on 03/27/2017 showed mildly hypermetabolic small prevascular lymph node and right lower peritracheal lymph node indeterminate.  Moderate metabolic activity associated with ill defined left upper lobe nodule. -PET scan on 08/15/2017 showed mild reduction in size and activity of the left and left upper lobe pulmonary nodules, favoring benign etiology.  Stable small prevascular lymph nodes and reduced size and activity of the lower pretracheal lymph node.  Overall suggest reactive/inflammatory etiology. -Adjuvant chemotherapy was not recommended as more than 7 months have elapsed since her surgery. -She reports mild tenderness at the mastectomy site which is stable.  She also reports tightness at times. -She was given a referral to remove her Port-A-Cath at her last visit. -Last mammogram done on 09/02/2019 was BI-RADS Category 1 negative. -Labs done on 09/05/2019 were all WNL. -She will follow up in 6 months with repeat labs  2.  History of node positive left breast cancer: -Diagnosed on 02/16/2001, T1 CN1M0, stage IIa, ER/PR positive, HER-2 negative, 1.5 cm, 1 out of 6 lymph nodes positive.  3.  Osteoporosis: -Last DEXA scan done on 09/02/2019 showed a T score of -3.4 -We have placed her on calcium and vitamin D twice daily.  4.  Vitamin D deficiency: -Labs done on 09/05/2019 showed vitamin D level 24.64 -She will start taking vitamin D 5000 units daily

## 2019-09-09 NOTE — Progress Notes (Signed)
[address], [place] [address]   CLINIC:  Medical Oncology/Hematology  PCP:  Redmond School, [place] [address] [phone]   REASON FOR VISIT: Follow-up for breast cancer   CURRENT THERAPY: observation  BRIEF ONCOLOGIC HISTORY:  Oncology History  Malignant neoplasm of left female breast ([place])  08/21/2016 Pathology Results   Left needle core biopsy, lower-inner quadrant: invasive ductal carcinoma and DCIS.  ER/PR-, HER2-.    01/16/2017 Surgery   Left simple mastectomy by Dr. Alean Rinne ductal carcinoma, grade 3, measuring 1.8 cm with DCIS (high-grade).  Lymphovascular invasion is identified.  Clear surgical margins.     CANCER STAGING: Cancer Staging History of left breast cancer (2003) Staging form: Breast, AJCC 7th Edition - Clinical: Stage IIA (T1c, N1, cM0) - Signed by Baird Cancer, PA-C on 11/23/2012  Malignant neoplasm of left female breast [facility]) Staging form: Breast, AJCC 8th Edition - Pathologic stage from 01/19/2017: Stage Unknown (pT1c, pNX, cM0, G3, ER: Negative, PR: Negative, HER2: Negative) - Signed by Baird Cancer, PA-C on 02/27/2017    INTERVAL HISTORY:  Ms. Sparkman 86 y.o. female returns for routine follow-up for breast cancer.  Patient reports she is doing well since her last visit.  She denies any new lumps or bumps present.  She denies any new bone pain. Denies any nausea, vomiting, or diarrhea. Denies any new pains. Had not noticed any recent bleeding such as epistaxis, hematuria or hematochezia. Denies recent chest pain on exertion, shortness of breath on minimal exertion, pre-syncopal episodes, or palpitations. Denies any numbness or tingling in hands or feet. Denies any recent fevers, infections, or recent hospitalizations. Patient reports appetite at 25% and energy level at 50%.     REVIEW OF SYSTEMS:  Review of Systems  Gastrointestinal: Positive for nausea.    Psychiatric/Behavioral: Positive for sleep disturbance.  All other systems reviewed and are negative.    PAST MEDICAL/SURGICAL HISTORY:  Past Medical History:  Diagnosis Date  . Acute MI ([place]) 2003  . Adenocarcinoma of left breast ([place]) 11/23/2012   Stage II (T1 C. N1 M0) adenocarcinoma of the left breast with surgery on 03/08/2001. She had a 1.5 cm cancer with 1/6 positive sentinel nodes. Reexcision margins were clear. She did participate in NSABP B.-30 randomized to Adriamycin and Taxotere for 4 cycles followed by radiation therapy from 07/13/2001 to 08/27/2001. She then started Arimidex on 07/07/2001 took that until the end of June 2008. T  . Anemia   . Bilateral cataracts   . Breast cancer ([place])   . GERD (gastroesophageal reflux disease)   . History of gout   . Hypercholesteremia   . Hypertension   . Malignant neoplasm of lower-inner quadrant of left female breast ([place])   . Osteoporosis    Past Surgical History:  Procedure Laterality Date  . ABDOMINAL HYSTERECTOMY    . BREAST SURGERY    . CATARACT EXTRACTION W/PHACO Left 09/04/2014   Procedure: CATARACT EXTRACTION PHACO AND INTRAOCULAR LENS PLACEMENT (IOC);  Surgeon: Tonny Branch, MD;  Location: [facility] ORS;  Service: Ophthalmology;  Laterality: Left;  CDE:9.84  . CATARACT EXTRACTION W/PHACO Right 11/09/2014   Procedure: CATARACT EXTRACTION PHACO AND INTRAOCULAR LENS PLACEMENT (IOC);  Surgeon: Tonny Branch, MD;  Location: [facility] OR[facility];  Service: Ophthalmology;  Laterality: Right;  CDE:8.40  . CORONARY ANGIOPLASTY     RCA stent  . ESOPHAGOGASTRODUODENOSCOPY (EGD) WITH PROPOFOL N/A 08/04/2017   Procedure: ESOPHAGOGASTRODUODENOSCOPY (EGD) WITH PROPOFOL;  Surgeon: Danie Binder,  MD;  Location: [facility] ENDO SUITE;  Service: Endoscopy;  Laterality: N/A;  7:30am  . LEFT HEART CATHETERIZATION WITH CORONARY ANGIOGRAM N/A 12/21/2012   Procedure: LEFT HEART CATHETERIZATION WITH CORONARY ANGIOGRAM;  Surgeon: Clent Demark, MD;  Location: [place] CATH LAB;   Service: Cardiovascular;  Laterality: N/A;  . MASTECTOMY MODIFIED RADICAL Left 01/16/2017   Procedure: SIMPLE MASTECTOMY;  Surgeon: Aviva Signs, MD;  Location: [facility];  Service: General;  Laterality: Left;  Marland Kitchen MASTECTOMY, PARTIAL Left   . PERCUTANEOUS STENT INTERVENTION     RCA  . PORT-A-CATH REMOVAL    . PORT-A-CATH REMOVAL Right 09/13/2018   Procedure: REMOVAL PORT-A-CATH;  Surgeon: Aviva Signs, MD;  Location: [facility] OR[facility];  Service: General;  Laterality: Right;  . PORTACATH PLACEMENT    . PORTACATH PLACEMENT Right 03/11/2017   Procedure: INSERTION PORT-A-CATH;  Surgeon: Aviva Signs, MD;  Location: [facility] OR[facility];  Service: General;  Laterality: Right;  . SAVORY DILATION N/A 08/04/2017   Procedure: SAVORY DILATION;  Surgeon: Danie Binder, MD;  Location: [facility] END[facility];  Service: Endoscopy;  Laterality: N/A;     SOCIAL HISTORY:  Social History   Socioeconomic History  . Marital status: Widowed    Spouse name: Not on file  . Number of children: Not on file  . Years of education: Not on file  . Highest education level: Not on file  Occupational History  . Not on file  Tobacco Use  . Smoking status: Current Every Day Smoker    Packs/day: 0.50    Years: 50.00    Pack years: 25.00    Types: Cigarettes  . Smokeless tobacco: Never Used  . Tobacco comment: 10 cigarettes daily  Vaping Use  . Vaping Use: Never used  Substance and Sexual Activity  . Alcohol use: No  . Drug use: No  . Sexual activity: Not Currently    Birth control/protection: Surgical  Other Topics Concern  . Not on file  Social History Narrative  . Not on file   Social Determinants of Health   Financial Resource Strain:   . Difficulty of Paying Living Expenses: Not on file  Food Insecurity:   . Worried About Charity fundraiser in the Last Year: Not on file  . Ran Out of Food in the Last Year: Not on file  Transportation Needs:   . Lack of Transportation (Medical): Not on file  . Lack of Transportation  (Non-Medical): Not on file  Physical Activity:   . Days of Exercise per Week: Not on file  . Minutes of Exercise per Session: Not on file  Stress:   . Feeling of Stress : Not on file  Social Connections:   . Frequency of Communication with Friends and Family: Not on file  . Frequency of Social Gatherings with Friends and Family: Not on file  . Attends Religious Services: Not on file  . Active Member of Clubs or Organizations: Not on file  . Attends Archivist Meetings: Not on file  . Marital Status: Not on file  Intimate Partner Violence:   . Fear of Current or Ex-Partner: Not on file  . Emotionally Abused: Not on file  . Physically Abused: Not on file  . Sexually Abused: Not on file    FAMILY HISTORY:  Family History  Problem Relation Age of Onset  . Stroke Mother   . Colon cancer Neg Hx   . Gastric cancer Neg Hx   . Esophageal cancer Neg Hx  CURRENT MEDICATIONS:  Outpatient Encounter Medications as of 09/09/2019  Medication Sig  . alclomethasone (ACLOVATE) 0.05 % cream Apply to right ear sore twice daily for two weeks.  . Artificial Tear Solution (SOOTHE XP OP) Place 1-2 drops into both eyes daily as needed (dry eye).   Marland Kitchen aspirin EC 81 MG tablet Take 81 mg by mouth daily.  Marland Kitchen atorvastatin (LIPITOR) 40 MG tablet Take 40 mg by mouth daily.   Marland Kitchen CARTIA XT 180 MG 24 hr capsule Take 180 mg by mouth daily.  . Cyanocobalamin (B-12 COMPLIANCE INJECTION) 1000 MCG/ML KIT Inject 1 Dose as directed every 30 (thirty) days.   . folic acid-vitamin b complex-vitamin c-selenium-zinc (DIALYVITE) 3 MG TABS tablet Take by mouth.  . gabapentin (NEURONTIN) 100 MG capsule Take 1 capsule (100 mg total) by mouth at bedtime.  Marland Kitchen nystatin (MYCOSTATIN/NYSTOP) powder Apply 1 application topically 3 (three) times daily.  . pantoprazole (PROTONIX) 40 MG tablet Take 1 tablet (40 mg total) by mouth 2 (two) times daily before a meal.   No facility-administered encounter medications on file as of  09/09/2019.    ALLERGIES:  Allergies  Allergen Reactions  . Azithromycin Anaphylaxis and Hives     PHYSICAL EXAM:  ECOG Performance status: 1  Vitals:   09/09/19 1209  BP: 131/71  Pulse: 70  Resp: 16  Temp: 98 F (36.7 C)  SpO2: 96%   Filed Weights   09/09/19 1209  Weight: 120 lb 14.4 oz (54.8 kg)   Physical Exam Constitutional:      Appearance: Normal appearance. She is normal weight.  Cardiovascular:     Rate and Rhythm: Normal rate and regular rhythm.     Heart sounds: Normal heart sounds.  Pulmonary:     Effort: Pulmonary effort is normal.     Breath sounds: Normal breath sounds.  Abdominal:     General: Bowel sounds are normal.     Palpations: Abdomen is soft.  Musculoskeletal:        General: Normal range of motion.  Skin:    General: Skin is warm.  Neurological:     Mental Status: She is alert and oriented to person, place, and time. Mental status is at baseline.  Psychiatric:        Mood and Affect: Mood normal.        Behavior: Behavior normal.        Thought Content: Thought content normal.        Judgment: Judgment normal.      LABORATORY DATA:  I have reviewed the labs as listed.  CBC    Component Value Date/Time   WBC 9.7 09/05/2019 1314   RBC 4.46 09/05/2019 1314   HGB 13.3 09/05/2019 1314   HCT 42.0 09/05/2019 1314   PLT 344 09/05/2019 1314   MCV 94.2 09/05/2019 1314   MCH 29.8 09/05/2019 1314   MCHC 31.7 09/05/2019 1314   RDW 12.7 09/05/2019 1314   LYMPHSABS 2.9 09/05/2019 1314   MONOABS 0.6 09/05/2019 1314   EOSABS 0.2 09/05/2019 1314   BASOSABS 0.1 09/05/2019 1314   CMP Latest Ref Rng & Units 09/05/2019 01/26/2019 07/28/2018  Glucose 70 - 99 mg/dL 110(H) 98 126(H)  BUN 8 - 23 mg/dL 10 10 10   Creatinine 0.44 - 1.00 mg/dL 0.91 0.79 0.89  Sodium 135 - 145 mmol/L 135 139 139  Potassium 3.5 - 5.1 mmol/L 3.6 3.7 3.4(L)  Chloride 98 - 111 mmol/L 104 104 105  CO2 22 - 32 mmol/L 24 27  25  Calcium 8.9 - 10.3 mg/dL 9.0 9.3 9.0  Total  Protein 6.5 - 8.1 g/dL 7.3 7.6 7.5  Total Bilirubin 0.3 - 1.2 mg/dL 0.6 0.4 0.6  Alkaline Phos 38 - 126 U/L 99 78 18(L)  AST 15 - 41 U/L 16 14(L) 16  ALT 0 - 44 U/L 10 11 11     DIAGNOSTIC IMAGING:  I have independently reviewed the hemogram scans and discussed with the patient.  ASSESSMENT & PLAN:  Malignant neoplasm of left female breast ([place]) 1.  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 Arnoldo Morale on 01/16/2017 showing a 1.8 cm primary tumor, grade 3, LVI positive, clear surgical margins, with high-grade DCIS.  Lymph nodes cannot be removed due to prior history of left breast cancer treatment. -PET scan on 03/27/2017 showed mildly hypermetabolic small prevascular lymph node and right lower peritracheal lymph node indeterminate.  Moderate metabolic activity associated with ill defined left upper lobe nodule. -PET scan on 08/15/2017 showed mild reduction in size and activity of the left and left upper lobe pulmonary nodules, favoring benign etiology.  Stable small prevascular lymph nodes and reduced size and activity of the lower pretracheal lymph node.  Overall suggest reactive/inflammatory etiology. -Adjuvant chemotherapy was not recommended as more than 7 months have elapsed since her surgery. -She reports mild tenderness at the mastectomy site which is stable.  She also reports tightness at times. -She was given a referral to remove her Port-A-Cath at her last visit. -Last mammogram done on 09/02/2019 was BI-RADS Category 1 negative. -Labs done on 09/05/2019 were all WNL. -She will follow up in 6 months with repeat labs  2.  History of node positive left breast cancer: -Diagnosed on 02/16/2001, T1 CN1M0, stage IIa, ER/PR positive, HER-2 negative, 1.5 cm, 1 out of 6 lymph nodes positive.  3.  Osteoporosis: -Last DEXA scan done on 09/02/2019 showed a T score of -3.4 -We have placed her on calcium and vitamin D twice daily.  4.  Vitamin D  deficiency: -Labs done on 09/05/2019 showed vitamin D level 24.64 -She will start taking vitamin D 5000 units daily     Orders placed this encounter:  Orders Placed This Encounter  Procedures  . CBC with Differential/Platelet  . Comprehensive metabolic panel  . Lactate dehydrogenase  . Vitamin B12  . VITAMIN D 25 Hydroxy (Vit-D Deficiency, Fractures)      Francene Finders, FNP-C [place] [phone]

## 2019-09-13 DIAGNOSIS — E7849 Other hyperlipidemia: Secondary | ICD-10-CM | POA: Diagnosis not present

## 2019-09-13 DIAGNOSIS — I2510 Atherosclerotic heart disease of native coronary artery without angina pectoris: Secondary | ICD-10-CM | POA: Diagnosis not present

## 2019-09-13 DIAGNOSIS — N1831 Chronic kidney disease, stage 3a: Secondary | ICD-10-CM | POA: Diagnosis not present

## 2019-09-13 DIAGNOSIS — I129 Hypertensive chronic kidney disease with stage 1 through stage 4 chronic kidney disease, or unspecified chronic kidney disease: Secondary | ICD-10-CM | POA: Diagnosis not present

## 2019-09-21 ENCOUNTER — Encounter: Payer: Self-pay | Admitting: *Deleted

## 2019-09-21 ENCOUNTER — Encounter: Payer: Self-pay | Admitting: Gastroenterology

## 2019-09-21 ENCOUNTER — Ambulatory Visit (INDEPENDENT_AMBULATORY_CARE_PROVIDER_SITE_OTHER): Payer: Medicare Other | Admitting: Gastroenterology

## 2019-09-21 ENCOUNTER — Other Ambulatory Visit: Payer: Self-pay

## 2019-09-21 VITALS — BP 137/71 | HR 96 | Temp 98.1°F | Ht 62.0 in | Wt 120.2 lb

## 2019-09-21 DIAGNOSIS — R7989 Other specified abnormal findings of blood chemistry: Secondary | ICD-10-CM | POA: Insufficient documentation

## 2019-09-21 DIAGNOSIS — R1110 Vomiting, unspecified: Secondary | ICD-10-CM | POA: Insufficient documentation

## 2019-09-21 DIAGNOSIS — E538 Deficiency of other specified B group vitamins: Secondary | ICD-10-CM | POA: Diagnosis not present

## 2019-09-21 DIAGNOSIS — R112 Nausea with vomiting, unspecified: Secondary | ICD-10-CM | POA: Diagnosis not present

## 2019-09-21 DIAGNOSIS — K5900 Constipation, unspecified: Secondary | ICD-10-CM | POA: Diagnosis not present

## 2019-09-21 DIAGNOSIS — R945 Abnormal results of liver function studies: Secondary | ICD-10-CM | POA: Diagnosis not present

## 2019-09-21 DIAGNOSIS — K2100 Gastro-esophageal reflux disease with esophagitis, without bleeding: Secondary | ICD-10-CM

## 2019-09-21 MED ORDER — LINACLOTIDE 72 MCG PO CAPS: 72.0000 ug | ORAL_CAPSULE | Freq: Every day | ORAL | 0 refills | Status: DC

## 2019-09-21 NOTE — Patient Instructions (Signed)
1. Your vomiting could be due to your gallbladder, constipation, or hiatal hernia.  2. Please increase your pantoprazole to 40mg  twice daily before breakfast and evening meal.  3. Add Linzess 43mcg once daily on empty stomach for constipation. I have given you samples to try. You can continue as long as needed or go back to occasional dulcolax use if that is all you need to maintain regular bowel movements.  4. Ultrasound of your gallbladder and liver as scheduled. We will contact you with results as available.

## 2019-09-21 NOTE — Progress Notes (Signed)
Primary Care Physician: Redmond School, MD  Primary Gastroenterologist:  Elon Alas. Abbey Chatters, DO   Chief Complaint  Patient presents with  . Elevated Hepatic Enzymes  . Emesis    after eating   . Gastroesophageal Reflux    HPI: Margaret Thompson is a 86 y.o. female with history of left breast cancer initially in 2003, again in 2018. She is followed by oncology.  Last seen in our office in February for follow-up of GERD, epigastric pain.  EGD from 2019 with moderate Schatzki ring status post dilation, esophagitis, medium hiatal hernia with gastritis.  No H. pylori.  Last colonoscopy in 2010 with history of tubular adenomas, patient declined further colonoscopies.  She has been on pantoprazole 40 mg once daily.  Presents today for further evaluation of abnormal LFTs.  Labs from PCP from June 2021 showed alkaline phosphatase of 142, AST 13, ALT 16, total bilirubin 0.3.  Hepatitis A IgM negative, hepatitis B surface antigen negative, hepatitis B core IgM negative, hepatitis C antibody negative.  LFTs last month were normal.  She is concerned about 1 week history of daily vomiting.  In the past she has had occasional vomiting which she related to reflux.  Currently she really denies any typical heartburn symptoms.  No weight loss.  Up about 3-5 pounds.  Yesterday when she woke up she felt fine.  Biscuit with peanut butter, coffee for breakfast. Chicken pot pie for lunch. Then for couple of hours feels sick on stomach. Threw up several times before symptoms went away and then she felt normal again.  Throat/burning in mouth right before and after vomiting. Vomiting about every day. Has been happening for about a week.  Does not seem to matter what she is eating.  Any foods can lead to vomiting.  No regular heartburn. No abdominal pain. BM one per week or little longer. Soft and a lot when finally goes.  Has not always had constipation but feels like it was related to the vomiting and getting older.  No  melena, brbpr. No ill contact. No fever.    Current Outpatient Medications  Medication Sig Dispense Refill  . alclomethasone (ACLOVATE) 0.05 % cream Apply to right ear sore twice daily for two weeks.    . Artificial Tear Solution (SOOTHE XP OP) Place 1-2 drops into both eyes daily as needed (dry eye).     Marland Kitchen aspirin EC 81 MG tablet Take 81 mg by mouth daily.    Marland Kitchen atorvastatin (LIPITOR) 40 MG tablet Take 40 mg by mouth daily.     Marland Kitchen CARTIA XT 180 MG 24 hr capsule Take 180 mg by mouth daily.    . Cyanocobalamin (B-12 COMPLIANCE INJECTION) 1000 MCG/ML KIT Inject 1 Dose as directed every 30 (thirty) days.     . folic acid-vitamin b complex-vitamin c-selenium-zinc (DIALYVITE) 3 MG TABS tablet Take by mouth.    . gabapentin (NEURONTIN) 100 MG capsule Take 1 capsule (100 mg total) by mouth at bedtime. 300 capsule 5  . nystatin (MYCOSTATIN/NYSTOP) powder Apply 1 application topically 3 (three) times daily. 15 g 0  . pantoprazole (PROTONIX) 40 MG tablet Take 1 tablet (40 mg total) by mouth 2 (two) times daily before a meal. 60 tablet 5   No current facility-administered medications for this visit.    Allergies as of 09/21/2019 - Review Complete 09/21/2019  Allergen Reaction Noted  . Azithromycin Anaphylaxis and Hives 09/15/2010    ROS:  General: Negative for anorexia, weight loss,  fever, chills, fatigue, weakness. ENT: Negative for hoarseness, difficulty swallowing , nasal congestion. CV: Negative for chest pain, angina, palpitations, dyspnea on exertion, peripheral edema.  Respiratory: Negative for dyspnea at rest, dyspnea on exertion, cough, sputum, wheezing.  GI: See history of present illness. GU:  Negative for dysuria, hematuria, urinary incontinence, urinary frequency, nocturnal urination.  Endo: Negative for unusual weight change.    Physical Examination:   BP 137/71   Pulse 96   Temp 98.1 F (36.7 C) (Temporal)   Ht _0  (1.575 m)   Wt 120 lb 3.2 oz (54.5 kg)   BMI 21.98 kg/m    General: Well-nourished, well-developed in no acute distress.  Eyes: No icterus. Mouth: masked Lungs: Clear to auscultation bilaterally.  Heart: Regular rate and rhythm, no murmurs rubs or gallops.  Abdomen: Bowel sounds are normal,   nondistended, no hepatosplenomegaly or masses, no abdominal bruits or hernia , no rebound or guarding.  Mild tenderness throughout the lower abdomen but abdomen soft.   Extremities: No lower extremity edema. No clubbing or deformities. Neuro: Alert and oriented x 4   Skin: Warm and dry, no jaundice.   Psych: Alert and cooperative, normal mood and affect.  Labs:  Lab Results  Component Value Date   CREATININE 0.91 09/05/2019   BUN 10 09/05/2019   NA 135 09/05/2019   K 3.6 09/05/2019   CL 104 09/05/2019   CO2 24 09/05/2019   Lab Results  Component Value Date   ALT 10 09/05/2019   AST 16 09/05/2019   ALKPHOS 99 09/05/2019   BILITOT 0.6 09/05/2019   Lab Results  Component Value Date   WBC 9.7 09/05/2019   HGB 13.3 09/05/2019   HCT 42.0 09/05/2019   MCV 94.2 09/05/2019   PLT 344 09/05/2019     Imaging Studies: DG Bone Density  Result Date: 09/02/2019 EXAM: DUAL X-RAY ABSORPTIOMETRY (DXA) FOR BONE MINERAL DENSITY IMPRESSION: Your patient Eleena Grater completed a BMD test on 09/02/2019 using the [employer] (software version: 14.10) manufactured by [employer]. The following summarizes the results of our evaluation. Technologist: [facility] PATIENT BIOGRAPHICAL: Name: Blake, Goya Patient ID: [id number] Birth Date: [birth year] Height: 62.0 in. Gender: Female Exam Date: 09/02/2019 Weight: 117.8 lbs. Indications: Advanced Age, Caucasian, Follow up Osteoporosis, Height Loss, History of Fracture (Adult), Hx Breast Ca, Post Menopausal, Tobacco User Fractures: Toe Treatments: Asprin, Calcium DENSITOMETRY RESULTS: Site         Region     Measured Date Measured Age WHO Classification Young Adult T-score BMD         %Change vs. Previous  Significant Change (*) DualFemur Neck Left 09/02/2019 84.3 Osteoporosis -3.2 0.597 g/cm2 -6.0% Yes DualFemur Neck Left 11/09/2017 82.5 Osteoporosis -2.9 0.635 g/cm2 1.9% - DualFemur Neck Left 07/05/2013 78.1 Osteoporosis -3.0 0.623 g/cm2 -4.6% - DualFemur Neck Left 04/07/2011 75.9 Osteoporosis -2.8 0.653 g/cm2 -5.5% Yes DualFemur Neck Left 07/30/2004 69.2 Osteoporosis -2.5 0.691 g/cm2 -3.5% - DualFemur Neck Left 07/11/2002 67.1 Osteopenia -2.3 0.716 g/cm2 -2.2% - DualFemur Neck Left 07/08/2001 66.1 Osteopenia -2.2 0.732 g/cm2 - - DualFemur Total Mean 09/02/2019 84.3 Osteoporosis -3.0 0.625 g/cm2 -3.5% Yes DualFemur Total Mean 11/09/2017 82.5 Osteoporosis -2.9 0.648 g/cm2 -1.7% - DualFemur Total Mean 07/05/2013 78.1 Osteoporosis -2.8 0.659 g/cm2 -3.8% Yes DualFemur Total Mean 04/07/2011 75.9 Osteoporosis -2.6 0.685 g/cm2 -4.9% Yes DualFemur Total Mean 07/30/2004 69.2 Osteopenia -2.3 0.720 g/cm2 -4.4% Yes DualFemur Total Mean 07/11/2002 67.1 Osteopenia -2.0 0.753 g/cm2 -0.4% - DualFemur Total Mean 07/08/2001  66.1 Osteopenia -2.0 0.756 g/cm2 - - Left Forearm Radius 33% 09/02/2019 84.3 Osteoporosis -3.4 0.469 g/cm2 1.9% - Left Forearm Radius 33% 11/09/2017 82.5 Osteoporosis -3.6 0.460 g/cm2 - - ASSESSMENT: The BMD measured at Forearm Radius 33% is 0.469 g/cm2 with a T-score of -3.4. This patient is considered osteoporotic according to [facility]) criteria. The scan quality is good. Compared with the prior study on 11/09/17, the BMD of the total mean shows a statistically significant decrease. Lumbar spine was excluded due to advanced degenerative changes. World Pharmacologist [facility]) criteria for post-menopausal, Caucasian Women: Normal:       T-score at or above -1 SD Osteopenia:   T-score between -1 and -2.5 SD Osteoporosis: T-score at or below -2.5 SD RECOMMENDATIONS: 1. All patients should optimize calcium and vitamin D intake. 2. Consider FDA-approved medical therapies in postmenopausal women  and med aged 74 years and older, based on the following: a. A hip or vertebral (clinical or morphometric) fracture b. T-score< -2.5 at the femoral neck or spine after appropriate evaluation to exclude secondary causes c. Low bone mass (T-score between -1.0 and -2.5 at the femoral neck or spine) and a 10-year probability of a hip fracture > 3% or a 10-year probability of a major osteoporosis-related fracture > 20% based on the [place]-adapted WHO algorithm d. Clinician judgment and/or patient preferences may indicate treatment for people with 10-year fracture probabilities above or below these levels FOLLOW-UP: People with diagnosed cases of osteoporosis or at high risk for fracture should have regular bone mineral density tests. For patients eligible for Medicare, routine testing is allowed once every 2 years. The testing frequency can be increased to one year for patients who have rapidly progressing disease, those who are receiving or discontinuing medical therapy to restore bone mass, or have additional risk factors. I have reviewed this report, and agree with the above findings. [facility] Electronically Signed   By: Rolm Baptise M.D.   On: 09/02/2019 15:45   MM 3D SCREEN BREAST UNI RIGHT  Result Date: 09/05/2019 CLINICAL DATA:  Screening. EXAM: DIGITAL SCREENING UNILATERAL RIGHT MAMMOGRAM WITH CAD AND TOMO COMPARISON:  Previous exam(s). ACR Breast Density Category b: There are scattered areas of fibroglandular density. FINDINGS: The patient has had a left mastectomy. There are no findings suspicious for malignancy. Images were processed with CAD. IMPRESSION: No mammographic evidence of malignancy. A result letter of this screening mammogram will be mailed directly to the patient. RECOMMENDATION: Screening mammogram in one year.  (Code:SM-R-35M) BI-RADS CATEGORY  1: Negative. Electronically Signed   By: Audie Pinto M.D.   On: 09/05/2019 12:10     Impression/plan:  86 year old female  with chronic GERD, moderate sized hiatal hernia, history of Schatzki ring presents for further evaluation of abnormal LFTs in 1 week history of vomiting.  Recently had mildly elevated alkaline phosphatase with normal transaminases and total bilirubin.  Viral markers were negative.  Repeat LFTs were normal.  Gallbladder remains in situ.  For the past 1 week she has had postprandial vomiting on a daily basis.  Has been able to keep some meals daily.  Would be reasonable to pursue right upper quadrant ultrasound in the near future.  She has persistent vomiting, develops abdominal pain or fever, she should let us know.  Postprandial vomiting, cannot exclude biliary component but moderate sized hiatal hernia constipation may be contributing.  Will increase pantoprazole to 40 mg twice daily before meals.  Add Linzess 72 mcg daily for constipation.  Samples provided.  When she has regular BMs, she can back off on Linzess and use Dulcolax on occasion.  She will let us know if she has persistent symptoms.  Further recommendations pending ultrasound findings.

## 2019-09-26 ENCOUNTER — Ambulatory Visit (HOSPITAL_COMMUNITY)
Admission: RE | Admit: 2019-09-26 | Discharge: 2019-09-26 | Disposition: A | Discharge status: Home / Self Care | Payer: Medicare Other | Source: Ambulatory Visit | Attending: Gastroenterology | Admitting: Gastroenterology

## 2019-09-26 ENCOUNTER — Other Ambulatory Visit: Payer: Self-pay

## 2019-09-26 DIAGNOSIS — R945 Abnormal results of liver function studies: Secondary | ICD-10-CM | POA: Diagnosis not present

## 2019-09-26 DIAGNOSIS — K8020 Calculus of gallbladder without cholecystitis without obstruction: Secondary | ICD-10-CM | POA: Diagnosis not present

## 2019-09-26 DIAGNOSIS — K2100 Gastro-esophageal reflux disease with esophagitis, without bleeding: Secondary | ICD-10-CM

## 2019-09-26 DIAGNOSIS — R112 Nausea with vomiting, unspecified: Secondary | ICD-10-CM

## 2019-09-26 DIAGNOSIS — K5900 Constipation, unspecified: Secondary | ICD-10-CM | POA: Diagnosis not present

## 2019-09-26 DIAGNOSIS — R7989 Other specified abnormal findings of blood chemistry: Secondary | ICD-10-CM

## 2019-10-06 ENCOUNTER — Telehealth: Payer: Self-pay | Admitting: Internal Medicine

## 2019-10-06 NOTE — Telephone Encounter (Signed)
Pt called back to inquire about her Oct appt.  Informed her that it had been cancelled since she was seen sooner.  Pt voiced understanding and was encouraged to call us if any future problems.

## 2019-10-06 NOTE — Telephone Encounter (Signed)
PATIENT CALLED WANTING TO KNOW WHEN SHE NEEDED TO BE SEEN FOR AN OFFICE VISIT

## 2019-10-26 DIAGNOSIS — N342 Other urethritis: Secondary | ICD-10-CM | POA: Diagnosis not present

## 2019-10-26 DIAGNOSIS — Z6820 Body mass index (BMI) 20.0-20.9, adult: Secondary | ICD-10-CM | POA: Diagnosis not present

## 2019-10-26 DIAGNOSIS — R051 Acute cough: Secondary | ICD-10-CM | POA: Diagnosis not present

## 2019-11-03 DIAGNOSIS — J45909 Unspecified asthma, uncomplicated: Secondary | ICD-10-CM | POA: Diagnosis not present

## 2019-11-03 DIAGNOSIS — E785 Hyperlipidemia, unspecified: Secondary | ICD-10-CM | POA: Diagnosis not present

## 2019-11-03 DIAGNOSIS — I2510 Atherosclerotic heart disease of native coronary artery without angina pectoris: Secondary | ICD-10-CM | POA: Diagnosis not present

## 2019-11-03 DIAGNOSIS — I10 Essential (primary) hypertension: Secondary | ICD-10-CM | POA: Diagnosis not present

## 2019-11-03 DIAGNOSIS — F17290 Nicotine dependence, other tobacco product, uncomplicated: Secondary | ICD-10-CM | POA: Diagnosis not present

## 2019-11-04 DIAGNOSIS — Z23 Encounter for immunization: Secondary | ICD-10-CM | POA: Diagnosis not present

## 2019-11-09 ENCOUNTER — Ambulatory Visit: Payer: Medicare Other | Admitting: Gastroenterology

## 2019-11-12 DIAGNOSIS — N1831 Chronic kidney disease, stage 3a: Secondary | ICD-10-CM | POA: Diagnosis not present

## 2019-11-12 DIAGNOSIS — I2510 Atherosclerotic heart disease of native coronary artery without angina pectoris: Secondary | ICD-10-CM | POA: Diagnosis not present

## 2019-11-12 DIAGNOSIS — I129 Hypertensive chronic kidney disease with stage 1 through stage 4 chronic kidney disease, or unspecified chronic kidney disease: Secondary | ICD-10-CM | POA: Diagnosis not present

## 2019-11-12 DIAGNOSIS — E7849 Other hyperlipidemia: Secondary | ICD-10-CM | POA: Diagnosis not present

## 2019-11-22 DIAGNOSIS — R69 Illness, unspecified: Secondary | ICD-10-CM | POA: Diagnosis not present

## 2019-12-05 IMAGING — MG DIGITAL SCREENING UNILATERAL RIGHT MAMMOGRAM WITH CAD AND TOMO
3 series · 3 of 11 positions shown · non-contrast
Comparison: Previous exam(s).

CLINICAL DATA: Screening.

EXAM:
DIGITAL SCREENING UNILATERAL RIGHT MAMMOGRAM WITH CAD AND TOMO

[R CC synth-2D]
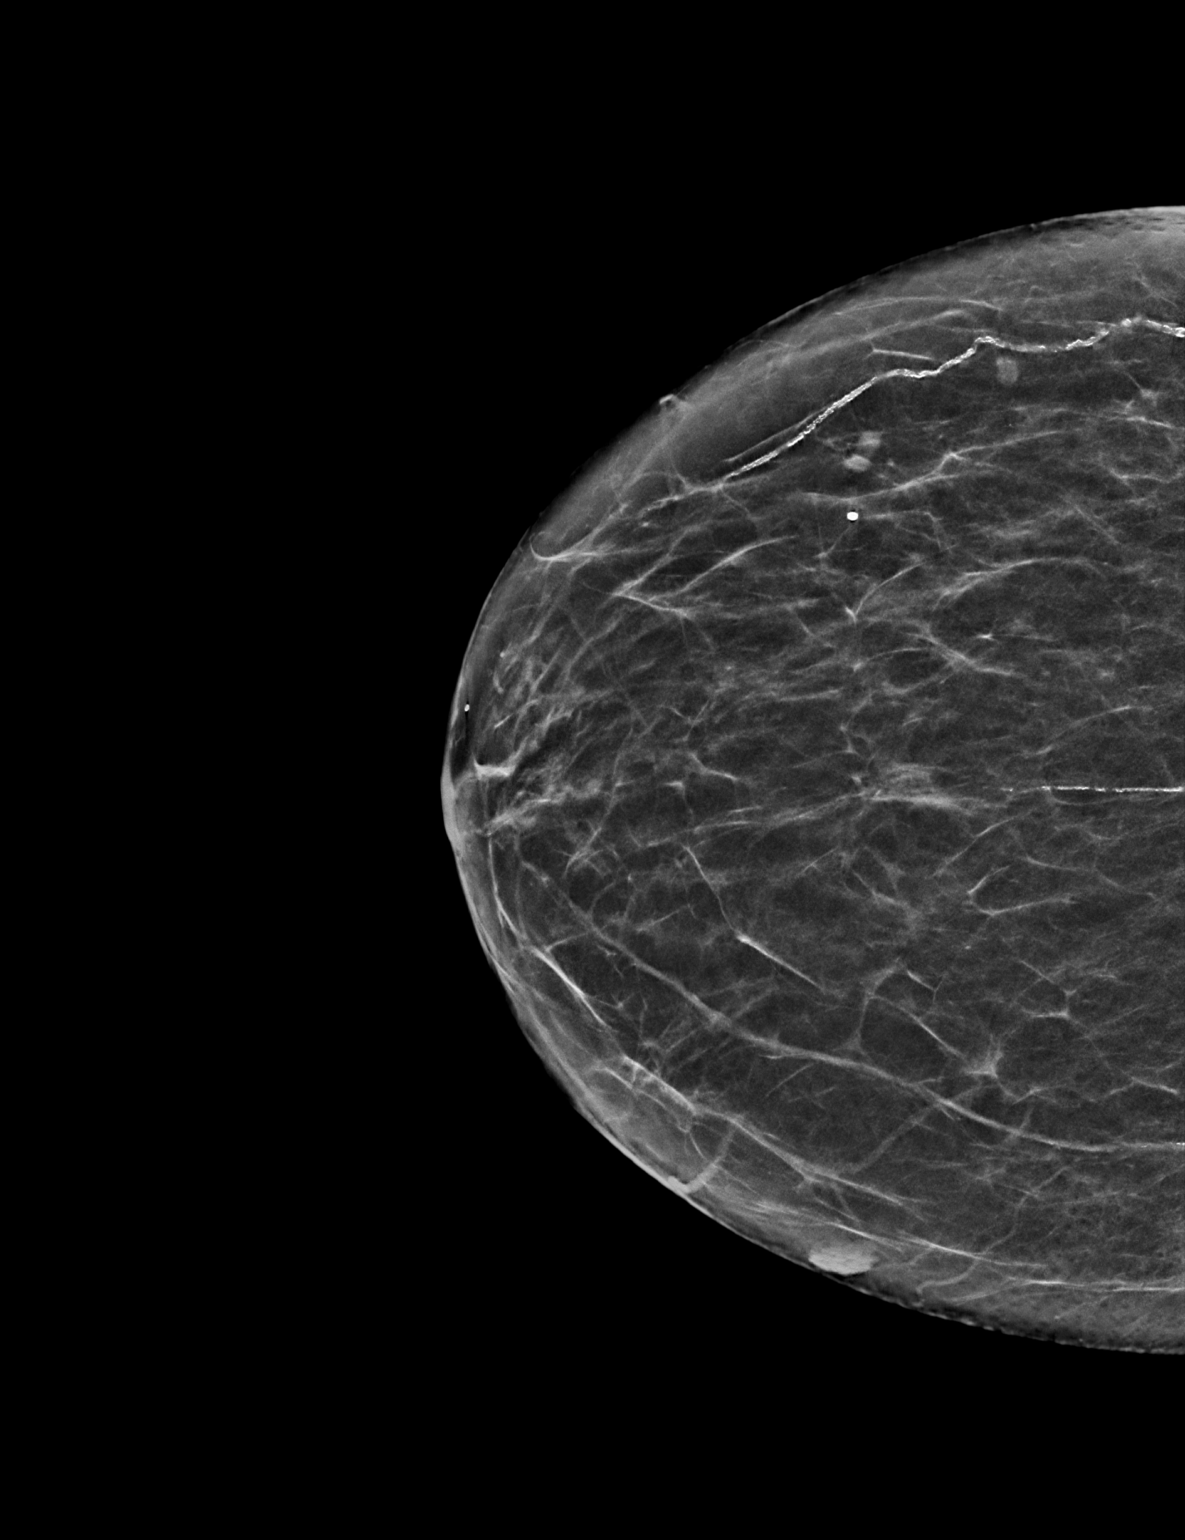

[R CC tomo · tomo slice 26/51.0]
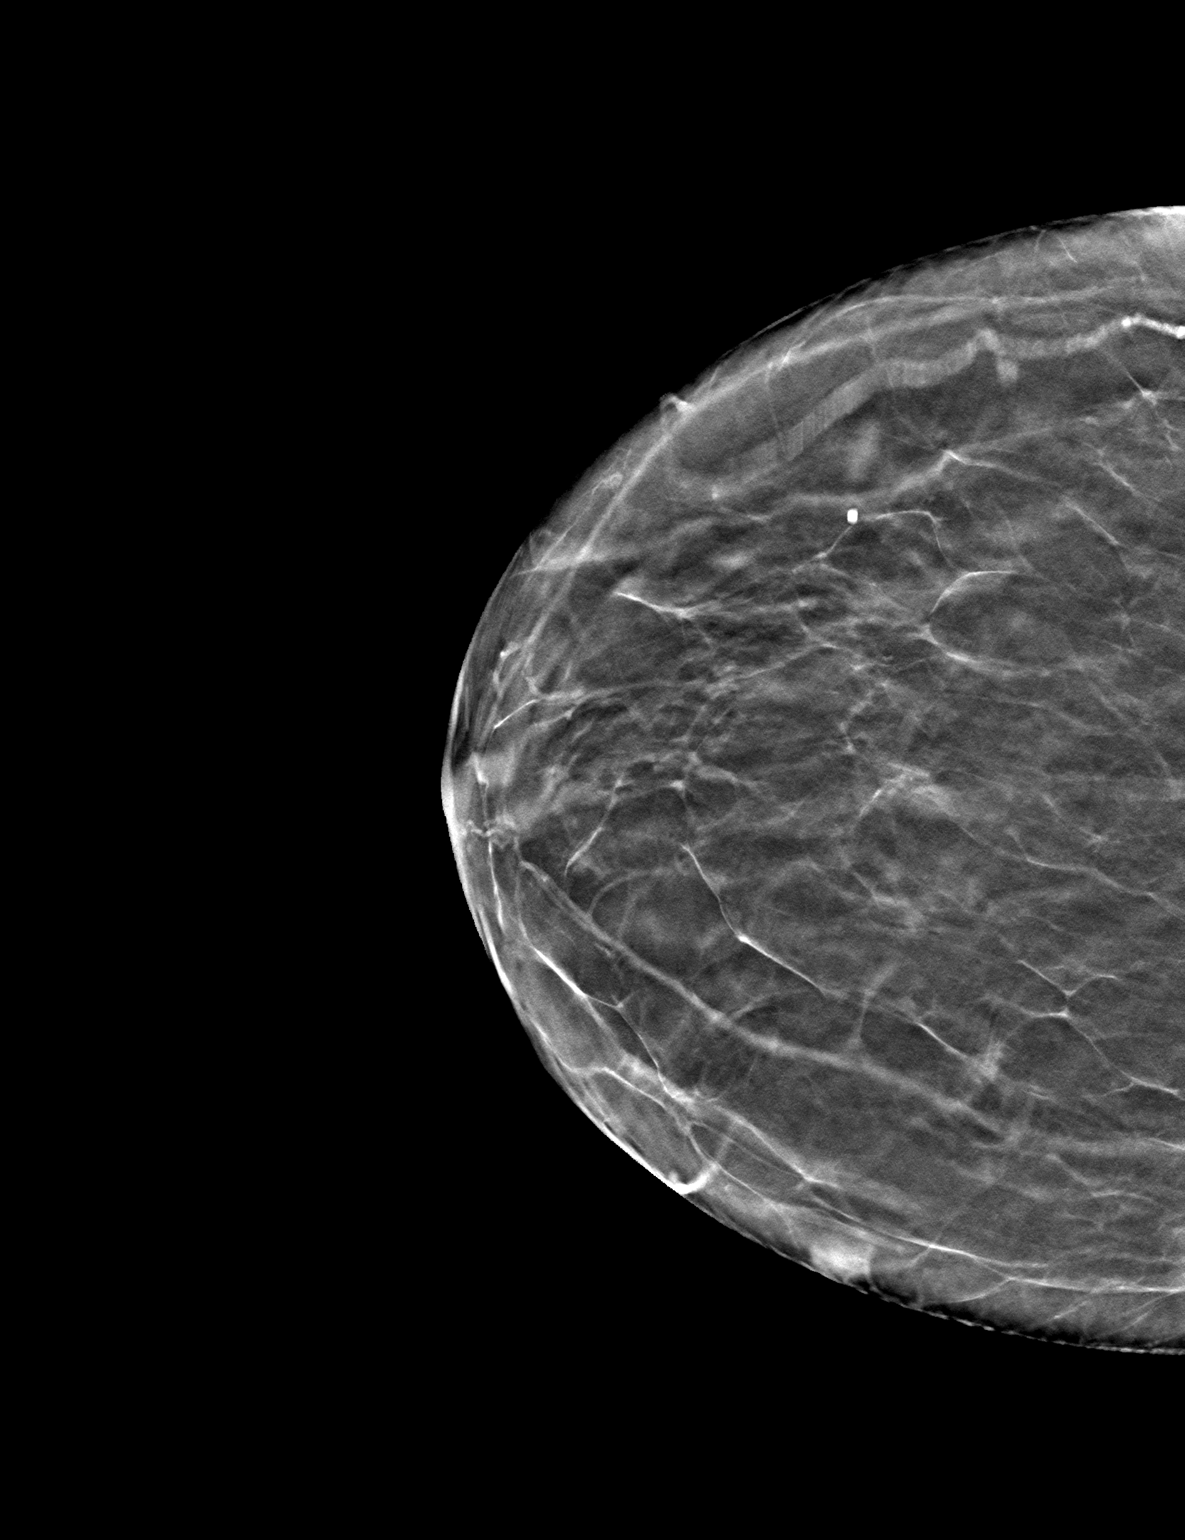

[R MLO tomo · tomo slice 27/53.0]
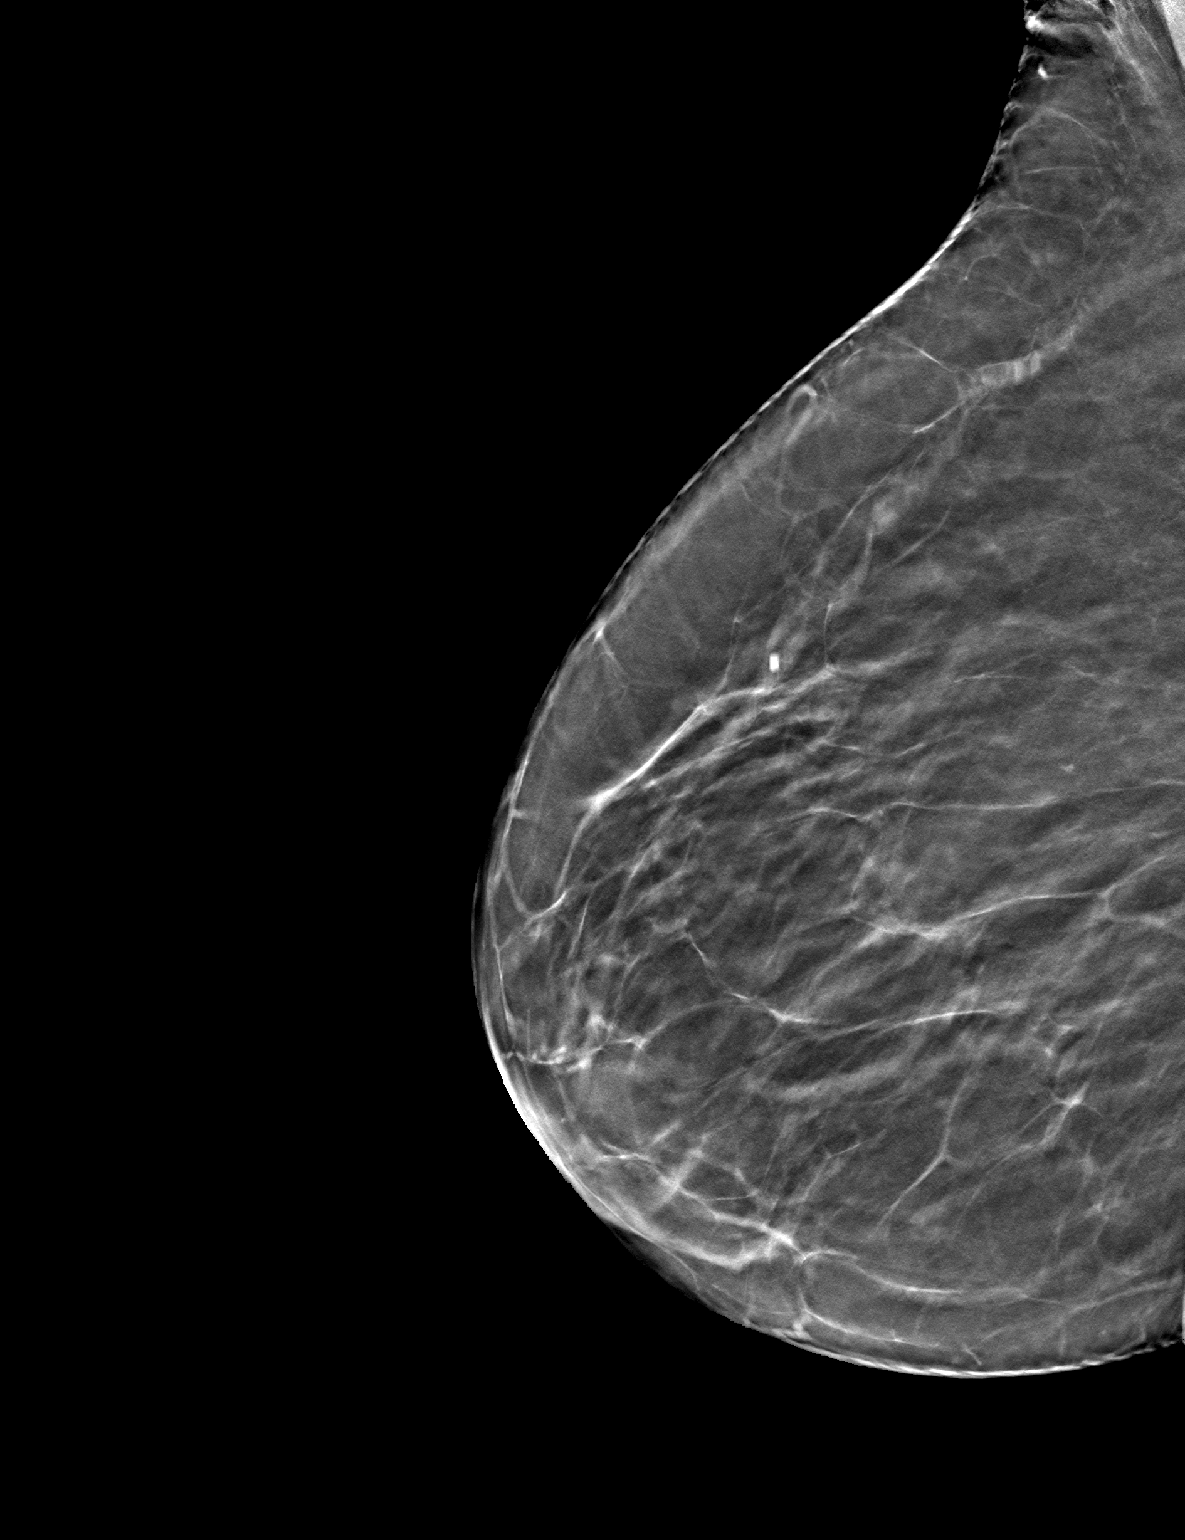

[3 of 11 positions shown; findings below may reference images not displayed]

ACR Breast Density Category b: There are scattered areas of
fibroglandular density.
FINDINGS: The patient has had a left mastectomy. There are no findings
suspicious for malignancy. Images were processed with CAD.
IMPRESSION: No mammographic evidence of malignancy. A result letter of this
screening mammogram will be mailed directly to the patient.

RECOMMENDATION:
Screening mammogram in one year.  (Code:H3-9-9K3)

BI-RADS CATEGORY  1: Negative.

## 2019-12-13 DIAGNOSIS — N1831 Chronic kidney disease, stage 3a: Secondary | ICD-10-CM | POA: Diagnosis not present

## 2019-12-13 DIAGNOSIS — E7849 Other hyperlipidemia: Secondary | ICD-10-CM | POA: Diagnosis not present

## 2019-12-13 DIAGNOSIS — I129 Hypertensive chronic kidney disease with stage 1 through stage 4 chronic kidney disease, or unspecified chronic kidney disease: Secondary | ICD-10-CM | POA: Diagnosis not present

## 2019-12-13 DIAGNOSIS — I2510 Atherosclerotic heart disease of native coronary artery without angina pectoris: Secondary | ICD-10-CM | POA: Diagnosis not present

## 2019-12-21 ENCOUNTER — Other Ambulatory Visit: Payer: Self-pay

## 2019-12-21 ENCOUNTER — Encounter: Payer: Self-pay | Admitting: Internal Medicine

## 2019-12-21 ENCOUNTER — Ambulatory Visit: Payer: Medicare Other | Admitting: Internal Medicine

## 2019-12-21 VITALS — BP 126/72 | HR 91 | Temp 97.1°F | Ht 62.0 in | Wt 121.0 lb

## 2019-12-21 DIAGNOSIS — B370 Candidal stomatitis: Secondary | ICD-10-CM | POA: Insufficient documentation

## 2019-12-21 DIAGNOSIS — R112 Nausea with vomiting, unspecified: Secondary | ICD-10-CM | POA: Diagnosis not present

## 2019-12-21 DIAGNOSIS — K2100 Gastro-esophageal reflux disease with esophagitis, without bleeding: Secondary | ICD-10-CM | POA: Diagnosis not present

## 2019-12-21 MED ORDER — MAGIC MOUTHWASH W/LIDOCAINE: 15.0000 mL | Freq: Three times a day (TID) | ORAL | 0 refills | Status: AC

## 2019-12-21 NOTE — Patient Instructions (Signed)
I will give you samples of Dexilant 60 mg daily.  I want you to take this 30 minutes before breakfast.  Do not take your pantoprazole while on this medication.  I will also print you a prescription for Magic mouthwash.  I want you to use this 3 times daily.  Follow-up in 6 weeks or sooner if needed  At [facility] we value your feedback. You may receive a survey about your visit today. Please share your experience as we strive to create trusting relationships with our patients to provide genuine, compassionate, quality care.  We appreciate your understanding and patience as we review any laboratory studies, imaging, and other diagnostic tests that are ordered as we care for you. Our office policy is 5 business days for review of these results, and any emergent or urgent results are addressed in a timely manner for your best interest. If you do not hear from our office in 1 week, please contact us.   We also encourage the use of MyChart, which contains your medical information for your review as well. If you are not enrolled in this feature, an access code is on this after visit summary for your convenience. Thank you for allowing [place] to be involved in your care.  It was great to see you today!  I hope you have a great rest of your winter!!    Elon Alas. Abbey Chatters, D.O. Gastroenterology and Hepatology [facility]

## 2019-12-21 NOTE — Progress Notes (Signed)
Referring Provider: Redmond School, MD Primary Care Physician:  Redmond School, MD Primary GI:  Dr. Abbey Chatters  Chief Complaint  Patient presents with  . Follow-up    throat burns in the evening, vomiting after eating    HPI:   Margaret Thompson is a 86 y.o. female who presents to clinic today for follow-up visit.  Previously seen for elevated alk phos.  Right upper quadrant ultrasound showed cholelithiasis without cholecystitis.  Patient states she is no longer having abdominal pain.  She was offered surgical consultation on previous visit and she declines.  Her main complaint for me today is new onset of what she describes as "throat burning."  States this started 2 weeks ago.  States every evening meal she has she will immediately get severe burning in her throat and then proceeded to vomit.  Currently on Protonix and does not feel like this is helping her.  No dysphagia or odynophagia.  Past Medical History:  Diagnosis Date  . Acute MI ([place]) 2003  . Adenocarcinoma of left breast ([place]) 11/23/2012   Stage II (T1 C. N1 M0) adenocarcinoma of the left breast with surgery on 03/08/2001. She had a 1.5 cm cancer with 1/6 positive sentinel nodes. Reexcision margins were clear. She did participate in NSABP B.-30 randomized to Adriamycin and Taxotere for 4 cycles followed by radiation therapy from 07/13/2001 to 08/27/2001. She then started Arimidex on 07/07/2001 took that until the end of June 2008. T  . Anemia   . Bilateral cataracts   . Breast cancer ([place])   . GERD (gastroesophageal reflux disease)   . History of gout   . Hypercholesteremia   . Hypertension   . Malignant neoplasm of lower-inner quadrant of left female breast ([place])   . Osteoporosis     Past Surgical History:  Procedure Laterality Date  . ABDOMINAL HYSTERECTOMY    . BREAST SURGERY    . CATARACT EXTRACTION W/PHACO Left 09/04/2014   Procedure: CATARACT EXTRACTION PHACO AND INTRAOCULAR LENS PLACEMENT (IOC);  Surgeon: Tonny Branch, MD;  Location: [facility];  Service: Ophthalmology;  Laterality: Left;  CDE:9.84  . CATARACT EXTRACTION W/PHACO Right 11/09/2014   Procedure: CATARACT EXTRACTION PHACO AND INTRAOCULAR LENS PLACEMENT (IOC);  Surgeon: Tonny Branch, MD;  Location: [facility];  Service: Ophthalmology;  Laterality: Right;  CDE:8.40  . CORONARY ANGIOPLASTY     RCA stent  . ESOPHAGOGASTRODUODENOSCOPY (EGD) WITH PROPOFOL N/A 08/04/2017   Fields: esophagitis, gastritis, no h.pylori. medium sized hiatal hernia, moderate schatzki ring s/p dilation  . LEFT HEART CATHETERIZATION WITH CORONARY ANGIOGRAM N/A 12/21/2012   Procedure: LEFT HEART CATHETERIZATION WITH CORONARY ANGIOGRAM;  Surgeon: Clent Demark, MD;  Location: [facility];  Service: Cardiovascular;  Laterality: N/A;  . MASTECTOMY MODIFIED RADICAL Left 01/16/2017   Procedure: SIMPLE MASTECTOMY;  Surgeon: Aviva Signs, MD;  Location: [facility];  Service: General;  Laterality: Left;  Marland Kitchen MASTECTOMY, PARTIAL Left   . PERCUTANEOUS STENT INTERVENTION     RCA  . PORT-A-CATH REMOVAL    . PORT-A-CATH REMOVAL Right 09/13/2018   Procedure: REMOVAL PORT-A-CATH;  Surgeon: Aviva Signs, MD;  Location: [facility];  Service: General;  Laterality: Right;  . PORTACATH PLACEMENT    . PORTACATH PLACEMENT Right 03/11/2017   Procedure: INSERTION PORT-A-CATH;  Surgeon: Aviva Signs, MD;  Location: [facility];  Service: General;  Laterality: Right;  . SAVORY DILATION N/A 08/04/2017   Procedure: SAVORY DILATION;  Surgeon: Danie Binder, MD;  [facility]: [facility];  Service:  Endoscopy;  Laterality: N/A;    Current Outpatient Medications  Medication Sig Dispense Refill  . Artificial Tear Solution (SOOTHE XP OP) Place 1-2 drops into both eyes daily as needed (dry eye).     Marland Kitchen aspirin EC 81 MG tablet Take 81 mg by mouth daily.    Marland Kitchen atorvastatin (LIPITOR) 40 MG tablet Take 40 mg by mouth daily.     . Cyanocobalamin (B-12 COMPLIANCE INJECTION) 1000 MCG/ML KIT Inject 1 Dose as directed every 30 (thirty)  days.     Marland Kitchen diltiazem (TIAZAC) 180 MG 24 hr capsule Take 180 mg by mouth daily.    Marland Kitchen linaclotide (LINZESS) 72 MCG capsule Take 1 capsule (72 mcg total) by mouth daily before breakfast. (Patient taking differently: Take 72 mcg by mouth as needed. ) 12 capsule 0  . pantoprazole (PROTONIX) 40 MG tablet Take 1 tablet (40 mg total) by mouth 2 (two) times daily before a meal. (Patient taking differently: Take 40 mg by mouth as needed. ) 60 tablet 5  . alclomethasone (ACLOVATE) 0.05 % cream Apply to right ear sore twice daily for two weeks. (Patient not taking: Reported on 12/21/2019)    . magic mouthwash w/lidocaine SOLN Take 15 mLs by mouth 3 (three) times daily for 14 days. 630 mL 0   No current facility-administered medications for this visit.    Allergies as of 12/21/2019 - Review Complete 12/21/2019  Allergen Reaction Noted  . Azithromycin Anaphylaxis and Hives 09/15/2010    Family History  Problem Relation Age of Onset  . Stroke Mother   . Colon cancer Neg Hx   . Gastric cancer Neg Hx   . Esophageal cancer Neg Hx     Social History   Socioeconomic History  . Marital status: Widowed    Spouse name: Not on file  . Number of children: Not on file  . Years of education: Not on file  . Highest education level: Not on file  Occupational History  . Not on file  Tobacco Use  . Smoking status: Current Every Day Smoker    Packs/day: 0.50    Years: 50.00    Pack years: 25.00    Types: Cigarettes  . Smokeless tobacco: Never Used  . Tobacco comment: 10 cigarettes daily  Vaping Use  . Vaping Use: Never used  Substance and Sexual Activity  . Alcohol use: No  . Drug use: No  . Sexual activity: Not Currently    Birth control/protection: Surgical  Other Topics Concern  . Not on file  Social History Narrative  . Not on file   Social Determinants of Health   Financial Resource Strain:   . Difficulty of Paying Living Expenses: Not on file  Food Insecurity:   . Worried About  Charity fundraiser in the Last Year: Not on file  . Ran Out of Food in the Last Year: Not on file  Transportation Needs:   . Lack of Transportation (Medical): Not on file  . Lack of Transportation (Non-Medical): Not on file  Physical Activity:   . Days of Exercise per Week: Not on file  . Minutes of Exercise per Session: Not on file  Stress:   . Feeling of Stress : Not on file  Social Connections:   . Frequency of Communication with Friends and Family: Not on file  . Frequency of Social Gatherings with Friends and Family: Not on file  . Attends Religious Services: Not on file  . Active Member of Clubs or Organizations:  Not on file  . Attends Archivist Meetings: Not on file  . Marital Status: Not on file    Subjective: Review of Systems  Constitutional: Negative for chills and fever.  HENT: Negative for congestion and hearing loss.   Eyes: Negative for blurred vision and double vision.  Respiratory: Negative for cough and shortness of breath.   Cardiovascular: Negative for chest pain and palpitations.  Gastrointestinal: Positive for heartburn and nausea. Negative for abdominal pain, blood in stool, constipation, diarrhea, melena and vomiting.  Genitourinary: Negative for dysuria and urgency.  Musculoskeletal: Negative for joint pain and myalgias.  Skin: Negative for itching and rash.  Neurological: Negative for dizziness and headaches.  Psychiatric/Behavioral: Negative for depression. The patient is not nervous/anxious.      Objective: BP 126/72   Pulse 91   Temp (!) 97.1 F (36.2 C) (Temporal)   Ht 5' 2" (1.575 m)   Wt 121 lb (54.9 kg)   BMI 22.13 kg/m  Physical Exam Constitutional:      Appearance: Normal appearance.  HENT:     Head: Normocephalic and atraumatic.     Mouth/Throat:     Comments: Mild amount of thrush noted in the posterior oropharynx Eyes:     Extraocular Movements: Extraocular movements intact.     Conjunctiva/sclera: Conjunctivae  normal.  Cardiovascular:     Rate and Rhythm: Normal rate and regular rhythm.  Pulmonary:     Effort: Pulmonary effort is normal.     Breath sounds: Normal breath sounds.  Abdominal:     General: Bowel sounds are normal.     Palpations: Abdomen is soft.  Musculoskeletal:        General: No swelling. Normal range of motion.     Cervical back: Normal range of motion and neck supple.  Skin:    General: Skin is warm and dry.     Coloration: Skin is not jaundiced.  Neurological:     General: No focal deficit present.     Mental Status: She is alert and oriented to person, place, and time.  Psychiatric:        Mood and Affect: Mood normal.        Behavior: Behavior normal.      Assessment: *GERD-uncontrolled *Oral thrush *Throat burning *Nausea  Plan: Patient's chronic GERD does not appear to be well controlled on pantoprazole.  I will trial her on Dexilant 60 mg daily.  Samples provided today.  If this is helping she will call office for formal prescription.  For her new onset throat burning, likely this is oral thrush.  We will  print prescription for for Magic mouthwash.  Patient is told to use this 3 times daily.  Follow-up in 6 weeks or sooner if needed.  12/21/2019 3:53 PM   Disclaimer: This note was dictated with voice recognition software. Similar sounding words can inadvertently be transcribed and may not be corrected upon review.

## 2019-12-23 DIAGNOSIS — E538 Deficiency of other specified B group vitamins: Secondary | ICD-10-CM | POA: Diagnosis not present

## 2020-01-12 DIAGNOSIS — J449 Chronic obstructive pulmonary disease, unspecified: Secondary | ICD-10-CM | POA: Diagnosis not present

## 2020-01-12 DIAGNOSIS — M25551 Pain in right hip: Secondary | ICD-10-CM | POA: Diagnosis not present

## 2020-01-12 DIAGNOSIS — W19XXXA Unspecified fall, initial encounter: Secondary | ICD-10-CM | POA: Diagnosis not present

## 2020-01-12 DIAGNOSIS — Z681 Body mass index (BMI) 19 or less, adult: Secondary | ICD-10-CM | POA: Diagnosis not present

## 2020-01-12 DIAGNOSIS — S0990XA Unspecified injury of head, initial encounter: Secondary | ICD-10-CM | POA: Diagnosis not present

## 2020-01-13 DIAGNOSIS — I2510 Atherosclerotic heart disease of native coronary artery without angina pectoris: Secondary | ICD-10-CM | POA: Diagnosis not present

## 2020-01-13 DIAGNOSIS — N1831 Chronic kidney disease, stage 3a: Secondary | ICD-10-CM | POA: Diagnosis not present

## 2020-01-13 DIAGNOSIS — I129 Hypertensive chronic kidney disease with stage 1 through stage 4 chronic kidney disease, or unspecified chronic kidney disease: Secondary | ICD-10-CM | POA: Diagnosis not present

## 2020-01-13 DIAGNOSIS — E7849 Other hyperlipidemia: Secondary | ICD-10-CM | POA: Diagnosis not present

## 2020-01-19 ENCOUNTER — Other Ambulatory Visit (HOSPITAL_COMMUNITY): Payer: Self-pay | Admitting: Family Medicine

## 2020-01-19 ENCOUNTER — Other Ambulatory Visit: Payer: Self-pay

## 2020-01-19 ENCOUNTER — Ambulatory Visit (HOSPITAL_COMMUNITY)
Admission: RE | Admit: 2020-01-19 | Discharge: 2020-01-19 | Disposition: A | Discharge status: Home / Self Care | Payer: Medicare Other | Source: Ambulatory Visit | Attending: Family Medicine | Admitting: Family Medicine

## 2020-01-19 DIAGNOSIS — R079 Chest pain, unspecified: Secondary | ICD-10-CM

## 2020-01-20 ENCOUNTER — Other Ambulatory Visit (HOSPITAL_COMMUNITY): Payer: Self-pay | Admitting: Family Medicine

## 2020-01-20 DIAGNOSIS — R079 Chest pain, unspecified: Secondary | ICD-10-CM

## 2020-01-23 ENCOUNTER — Ambulatory Visit (HOSPITAL_COMMUNITY)
Admission: RE | Admit: 2020-01-23 | Discharge: 2020-01-23 | Disposition: A | Discharge status: Home / Self Care | Payer: Medicare Other | Source: Ambulatory Visit | Attending: Family Medicine | Admitting: Family Medicine

## 2020-01-23 ENCOUNTER — Other Ambulatory Visit: Payer: Self-pay

## 2020-01-23 ENCOUNTER — Other Ambulatory Visit (HOSPITAL_COMMUNITY): Payer: Self-pay | Admitting: Family Medicine

## 2020-01-23 DIAGNOSIS — N182 Chronic kidney disease, stage 2 (mild): Secondary | ICD-10-CM | POA: Diagnosis not present

## 2020-01-23 DIAGNOSIS — Z681 Body mass index (BMI) 19 or less, adult: Secondary | ICD-10-CM | POA: Diagnosis not present

## 2020-01-23 DIAGNOSIS — F17290 Nicotine dependence, other tobacco product, uncomplicated: Secondary | ICD-10-CM | POA: Diagnosis not present

## 2020-01-23 DIAGNOSIS — R079 Chest pain, unspecified: Secondary | ICD-10-CM | POA: Diagnosis not present

## 2020-01-23 DIAGNOSIS — I700 Atherosclerosis of aorta: Secondary | ICD-10-CM | POA: Diagnosis not present

## 2020-01-23 DIAGNOSIS — M5481 Occipital neuralgia: Secondary | ICD-10-CM | POA: Diagnosis not present

## 2020-01-23 DIAGNOSIS — R0781 Pleurodynia: Secondary | ICD-10-CM | POA: Diagnosis not present

## 2020-01-23 DIAGNOSIS — J449 Chronic obstructive pulmonary disease, unspecified: Secondary | ICD-10-CM | POA: Diagnosis not present

## 2020-01-31 DIAGNOSIS — M25522 Pain in left elbow: Secondary | ICD-10-CM | POA: Diagnosis not present

## 2020-01-31 DIAGNOSIS — S42202A Unspecified fracture of upper end of left humerus, initial encounter for closed fracture: Secondary | ICD-10-CM | POA: Diagnosis not present

## 2020-01-31 DIAGNOSIS — M25512 Pain in left shoulder: Secondary | ICD-10-CM | POA: Diagnosis not present

## 2020-02-03 ENCOUNTER — Other Ambulatory Visit (HOSPITAL_COMMUNITY): Payer: Self-pay | Admitting: Family Medicine

## 2020-02-03 ENCOUNTER — Other Ambulatory Visit: Payer: Self-pay | Admitting: Family Medicine

## 2020-02-03 DIAGNOSIS — R911 Solitary pulmonary nodule: Secondary | ICD-10-CM

## 2020-02-08 ENCOUNTER — Ambulatory Visit: Payer: Medicare Other | Admitting: Gastroenterology

## 2020-02-10 DIAGNOSIS — S42202D Unspecified fracture of upper end of left humerus, subsequent encounter for fracture with routine healing: Secondary | ICD-10-CM | POA: Diagnosis not present

## 2020-02-11 DIAGNOSIS — I2510 Atherosclerotic heart disease of native coronary artery without angina pectoris: Secondary | ICD-10-CM | POA: Diagnosis not present

## 2020-02-11 DIAGNOSIS — N1831 Chronic kidney disease, stage 3a: Secondary | ICD-10-CM | POA: Diagnosis not present

## 2020-02-11 DIAGNOSIS — I129 Hypertensive chronic kidney disease with stage 1 through stage 4 chronic kidney disease, or unspecified chronic kidney disease: Secondary | ICD-10-CM | POA: Diagnosis not present

## 2020-02-11 DIAGNOSIS — E7849 Other hyperlipidemia: Secondary | ICD-10-CM | POA: Diagnosis not present

## 2020-02-16 DIAGNOSIS — F17290 Nicotine dependence, other tobacco product, uncomplicated: Secondary | ICD-10-CM | POA: Diagnosis not present

## 2020-02-16 DIAGNOSIS — E785 Hyperlipidemia, unspecified: Secondary | ICD-10-CM | POA: Diagnosis not present

## 2020-02-16 DIAGNOSIS — I10 Essential (primary) hypertension: Secondary | ICD-10-CM | POA: Diagnosis not present

## 2020-02-16 DIAGNOSIS — I2510 Atherosclerotic heart disease of native coronary artery without angina pectoris: Secondary | ICD-10-CM | POA: Diagnosis not present

## 2020-02-16 DIAGNOSIS — K8020 Calculus of gallbladder without cholecystitis without obstruction: Secondary | ICD-10-CM | POA: Diagnosis not present

## 2020-03-01 ENCOUNTER — Other Ambulatory Visit: Payer: Self-pay

## 2020-03-01 ENCOUNTER — Inpatient Hospital Stay (HOSPITAL_COMMUNITY): Payer: Medicare Other | Attending: Hematology

## 2020-03-01 ENCOUNTER — Ambulatory Visit (HOSPITAL_COMMUNITY)
Admission: RE | Admit: 2020-03-01 | Discharge: 2020-03-01 | Disposition: A | Discharge status: Home / Self Care | Payer: Medicare Other | Source: Ambulatory Visit | Attending: Family Medicine | Admitting: Family Medicine

## 2020-03-01 DIAGNOSIS — R911 Solitary pulmonary nodule: Secondary | ICD-10-CM | POA: Diagnosis not present

## 2020-03-01 DIAGNOSIS — J9811 Atelectasis: Secondary | ICD-10-CM | POA: Diagnosis not present

## 2020-03-01 DIAGNOSIS — E559 Vitamin D deficiency, unspecified: Secondary | ICD-10-CM | POA: Diagnosis not present

## 2020-03-01 DIAGNOSIS — J984 Other disorders of lung: Secondary | ICD-10-CM | POA: Diagnosis not present

## 2020-03-01 DIAGNOSIS — C50912 Malignant neoplasm of unspecified site of left female breast: Secondary | ICD-10-CM | POA: Diagnosis not present

## 2020-03-01 DIAGNOSIS — J432 Centrilobular emphysema: Secondary | ICD-10-CM | POA: Diagnosis not present

## 2020-03-01 DIAGNOSIS — K449 Diaphragmatic hernia without obstruction or gangrene: Secondary | ICD-10-CM | POA: Diagnosis not present

## 2020-03-01 LAB — CBC WITH DIFFERENTIAL/PLATELET
Abs Immature Granulocytes: 0.02 10*3/uL (ref 0.00–0.07)
Basophils Absolute: 0 10*3/uL (ref 0.0–0.1)
Basophils Relative: 1 %
Eosinophils Absolute: 0.1 10*3/uL (ref 0.0–0.5)
Eosinophils Relative: 2 %
HCT: 40.5 % (ref 36.0–46.0)
Hemoglobin: 13 g/dL (ref 12.0–15.0)
Immature Granulocytes: 0 %
Lymphocytes Relative: 37 %
Lymphs Abs: 2.9 10*3/uL (ref 0.7–4.0)
MCH: 31.3 pg (ref 26.0–34.0)
MCHC: 32.1 g/dL (ref 30.0–36.0)
MCV: 97.6 fL (ref 80.0–100.0)
Monocytes Absolute: 0.4 10*3/uL (ref 0.1–1.0)
Monocytes Relative: 6 %
Neutro Abs: 4.3 10*3/uL (ref 1.7–7.7)
Neutrophils Relative %: 54 %
Platelets: 299 10*3/uL (ref 150–400)
RBC: 4.15 MIL/uL (ref 3.87–5.11)
RDW: 13.6 % (ref 11.5–15.5)
WBC: 7.9 10*3/uL (ref 4.0–10.5)
nRBC: 0 % (ref 0.0–0.2)

## 2020-03-01 LAB — COMPREHENSIVE METABOLIC PANEL
ALT: 13 U/L (ref 0–44)
AST: 17 U/L (ref 15–41)
Albumin: 3.7 g/dL (ref 3.5–5.0)
Alkaline Phosphatase: 123 U/L (ref 38–126)
Anion gap: 8 (ref 5–15)
BUN: 10 mg/dL (ref 8–23)
CO2: 25 mmol/L (ref 22–32)
Calcium: 9.1 mg/dL (ref 8.9–10.3)
Chloride: 100 mmol/L (ref 98–111)
Creatinine, Ser: 0.81 mg/dL (ref 0.44–1.00)
GFR, Estimated: >60 mL/min (ref >60)
Glucose, Bld: 94 mg/dL (ref 70–99)
Potassium: 3.6 mmol/L (ref 3.5–5.1)
Sodium: 133 mmol/L — ABNORMAL LOW (ref 135–145)
Total Bilirubin: 0.5 mg/dL (ref 0.3–1.2)
Total Protein: 7 g/dL (ref 6.5–8.1)

## 2020-03-01 LAB — VITAMIN B12: Vitamin B-12: 400 pg/mL (ref 180–914)

## 2020-03-01 LAB — LACTATE DEHYDROGENASE: LDH: 129 U/L (ref 98–192)

## 2020-03-01 LAB — VITAMIN D 25 HYDROXY (VIT D DEFICIENCY, FRACTURES): Vit D, 25-Hydroxy: 20 ng/mL — ABNORMAL LOW (ref 30–100)

## 2020-03-01 MED ORDER — IOHEXOL 300 MG/ML  SOLN
75.0000 mL | Freq: Once | INTRAMUSCULAR | Status: AC | PRN
  Administered 2020-03-01: 75 mL via INTRAVENOUS

## 2020-03-06 ENCOUNTER — Other Ambulatory Visit (HOSPITAL_COMMUNITY): Payer: Medicare Other

## 2020-03-08 DIAGNOSIS — R911 Solitary pulmonary nodule: Secondary | ICD-10-CM | POA: Diagnosis not present

## 2020-03-08 DIAGNOSIS — E538 Deficiency of other specified B group vitamins: Secondary | ICD-10-CM | POA: Diagnosis not present

## 2020-03-08 DIAGNOSIS — L853 Xerosis cutis: Secondary | ICD-10-CM | POA: Diagnosis not present

## 2020-03-08 DIAGNOSIS — Z681 Body mass index (BMI) 19 or less, adult: Secondary | ICD-10-CM | POA: Diagnosis not present

## 2020-03-08 DIAGNOSIS — K219 Gastro-esophageal reflux disease without esophagitis: Secondary | ICD-10-CM | POA: Diagnosis not present

## 2020-03-13 ENCOUNTER — Ambulatory Visit (HOSPITAL_COMMUNITY): Payer: Medicare Other | Admitting: Hematology

## 2020-03-13 ENCOUNTER — Ambulatory Visit (HOSPITAL_COMMUNITY): Payer: Medicare Other | Admitting: Nurse Practitioner

## 2020-03-14 DIAGNOSIS — S42202D Unspecified fracture of upper end of left humerus, subsequent encounter for fracture with routine healing: Secondary | ICD-10-CM | POA: Diagnosis not present

## 2020-03-18 NOTE — Progress Notes (Signed)
Referring Provider: Redmond School, MD Primary Care Physician:  Redmond School, MD Primary GI Physician: Dr. Abbey Chatters  Chief Complaint  Patient presents with   Follow-up    Vomiting after eating, pain under rib cage    HPI:   Margaret Thompson is a 87 y.o. female presenting today for follow-up.  She has history of constipation, GERD, nausea with vomiting, epigastric pain, elevated alk phos in June 2021 with evaluation including hepatitis A, B, and C all negative, RUQ ultrasound with fatty liver, cholelithiasis without cholecystitis, CBD normal.  LFTs back to normal in August 2021 and have remained within normal limits.  EGD from 2019 with moderate Schatzki ring status post dilation, esophagitis, medium hiatal hernia with gastritis (no H. Pylori), and duodenitis. Last colonoscopy in 2010 with multiple colon polyps ranging from 4-8 mm (tubular adenomas and hyperplastic polyps), 2 cecal AVMs, large internal hemorrhoids, pancolonic diverticulosis.  Patient declined further colonoscopies.  Last seen in our office 12/21/2019 with chief complaint of throat burning x2 weeks that was postprandial in nature and followed by vomiting.  She was on Protonix BID for GERD and did not feel this was helping.  Denies dysphagia or odynophagia.  She is found to have a mild amount of thrush in the posterior oropharynx.  Plan to try Dexilant for GERD, Magic mouthwash 3 times daily x14 days for thrush.  Today: Received office visit note from PCP dated 03/08/2020 she was being seen for dry, itchy skin that has started since she started wearing a sling for her left arm last month.  She was prescribed clobetasol. She also reported severe acid reflux stating she had not taken PPI in years.  Symptoms were worse after eating with vomiting and worse in the mornings.  She was prescribed pantoprazole 40 mg twice daily.  Additionally, she was found to have a left lung nodule on CT but preferred not to have referral to thoracic  surgeon.  She was agreeable to repeat CT in 3 months.  States she had been having postprandial vomiting up until about 1-2 weeks ago.  States she would do fine during the day, but after eating her evening meal, she would vomit most of her food that she ate during the day.  No associated nausea.  States the food will just come up.  After she vomited her food, she would then vomit a little liquid a few minutes later, and then would feel fine.  No hematemesis or coffee-ground emesis.  No associated abdominal pain with this.  Cannot member if she tried Dexilant that Dr. Abbey Chatters had provided.  Cannot remember if she was taking pantoprazole prior to PCPs prescribing this on 2/24. Denies any typical GERD or heartburn symptoms or dysphagia.  In addition to starting Protonix, she is also started limiting her portion sizes as she felt when she ate larger portions, symptoms of regurgitation or worse.  No specific food triggers.  In general, she does not eat spicy, fried, fatty, greasy foods.   Over the last 1-2 weeks, she has started to notice epigastric pain that is worsened by coughing, increasing intra-abdominal pressure, or pressing on the area.  No association with meals or bowel movements.  States she did fall and break her shoulder about 1 month ago and wonders if she may have pulled something.  She is not taking any NSAIDs.  Taking Tylenol for pain.  Bowels are moving well. Not taking Linzess.  No BRBPR or melena.  Burning in her throat resolved with  magic mouth wash.   Past Medical History:  Diagnosis Date   Acute MI ([place]) 2003   Adenocarcinoma of left breast ([place]) 11/23/2012   Stage II (T1 C. N1 M0) adenocarcinoma of the left breast with surgery on 03/08/2001. She had a 1.5 cm cancer with 1/6 positive sentinel nodes. Reexcision margins were clear. She did participate in NSABP B.-30 randomized to Adriamycin and Taxotere for 4 cycles followed by radiation therapy from 07/13/2001 to 08/27/2001. She then  started Arimidex on 07/07/2001 took that until the end of June 2008. T   Anemia    Bilateral cataracts    Breast cancer ([facility])    GERD (gastroesophageal reflux disease)    History of gout    Hypercholesteremia    Hypertension    Malignant neoplasm of lower-inner quadrant of left female breast [facility])    Osteoporosis     Past Surgical History:  Procedure Laterality Date   ABDOMINAL HYSTERECTOMY     BREAST SURGERY     CATARACT EXTRACTION W/PHACO Left 09/04/2014   Procedure: CATARACT EXTRACTION PHACO AND INTRAOCULAR LENS PLACEMENT ([place]);  Surgeon: Tonny Branch, MD;  Location: [facility];  Service: Ophthalmology;  Laterality: Left;  CDE:9.84   CATARACT EXTRACTION W/PHACO Right 11/09/2014   Procedure: CATARACT EXTRACTION PHACO AND INTRAOCULAR LENS PLACEMENT (IOC);  Surgeon: Tonny Branch, MD;  Location: [facility];  Service: Ophthalmology;  Laterality: Right;  CDE:8.40   CORONARY ANGIOPLASTY     RCA stent   ESOPHAGOGASTRODUODENOSCOPY (EGD) WITH PROPOFOL N/A 08/04/2017   Fields: esophagitis, gastritis, no h.pylori. medium sized hiatal hernia, moderate schatzki ring s/p dilation   LEFT HEART CATHETERIZATION WITH CORONARY ANGIOGRAM N/A 12/21/2012   Procedure: LEFT HEART CATHETERIZATION WITH CORONARY ANGIOGRAM;  Surgeon: Clent Demark, MD;  Location: [place] CATH LAB;  Service: Cardiovascular;  Laterality: N/A;   MASTECTOMY MODIFIED RADICAL Left 01/16/2017   Procedure: SIMPLE MASTECTOMY;  Surgeon: Aviva Signs, MD;  Location: [facility];  Service: General;  Laterality: Left;   MASTECTOMY, PARTIAL Left    PERCUTANEOUS STENT INTERVENTION     RCA   PORT-A-CATH REMOVAL     PORT-A-CATH REMOVAL Right 09/13/2018   Procedure: REMOVAL PORT-A-CATH;  Surgeon: Aviva Signs, MD;  Location: [facility];  Service: General;  Laterality: Right;   PORTACATH PLACEMENT     PORTACATH PLACEMENT Right 03/11/2017   Procedure: INSERTION PORT-A-CATH;  Surgeon: Aviva Signs, MD;  Location: [facility];  Service: General;   Laterality: Right;   SAVORY DILATION N/A 08/04/2017   Procedure: SAVORY DILATION;  Surgeon: Danie Binder, MD;  [facility];  Service: Endoscopy;  Laterality: N/A;    Current Outpatient Medications  Medication Sig Dispense Refill   Artificial Tear Solution (SOOTHE XP OP) Place 1-2 drops into both eyes daily as needed (dry eye).      aspirin EC 81 MG tablet Take 81 mg by mouth daily.     Cyanocobalamin 1000 MCG/ML KIT Inject 1 Dose as directed every 30 (thirty) days.      diltiazem (TIAZAC) 180 MG 24 hr capsule Take 180 mg by mouth daily.     pantoprazole (PROTONIX) 40 MG tablet Take 1 tablet (40 mg total) by mouth 2 (two) times daily before a meal. 60 tablet 5   alclomethasone (ACLOVATE) 0.05 % cream Apply to right ear sore twice daily for two weeks. (Patient not taking: No sig reported)     atorvastatin (LIPITOR) 40 MG tablet Take 40 mg by mouth daily.  (Patient not taking: Reported on 03/19/2020)  linaclotide (LINZESS) 72 MCG capsule Take 1 capsule (72 mcg total) by mouth daily before breakfast. (Patient not taking: Reported on 03/19/2020) 12 capsule 0   No current facility-administered medications for this visit.    Allergies as of 03/19/2020 - Review Complete 03/19/2020  Allergen Reaction Noted   Azithromycin Anaphylaxis and Hives 09/15/2010    Family History  Problem Relation Age of Onset   Stroke Mother    Colon cancer Neg Hx    Gastric cancer Neg Hx    Esophageal cancer Neg Hx     Social History   Socioeconomic History   Marital status: Widowed    Spouse name: Not on file   Number of children: Not on file   Years of education: Not on file   Highest education level: Not on file  Occupational History   Not on file  Tobacco Use   Smoking status: Current Every Day Smoker    Packs/day: 0.50    Years: 50.00    Pack years: 25.00    Types: Cigarettes   Smokeless tobacco: Never Used   Tobacco comment: 10 cigarettes daily  Vaping Use    Vaping Use: Never used  Substance and Sexual Activity   Alcohol use: No   Drug use: No   Sexual activity: Not Currently    Birth control/protection: Surgical  Other Topics Concern   Not on file  Social History Narrative   Not on file   Social Determinants of Health   Financial Resource Strain: Not on file  Food Insecurity: Not on file  Transportation Needs: Not on file  Physical Activity: Not on file  Stress: Not on file  Social Connections: Not on file    Review of Systems: Gen: Denies fever, chills, cold or flulike symptoms, lightheadedness, dizziness, presyncope, syncope. CV: Denies chest pain or palpitations. Resp: Denies dyspnea.  Admits to occasional cough. GI: See HPI Heme: See HPI  Physical Exam: BP 130/72    Pulse 99    Temp (!) 97.3 F (36.3 C) (Temporal)    Ht _0  (1.575 m)    Wt 118 lb 3.2 oz (53.6 kg)    BMI 21.62 kg/m  General:   Alert and oriented. No distress noted. Pleasant and cooperative.  Head:  Normocephalic and atraumatic. Eyes:  Conjuctiva clear without scleral icterus. Mouth:  Oral mucosa pink and moist.  No oral thrush. Heart:  S1, S2 present without murmurs appreciated. Lungs:  Clear to auscultation bilaterally. No wheezes, rales, or rhonchi. No distress.  Abdomen:  +BS, soft, and non-distended.  Mild TTP in the epigastric area.  No rebound or guarding. No HSM or masses noted. Msk:  Symmetrical without gross deformities. Normal posture. Extremities:  Without edema. Neurologic:  Alert and  oriented x4 Psych: Normal mood and affect.  Assessment: 87 year old female with history of constipation, GERD, nausea with vomiting, epigastric pain previously improved with PPI, oral thrush noted in December 2021 presenting today for follow-up of postprandial vomiting, GERD, thrush, and reporting new onset epigastric pain.  Postprandial vomiting: Several month history of postprandial vomiting without associated nausea that has now resolved over the  last 1-2 weeks since limiting portion sizes and resuming PPI BID. GERD well controlled. Difficult to get a clear understating of what medication patient had previously been taking, but she doesn't remember trying Dexilant (previously given samples in December 2021). Had been prescribed Protonix 40 mg BID, but per visit with PCP, patient hadn't been taking PPI in quite some time (see HPI). Query  whether symptoms may have been related to uncontrolled GERD; however, additional consideration include gastroparesis, gastric output obstruction, pyloric stenosis as patient reports vomiting foods in the evening she had eaten earlier in the day without associated nausea.    Epigastric pain: New onset epigastric pain x2 weeks worsened by increasing intraabdominal pressure, coughing, or palpation. No association with meals or BMs. Notably, patient did fall about 1 month or so ago and fractured her left shoulder. Additionally, she had been experiencing post-prandial vomiting as per above that has now resolved with resuming PPI BID though she denies having any associated abdominal pain with postprandial vomiting. Denies melena or NSAID use aside from 81 mg aspirin. Query whether patient may have strained or pulled something when she fell. Can't rule out gastritis as she hasn't been on PPI, less likely PUD. Not consistent with pancreatitis or biliary etiology.   Offered EGD for further evaluation, but patient preferred to monitor for now. Will go ahead and update labs.   Oral thrush: Resolved with Magic mouthwash.  Constipation: No longer an issue.  Bowels are moving well without Linzess.   Plan: 1.  CBC, CMP, lipase 2.  Continue Protonix 40 mg twice daily 30 minutes before breakfast and dinner. 3.  Continue to avoid all NSAID products aside from daily 81 mg aspirin. 4.  Monitor for worsening abdominal pain and let us know if this occurs.  Requested progress report in 1-2 weeks regardless. 5.  Follow-up in 3  months.   Aliene Altes, PA-C [facility] 03/19/2020

## 2020-03-19 ENCOUNTER — Ambulatory Visit: Payer: Medicare Other | Admitting: Gastroenterology

## 2020-03-19 ENCOUNTER — Other Ambulatory Visit: Payer: Self-pay

## 2020-03-19 ENCOUNTER — Encounter: Payer: Self-pay | Admitting: Gastroenterology

## 2020-03-19 VITALS — BP 130/72 | HR 99 | Temp 97.3°F | Ht 62.0 in | Wt 118.2 lb

## 2020-03-19 DIAGNOSIS — K2100 Gastro-esophageal reflux disease with esophagitis, without bleeding: Secondary | ICD-10-CM | POA: Diagnosis not present

## 2020-03-19 DIAGNOSIS — B370 Candidal stomatitis: Secondary | ICD-10-CM | POA: Diagnosis not present

## 2020-03-19 DIAGNOSIS — R1110 Vomiting, unspecified: Secondary | ICD-10-CM | POA: Diagnosis not present

## 2020-03-19 DIAGNOSIS — R1013 Epigastric pain: Secondary | ICD-10-CM

## 2020-03-19 NOTE — Patient Instructions (Addendum)
Please have labs completed at [facility].  Continue Protonix 40 mg twice daily 30 minutes before breakfast and dinner.  Aside from aspirin, avoid NSAID products including ibuprofen, Aleve, Advil, Goody powders, naproxen, and anything that says "NSAID" on the package.  For upper abdominal pain:  Monitor your upper abdominal discomfort and let us know if your symptoms persist or if you have any worsening symptoms.  Please call with a progress report in 1-2 weeks.  We will arrange for you to have a follow-up in the office in 3 months, but we can see you sooner if needed.  It was good to meet you today!  Aliene Altes, PA-C [facility]

## 2020-03-26 DIAGNOSIS — M25561 Pain in right knee: Secondary | ICD-10-CM | POA: Diagnosis not present

## 2020-03-26 DIAGNOSIS — Z681 Body mass index (BMI) 19 or less, adult: Secondary | ICD-10-CM | POA: Diagnosis not present

## 2020-03-26 DIAGNOSIS — R519 Headache, unspecified: Secondary | ICD-10-CM | POA: Diagnosis not present

## 2020-04-05 ENCOUNTER — Inpatient Hospital Stay (HOSPITAL_COMMUNITY): Payer: Medicare Other | Attending: Hematology | Admitting: Hematology

## 2020-04-05 ENCOUNTER — Other Ambulatory Visit: Payer: Self-pay

## 2020-04-05 VITALS — BP 134/77 | HR 97 | Temp 98.5°F | Resp 17 | Wt 119.2 lb

## 2020-04-05 DIAGNOSIS — Z9012 Acquired absence of left breast and nipple: Secondary | ICD-10-CM | POA: Insufficient documentation

## 2020-04-05 DIAGNOSIS — C50912 Malignant neoplasm of unspecified site of left female breast: Secondary | ICD-10-CM | POA: Insufficient documentation

## 2020-04-05 DIAGNOSIS — Z79899 Other long term (current) drug therapy: Secondary | ICD-10-CM | POA: Diagnosis not present

## 2020-04-05 DIAGNOSIS — C50312 Malignant neoplasm of lower-inner quadrant of left female breast: Secondary | ICD-10-CM

## 2020-04-05 DIAGNOSIS — Z7982 Long term (current) use of aspirin: Secondary | ICD-10-CM | POA: Insufficient documentation

## 2020-04-05 DIAGNOSIS — Z171 Estrogen receptor negative status [ER-]: Secondary | ICD-10-CM | POA: Diagnosis not present

## 2020-04-05 DIAGNOSIS — M810 Age-related osteoporosis without current pathological fracture: Secondary | ICD-10-CM | POA: Diagnosis not present

## 2020-04-05 DIAGNOSIS — E559 Vitamin D deficiency, unspecified: Secondary | ICD-10-CM | POA: Insufficient documentation

## 2020-04-05 NOTE — Patient Instructions (Addendum)
[place] at [facility] Discharge Instructions  You were seen today by Dr. Delton Coombes. He went over your recent results and scans. You will be prescribed Levaquin to take daily for 7 days. Purchase vitamin D 2,000 units over the counter and take once daily. You will be scheduled to have a mammogram after September 04, 2020 and a PET scan before your next visit. Dr. Delton Coombes will see you back in 6 weeks for follow up.   Thank you for choosing [place] at [facility] to provide your oncology and hematology care.  To afford each patient quality time with our provider, please arrive at least 15 minutes before your scheduled appointment time.   If you have a lab appointment with the [place] please come in thru the Main Entrance and check in at the main information desk  You need to re-schedule your appointment should you arrive 10 or more minutes late.  We strive to give you quality time with our providers, and arriving late affects you and other patients whose appointments are after yours.  Also, if you no show three or more times for appointments you may be dismissed from the clinic at the providers discretion.     Again, thank you for choosing [facility].  Our hope is that these requests will decrease the amount of time that you wait before being seen by our physicians.       _____________________________________________________________  Should you have questions after your visit to [facility], please contact our office at [phone] between the hours of 8:00 a.m. and 4:30 p.m.  Voicemails left after 4:00 p.m. will not be returned until the following business day.  For prescription refill requests, have your pharmacy contact our office and allow 72 hours.    Cancer Center Support Programs:   > Cancer Support Group  2nd Tuesday of the month 1pm-2pm, Journey Room

## 2020-04-05 NOTE — Progress Notes (Signed)
[place] [address] [place] [address]   Patient Care Team: Redmond School, MD as PCP - General (Internal Medicine) Danie Binder, MD (Inactive) as Consulting Physician (Gastroenterology) Eloise Harman, DO as Consulting Physician (Internal Medicine)  SUMMARY OF ONCOLOGIC HISTORY: Oncology History  Malignant neoplasm of left female breast [facility])  08/21/2016 Pathology Results   Left needle core biopsy, lower-inner quadrant: invasive ductal carcinoma and DCIS.  ER/PR-, HER2-.    01/16/2017 Surgery   Left simple mastectomy by Dr. Alean Rinne ductal carcinoma, grade 3, measuring 1.8 cm with DCIS (high-grade).  Lymphovascular invasion is identified.  Clear surgical margins.     CHIEF COMPLIANT: Follow-up for left breast cancer   INTERVAL HISTORY: [name] is a 87 y.o. female here today for follow up of her left breast cancer. Her last visit was with Francene Finders, NP, on 09/09/2019.   Today she reports feeling okay. She fell and broke her left shoulder during the last week of January 2022 after walking out on ice; she gets occasional left chest tightness, but denies CP. She is not taking vitamin D currently. She denies having any recent infections.  She continues smoking 1/2 PPD for 65 years; she quit for 5 years. She lives alone but has family nearby.   REVIEW OF SYSTEMS:   Review of Systems  Constitutional: Positive for fatigue (depleted). Negative for appetite change.  Respiratory: Positive for chest tightness (L chest tightness occassional).   Cardiovascular: Negative for chest pain.  Musculoskeletal: Positive for arthralgias (3/10 L shoulder pain).  All other systems reviewed and are negative.   I have reviewed the past medical history, past surgical history, social history and family history with the patient and they are unchanged from previous note.   ALLERGIES:   is allergic to azithromycin.   MEDICATIONS:  Current Outpatient  Medications  Medication Sig Dispense Refill   alclomethasone (ACLOVATE) 0.05 % cream Apply to right ear sore twice daily for two weeks.     Artificial Tear Solution (SOOTHE XP OP) Place 1-2 drops into both eyes daily as needed (dry eye).      ASPIRIN 81 PO aspirin 81 mg capsule  Take by oral route.     aspirin EC 81 MG tablet Take 81 mg by mouth daily.     atorvastatin (LIPITOR) 40 MG tablet Take 40 mg by mouth daily.     Cyanocobalamin 1000 MCG/ML KIT Inject 1 Dose as directed every 30 (thirty) days.      diltiazem (CARDIZEM CD) 180 MG 24 hr capsule Take 180 mg by mouth daily.     linaclotide (LINZESS) 72 MCG capsule Take 1 capsule (72 mcg total) by mouth daily before breakfast. 12 capsule 0   pantoprazole (PROTONIX) 40 MG tablet Take 1 tablet (40 mg total) by mouth 2 (two) times daily before a meal. 60 tablet 5   No current facility-administered medications for this visit.     PHYSICAL EXAMINATION: Performance status (ECOG): 1 - Symptomatic but completely ambulatory  Vitals:   04/05/20 1637  BP: 134/77  Pulse: 97  Resp: 17  Temp: 98.5 F (36.9 C)  SpO2: 94%   Wt Readings from Last 3 Encounters:  04/05/20 119 lb 4 oz (54.1 kg)  03/19/20 118 lb 3.2 oz (53.6 kg)  12/21/19 121 lb (54.9 kg)   Physical Exam Vitals reviewed.  Constitutional:      Appearance: Normal appearance.  Cardiovascular:     Rate and Rhythm: Normal  rate and regular rhythm.     Pulses: Normal pulses.     Heart sounds: Normal heart sounds.  Pulmonary:     Effort: Pulmonary effort is normal.     Breath sounds: Normal breath sounds.  Chest:  Breasts:     Right: Normal. No swelling, bleeding, inverted nipple, mass, nipple discharge, skin change, tenderness, axillary adenopathy or supraclavicular adenopathy.     Left: Absent. No mass, skin change, tenderness, axillary adenopathy or supraclavicular adenopathy.    Abdominal:     Palpations: Abdomen is soft. There is no hepatomegaly or mass.      Tenderness: There is no abdominal tenderness.     Hernia: No hernia is present.  Musculoskeletal:     Right lower leg: No edema.     Left lower leg: No edema.  Lymphadenopathy:     Upper Body:     Right upper body: No supraclavicular, axillary or pectoral adenopathy.     Left upper body: No supraclavicular, axillary or pectoral adenopathy.  Neurological:     General: No focal deficit present.     Mental Status: She is alert and oriented to person, place, and time.  Psychiatric:        Mood and Affect: Mood normal.        Behavior: Behavior normal.     Breast Exam Chaperone: Milinda Antis, MD     LABORATORY DATA:  I have reviewed the data as listed CMP Latest Ref Rng & Units 03/01/2020 09/05/2019 01/26/2019  Glucose 70 - 99 mg/dL 94 110(H) 98  BUN 8 - 23 mg/dL _0 Creatinine 0.44 - 1.00 mg/dL 0.81 0.91 0.79  Sodium 135 - 145 mmol/L 133(L) 135 139  Potassium 3.5 - 5.1 mmol/L 3.6 3.6 3.7  Chloride 98 - 111 mmol/L 100 104 104  CO2 22 - 32 mmol/L _1 Calcium 8.9 - 10.3 mg/dL 9.1 9.0 9.3  Total Protein 6.5 - 8.1 g/dL 7.0 7.3 7.6  Total Bilirubin 0.3 - 1.2 mg/dL 0.5 0.6 0.4  Alkaline Phos 38 - 126 U/L 123 99 78  AST 15 - 41 U/L 17 16 14(L)  ALT 0 - 44 U/L _2 No results found for: [id number] Lab Results  Component Value Date   WBC 7.9 03/01/2020   HGB 13.0 03/01/2020   HCT 40.5 03/01/2020   MCV 97.6 03/01/2020   PLT 299 03/01/2020   NEUTROABS 4.3 03/01/2020   Lab Results  Component Value Date   LDH 129 03/01/2020   LDH 131 09/05/2019   Lab Results  Component Value Date   VD25OH 20.00 (L) 03/01/2020   VD25OH 24.64 (L) 09/05/2019    ASSESSMENT:  1.  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 Arnoldo Morale on 01/16/2017 showing a 1.8 cm primary tumor, grade 3, LVI positive, clear surgical margins, with high-grade DCIS.  Lymph nodes cannot be removed due to prior history of left breast cancer treatment. -PET  scan on 03/27/2017 showed mildly hypermetabolic small prevascular lymph node and right lower peritracheal lymph node indeterminate.  Moderate metabolic activity associated with ill defined left upper lobe nodule. -PET scan on 08/15/2017 showed mild reduction in size and activity of the left and left upper lobe pulmonary nodules, favoring benign etiology.  Stable small prevascular lymph nodes and reduced size and activity of the lower pretracheal lymph node.  Overall suggest reactive/inflammatory etiology. -Adjuvant chemotherapy was not recommended as more than 7 months  have elapsed since her surgery. -She reports mild tenderness at the mastectomy site which is stable.  She also reports tightness at times. -She was given a referral to remove her Port-A-Cath at her last visit. -Last mammogram done on 09/02/2019 was BI-RADS Category 1 negative.   2.  History of node positive left breast cancer: -Diagnosed on 02/16/2001, T1 CN1M0, stage IIa, ER/PR positive, HER-2 negative, 1.5 cm, 1 out of 6 lymph nodes positive.  3.  Osteoporosis: -Last DEXA scan done on 09/02/2019 showed a T score of -3.4  4.  Vitamin D deficiency: -Labs done on 09/05/2019 showed vitamin D level 24.64    PLAN:  1.  Stage I (PT1CNX) left breast IDC, triple receptor negative: -She apparently fell and broke her left shoulder. -Left chest wall has no palpable masses.  Right breast has no palpable masses.  No palpable adenopathy in supraclavicular axillary region. -Reviewed labs from 03/01/2020 which showed normal LFTs.  CBC was also normal. -We will schedule her for mammogram in August.  2.  Left lung nodule: -Reviewed CT images of the chest from 03/01/2020.  2.3 x 1.7 cm left lung nodule at the junction of the lingula and left lower lobe with spiculated margins, suspicious for malignancy.  No adenopathy was noted. -She is a current active smoker.  I will prescribe her Levaquin about 7 days.  I plan to arrange for PET scan in 3 to 4  weeks. -I will see her back after the PET scan.  3.  Osteoporosis: -Continue calcium and vitamin D supplements.  4.  Vitamin D deficiency: -Vitamin D level is 20.  She was told to take vitamin D 2000 units daily.    Breast Cancer therapy associated bone loss: I have recommended calcium, Vitamin D and weight bearing exercises.   Orders Placed This Encounter  Procedures   NM PET Image Initial (PI) Skull Base To Thigh    Standing Status:   Future    Standing Expiration Date:   04/05/2021    Order Specific Question:   If indicated for the ordered procedure, I authorize the administration of a radiopharmaceutical per Radiology protocol    Answer:   Yes    Order Specific Question:   Preferred imaging location?    Answer:   [facility]    Order Specific Question:   Release to patient    Answer:   Immediate   MM 3D SCREEN BREAST UNI RIGHT    Standing Status:   Future    Standing Expiration Date:   04/05/2021    Order Specific Question:   Reason for Exam (SYMPTOM  OR DIAGNOSIS REQUIRED)    Answer:   screening for breast cancer, hx of left breast cancer    Order Specific Question:   Preferred imaging location?    Answer:   [facility]    Order Specific Question:   Release to patient    Answer:   Immediate   The patient has a good understanding of the overall plan. she agrees with it. she will call with any problems that may develop before the next visit here.    Derek Jack, MD [place] [phone]   I, Milinda Antis, am acting as a scribe for Dr. Sanda Linger.  I, Derek Jack MD, have reviewed the above documentation for accuracy and completeness, and I agree with the above.

## 2020-04-06 MED ORDER — LEVOFLOXACIN 500 MG PO TABS: 500.0000 mg | ORAL_TABLET | Freq: Every day | ORAL | 0 refills | Status: DC

## 2020-04-09 ENCOUNTER — Other Ambulatory Visit (HOSPITAL_COMMUNITY): Payer: Self-pay | Admitting: Surgery

## 2020-04-09 ENCOUNTER — Telehealth (HOSPITAL_COMMUNITY): Payer: Self-pay | Admitting: Surgery

## 2020-04-09 ENCOUNTER — Encounter (HOSPITAL_COMMUNITY): Payer: Self-pay | Admitting: Surgery

## 2020-04-09 DIAGNOSIS — M810 Age-related osteoporosis without current pathological fracture: Secondary | ICD-10-CM

## 2020-04-09 DIAGNOSIS — C50312 Malignant neoplasm of lower-inner quadrant of left female breast: Secondary | ICD-10-CM

## 2020-04-09 DIAGNOSIS — Z171 Estrogen receptor negative status [ER-]: Secondary | ICD-10-CM

## 2020-04-09 MED ORDER — AMOXICILLIN-POT CLAVULANATE 875-125 MG PO TABS: 1.0000 | ORAL_TABLET | Freq: Two times a day (BID) | ORAL | 0 refills | Status: AC

## 2020-04-09 NOTE — Telephone Encounter (Signed)
Margaret Thompson had called to let our office know that the pt was allergic to the antibiotic that Dr. Delton Coombes had ordered, and that she was allergic to some other medications that we did not have on file.  According to the pharmacist, the pt is allergic to Levaquin, Cipro, Azithromycin, and Gatifloxacin.    I updated the pt's allergy list and notified Dr. Delton Coombes of the allergy.  Dr. Delton Coombes changed the medication to Augmentin 875 mg twice a day for 7 days.  I called the pt to let her know of the change in antibiotic, and she asked why her discharge summary from her last visit stated to stop taking her diltiazem.  I notified Dr. Delton Coombes and he stated that he did not want her to stop taking the diltiazem.  The pt came to our office and I explained that the antibiotic was due to the pt's productive cough and to see if the pt's condition improved before her PET scan.  I also told the pt that she is to continue taking her diltiazem.  The pt verbalized understanding.

## 2020-04-11 DIAGNOSIS — I2510 Atherosclerotic heart disease of native coronary artery without angina pectoris: Secondary | ICD-10-CM | POA: Diagnosis not present

## 2020-04-11 DIAGNOSIS — I129 Hypertensive chronic kidney disease with stage 1 through stage 4 chronic kidney disease, or unspecified chronic kidney disease: Secondary | ICD-10-CM | POA: Diagnosis not present

## 2020-04-11 DIAGNOSIS — N1831 Chronic kidney disease, stage 3a: Secondary | ICD-10-CM | POA: Diagnosis not present

## 2020-04-11 DIAGNOSIS — E7849 Other hyperlipidemia: Secondary | ICD-10-CM | POA: Diagnosis not present

## 2020-04-20 DIAGNOSIS — D519 Vitamin B12 deficiency anemia, unspecified: Secondary | ICD-10-CM | POA: Diagnosis not present

## 2020-04-23 DIAGNOSIS — S42202D Unspecified fracture of upper end of left humerus, subsequent encounter for fracture with routine healing: Secondary | ICD-10-CM | POA: Diagnosis not present

## 2020-05-07 ENCOUNTER — Other Ambulatory Visit: Payer: Self-pay

## 2020-05-07 ENCOUNTER — Ambulatory Visit (HOSPITAL_COMMUNITY)
Admission: RE | Admit: 2020-05-07 | Discharge: 2020-05-07 | Disposition: A | Discharge status: Home / Self Care | Payer: Medicare Other | Source: Ambulatory Visit | Attending: Hematology | Admitting: Hematology

## 2020-05-07 DIAGNOSIS — C50312 Malignant neoplasm of lower-inner quadrant of left female breast: Secondary | ICD-10-CM | POA: Diagnosis not present

## 2020-05-07 DIAGNOSIS — Z171 Estrogen receptor negative status [ER-]: Secondary | ICD-10-CM | POA: Insufficient documentation

## 2020-05-07 DIAGNOSIS — R911 Solitary pulmonary nodule: Secondary | ICD-10-CM | POA: Diagnosis not present

## 2020-05-07 MED ORDER — FLUDEOXYGLUCOSE F - 18 (FDG) INJECTION
7.7700 | Freq: Once | INTRAVENOUS | Status: AC | PRN
  Administered 2020-05-07: 7.77 via INTRAVENOUS

## 2020-05-14 ENCOUNTER — Encounter (HOSPITAL_COMMUNITY): Payer: Self-pay | Admitting: *Deleted

## 2020-05-14 NOTE — Progress Notes (Signed)
Patient called clinic asking about her PET scan results.  I attempted to return her call and had to leave a VM.  Detailed message left about her appointment with [place] on 5/5 and that we would give her results at that appointment.  I asked that she return our call should she have any additional questions or concerns.

## 2020-05-17 ENCOUNTER — Ambulatory Visit (HOSPITAL_COMMUNITY): Payer: Medicare Other | Admitting: Hematology

## 2020-05-17 DIAGNOSIS — E785 Hyperlipidemia, unspecified: Secondary | ICD-10-CM | POA: Diagnosis not present

## 2020-05-17 DIAGNOSIS — I2510 Atherosclerotic heart disease of native coronary artery without angina pectoris: Secondary | ICD-10-CM | POA: Diagnosis not present

## 2020-05-17 DIAGNOSIS — F17290 Nicotine dependence, other tobacco product, uncomplicated: Secondary | ICD-10-CM | POA: Diagnosis not present

## 2020-05-17 DIAGNOSIS — I10 Essential (primary) hypertension: Secondary | ICD-10-CM | POA: Diagnosis not present

## 2020-05-21 ENCOUNTER — Other Ambulatory Visit: Payer: Self-pay

## 2020-05-21 ENCOUNTER — Inpatient Hospital Stay (HOSPITAL_COMMUNITY): Payer: Medicare Other | Attending: Hematology | Admitting: Hematology

## 2020-05-21 VITALS — BP 119/64 | HR 79 | Temp 97.8°F | Resp 18 | Wt 118.4 lb

## 2020-05-21 DIAGNOSIS — M810 Age-related osteoporosis without current pathological fracture: Secondary | ICD-10-CM | POA: Insufficient documentation

## 2020-05-21 DIAGNOSIS — Z7982 Long term (current) use of aspirin: Secondary | ICD-10-CM | POA: Diagnosis not present

## 2020-05-21 DIAGNOSIS — C50312 Malignant neoplasm of lower-inner quadrant of left female breast: Secondary | ICD-10-CM | POA: Diagnosis not present

## 2020-05-21 DIAGNOSIS — Z170 Estrogen receptor positive status [ER+]: Secondary | ICD-10-CM | POA: Diagnosis not present

## 2020-05-21 DIAGNOSIS — Z9012 Acquired absence of left breast and nipple: Secondary | ICD-10-CM | POA: Insufficient documentation

## 2020-05-21 DIAGNOSIS — F17210 Nicotine dependence, cigarettes, uncomplicated: Secondary | ICD-10-CM | POA: Insufficient documentation

## 2020-05-21 DIAGNOSIS — Z79899 Other long term (current) drug therapy: Secondary | ICD-10-CM | POA: Diagnosis not present

## 2020-05-21 DIAGNOSIS — E559 Vitamin D deficiency, unspecified: Secondary | ICD-10-CM | POA: Insufficient documentation

## 2020-05-21 DIAGNOSIS — Z171 Estrogen receptor negative status [ER-]: Secondary | ICD-10-CM | POA: Diagnosis not present

## 2020-05-21 NOTE — Progress Notes (Signed)
[place] [address] [place] [address]   Patient Care Team: Redmond School, MD as PCP - General (Internal Medicine) Danie Binder, MD (Inactive) as Consulting Physician (Gastroenterology) Eloise Harman, DO as Consulting Physician (Internal Medicine)  SUMMARY OF ONCOLOGIC HISTORY: Oncology History  Malignant neoplasm of left female breast [facility])  08/21/2016 Pathology Results   Left needle core biopsy, lower-inner quadrant: invasive ductal carcinoma and DCIS.  ER/PR-, HER2-.    01/16/2017 Surgery   Left simple mastectomy by Dr. Alean Rinne ductal carcinoma, grade 3, measuring 1.8 cm with DCIS (high-grade).  Lymphovascular invasion is identified.  Clear surgical margins.     CHIEF COMPLIANT: Follow-up for left breast cancer   INTERVAL HISTORY: Margaret Thompson is a 87 y.o. female here today for follow up of her left breast cancer. Her last visit was on 04/05/2020.   Today she reports feeling fair. She is currently living in [place]. She denies having any issues since last visit. She reports smoking 1/2 ppd.    REVIEW OF SYSTEMS:   Review of Systems  Constitutional: Positive for appetite change (50%) and fatigue (25%).  All other systems reviewed and are negative.   I have reviewed the past medical history, past surgical history, social history and family history with the patient and they are unchanged from previous note.    ALLERGIES:   is allergic to azithromycin, cipro [ciprofloxacin hcl], gatifloxacin, and levaquin [levofloxacin].   MEDICATIONS:  Current Outpatient Medications  Medication Sig Dispense Refill  . alclomethasone (ACLOVATE) 0.05 % cream Apply to right ear sore twice daily for two weeks.    . Artificial Tear Solution (SOOTHE XP OP) Place 1-2 drops into both eyes daily as needed (dry eye).     . ASPIRIN 81 PO aspirin 81 mg capsule  Take by oral route.    Marland Kitchen aspirin EC 81 MG tablet Take 81 mg by mouth daily.    Marland Kitchen  atorvastatin (LIPITOR) 40 MG tablet Take 40 mg by mouth daily.    . Cyanocobalamin 1000 MCG/ML KIT Inject 1 Dose as directed every 30 (thirty) days.     Marland Kitchen diltiazem (CARDIZEM CD) 180 MG 24 hr capsule Take 180 mg by mouth daily.    Marland Kitchen levofloxacin (LEVAQUIN) 500 MG tablet Take 1 tablet (500 mg total) by mouth daily. 7 tablet 0  . linaclotide (LINZESS) 72 MCG capsule Take 1 capsule (72 mcg total) by mouth daily before breakfast. 12 capsule 0  . pantoprazole (PROTONIX) 40 MG tablet Take 1 tablet (40 mg total) by mouth 2 (two) times daily before a meal. 60 tablet 5   No current facility-administered medications for this visit.     PHYSICAL EXAMINATION: Performance status (ECOG): 1 - Symptomatic but completely ambulatory  Vitals:   05/21/20 1542  BP: 119/64  Pulse: 79  Resp: 18  Temp: 97.8 F (36.6 C)  SpO2: 94%   Wt Readings from Last 3 Encounters:  05/21/20 118 lb 6.2 oz (53.7 kg)  04/05/20 119 lb 4 oz (54.1 kg)  03/19/20 118 lb 3.2 oz (53.6 kg)   Physical Exam Vitals reviewed.  Constitutional:      Appearance: Normal appearance.  Cardiovascular:     Rate and Rhythm: Normal rate and regular rhythm.     Pulses: Normal pulses.     Heart sounds: Normal heart sounds.  Pulmonary:     Effort: Pulmonary effort is normal.     Breath sounds: Normal breath sounds.  Neurological:  General: No focal deficit present.     Mental Status: She is alert and oriented to person, place, and time.  Psychiatric:        Mood and Affect: Mood normal.        Behavior: Behavior normal.     Breast Exam Chaperone: Margaret Antis, MD     LABORATORY DATA:  I have reviewed the data as listed CMP Latest Ref Rng & Units 03/01/2020 09/05/2019 01/26/2019  Glucose 70 - 99 mg/dL 94 110(H) 98  BUN 8 - 23 mg/dL 10 10 10   Creatinine 0.44 - 1.00 mg/dL 0.81 0.91 0.79  Sodium 135 - 145 mmol/L 133(L) 135 139  Potassium 3.5 - 5.1 mmol/L 3.6 3.6 3.7  Chloride 98 - 111 mmol/L 100 104 104  CO2 22 - 32  mmol/L 25 24 27   Calcium 8.9 - 10.3 mg/dL 9.1 9.0 9.3  Total Protein 6.5 - 8.1 g/dL 7.0 7.3 7.6  Total Bilirubin 0.3 - 1.2 mg/dL 0.5 0.6 0.4  Alkaline Phos 38 - 126 U/L 123 99 78  AST 15 - 41 U/L 17 16 14(L)  ALT 0 - 44 U/L 13 10 11    No results found for: KVQ259 Lab Results  Component Value Date   WBC 7.9 03/01/2020   HGB 13.0 03/01/2020   HCT 40.5 03/01/2020   MCV 97.6 03/01/2020   PLT 299 03/01/2020   NEUTROABS 4.3 03/01/2020    ASSESSMENT:  1. 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 Arnoldo Morale on 01/16/2017 showing a 1.8 cm primary tumor, grade 3, LVI positive, clear surgical margins, with high-grade DCIS. Lymph nodes cannot be removed due to prior history of left breast cancer treatment. -PET scan on 03/27/2017 showed mildly hypermetabolic small prevascular lymph node and right lower peritracheal lymph node indeterminate. Moderate metabolic activity associated with ill defined left upper lobe nodule. -PET scan on 08/15/2017 showed mild reduction in size and activity of the left and left upper lobe pulmonary nodules, favoring benign etiology. Stable small prevascular lymph nodes and reduced size and activity of the lower pretracheal lymph node. Overall suggest reactive/inflammatory etiology. -Adjuvant chemotherapy was not recommended as more than 7 months have elapsed since her surgery. -She reports mild tenderness at the mastectomy site which is stable. She also reports tightness at times. -She was given a referral to remove her Port-A-Cath at her last visit. -Last mammogram done on 8/20/2021was BI-RADS Category 1 negative.   2. History of node positive left breast cancer: -Diagnosed on 02/16/2001, T1 CN1M0, stage IIa, ER/PR positive, HER-2 negative, 1.5 cm, 1 out of 6 lymph nodes positive.  3. Osteoporosis: -Last DEXA scan done on8/20/2021showed a T score of -3.4  4.Vitamin D deficiency: -Labs done on 09/05/2019  showed vitamin D level 24.64   PLAN:  1. Stage I (PT1CNX) left breast IDC, triple receptor negative: -Left chest wall has no palpable masses when checked at last visit. - She will have mammogram in August.  2.   PET positive left lung nodule: -CT scan from 03/01/2020 showed 2.3 x 1.7 cm left lung nodule at the junction of the lingula and left lower lobe with spiculated margins suspicious for malignancy.  No adenopathy noted. - She is current active smoker.  We have given her a trial of Levaquin. - We reviewed PET scan from 05/07/2020 which showed 1.9 x 1.3 cm lingular nodule with SUV 5.9.  Subcentimeter lymph nodes in the anterior mediastinum with activity only slightly beyond mediastinal blood  pool with SUV 2.8.  Size and appearance of these lymph node is unchanged from the previous PET scan. - I have recommended biopsy of this lesion.  We will make a referral to Dr. Roxan Hockey. - We will also obtain full pulmonary function tests. - Based on the biopsy, she might be a candidate for SBRT if resection is not an option. - She reports that she cannot see Dr. Roxan Hockey until after school is finished for this year as her daughters work at school and have her to take her to [place].  She will likely see him in mid June. - I will see her back in 3 months for follow-up.  3. Osteoporosis: -Continue calcium and vitamin D supplements.  4.Vitamin D deficiency: -Continue vitamin D 2000 units daily.  Breast Cancer therapy associated bone loss: I have recommended calcium, Vitamin D and weight bearing exercises.  Orders placed this encounter:  Orders Placed This Encounter  Procedures  . Pulmonary Function Test    The patient has a good understanding of the overall plan. She agrees with it. She will call with any problems that may develop before the next visit here.  Margaret Jack, MD [place] [phone]   I, Margaret Thompson, am acting as a scribe for Dr.  Sanda Thompson.  I, Margaret Jack MD, have reviewed the above documentation for accuracy and completeness, and I agree with the above.

## 2020-05-21 NOTE — Patient Instructions (Addendum)
[place] at [facility] Discharge Instructions  You were seen today by Dr. Delton Coombes. He went over your recent results and scans. You will be referred to have a bronchoscopy for your lingular nodule. Dr. Delton Coombes will see you back in 3 months after the biopsy results for follow up.   Thank you for choosing [place] at [facility] to provide your oncology and hematology care.  To afford each patient quality time with our provider, please arrive at least 15 minutes before your scheduled appointment time.   If you have a lab appointment with the [place] please come in thru the Main Entrance and check in at the main information desk  You need to re-schedule your appointment should you arrive 10 or more minutes late.  We strive to give you quality time with our providers, and arriving late affects you and other patients whose appointments are after yours.  Also, if you no show three or more times for appointments you may be dismissed from the clinic at the providers discretion.     Again, thank you for choosing [facility].  Our hope is that these requests will decrease the amount of time that you wait before being seen by our physicians.       _____________________________________________________________  Should you have questions after your visit to [facility], please contact our office at [phone] between the hours of 8:00 a.m. and 4:30 p.m.  Voicemails left after 4:00 p.m. will not be returned until the following business day.  For prescription refill requests, have your pharmacy contact our office and allow 72 hours.    Cancer Center Support Programs:   > Cancer Support Group  2nd Tuesday of the month 1pm-2pm, Journey Room

## 2020-05-22 MED ORDER — LANREOTIDE ACETATE 120 MG/0.5ML ~~LOC~~ SOLN
SUBCUTANEOUS | Status: AC
  Filled 2020-05-22: qty 120

## 2020-05-24 DIAGNOSIS — E538 Deficiency of other specified B group vitamins: Secondary | ICD-10-CM | POA: Diagnosis not present

## 2020-05-25 ENCOUNTER — Other Ambulatory Visit (HOSPITAL_COMMUNITY): Payer: Medicare Other

## 2020-05-25 DIAGNOSIS — H04123 Dry eye syndrome of bilateral lacrimal glands: Secondary | ICD-10-CM | POA: Diagnosis not present

## 2020-05-28 ENCOUNTER — Encounter (HOSPITAL_COMMUNITY): Payer: Medicare Other

## 2020-05-29 DIAGNOSIS — I2510 Atherosclerotic heart disease of native coronary artery without angina pectoris: Secondary | ICD-10-CM | POA: Diagnosis not present

## 2020-05-29 DIAGNOSIS — E559 Vitamin D deficiency, unspecified: Secondary | ICD-10-CM | POA: Diagnosis not present

## 2020-05-29 DIAGNOSIS — Z1389 Encounter for screening for other disorder: Secondary | ICD-10-CM | POA: Diagnosis not present

## 2020-05-29 DIAGNOSIS — J449 Chronic obstructive pulmonary disease, unspecified: Secondary | ICD-10-CM | POA: Diagnosis not present

## 2020-05-29 DIAGNOSIS — I700 Atherosclerosis of aorta: Secondary | ICD-10-CM | POA: Diagnosis not present

## 2020-05-29 DIAGNOSIS — Z0001 Encounter for general adult medical examination with abnormal findings: Secondary | ICD-10-CM | POA: Diagnosis not present

## 2020-05-29 DIAGNOSIS — E7849 Other hyperlipidemia: Secondary | ICD-10-CM | POA: Diagnosis not present

## 2020-05-29 DIAGNOSIS — E538 Deficiency of other specified B group vitamins: Secondary | ICD-10-CM | POA: Diagnosis not present

## 2020-05-29 DIAGNOSIS — Z681 Body mass index (BMI) 19 or less, adult: Secondary | ICD-10-CM | POA: Diagnosis not present

## 2020-05-30 ENCOUNTER — Encounter: Payer: Medicare Other | Admitting: Thoracic Surgery (Cardiothoracic Vascular Surgery)

## 2020-06-26 ENCOUNTER — Encounter: Payer: Self-pay | Admitting: Internal Medicine

## 2020-06-29 ENCOUNTER — Other Ambulatory Visit: Payer: Self-pay

## 2020-06-29 ENCOUNTER — Other Ambulatory Visit (HOSPITAL_COMMUNITY)
Admission: RE | Admit: 2020-06-29 | Discharge: 2020-06-29 | Disposition: A | Discharge status: Home / Self Care | Payer: Medicare Other | Source: Ambulatory Visit | Attending: Hematology | Admitting: Hematology

## 2020-06-29 DIAGNOSIS — Z01812 Encounter for preprocedural laboratory examination: Secondary | ICD-10-CM | POA: Insufficient documentation

## 2020-06-29 DIAGNOSIS — Z20822 Contact with and (suspected) exposure to covid-19: Secondary | ICD-10-CM | POA: Insufficient documentation

## 2020-06-30 LAB — SARS CORONAVIRUS 2 (TAT 6-24 HRS): SARS Coronavirus 2: NEGATIVE

## 2020-07-03 ENCOUNTER — Other Ambulatory Visit: Payer: Self-pay

## 2020-07-03 ENCOUNTER — Ambulatory Visit (HOSPITAL_COMMUNITY)
Admission: RE | Admit: 2020-07-03 | Discharge: 2020-07-03 | Disposition: A | Discharge status: Home / Self Care | Payer: Medicare Other | Source: Ambulatory Visit | Attending: Hematology | Admitting: Hematology

## 2020-07-03 ENCOUNTER — Encounter: Payer: Self-pay | Admitting: Thoracic Surgery (Cardiothoracic Vascular Surgery)

## 2020-07-03 ENCOUNTER — Institutional Professional Consult (permissible substitution): Payer: Medicare Other | Admitting: Thoracic Surgery (Cardiothoracic Vascular Surgery)

## 2020-07-03 VITALS — BP 127/68 | HR 85 | Resp 20 | Ht 62.0 in | Wt 118.0 lb

## 2020-07-03 DIAGNOSIS — F17210 Nicotine dependence, cigarettes, uncomplicated: Secondary | ICD-10-CM | POA: Insufficient documentation

## 2020-07-03 DIAGNOSIS — Z171 Estrogen receptor negative status [ER-]: Secondary | ICD-10-CM | POA: Diagnosis not present

## 2020-07-03 DIAGNOSIS — J449 Chronic obstructive pulmonary disease, unspecified: Secondary | ICD-10-CM | POA: Diagnosis not present

## 2020-07-03 DIAGNOSIS — C50312 Malignant neoplasm of lower-inner quadrant of left female breast: Secondary | ICD-10-CM | POA: Diagnosis not present

## 2020-07-03 DIAGNOSIS — R911 Solitary pulmonary nodule: Secondary | ICD-10-CM | POA: Diagnosis not present

## 2020-07-03 LAB — PULMONARY FUNCTION TEST
DL/VA % pred: 80 %
DL/VA: 3.29 ml/min/mmHg/L
DLCO unc % pred: 39 %
DLCO unc: 6.89 ml/min/mmHg
FEF 25-75 Post: 0.97 L/sec
FEF 25-75 Pre: 1.27 L/sec
FEF2575-%Change-Post: -23 %
FEF2575-%Pred-Post: 94 %
FEF2575-%Pred-Pre: 122 %
FEV1-%Change-Post: -4 %
FEV1-%Pred-Post: 78 %
FEV1-%Pred-Pre: 82 %
FEV1-Post: 1.23 L
FEV1-Pre: 1.3 L
FEV1FVC-%Change-Post: -4 %
FEV1FVC-%Pred-Pre: 108 %
FEV6-%Change-Post: -2 %
FEV6-%Pred-Post: 79 %
FEV6-%Pred-Pre: 81 %
FEV6-Post: 1.59 L
FEV6-Pre: 1.64 L
FEV6FVC-%Pred-Post: 106 %
FEV6FVC-%Pred-Pre: 106 %
FVC-%Change-Post: 0 %
FVC-%Pred-Post: 75 %
FVC-%Pred-Pre: 76 %
FVC-Post: 1.63 L
FVC-Pre: 1.64 L
Post FEV1/FVC ratio: 76 %
Post FEV6/FVC ratio: 100 %
Pre FEV1/FVC ratio: 79 %
Pre FEV6/FVC Ratio: 100 %
RV % pred: 115 %
RV: 2.77 L
TLC % pred: 96 %
TLC: 4.59 L

## 2020-07-03 MED ORDER — ALBUTEROL SULFATE (2.5 MG/3ML) 0.083% IN NEBU
2.5000 mg | INHALATION_SOLUTION | Freq: Once | RESPIRATORY_TRACT | Status: AC
  Administered 2020-07-03: 2.5 mg via RESPIRATORY_TRACT

## 2020-07-03 NOTE — Progress Notes (Signed)
PCP is Redmond School, MD Referring Provider is Derek Jack, MD  Chief Complaint  Patient presents with   Lung Lesion    Surgical consult, Chest CT 03/01/20, PET Scan 05/07/20, PFT's 07/03/20    HPI: [name] is sent for consultation regarding a left upper lobe lung mass.  Margaret Thompson is a 87 year old woman with a history of tobacco abuse, CAD, MI, hypertension, hyperlipidemia, breast cancer, reflux, and gout.  As part of her follow-up for breast cancer she had a CT of the chest in February 2022.  It showed a 2.3 x 1.7 cm spiculated nodule in the lingula crossing the fissure to the lower lobe that had enlarged since 2019.  She then had a PET/CT in April which showed the mass was hypermetabolic.  Dr. Delton Coombes referred her, but she had to wait until the end of the school year to get a ride to come for consultation.  She continues to smoke, currently about 1/2 pack/day.  She will not consider quitting.  She denies any change in appetite or weight loss.  She does have some shortness of breath with exertion but nothing is changed recently.  No chest pain, pressure, or tightness.  She does live alone.  Zubrod Score: At the time of surgery this patient's most appropriate activity status/level should be described as: []   0    Normal activity, no symptoms [x]     1    Restricted in physical strenuous activity but ambulatory, able to do out light work []     2    Ambulatory and capable of self care, unable to do work activities, up and about >50 % of waking hours                              []     3    Only limited self care, in bed greater than 50% of waking hours []     4    Completely disabled, no self care, confined to bed or chair []     5    Moribund  Past Medical History:  Diagnosis Date   Acute MI ([place]) 2003   Adenocarcinoma of left breast ([place]) 11/23/2012   Stage II (T1 C. N1 M0) adenocarcinoma of the left breast with surgery on 03/08/2001. She had a 1.5 cm cancer with 1/6 positive  sentinel nodes. Reexcision margins were clear. She did participate in NSABP B.-30 randomized to Adriamycin and Taxotere for 4 cycles followed by radiation therapy from 07/13/2001 to 08/27/2001. She then started Arimidex on 07/07/2001 took that until the end of June 2008. T   Anemia    Bilateral cataracts    Breast cancer (HCC)    GERD (gastroesophageal reflux disease)    History of gout    Hypercholesteremia    Hypertension    Malignant neoplasm of lower-inner quadrant of left female breast [facility])    Osteoporosis     Past Surgical History:  Procedure Laterality Date   ABDOMINAL HYSTERECTOMY     BREAST SURGERY     CATARACT EXTRACTION W/PHACO Left 09/04/2014   Procedure: CATARACT EXTRACTION PHACO AND INTRAOCULAR LENS PLACEMENT ([place]);  Surgeon: Tonny Branch, MD;  Location: [facility];  Service: Ophthalmology;  Laterality: Left;  CDE:9.84   CATARACT EXTRACTION W/PHACO Right 11/09/2014   Procedure: CATARACT EXTRACTION PHACO AND INTRAOCULAR LENS PLACEMENT ([facility]);  Surgeon: Tonny Branch, MD;  Location: [facility];  Service: Ophthalmology;  Laterality: Right;  CDE:8.40   CORONARY  ANGIOPLASTY     RCA stent   ESOPHAGOGASTRODUODENOSCOPY (EGD) WITH PROPOFOL N/A 08/04/2017   Fields: esophagitis, gastritis, no h.pylori. medium sized hiatal hernia, moderate schatzki ring s/p dilation   LEFT HEART CATHETERIZATION WITH CORONARY ANGIOGRAM N/A 12/21/2012   Procedure: LEFT HEART CATHETERIZATION WITH CORONARY ANGIOGRAM;  Surgeon: Clent Demark, MD;  Location: [place] CATH LAB;  Service: Cardiovascular;  Laterality: N/A;   MASTECTOMY MODIFIED RADICAL Left 01/16/2017   Procedure: SIMPLE MASTECTOMY;  Surgeon: Aviva Signs, MD;  Location: [facility];  Service: General;  Laterality: Left;   MASTECTOMY, PARTIAL Left    PERCUTANEOUS STENT INTERVENTION     RCA   PORT-A-CATH REMOVAL     PORT-A-CATH REMOVAL Right 09/13/2018   Procedure: REMOVAL PORT-A-CATH;  Surgeon: Aviva Signs, MD;  Location: [facility];  Service: General;  Laterality:  Right;   PORTACATH PLACEMENT     PORTACATH PLACEMENT Right 03/11/2017   Procedure: INSERTION PORT-A-CATH;  Surgeon: Aviva Signs, MD;  Location: [facility];  Service: General;  Laterality: Right;   SAVORY DILATION N/A 08/04/2017   Procedure: SAVORY DILATION;  Surgeon: Danie Binder, MD;  Location: [facility];  Service: Endoscopy;  Laterality: N/A;    Family History  Problem Relation Age of Onset   Stroke Mother    Colon cancer Neg Hx    Gastric cancer Neg Hx    Esophageal cancer Neg Hx     Social History Social History   Tobacco Use   Smoking status: Every Day    Packs/day: 0.50    Years: 50.00    Pack years: 25.00    Types: Cigarettes   Smokeless tobacco: Never   Tobacco comments:    10 cigarettes daily  Vaping Use   Vaping Use: Never used  Substance Use Topics   Alcohol use: No   Drug use: No    Current Outpatient Medications  Medication Sig Dispense Refill   aspirin EC 81 MG tablet Take 81 mg by mouth daily.     atorvastatin (LIPITOR) 40 MG tablet Take 40 mg by mouth daily.     diltiazem (CARDIZEM CD) 180 MG 24 hr capsule Take 180 mg by mouth daily.     pantoprazole (PROTONIX) 40 MG tablet Take 1 tablet (40 mg total) by mouth 2 (two) times daily before a meal. 60 tablet 5   No current facility-administered medications for this visit.    Allergies  Allergen Reactions   Azithromycin Anaphylaxis and Hives   Cipro [Ciprofloxacin Hcl]    Gatifloxacin    Levaquin [Levofloxacin]     Review of Systems  Constitutional:  Positive for fatigue. Negative for appetite change.  HENT:  Negative for trouble swallowing and voice change.   Eyes:  Negative for visual disturbance.  Respiratory:  Positive for shortness of breath. Negative for cough and wheezing.   Cardiovascular:  Negative for chest pain and leg swelling.  Gastrointestinal:  Positive for abdominal pain (Reflux).  Genitourinary:  Negative for difficulty urinating and dysuria.  Musculoskeletal:  Negative for  arthralgias and back pain.  Neurological:  Negative for seizures and weakness.  All other systems reviewed and are negative.  BP 127/68   Pulse 85   Resp 20   Ht 5\' 2"  (1.575 m)   Wt 118 lb (53.5 kg)   SpO2 98% Comment: RA  BMI 21.58 kg/m  Physical Exam Constitutional:      General: She is not in acute distress. HENT:     Head: Normocephalic and atraumatic.  Eyes:  General: No scleral icterus.    Extraocular Movements: Extraocular movements intact.  Cardiovascular:     Rate and Rhythm: Normal rate and regular rhythm.     Heart sounds: Normal heart sounds. No murmur heard.   No friction rub. No gallop.  Pulmonary:     Effort: Pulmonary effort is normal. No respiratory distress.     Breath sounds: Wheezing (Faint at bases) present.  Abdominal:     General: There is no distension.     Palpations: Abdomen is soft.  Skin:    General: Skin is warm and dry.  Neurological:     General: No focal deficit present.     Mental Status: She is alert and oriented to person, place, and time.     Cranial Nerves: No cranial nerve deficit.     Motor: No weakness.     Gait: Gait normal.    Diagnostic Tests: CT CHEST WITH CONTRAST   TECHNIQUE: Multidetector CT imaging of the chest was performed during intravenous contrast administration.   CONTRAST:  77mL OMNIPAQUE IOHEXOL 300 MG/ML  SOLN   COMPARISON:  PET-CT 08/17/2017. Chest CT 04/23/2012.   Chest radiographs 01/23/2020.   FINDINGS: Cardiovascular: Calcified aortic atherosclerosis. Calcified coronary artery atherosclerosis and/or stents. Cardiac size remains within normal limits. No pericardial effusion. Mildly tortuous descending thoracic aorta.   Mediastinum/Nodes: Subcentimeter mediastinal lymph nodes appears stable since 2019. No hilar lymphadenopathy. Small to moderate gastric hiatal hernia has not significantly changed.   Lungs/Pleura: Left mastectomy, new since the 2014 CT and stable since 2019. Chronic left  anterior lung subpleural reticular opacity, not significantly changed in most areas of the left lung since 2014 and possibly radiation therapy related.   But confluent nodular and spiculated nodule in the lower lung at the junction of the lingula and lower lobe along the major fissure has substantially enlarged since 2019. The most confluent portion of the lesion now is about 23 by 17 x 17 mm (AP by transverse by CC). See series 4, image 83 and coronal series 5, image 58. There is some regional scarring.   Combined centrilobular and paraseptal emphysema changes have progressed in both the upper lobes and at the costophrenic angles. A small 5 mm right lower lobe subpleural nodule on series 4, image 93 is new since 2014, but that area of lung was atelectatic in 2019.   No other new or suspicious lung nodule. Major airways are patent. Trace retained secretions in the left mainstem bronchus. No pleural effusion.   Upper Abdomen: Negative visible liver, pancreas, adrenal glands, kidneys, and bowel in the upper abdomen. Small chronic round low-density lesion in the lateral spleen (series 2, image 110) appears not significantly changed since 2019, when it lacked hypermetabolism on PET, and might have also been present in 2014 (benign).   Musculoskeletal: Chronic osteopenia. Impacted, comminuted and unhealed fracture of the proximal left humerus is new since January. Surrounding soft tissue swelling. Other visible bilateral shoulder osseous structures appear to remain intact. Thoracic spine appears stable since 2014. No acute or suspicious osseous lesion identified.   IMPRESSION: 1. Enlarged since 2019 and now roughly 2.3 x 1.7 cm left lung nodule at the junction of the lingula and lower lobe is indeterminate but has spiculated margins suspicious for malignancy. No associated mediastinal lymphadenopathy. Recommend referral to [facility]), and  consider one of the following: (a) follow-up PET-CT, (b) tissue sampling, or (c) repeat Chest CT in 3 months. This recommendation adapted the consensus statement: Guidelines  for Management of Incidental Pulmonary Nodules Detected on CT Images: From the Fleischner Society 2017; Radiology 2017; 284:228-243.   2. Acute or subacute comminuted and impacted fracture of the proximal left humerus is new since January.   3. Aortic Atherosclerosis (ICD10-I70.0) and Emphysema (ICD10-J43.9).   These results will be called to the ordering clinician or representative by the Radiologist Assistant, and communication documented in the PACS or [employer].     Electronically Signed   By: Genevie Ann M.D.   On: 03/04/2020 06:59 NUCLEAR MEDICINE PET SKULL BASE TO THIGH   TECHNIQUE: 7.77 mCi F-18 FDG was injected intravenously. Full-ring PET imaging was performed from the skull base to thigh after the radiotracer. CT data was obtained and used for attenuation correction and anatomic localization.   Fasting blood glucose: 89 mg/dl   COMPARISON:  Multiple prior studies most recent chest CT from March 04, 2020 and prior PET exams dating back to 2019.   FINDINGS: Mediastinal blood pool activity: SUV max 2.35   Liver activity: SUV max NA   NECK: No hypermetabolic lymph nodes in the neck.   Incidental CT findings: none   CHEST: Lingular nodule with surrounding septal thickening measuring 1.9 x 1.3 cm accounting for differences in technique is unchanged compared to the CT from February of 2020. No new nodules. Maximum SUV of 5.9 in this area.   Subcentimeter lymph nodes in the anterior mediastinum with activity only slightly beyond mediastinal blood pool with a maximum SUV of 2.8. Size and appearance of these lymph nodes is unchanged as compared to the previous PET scan.   Incidental CT findings: Calcified atheromatous plaque in the thoracic aorta. Nonaneurysmal appearance. Three-vessel  coronary artery disease. Mitral annular calcifications. Heart size normal without pericardial effusion. Normal caliber central pulmonary vessels. Limited assessment of cardiovascular structures given lack of intravenous contrast.   ABDOMEN/PELVIS: No abnormal hypermetabolic activity within the liver, pancreas, adrenal glands, or spleen. No hypermetabolic lymph nodes in the abdomen or pelvis.   Incidental CT findings: Liver, gallbladder, pancreas, spleen, adrenal glands and kidneys without acute process. Urinary bladder is collapsed. Small hiatal hernia. No acute gastrointestinal finding. Colonic diverticulosis. Extensive aortic atherosclerosis as before throughout the abdominal aorta. Post hysterectomy. No ascites.   SKELETON: Healing impacted LEFT humeral fracture with some surrounding activity about the joint likely related to this process. No signs of skeletal metastatic disease.   Incidental CT findings: none   IMPRESSION: Stable appearance of lingular nodule with signs of increased metabolic activity suspicious for primary bronchogenic neoplasm. Suggest referral to multi disciplinary thoracic oncologic setting if not yet performed. Biopsy could be considered.   Stable appearance of anterior mediastinal lymph nodes without significant FDG uptake dating back to March of 2019. No signs of disease elsewhere in the neck, chest, abdomen or pelvis.   Aortic atherosclerosis, coronary artery disease and pulmonary emphysema.   Healing impacted LEFT proximal humeral fracture.   Aortic Atherosclerosis (ICD10-I70.0) and Emphysema (ICD10-J43.9).     Electronically Signed   By: Zetta Bills M.D.   On: 05/08/2020 11:24  I personally reviewed the CT and PET/CT images.  There is a 2.3 x 1.7 cm spiculated nodule in the lingula crossing the fissure involving the lower lobe.  Markedly hypermetabolic.  Pulmonary function testing 07/03/2020 FVC 1.64 (76%) FEV1 1.30 (82%), no change  with bronchodilator DLCO 6.89 (39%)  Impression: Margaret Thompson is an 87 year old woman with a history of tobacco abuse, CAD, MI, hypertension, hyperlipidemia, breast cancer, reflux, and gout.  She was found to have a spiculated nodule in the left lung in February of this year.  Nodule still present and was hypermetabolic on a PET/CT in April.  She was referred consultation at that time but apparently could not make it to [facility] until now.  Her PFTs show good flows, but her diffusion capacity is significantly impaired with a DLCO of 39% of predicted.  This lesion crosses the fissure significantly and would require pneumonectomy for a formal anatomic resection.  She is a poor candidate for surgery in general and is not a candidate for pneumonectomy.  She is not particularly interested in having surgery anyway.  In fact, she is questioning whether she wants to treat this lesion at all.  I reviewed the images with her and her daughter.  I was quite frank that this almost certainly is a new primary bronchogenic carcinoma.  We discussed the possibility of biopsying this lesion.  The options would be CT-guided biopsy versus navigational bronchoscopy.  She was under the impression that we would do a bronchoscopy in the office.  I explained that that was not the way this would be done.  I explained the procedure of navigational bronchoscopy to the patient and her daughter.  She would need a new CT scan done prior to navigation as her previous ones 62 months old.  It would require general anesthesia and would be done in the operating room account hospital.  We would plan to do it on an outpatient basis.  I informed her of the indications, risks, benefits, and alternatives.  She understands the risks include, but are not limited to death, MI, DVT, PE, stroke, bleeding, pneumothorax, and failure to make a diagnosis, as well as possibility of other unforeseeable complications.  She is reluctant to have a biopsy.  She  also was reluctant to talk to radiation oncology.  I advised her to schedule an appointment with radiation oncology and then if they are willing to treat her without a biopsy that is fine with me if they want to have a biopsy we can do a navigational bronchoscopy.  It is quite possible with a new CT scan you might find something that is easier to approach or it might be better to do a needle biopsy.  Plan:  Refer to radiation oncology to discuss possible treatment with or without biopsy  If patient decides to proceed with radiation and a biopsy is needed, she will call and we will schedule electromagnetic navigational bronchoscopy.  Melrose Nakayama, MD [facility] [phone]

## 2020-07-11 NOTE — Progress Notes (Signed)
Location of tumor and Histology per Pathology Report:    Biopsy:  Past/Anticipated interventions by surgeon, if any:   Past/Anticipated interventions by medical oncology, if any: none at this time    Pain issues, if any:  yes, right shoulder pain rated 5/10 intermittent and aching  SAFETY ISSUES: Prior radiation? yes, left breast early 2000s Pacemaker/ICD? no Possible current pregnancy?no Is the patient on methotrexate? no  Current Complaints / other details:  ear pain, fatigue     Vitals:   07/18/20 1426  BP: (!) 111/59  Pulse: 77  Resp: 18  Temp: 97.6 F (36.4 C)  SpO2: 99%  Weight: 114 lb 8 oz (51.9 kg)  Height: 5\' 2"  (1.575 m)

## 2020-07-12 DIAGNOSIS — N1831 Chronic kidney disease, stage 3a: Secondary | ICD-10-CM | POA: Diagnosis not present

## 2020-07-12 DIAGNOSIS — E7849 Other hyperlipidemia: Secondary | ICD-10-CM | POA: Diagnosis not present

## 2020-07-12 DIAGNOSIS — I2510 Atherosclerotic heart disease of native coronary artery without angina pectoris: Secondary | ICD-10-CM | POA: Diagnosis not present

## 2020-07-12 DIAGNOSIS — I129 Hypertensive chronic kidney disease with stage 1 through stage 4 chronic kidney disease, or unspecified chronic kidney disease: Secondary | ICD-10-CM | POA: Diagnosis not present

## 2020-07-16 NOTE — Progress Notes (Signed)
Radiation Oncology         [phone] ________________________________  Initial Outpatient Consultation  Name: Margaret Thompson MRN: [id number]  Date: 07/18/2020  DOB: [birth year]  CC:Redmond School, MD  Melrose Nakayama, *   REFERRING PHYSICIAN: Melrose Nakayama, *  DIAGNOSIS: The primary encounter diagnosis was Nodule of left lung. Diagnoses of History of left breast cancer (2003) and Mass of lingula of lung were also pertinent to this visit.  Left lung pulmonary nodule   History of left breast invasive ductal carcinoma   HISTORY OF PRESENT ILLNESS::Margaret Thompson is a 87 y.o. female who is accompanied by her daughter. she is seen as a courtesy of Dr. Roxan Hockey for an opinion concerning radiation therapy as part of management for her recently diagnosed left lung nodule. The patient presented for a chest CT on 03/01/20 for follow-up of her left breast cancer. Imaging showed a 2.3 x 1.7 cm spiculated left lung nodule in the lingula crossing the fissure to the lower lobe that had enlarged since 2019  The patient followed up with Dr. Delton Coombes on 04/05/20 in regards to her left breast cancer. Dr. Delton Coombes reviewed her recent CT findings of left lower lobe lung nodule and prescribed her Levaquin about 7 days and scheduled a PET scan in 3 to 4 weeks.  PET scan from 05/07/20 showed the left lung mass to be stable in appearance although with signs of increased metabolic activity; noted to be suspicious for primary bronchogenic neoplasm . This prompted Dr. Delton Coombes to refer the patient to Dr. Roxan Hockey for surgical consultation.  The patient met with Dr. Roxan Hockey for surgical consultation on 07/03/20. Dr. Roxan Hockey recommended for the patient to be seen at the [facility] for consideration of either a follow-up PET-CT, tissue sampling, or repeat chest CT in 3 months. The patient expressed her reluctance to pursue biopsy of the lung lesion and  expressed that she is additionally hesitant to talk to radiation radiation oncology.   (Also of note: the patient continues to smoke. Smokes around 1/2 pack per day)   PREVIOUS RADIATION THERAPY: Yes, from 07/13/2001 to 08/27/2001, breast conservation therapy.  The patient had recurrent left breast (mastectomy at a later date.  Radiation details are pending at this time.  PAST MEDICAL HISTORY:  Past Medical History:  Diagnosis Date   Acute MI ([place]) 2003   Adenocarcinoma of left breast ([place]) 11/23/2012   Stage II (T1 C. N1 M0) adenocarcinoma of the left breast with surgery on 03/08/2001. She had a 1.5 cm cancer with 1/6 positive sentinel nodes. Reexcision margins were clear. She did participate in NSABP B.-30 randomized to Adriamycin and Taxotere for 4 cycles followed by radiation therapy from 07/13/2001 to 08/27/2001. She then started Arimidex on 07/07/2001 took that until the end of June 2008. T   Anemia    Bilateral cataracts    Breast cancer (HCC)    GERD (gastroesophageal reflux disease)    History of gout    Hypercholesteremia    Hypertension    Malignant neoplasm of lower-inner quadrant of left female breast ([place])    Osteoporosis     PAST SURGICAL HISTORY: Past Surgical History:  Procedure Laterality Date   ABDOMINAL HYSTERECTOMY     BREAST SURGERY     CATARACT EXTRACTION W/PHACO Left 09/04/2014   Procedure: CATARACT EXTRACTION PHACO AND INTRAOCULAR LENS PLACEMENT ([place]);  Surgeon: Tonny Branch, MD;  Location: [facility];  Service: Ophthalmology;  Laterality: Left;  CDE:9.84   CATARACT EXTRACTION  W/PHACO Right 11/09/2014   Procedure: CATARACT EXTRACTION PHACO AND INTRAOCULAR LENS PLACEMENT ([facility]);  Surgeon: Tonny Branch, MD;  Location: [facility];  Service: Ophthalmology;  Laterality: Right;  CDE:8.40   CORONARY ANGIOPLASTY     RCA stent   ESOPHAGOGASTRODUODENOSCOPY (EGD) WITH PROPOFOL N/A 08/04/2017   Fields: esophagitis, gastritis, no h.pylori. medium sized hiatal hernia, moderate  schatzki ring s/p dilation   LEFT HEART CATHETERIZATION WITH CORONARY ANGIOGRAM N/A 12/21/2012   Procedure: LEFT HEART CATHETERIZATION WITH CORONARY ANGIOGRAM;  Surgeon: Clent Demark, MD;  Location: [facility] CATH [facility];  Service: Cardiovascular;  Laterality: N/A;   MASTECTOMY MODIFIED RADICAL Left 01/16/2017   Procedure: SIMPLE MASTECTOMY;  Surgeon: Aviva Signs, MD;  Location: [facility];  Service: General;  Laterality: Left;   MASTECTOMY, PARTIAL Left    PERCUTANEOUS STENT INTERVENTION     RCA   PORT-A-CATH REMOVAL     PORT-A-CATH REMOVAL Right 09/13/2018   Procedure: REMOVAL PORT-A-CATH;  Surgeon: Aviva Signs, MD;  Location: [facility];  Service: General;  Laterality: Right;   PORTACATH PLACEMENT     PORTACATH PLACEMENT Right 03/11/2017   Procedure: INSERTION PORT-A-CATH;  Surgeon: Aviva Signs, MD;  Location: [facility];  Service: General;  Laterality: Right;   SAVORY DILATION N/A 08/04/2017   Procedure: SAVORY DILATION;  Surgeon: Danie Binder, MD;  Location: [facility];  Service: Endoscopy;  Laterality: N/A;    FAMILY HISTORY:  Family History  Problem Relation Age of Onset   Stroke Mother    Colon cancer Neg Hx    Gastric cancer Neg Hx    Esophageal cancer Neg Hx     SOCIAL HISTORY:  Social History   Tobacco Use   Smoking status: Every Day    Packs/day: 0.50    Years: 50.00    Pack years: 25.00    Types: Cigarettes   Smokeless tobacco: Never   Tobacco comments:    10 cigarettes daily  Vaping Use   Vaping Use: Never used  Substance Use Topics   Alcohol use: No   Drug use: No    ALLERGIES:  Allergies  Allergen Reactions   Azithromycin Anaphylaxis and Hives   Cipro [Ciprofloxacin Hcl]    Gatifloxacin    Levaquin [Levofloxacin]     MEDICATIONS:  Current Outpatient Medications  Medication Sig Dispense Refill   aspirin EC 81 MG tablet Take 81 mg by mouth daily.     atorvastatin (LIPITOR) 40 MG tablet Take 40 mg by mouth daily.     diltiazem (CARDIZEM CD) 180 MG 24 hr  capsule Take 180 mg by mouth daily.     pantoprazole (PROTONIX) 40 MG tablet Take 1 tablet (40 mg total) by mouth 2 (two) times daily before a meal. 60 tablet 5   No current facility-administered medications for this encounter.    REVIEW OF SYSTEMS:  A 10+ POINT REVIEW OF SYSTEMS WAS OBTAINED including neurology, dermatology, psychiatry, cardiac, respiratory, lymph, extremities, GI, GU, musculoskeletal, constitutional, reproductive, HEENT.  She denies any headaches or visual problems.  She does complain of right ear pain and planning on seeing a ear nose and throat physician for this issue.  She denies any significant cough chest pain or hemoptysis.   PHYSICAL EXAM:  height is 5' 2"  (1.575 m) and weight is 114 lb 8 oz (51.9 kg). Her temperature is 97.6 F (36.4 C). Her blood pressure is 111/59 (abnormal) and her pulse is 77. Her respiration is 18 and oxygen saturation is 99%.   General: Alert and  oriented, in no acute distress HEENT: Head is normocephalic. Extraocular movements are intact.  Neck: Neck is supple, no palpable cervical or supraclavicular lymphadenopathy. Heart: Regular in rate and rhythm with no murmurs, rubs, or gallops. Chest: Clear to auscultation bilaterally, with no rhonchi, wheezes, or rales. Abdomen: Soft, nontender, nondistended, with no rigidity or guarding. Extremities: No cyanosis or edema. Lymphatics: see Neck Exam Skin: No concerning lesions. Musculoskeletal: symmetric strength and muscle tone throughout.  Limited shoulder mobility along the left and right side Neurologic: Cranial nerves II through XII are grossly intact. No obvious focalities. Speech is fluent. Coordination is intact. Psychiatric: Judgment and insight are intact. Affect is appropriate. Right Breast: no palpable mass, nipple discharge or bleeding.  Left chest wall area shows a mastectomy scar without palpable or visible signs of recurrence.  Tattoos in place from her prior left breast radiation  therapy.    ECOG = 1  0 - Asymptomatic (Fully active, able to carry on all predisease activities without restriction)  1 - Symptomatic but completely ambulatory (Restricted in physically strenuous activity but ambulatory and able to carry out work of a light or sedentary nature. For example, light housework, office work)  2 - Symptomatic, <50% in bed during the day (Ambulatory and capable of all self care but unable to carry out any work activities. Up and about more than 50% of waking hours)  3 - Symptomatic, >50% in bed, but not bedbound (Capable of only limited self-care, confined to bed or chair 50% or more of waking hours)  4 - Bedbound (Completely disabled. Cannot carry on any self-care. Totally confined to bed or chair)  5 - Death   Eustace Pen MM, Creech RH, Tormey DC, et al. [phone]). "Toxicity and response criteria of the [facility]". [place] Oncol. 5 (6): 649-55  LABORATORY DATA:  Lab Results  Component Value Date   WBC 7.9 03/01/2020   HGB 13.0 03/01/2020   HCT 40.5 03/01/2020   MCV 97.6 03/01/2020   PLT 299 03/01/2020   NEUTROABS 4.3 03/01/2020   Lab Results  Component Value Date   NA 133 (L) 03/01/2020   K 3.6 03/01/2020   CL 100 03/01/2020   CO2 25 03/01/2020   GLUCOSE 94 03/01/2020   CREATININE 0.81 03/01/2020   CALCIUM 9.1 03/01/2020      RADIOGRAPHY: No results found.    IMPRESSION: Left lung pulmonary nodule located primarily in the lingula of the left lobe with limited extension into the left lower lobe  Imaging characteristics are most consistent with primary bronchogenic carcinoma.  Alternatively this could be recurrence of her breast cancer but less likely.  Stage I left breast invasive ductal carcinoma (simple mastectomy received on 01/16/17)  Talked about management of this issue.  Would prefer to have tissue to confirm diagnosis particularly with her prior history of left breast cancer.  If this were to be breast cancer  management would be completely different than clinical stage I non-small cell lung cancer.  We discussed potential approaches to biopsy including navigational Bronchoscopy and CT-guided biopsy.  Both procedures would have inherent risks with anesthesia associated with bronchoscopy and pneumothorax primarily with CT-guided biopsy.  Patient does have limited shoulder mobility after she "broke" her left shoulder earlier this year.  She is also had prior left anterior chest radiation therapy as part of her left sided breast cancer treatment.  She also has limited mobility of her right arm and shoulder.  Technically this would be a challenge to  be treated with SBRT but conceivable.  Alternatively the patient would be a candidate for hypofractionated accelerated radiation therapy over approximately 10 treatments.  Today, I talked to the patient and daughter about the findings and work-up thus far.  We discussed the natural history of presumptive non-small cell lung cancer and general treatment, highlighting the role of radiotherapy in the management.  We discussed the available radiation techniques, and focused on the details of logistics and delivery.  We reviewed the anticipated acute and late sequelae associated with radiation in this setting.  The patient was encouraged to ask questions that I answered to the best of my ability.  At this point patient is undecided whether she would like to proceed with biopsy confirmation of her malignancy.  She will discuss this with family members and make a decision concerning this.  When she has made this decision she will contact us to arrange for bronchoscopy or CT-guided biopsy.  If she decides to not pursue tissue confirmation then I may consider treatment without tissue diagnosis.  PLAN: Treatment plan pending patient's decision   60 minutes of total time was spent for this patient encounter, including preparation, face-to-face counseling with the patient and  coordination of care, physical exam, and documentation of the encounter.   ------------------------------------------------  Blair Promise, PhD, MD  This document serves as a record of services personally performed by Gery Pray, MD. It was created on his behalf by Roney Mans, a trained medical scribe. The creation of this record is based on the scribe's personal observations and the provider's statements to them. This document has been checked and approved by the attending provider.

## 2020-07-18 ENCOUNTER — Ambulatory Visit
Admission: RE | Admit: 2020-07-18 | Discharge: 2020-07-18 | Disposition: A | Discharge status: Home / Self Care | Payer: Medicare Other | Source: Ambulatory Visit | Attending: Radiation Oncology | Admitting: Radiation Oncology

## 2020-07-18 ENCOUNTER — Encounter: Payer: Self-pay | Admitting: Radiation Oncology

## 2020-07-18 ENCOUNTER — Other Ambulatory Visit: Payer: Self-pay

## 2020-07-18 VITALS — BP 111/59 | HR 77 | Temp 97.6°F | Resp 18 | Ht 62.0 in | Wt 114.5 lb

## 2020-07-18 DIAGNOSIS — M810 Age-related osteoporosis without current pathological fracture: Secondary | ICD-10-CM | POA: Diagnosis not present

## 2020-07-18 DIAGNOSIS — R911 Solitary pulmonary nodule: Secondary | ICD-10-CM

## 2020-07-18 DIAGNOSIS — Z79899 Other long term (current) drug therapy: Secondary | ICD-10-CM | POA: Diagnosis not present

## 2020-07-18 DIAGNOSIS — Z923 Personal history of irradiation: Secondary | ICD-10-CM | POA: Insufficient documentation

## 2020-07-18 DIAGNOSIS — I10 Essential (primary) hypertension: Secondary | ICD-10-CM | POA: Diagnosis not present

## 2020-07-18 DIAGNOSIS — F17210 Nicotine dependence, cigarettes, uncomplicated: Secondary | ICD-10-CM | POA: Insufficient documentation

## 2020-07-18 DIAGNOSIS — E7800 Pure hypercholesterolemia, unspecified: Secondary | ICD-10-CM | POA: Insufficient documentation

## 2020-07-18 DIAGNOSIS — K219 Gastro-esophageal reflux disease without esophagitis: Secondary | ICD-10-CM | POA: Insufficient documentation

## 2020-07-18 DIAGNOSIS — R918 Other nonspecific abnormal finding of lung field: Secondary | ICD-10-CM | POA: Diagnosis not present

## 2020-07-18 DIAGNOSIS — Z9012 Acquired absence of left breast and nipple: Secondary | ICD-10-CM | POA: Diagnosis not present

## 2020-07-18 DIAGNOSIS — Z853 Personal history of malignant neoplasm of breast: Secondary | ICD-10-CM | POA: Diagnosis not present

## 2020-07-18 DIAGNOSIS — Z7982 Long term (current) use of aspirin: Secondary | ICD-10-CM | POA: Diagnosis not present

## 2020-07-18 NOTE — Progress Notes (Signed)
See MD note for nursing evaluation. °

## 2020-07-31 DIAGNOSIS — E538 Deficiency of other specified B group vitamins: Secondary | ICD-10-CM | POA: Diagnosis not present

## 2020-08-16 DIAGNOSIS — E785 Hyperlipidemia, unspecified: Secondary | ICD-10-CM | POA: Diagnosis not present

## 2020-08-16 DIAGNOSIS — I10 Essential (primary) hypertension: Secondary | ICD-10-CM | POA: Diagnosis not present

## 2020-08-16 DIAGNOSIS — F17290 Nicotine dependence, other tobacco product, uncomplicated: Secondary | ICD-10-CM | POA: Diagnosis not present

## 2020-08-16 DIAGNOSIS — I2510 Atherosclerotic heart disease of native coronary artery without angina pectoris: Secondary | ICD-10-CM | POA: Diagnosis not present

## 2020-08-21 ENCOUNTER — Ambulatory Visit (HOSPITAL_COMMUNITY): Payer: Medicare Other | Admitting: Hematology

## 2020-08-21 ENCOUNTER — Encounter (HOSPITAL_COMMUNITY): Payer: Self-pay | Admitting: Hematology and Oncology

## 2020-08-21 ENCOUNTER — Inpatient Hospital Stay (HOSPITAL_COMMUNITY): Payer: Medicare Other | Attending: Hematology and Oncology | Admitting: Hematology and Oncology

## 2020-08-21 ENCOUNTER — Other Ambulatory Visit: Payer: Self-pay

## 2020-08-21 VITALS — BP 117/59 | HR 83 | Temp 97.6°F | Resp 18 | Wt 114.4 lb

## 2020-08-21 DIAGNOSIS — Z9012 Acquired absence of left breast and nipple: Secondary | ICD-10-CM | POA: Diagnosis not present

## 2020-08-21 DIAGNOSIS — C50312 Malignant neoplasm of lower-inner quadrant of left female breast: Secondary | ICD-10-CM

## 2020-08-21 DIAGNOSIS — M810 Age-related osteoporosis without current pathological fracture: Secondary | ICD-10-CM | POA: Insufficient documentation

## 2020-08-21 DIAGNOSIS — E559 Vitamin D deficiency, unspecified: Secondary | ICD-10-CM | POA: Insufficient documentation

## 2020-08-21 DIAGNOSIS — Z171 Estrogen receptor negative status [ER-]: Secondary | ICD-10-CM

## 2020-08-21 DIAGNOSIS — Z853 Personal history of malignant neoplasm of breast: Secondary | ICD-10-CM | POA: Diagnosis not present

## 2020-08-21 DIAGNOSIS — C50912 Malignant neoplasm of unspecified site of left female breast: Secondary | ICD-10-CM

## 2020-08-21 NOTE — Progress Notes (Signed)
[facility] [address] [place] [address]   Patient Care Team: Redmond School, MD as PCP - General (Internal Medicine) Danie Binder, MD (Inactive) as Consulting Physician (Gastroenterology) Eloise Harman, DO as Consulting Physician (Internal Medicine)  SUMMARY OF ONCOLOGIC HISTORY: Oncology History  Malignant neoplasm of left female breast [facility])  08/21/2016 Pathology Results   Left needle core biopsy, lower-inner quadrant: invasive ductal carcinoma and DCIS.  ER/PR-, HER2-.    01/16/2017 Surgery   Left simple mastectomy by Dr. Alean Rinne ductal carcinoma, grade 3, measuring 1.8 cm with DCIS (high-grade).  Lymphovascular invasion is identified.  Clear surgical margins.     CHIEF COMPLIANT: Follow-up for left breast cancer   INTERVAL HISTORY: [name] is a 87 y.o. female here today for follow up of her left breast cancer. Her last visit was on 05/21/2020.   On exam today [name] was evaluated by radiation oncology on 07/18/2020 but reports that she was not found to be a candidate.  She also evaluated by pulmonology but after discussion with her about the risks of pneumothorax and other complications she did not wish to pursue biopsy.  She notes she is also having some continued difficulty with pain in her left shoulder when she suffered a fracture at the end of January.  She is not been having any issues with cough, productive sputum, shortness of breath, or other new symptoms.  Her weight has been relatively stable and is at 114 pounds today.  She denies any fevers, chills, sweats, nausea, vomiting or diarrhea.  A full 10 point ROS is listed below.  REVIEW OF SYSTEMS:   Review of Systems  Constitutional:  Positive for appetite change (50%) and fatigue (25%).  All other systems reviewed and are negative.  I have reviewed the past medical history, past surgical history, social history and family history with the patient and they are unchanged  from previous note.    ALLERGIES:   is allergic to azithromycin, cipro [ciprofloxacin hcl], gatifloxacin, and levaquin [levofloxacin].   MEDICATIONS:  Current Outpatient Medications  Medication Sig Dispense Refill   acetaminophen (TYLENOL) 325 MG tablet Take 650 mg by mouth every 6 (six) hours as needed.     aspirin EC 81 MG tablet Take 81 mg by mouth daily.     atorvastatin (LIPITOR) 40 MG tablet Take 40 mg by mouth daily.     diltiazem (CARDIZEM CD) 180 MG 24 hr capsule Take 180 mg by mouth daily.     pantoprazole (PROTONIX) 40 MG tablet Take 1 tablet (40 mg total) by mouth 2 (two) times daily before a meal. 60 tablet 5   No current facility-administered medications for this visit.     PHYSICAL EXAMINATION: Performance status (ECOG): 1 - Symptomatic but completely ambulatory  Vitals:   08/21/20 1413  BP: (!) 117/59  Pulse: 83  Resp: 18  Temp: 97.6 F (36.4 C)  SpO2: 99%   Wt Readings from Last 3 Encounters:  08/21/20 114 lb 6.7 oz (51.9 kg)  07/18/20 114 lb 8 oz (51.9 kg)  07/03/20 118 lb (53.5 kg)   Physical Exam Vitals reviewed.  Constitutional:      Appearance: Normal appearance.  Cardiovascular:     Rate and Rhythm: Normal rate and regular rhythm.     Pulses: Normal pulses.     Heart sounds: Normal heart sounds.  Pulmonary:     Effort: Pulmonary effort is normal.     Breath sounds: Normal  breath sounds.  Neurological:     General: No focal deficit present.     Mental Status: She is alert and oriented to person, place, and time.  Psychiatric:        Mood and Affect: Mood normal.        Behavior: Behavior normal.    Breast Exam Chaperone: Milinda Antis, MD     LABORATORY DATA:  I have reviewed the data as listed CMP Latest Ref Rng & Units 03/01/2020 09/05/2019 01/26/2019  Glucose 70 - 99 mg/dL 94 110(H) 98  BUN 8 - 23 mg/dL 10 10 10   Creatinine 0.44 - 1.00 mg/dL 0.81 0.91 0.79  Sodium 135 - 145 mmol/L 133(L) 135 139  Potassium 3.5 - 5.1 mmol/L 3.6  3.6 3.7  Chloride 98 - 111 mmol/L 100 104 104  CO2 22 - 32 mmol/L 25 24 27   Calcium 8.9 - 10.3 mg/dL 9.1 9.0 9.3  Total Protein 6.5 - 8.1 g/dL 7.0 7.3 7.6  Total Bilirubin 0.3 - 1.2 mg/dL 0.5 0.6 0.4  Alkaline Phos 38 - 126 U/L 123 99 78  AST 15 - 41 U/L 17 16 14(L)  ALT 0 - 44 U/L 13 10 11    No results found for: RFF638 Lab Results  Component Value Date   WBC 7.9 03/01/2020   HGB 13.0 03/01/2020   HCT 40.5 03/01/2020   MCV 97.6 03/01/2020   PLT 299 03/01/2020   NEUTROABS 4.3 03/01/2020    ASSESSMENT:  1.  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 Arnoldo Morale on 01/16/2017 showing a 1.8 cm primary tumor, grade 3, LVI positive, clear surgical margins, with high-grade DCIS.  Lymph nodes cannot be removed due to prior history of left breast cancer treatment. -PET scan on 03/27/2017 showed mildly hypermetabolic small prevascular lymph node and right lower peritracheal lymph node indeterminate.  Moderate metabolic activity associated with ill defined left upper lobe nodule. -PET scan on 08/15/2017 showed mild reduction in size and activity of the left and left upper lobe pulmonary nodules, favoring benign etiology.  Stable small prevascular lymph nodes and reduced size and activity of the lower pretracheal lymph node.  Overall suggest reactive/inflammatory etiology. -Adjuvant chemotherapy was not recommended as more than 7 months have elapsed since her surgery. -She reports mild tenderness at the mastectomy site which is stable.  She also reports tightness at times. -She was given a referral to remove her Port-A-Cath at her last visit. -Last mammogram done on 09/02/2019 was BI-RADS Category 1 negative.     2.  History of node positive left breast cancer: -Diagnosed on 02/16/2001, T1 CN1M0, stage IIa, ER/PR positive, HER-2 negative, 1.5 cm, 1 out of 6 lymph nodes positive.   3.  Osteoporosis: -Last DEXA scan done on 09/02/2019 showed a T score of  -3.4   4.  Vitamin D deficiency: -Labs done on 09/05/2019 showed vitamin D level 24.64   PLAN:  1.  Stage I (PT1CNX) left breast IDC, triple receptor negative: -Left chest wall has no palpable masses when checked at last visit. - She will have mammogram on September 10, 2020   2.   PET positive left lung nodule: -CT scan from 03/01/2020 showed 2.3 x 1.7 cm left lung nodule at the junction of the lingula and left lower lobe with spiculated margins suspicious for malignancy.  No adenopathy noted. - She is current active smoker.  We have given her a trial of Levaquin. - We reviewed PET scan  from 05/07/2020 which showed 1.9 x 1.3 cm lingular nodule with SUV 5.9.  Subcentimeter lymph nodes in the anterior mediastinum with activity only slightly beyond mediastinal blood pool with SUV 2.8.  Size and appearance of these lymph node is unchanged from the previous PET scan. - patient met with Dr. Earney Hamburg on 07/18/2020. He noted with her shoulder trouble she would be a difficult, but still feasible candidate for radiation therapy.  - patient met with Dr. Roxan Hockey who discussed risks/benefits of biopsy. Patient opted against biopsy.  - repeat CT scan to see how the lesion is behaving. Can re-refer to radiation to see if improved arm mobility makes her a better candidate for this treatment.    3.  Osteoporosis: -Continue calcium and vitamin D supplements.   4.  Vitamin D deficiency: -Continue vitamin D 2000 units daily.  Breast Cancer therapy associated bone loss: I have recommended calcium, Vitamin D and weight bearing exercises.  Orders placed this encounter:  No orders of the defined types were placed in this encounter.  The patient has a good understanding of the overall plan. She agrees with it. She will call with any problems that may develop before the next visit here.  Ledell Peoples, MD Department of Hematology/Oncology [place] [facility] Phone:  [phone] Pager: [phone] Email: [email or web address]

## 2020-08-23 ENCOUNTER — Other Ambulatory Visit (HOSPITAL_COMMUNITY): Payer: Self-pay

## 2020-08-23 DIAGNOSIS — Z171 Estrogen receptor negative status [ER-]: Secondary | ICD-10-CM

## 2020-08-23 DIAGNOSIS — C50312 Malignant neoplasm of lower-inner quadrant of left female breast: Secondary | ICD-10-CM

## 2020-08-23 DIAGNOSIS — C50912 Malignant neoplasm of unspecified site of left female breast: Secondary | ICD-10-CM

## 2020-08-23 DIAGNOSIS — M810 Age-related osteoporosis without current pathological fracture: Secondary | ICD-10-CM

## 2020-09-10 ENCOUNTER — Other Ambulatory Visit: Payer: Self-pay

## 2020-09-10 ENCOUNTER — Ambulatory Visit (HOSPITAL_COMMUNITY)
Admission: RE | Admit: 2020-09-10 | Discharge: 2020-09-10 | Disposition: A | Discharge status: Home / Self Care | Payer: Medicare Other | Source: Ambulatory Visit | Attending: Hematology | Admitting: Hematology

## 2020-09-10 DIAGNOSIS — Z853 Personal history of malignant neoplasm of breast: Secondary | ICD-10-CM | POA: Diagnosis not present

## 2020-09-10 DIAGNOSIS — Z1231 Encounter for screening mammogram for malignant neoplasm of breast: Secondary | ICD-10-CM | POA: Insufficient documentation

## 2020-09-10 DIAGNOSIS — Z171 Estrogen receptor negative status [ER-]: Secondary | ICD-10-CM

## 2020-09-10 DIAGNOSIS — C50312 Malignant neoplasm of lower-inner quadrant of left female breast: Secondary | ICD-10-CM

## 2020-09-19 DIAGNOSIS — E538 Deficiency of other specified B group vitamins: Secondary | ICD-10-CM | POA: Diagnosis not present

## 2020-10-22 DIAGNOSIS — Z23 Encounter for immunization: Secondary | ICD-10-CM | POA: Diagnosis not present

## 2020-11-15 DIAGNOSIS — I10 Essential (primary) hypertension: Secondary | ICD-10-CM | POA: Diagnosis not present

## 2020-11-15 DIAGNOSIS — I2510 Atherosclerotic heart disease of native coronary artery without angina pectoris: Secondary | ICD-10-CM | POA: Diagnosis not present

## 2020-11-15 DIAGNOSIS — E785 Hyperlipidemia, unspecified: Secondary | ICD-10-CM | POA: Diagnosis not present

## 2020-11-21 ENCOUNTER — Other Ambulatory Visit: Payer: Self-pay

## 2020-11-21 ENCOUNTER — Other Ambulatory Visit (HOSPITAL_COMMUNITY): Payer: Medicare Other

## 2020-11-21 ENCOUNTER — Inpatient Hospital Stay (HOSPITAL_COMMUNITY): Payer: Medicare Other | Attending: Hematology

## 2020-11-21 ENCOUNTER — Ambulatory Visit (HOSPITAL_COMMUNITY)
Admission: RE | Admit: 2020-11-21 | Discharge: 2020-11-21 | Disposition: A | Discharge status: Home / Self Care | Payer: Medicare Other | Source: Ambulatory Visit | Attending: Hematology and Oncology | Admitting: Hematology and Oncology

## 2020-11-21 ENCOUNTER — Encounter (HOSPITAL_COMMUNITY): Payer: Self-pay | Admitting: Radiology

## 2020-11-21 DIAGNOSIS — R911 Solitary pulmonary nodule: Secondary | ICD-10-CM | POA: Insufficient documentation

## 2020-11-21 DIAGNOSIS — Z79899 Other long term (current) drug therapy: Secondary | ICD-10-CM | POA: Insufficient documentation

## 2020-11-21 DIAGNOSIS — M810 Age-related osteoporosis without current pathological fracture: Secondary | ICD-10-CM | POA: Insufficient documentation

## 2020-11-21 DIAGNOSIS — Z171 Estrogen receptor negative status [ER-]: Secondary | ICD-10-CM

## 2020-11-21 DIAGNOSIS — E559 Vitamin D deficiency, unspecified: Secondary | ICD-10-CM | POA: Insufficient documentation

## 2020-11-21 DIAGNOSIS — G479 Sleep disorder, unspecified: Secondary | ICD-10-CM | POA: Insufficient documentation

## 2020-11-21 DIAGNOSIS — C50312 Malignant neoplasm of lower-inner quadrant of left female breast: Secondary | ICD-10-CM | POA: Diagnosis not present

## 2020-11-21 DIAGNOSIS — Z9012 Acquired absence of left breast and nipple: Secondary | ICD-10-CM | POA: Insufficient documentation

## 2020-11-21 DIAGNOSIS — F32A Depression, unspecified: Secondary | ICD-10-CM | POA: Insufficient documentation

## 2020-11-21 DIAGNOSIS — R0602 Shortness of breath: Secondary | ICD-10-CM | POA: Insufficient documentation

## 2020-11-21 DIAGNOSIS — I700 Atherosclerosis of aorta: Secondary | ICD-10-CM | POA: Diagnosis not present

## 2020-11-21 DIAGNOSIS — J439 Emphysema, unspecified: Secondary | ICD-10-CM | POA: Diagnosis not present

## 2020-11-21 DIAGNOSIS — Z853 Personal history of malignant neoplasm of breast: Secondary | ICD-10-CM | POA: Insufficient documentation

## 2020-11-21 DIAGNOSIS — C50912 Malignant neoplasm of unspecified site of left female breast: Secondary | ICD-10-CM

## 2020-11-21 LAB — COMPREHENSIVE METABOLIC PANEL
ALT: 11 U/L (ref 0–44)
AST: 16 U/L (ref 15–41)
Albumin: 4.1 g/dL (ref 3.5–5.0)
Alkaline Phosphatase: 107 U/L (ref 38–126)
Anion gap: 11 (ref 5–15)
BUN: 8 mg/dL (ref 8–23)
CO2: 26 mmol/L (ref 22–32)
Calcium: 9.4 mg/dL (ref 8.9–10.3)
Chloride: 98 mmol/L (ref 98–111)
Creatinine, Ser: 0.95 mg/dL (ref 0.44–1.00)
GFR, Estimated: 59 mL/min — ABNORMAL LOW (ref >60)
Glucose, Bld: 103 mg/dL — ABNORMAL HIGH (ref 70–99)
Potassium: 3.9 mmol/L (ref 3.5–5.1)
Sodium: 135 mmol/L (ref 135–145)
Total Bilirubin: 0.8 mg/dL (ref 0.3–1.2)
Total Protein: 8 g/dL (ref 6.5–8.1)

## 2020-11-21 LAB — VITAMIN B12: Vitamin B-12: 365 pg/mL (ref 180–914)

## 2020-11-21 LAB — CBC WITH DIFFERENTIAL/PLATELET
Abs Immature Granulocytes: 0.03 10*3/uL (ref 0.00–0.07)
Basophils Absolute: 0.1 10*3/uL (ref 0.0–0.1)
Basophils Relative: 1 %
Eosinophils Absolute: 0.1 10*3/uL (ref 0.0–0.5)
Eosinophils Relative: 1 %
HCT: 44.2 % (ref 36.0–46.0)
Hemoglobin: 14.5 g/dL (ref 12.0–15.0)
Immature Granulocytes: 0 %
Lymphocytes Relative: 29 %
Lymphs Abs: 2.5 10*3/uL (ref 0.7–4.0)
MCH: 31.3 pg (ref 26.0–34.0)
MCHC: 32.8 g/dL (ref 30.0–36.0)
MCV: 95.3 fL (ref 80.0–100.0)
Monocytes Absolute: 0.6 10*3/uL (ref 0.1–1.0)
Monocytes Relative: 7 %
Neutro Abs: 5.3 10*3/uL (ref 1.7–7.7)
Neutrophils Relative %: 62 %
Platelets: 289 10*3/uL (ref 150–400)
RBC: 4.64 MIL/uL (ref 3.87–5.11)
RDW: 11.9 % (ref 11.5–15.5)
WBC: 8.5 10*3/uL (ref 4.0–10.5)
nRBC: 0 % (ref 0.0–0.2)

## 2020-11-21 LAB — LACTATE DEHYDROGENASE: LDH: 129 U/L (ref 98–192)

## 2020-11-21 LAB — VITAMIN D 25 HYDROXY (VIT D DEFICIENCY, FRACTURES): Vit D, 25-Hydroxy: 35.79 ng/mL (ref 30–100)

## 2020-11-21 MED ORDER — IOHEXOL 300 MG/ML  SOLN
100.0000 mL | Freq: Once | INTRAMUSCULAR | Status: AC | PRN
  Administered 2020-11-21: 75 mL via INTRAVENOUS

## 2020-11-22 ENCOUNTER — Telehealth (HOSPITAL_COMMUNITY): Payer: Self-pay

## 2020-11-22 NOTE — Telephone Encounter (Signed)
Patient called the clinic with complaints of hives, rash,and  itching after a CT scan with contrast performed on 11/21/2020. Patient states the itching started at midnight, with hives on trunk, and extremities bi-lateral. Patient states her entire body was itching and bright red. Patient denies any facial swelling, or respiratory issues. Reported patient's concerns to [facility], and orders received to instruct patient to take Benadryl for the itching. CT dye added to allergy list in Epic. Instruct patient to present to the ED if symptoms worsen per [facility]. Patient states today the itching and redness is better and in small spots per patient's words.  Patient teaching performed and understanding verbalized.

## 2020-11-26 LAB — POCT I-STAT CREATININE: Creatinine, Ser: 0.9 mg/dL (ref 0.44–1.00)

## 2020-11-26 NOTE — Progress Notes (Signed)
[facility] [address] [place] [address]   Patient Care Team: Redmond School, MD as PCP - General (Internal Medicine) Danie Binder, MD (Inactive) as Consulting Physician (Gastroenterology) Eloise Harman, DO as Consulting Physician (Internal Medicine)  SUMMARY OF ONCOLOGIC HISTORY: Oncology History  Malignant neoplasm of left female breast [facility])  08/21/2016 Pathology Results   Left needle core biopsy, lower-inner quadrant: invasive ductal carcinoma and DCIS.  ER/PR-, HER2-.    01/16/2017 Surgery   Left simple mastectomy by Dr. Alean Rinne ductal carcinoma, grade 3, measuring 1.8 cm with DCIS (high-grade).  Lymphovascular invasion is identified.  Clear surgical margins.     CHIEF COMPLIANT: Follow-up for left breast cancer   INTERVAL HISTORY: Margaret Thompson is a 87 y.o. female here today for follow up of her left breast cancer. Her last visit was on 05/21/2020.   Today she reports feeling well. She denies CP, but she reports left sided chest tightness at her mastectomy site. Her appetite is poor, and she has lost 1.4 pounds since August.   REVIEW OF SYSTEMS:   Review of Systems  Constitutional:  Positive for fatigue (25%) and unexpected weight change (- 1.4 lbs). Negative for appetite change (50%).  Respiratory:  Positive for chest tightness (L side) and shortness of breath.   Cardiovascular:  Negative for chest pain.  Gastrointestinal:  Positive for nausea.  Psychiatric/Behavioral:  Positive for depression and sleep disturbance.   All other systems reviewed and are negative.  I have reviewed the past medical history, past surgical history, social history and family history with the patient and they are unchanged from previous note.   ALLERGIES:   is allergic to azithromycin, ivp dye [iodinated diagnostic agents], cipro [ciprofloxacin hcl], gatifloxacin, and levaquin [levofloxacin].   MEDICATIONS:  Current Outpatient Medications   Medication Sig Dispense Refill   acetaminophen (TYLENOL) 325 MG tablet Take 650 mg by mouth every 6 (six) hours as needed.     aspirin EC 81 MG tablet Take 81 mg by mouth daily.     atorvastatin (LIPITOR) 40 MG tablet Take 40 mg by mouth daily.     diltiazem (CARDIZEM CD) 180 MG 24 hr capsule Take 180 mg by mouth daily.     pantoprazole (PROTONIX) 40 MG tablet Take 1 tablet (40 mg total) by mouth 2 (two) times daily before a meal. 60 tablet 5   No current facility-administered medications for this visit.     PHYSICAL EXAMINATION: Performance status (ECOG): 1 - Symptomatic but completely ambulatory  There were no vitals filed for this visit. Wt Readings from Last 3 Encounters:  08/21/20 114 lb 6.7 oz (51.9 kg)  07/18/20 114 lb 8 oz (51.9 kg)  07/03/20 118 lb (53.5 kg)   Physical Exam Vitals reviewed.  Constitutional:      Appearance: Normal appearance.  Cardiovascular:     Rate and Rhythm: Normal rate and regular rhythm.     Pulses: Normal pulses.     Heart sounds: Normal heart sounds.  Pulmonary:     Effort: Pulmonary effort is normal.     Breath sounds: Normal breath sounds.  Neurological:     General: No focal deficit present.     Mental Status: She is alert and oriented to person, place, and time.  Psychiatric:        Mood and Affect: Mood normal.        Behavior: Behavior normal.    Breast Exam Chaperone: Margaret Thompson  LABORATORY DATA:  I have reviewed the data as listed CMP Latest Ref Rng & Units 11/21/2020 11/21/2020 03/01/2020  Glucose 70 - 99 mg/dL - 103(H) 94  BUN 8 - 23 mg/dL - 8 10  Creatinine 0.44 - 1.00 mg/dL 0.90 0.95 0.81  Sodium 135 - 145 mmol/L - 135 133(L)  Potassium 3.5 - 5.1 mmol/L - 3.9 3.6  Chloride 98 - 111 mmol/L - 98 100  CO2 22 - 32 mmol/L - 26 25  Calcium 8.9 - 10.3 mg/dL - 9.4 9.1  Total Protein 6.5 - 8.1 g/dL - 8.0 7.0  Total Bilirubin 0.3 - 1.2 mg/dL - 0.8 0.5  Alkaline Phos 38 - 126 U/L - 107 123  AST 15 - 41 U/L - 16 17  ALT  0 - 44 U/L - 11 13   No results found for: JQZ009 Lab Results  Component Value Date   WBC 8.5 11/21/2020   HGB 14.5 11/21/2020   HCT 44.2 11/21/2020   MCV 95.3 11/21/2020   PLT 289 11/21/2020   NEUTROABS 5.3 11/21/2020    ASSESSMENT:  1.  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 Arnoldo Morale on 01/16/2017 showing a 1.8 cm primary tumor, grade 3, LVI positive, clear surgical margins, with high-grade DCIS.  Lymph nodes cannot be removed due to prior history of left breast cancer treatment. -PET scan on 03/27/2017 showed mildly hypermetabolic small prevascular lymph node and right lower peritracheal lymph node indeterminate.  Moderate metabolic activity associated with ill defined left upper lobe nodule. -PET scan on 08/15/2017 showed mild reduction in size and activity of the left and left upper lobe pulmonary nodules, favoring benign etiology.  Stable small prevascular lymph nodes and reduced size and activity of the lower pretracheal lymph node.  Overall suggest reactive/inflammatory etiology. -Adjuvant chemotherapy was not recommended as more than 7 months have elapsed since her surgery. -She reports mild tenderness at the mastectomy site which is stable.  She also reports tightness at times. -She was given a referral to remove her Port-A-Cath at her last visit. -Last mammogram done on 09/02/2019 was BI-RADS Category 1 negative.     2.  History of node positive left breast cancer: -Diagnosed on 02/16/2001, T1 CN1M0, stage IIa, ER/PR positive, HER-2 negative, 1.5 cm, 1 out of 6 lymph nodes positive.   3.  Osteoporosis: -Last DEXA scan done on 09/02/2019 showed a T score of -3.4   4.  Vitamin D deficiency: -Labs done on 09/05/2019 showed vitamin D level 24.64   PLAN:  1.  Stage I (PT1CNX) left breast IDC, triple receptor negative: - We have reviewed mammogram from 09/10/2020, BI-RADS Category 1.   2.   PET positive left lung nodule: - She was  evaluated by Dr. Roxan Hockey.  She decided against biopsy. - She was also seen by radiation oncology Dr. Sondra Come. - She does not report any chest wall pain.  She does report decrease in appetite and taste.  She lost about 1 and half pounds since last visit. - Reviewed CT chest images from 11/21/2020 which showed interval enlargement of irregular somewhat spiculated masslike opacity in the left lower lobe.  This measures 4.4 x 2.9 cm.  Unchanged prominent mediastinal lymph nodes, nonspecific. - Recommend a PET CT scan and biopsy. - Recommend imaging of the head.  She reports she has claustrophobia.  We will consider imaging at a later time. - RTC after the PET scan.   3.  Osteoporosis: - Continue  calcium and vitamin D supplements.   4.  Vitamin D deficiency: - Continue vitamin D 2000 units daily.  Vitamin D level was normal.  Breast Cancer therapy associated bone loss: I have recommended calcium, Vitamin D and weight bearing exercises.  Orders placed this encounter:  No orders of the defined types were placed in this encounter.   The patient has a good understanding of the overall plan. She agrees with it. She will call with any problems that may develop before the next visit here.  Derek Jack, MD [place] [phone]   I, Margaret Thompson, am acting as a scribe for Dr. Derek Jack.  I, Derek Jack MD, have reviewed the above documentation for accuracy and completeness, and I agree with the above.

## 2020-11-27 ENCOUNTER — Encounter (HOSPITAL_COMMUNITY): Payer: Self-pay | Admitting: Hematology

## 2020-11-27 ENCOUNTER — Inpatient Hospital Stay (HOSPITAL_BASED_OUTPATIENT_CLINIC_OR_DEPARTMENT_OTHER): Payer: Medicare Other | Admitting: Hematology

## 2020-11-27 DIAGNOSIS — F32A Depression, unspecified: Secondary | ICD-10-CM | POA: Diagnosis not present

## 2020-11-27 DIAGNOSIS — C50312 Malignant neoplasm of lower-inner quadrant of left female breast: Secondary | ICD-10-CM

## 2020-11-27 DIAGNOSIS — M810 Age-related osteoporosis without current pathological fracture: Secondary | ICD-10-CM | POA: Diagnosis not present

## 2020-11-27 DIAGNOSIS — Z171 Estrogen receptor negative status [ER-]: Secondary | ICD-10-CM | POA: Diagnosis not present

## 2020-11-27 DIAGNOSIS — R911 Solitary pulmonary nodule: Secondary | ICD-10-CM | POA: Diagnosis not present

## 2020-11-27 DIAGNOSIS — R0602 Shortness of breath: Secondary | ICD-10-CM | POA: Diagnosis not present

## 2020-11-27 DIAGNOSIS — Z853 Personal history of malignant neoplasm of breast: Secondary | ICD-10-CM | POA: Diagnosis not present

## 2020-11-27 DIAGNOSIS — Z9012 Acquired absence of left breast and nipple: Secondary | ICD-10-CM | POA: Diagnosis not present

## 2020-11-27 DIAGNOSIS — G479 Sleep disorder, unspecified: Secondary | ICD-10-CM | POA: Diagnosis not present

## 2020-11-27 DIAGNOSIS — E559 Vitamin D deficiency, unspecified: Secondary | ICD-10-CM | POA: Diagnosis not present

## 2020-11-27 DIAGNOSIS — Z79899 Other long term (current) drug therapy: Secondary | ICD-10-CM | POA: Diagnosis not present

## 2020-11-27 NOTE — Patient Instructions (Signed)
[place] at [facility] Discharge Instructions  You were seen and examined today by Dr. Delton Coombes. He reviewed your most recent labs and everything looks good. Your scan is showing that the lesion in the lungs is getting larger and is measuring about 2 inches now. Dr. Delton Coombes recommends having a PET scan to see if it has spread to anywhere else.  Please follow up as scheduled.  Thank you for choosing [place] at [facility] to provide your oncology and hematology care.  To afford each patient quality time with our provider, please arrive at least 15 minutes before your scheduled appointment time.   If you have a lab appointment with the [facility] please come in thru the Main Entrance and check in at the main information desk.  You need to re-schedule your appointment should you arrive 10 or more minutes late.  We strive to give you quality time with our providers, and arriving late affects you and other patients whose appointments are after yours.  Also, if you no show three or more times for appointments you may be dismissed from the clinic at the providers discretion.     Again, thank you for choosing [facility].  Our hope is that these requests will decrease the amount of time that you wait before being seen by our physicians.       _____________________________________________________________  Should you have questions after your visit to [facility], please contact our office at [phone] and follow the prompts.  Our office hours are 8:00 a.m. and 4:30 p.m. Monday - Friday.  Please note that voicemails left after 4:00 p.m. may not be returned until the following business day.  We are closed weekends and major holidays.  You do have access to a nurse 24-7, just call the main number to the clinic [phone] and do not press any options, hold on the line and a nurse will answer the phone.    For  prescription refill requests, have your pharmacy contact our office and allow 72 hours.    Due to Covid, you will need to wear a mask upon entering the hospital. If you do not have a mask, a mask will be given to you at the Main Entrance upon arrival. For doctor visits, patients may have 1 support person age 63 or older with them. For treatment visits, patients can not have anyone with them due to social distancing guidelines and our immunocompromised population.

## 2020-12-13 ENCOUNTER — Encounter (HOSPITAL_COMMUNITY)
Admission: RE | Admit: 2020-12-13 | Discharge: 2020-12-13 | Disposition: A | Discharge status: Home / Self Care | Payer: Medicare Other | Source: Ambulatory Visit | Attending: Hematology | Admitting: Hematology

## 2020-12-13 ENCOUNTER — Other Ambulatory Visit: Payer: Self-pay

## 2020-12-13 DIAGNOSIS — C50312 Malignant neoplasm of lower-inner quadrant of left female breast: Secondary | ICD-10-CM | POA: Diagnosis not present

## 2020-12-13 DIAGNOSIS — C50912 Malignant neoplasm of unspecified site of left female breast: Secondary | ICD-10-CM | POA: Diagnosis not present

## 2020-12-13 DIAGNOSIS — Z171 Estrogen receptor negative status [ER-]: Secondary | ICD-10-CM | POA: Diagnosis not present

## 2020-12-13 MED ORDER — FLUDEOXYGLUCOSE F - 18 (FDG) INJECTION
5.9970 | Freq: Once | INTRAVENOUS | Status: AC | PRN
  Administered 2020-12-13: 5.997 via INTRAVENOUS

## 2020-12-16 NOTE — Progress Notes (Shared)
[facility] [address]  [address]   Patient Care Team: Redmond School, MD as PCP - General (Internal Medicine) Danie Binder, MD (Inactive) as Consulting Physician (Gastroenterology) Eloise Harman, DO as Consulting Physician (Internal Medicine)  SUMMARY OF ONCOLOGIC HISTORY: Oncology History  Malignant neoplasm of left female breast [facility])  08/21/2016 Pathology Results   Left needle core biopsy, lower-inner quadrant: invasive ductal carcinoma and DCIS.  ER/PR-, HER2-.    01/16/2017 Surgery   Left simple mastectomy by Dr. Alean Rinne ductal carcinoma, grade 3, measuring 1.8 cm with DCIS (high-grade).  Lymphovascular invasion is identified.  Clear surgical margins.     CHIEF COMPLIANT: ***   INTERVAL HISTORY: Margaret Thompson is a 87 y.o. female here today for follow up of her ***. Her last visit was on {XX/XX/XXXX}. ***   REVIEW OF SYSTEMS:   Review of Systems - Oncology  I have reviewed the past medical history, past surgical history, social history and family history with the patient and they are unchanged from previous note.   ALLERGIES:   is allergic to azithromycin, ivp dye [iodinated diagnostic agents], cipro [ciprofloxacin hcl], gatifloxacin, and levaquin [levofloxacin].   MEDICATIONS:  Current Outpatient Medications  Medication Sig Dispense Refill   acetaminophen (TYLENOL) 325 MG tablet Take 650 mg by mouth every 6 (six) hours as needed.     aspirin EC 81 MG tablet Take 81 mg by mouth daily.     atorvastatin (LIPITOR) 40 MG tablet Take 40 mg by mouth daily.     diltiazem (CARDIZEM CD) 180 MG 24 hr capsule Take 180 mg by mouth daily.     pantoprazole (PROTONIX) 40 MG tablet Take 1 tablet (40 mg total) by mouth 2 (two) times daily before a meal. 60 tablet 5   No current facility-administered medications for this visit.     PHYSICAL EXAMINATION: Performance status (ECOG): {CHL ONC VH:[id number]}  There were no vitals  filed for this visit. Wt Readings from Last 3 Encounters:  08/21/20 114 lb 6.7 oz (51.9 kg)  07/18/20 114 lb 8 oz (51.9 kg)  07/03/20 118 lb (53.5 kg)   Physical Exam  Breast Exam Chaperone: {Blank single:19197::"n/a","Daniel Khashchuk, MD","Kirstyn Evans"}     LABORATORY DATA:  I have reviewed the data as listed CMP Latest Ref Rng & Units 11/21/2020 11/21/2020 03/01/2020  Glucose 70 - 99 mg/dL - 103(H) 94  BUN 8 - 23 mg/dL - 8 10  Creatinine 0.44 - 1.00 mg/dL 0.90 0.95 0.81  Sodium 135 - 145 mmol/L - 135 133(L)  Potassium 3.5 - 5.1 mmol/L - 3.9 3.6  Chloride 98 - 111 mmol/L - 98 100  CO2 22 - 32 mmol/L - 26 25  Calcium 8.9 - 10.3 mg/dL - 9.4 9.1  Total Protein 6.5 - 8.1 g/dL - 8.0 7.0  Total Bilirubin 0.3 - 1.2 mg/dL - 0.8 0.5  Alkaline Phos 38 - 126 U/L - 107 123  AST 15 - 41 U/L - 16 17  ALT 0 - 44 U/L - 11 13   No results found for: UXL244 Lab Results  Component Value Date   WBC 8.5 11/21/2020   HGB 14.5 11/21/2020   HCT 44.2 11/21/2020   MCV 95.3 11/21/2020   PLT 289 11/21/2020   NEUTROABS 5.3 11/21/2020    ASSESSMENT:  ***   PLAN:  ***  Breast Cancer therapy associated bone loss: I have recommended calcium, Vitamin D and weight bearing exercises.  Orders placed this  encounter:  No orders of the defined types were placed in this encounter.   The patient has a good understanding of the overall plan. She agrees with it. She will call with any problems that may develop before the next visit here.  Derek Jack, MD [facility] Main [phone]   I, ***, am acting as a scribe for Dr. Derek Jack.  {Add Barista Statement}

## 2020-12-17 ENCOUNTER — Inpatient Hospital Stay (HOSPITAL_COMMUNITY): Payer: Medicare Other | Attending: Hematology | Admitting: Hematology

## 2021-01-02 ENCOUNTER — Other Ambulatory Visit: Payer: Self-pay

## 2021-01-02 ENCOUNTER — Ambulatory Visit: Payer: Medicare Other | Admitting: Internal Medicine

## 2021-01-02 ENCOUNTER — Encounter: Payer: Self-pay | Admitting: Internal Medicine

## 2021-01-02 VITALS — BP 127/76 | HR 105 | Temp 97.1°F | Ht 62.0 in | Wt 110.8 lb

## 2021-01-02 DIAGNOSIS — B370 Candidal stomatitis: Secondary | ICD-10-CM

## 2021-01-02 DIAGNOSIS — K219 Gastro-esophageal reflux disease without esophagitis: Secondary | ICD-10-CM | POA: Diagnosis not present

## 2021-01-02 DIAGNOSIS — R112 Nausea with vomiting, unspecified: Secondary | ICD-10-CM | POA: Diagnosis not present

## 2021-01-02 DIAGNOSIS — R070 Pain in throat: Secondary | ICD-10-CM

## 2021-01-02 MED ORDER — NYSTATIN 100000 UNIT/ML MT SUSP: 15.0000 mL | Freq: Three times a day (TID) | OROMUCOSAL | 1 refills | Status: AC

## 2021-01-02 NOTE — Patient Instructions (Signed)
For your throat burning, vomiting, swallowing issues, I am going to send you in a medication that I want you to take 3 times a day.  It is a liquid that I want you to swish in your mouth for a few seconds and then swallow.  Let us know in 2 weeks if you are improved or not.  If not, we need may need to consider performing upper endoscopy to further evaluate.  It was nice seeing you again today.  I hope you have a Merry Christmas and happy new year.  Dr. Abbey Chatters  At [facility] we value your feedback. You may receive a survey about your visit today. Please share your experience as we strive to create trusting relationships with our patients to provide genuine, compassionate, quality care.  We appreciate your understanding and patience as we review any laboratory studies, imaging, and other diagnostic tests that are ordered as we care for you. Our office policy is 5 business days for review of these results, and any emergent or urgent results are addressed in a timely manner for your best interest. If you do not hear from our office in 1 week, please contact us.   We also encourage the use of MyChart, which contains your medical information for your review as well. If you are not enrolled in this feature, an access code is on this after visit summary for your convenience. Thank you for allowing [place] to be involved in your care.  It was great to see you today!  I hope you have a great rest of your Winter!    Elon Alas. Abbey Chatters, D.O. Gastroenterology and Hepatology [facility]

## 2021-01-02 NOTE — Progress Notes (Signed)
Referring Provider: Redmond School, MD Primary Care Physician:  Redmond School, MD Primary GI:  Dr. Abbey Chatters  Chief Complaint  Patient presents with   burning in throat    Throat burns in afternoon then has vomiting almost daily    HPI:   Margaret Thompson is a 87 y.o. female who presents to clinic today for follow-up visit.  History of elevated alk phos in June 2021 with evaluation including hepatitis A, B, and C all negative, RUQ ultrasound with fatty liver, cholelithiasis without cholecystitis, CBD normal.  LFTs back to normal in August 2021 and have remained within normal limits  History of chronic GERD, previously on pantoprazole though states this made her nauseous so she stopped taking it.  Also history of oral candidiasis which was treated with nystatin mouthwash previously and subsequent improvement in symptoms.  EGD from 2019 with moderate Schatzki ring status post dilation, esophagitis, medium hiatal hernia with gastritis (no H. Pylori), and duodenitis. Last colonoscopy in 2010 with multiple colon polyps ranging from 4-8 mm (tubular adenomas and hyperplastic polyps), 2 cecal AVMs, large internal hemorrhoids, pancolonic diverticulosis.  Patient declined further colonoscopies.  Today, complaining of throat burning.  Primarily occurs in the afternoon, mild to moderate in severity.  Also has vomiting almost daily.  GERD not well controlled.  No dysphagia odynophagia.  Past Medical History:  Diagnosis Date   Acute MI ([place]) 2003   Adenocarcinoma of left breast ([place]) 11/23/2012   Stage II (T1 C. N1 M0) adenocarcinoma of the left breast with surgery on 03/08/2001. She had a 1.5 cm cancer with 1/6 positive sentinel nodes. Reexcision margins were clear. She did participate in NSABP B.-30 randomized to Adriamycin and Taxotere for 4 cycles followed by radiation therapy from 07/13/2001 to 08/27/2001. She then started Arimidex on 07/07/2001 took that until the end of June 2008. T   Anemia     Bilateral cataracts    Breast cancer (HCC)    GERD (gastroesophageal reflux disease)    History of gout    Hypercholesteremia    Hypertension    Malignant neoplasm of lower-inner quadrant of left female breast [facility])    Osteoporosis     Past Surgical History:  Procedure Laterality Date   ABDOMINAL HYSTERECTOMY     BREAST SURGERY     CATARACT EXTRACTION W/PHACO Left 09/04/2014   Procedure: CATARACT EXTRACTION PHACO AND INTRAOCULAR LENS PLACEMENT ([place]);  Surgeon: Tonny Branch, MD;  Location: [facility] ORS;  Service: Ophthalmology;  Laterality: Left;  CDE:9.84   CATARACT EXTRACTION W/PHACO Right 11/09/2014   Procedure: CATARACT EXTRACTION PHACO AND INTRAOCULAR LENS PLACEMENT ([facility]);  Surgeon: Tonny Branch, MD;  Location: [facility] ORS;  Service: Ophthalmology;  Laterality: Right;  CDE:8.40   CORONARY ANGIOPLASTY     RCA stent   ESOPHAGOGASTRODUODENOSCOPY (EGD) WITH PROPOFOL N/A 08/04/2017   Fields: esophagitis, gastritis, no h.pylori. medium sized hiatal hernia, moderate schatzki ring s/p dilation   LEFT HEART CATHETERIZATION WITH CORONARY ANGIOGRAM N/A 12/21/2012   Procedure: LEFT HEART CATHETERIZATION WITH CORONARY ANGIOGRAM;  Surgeon: Clent Demark, MD;  Location: [facility] CATH LAB;  Service: Cardiovascular;  Laterality: N/A;   MASTECTOMY MODIFIED RADICAL Left 01/16/2017   Procedure: SIMPLE MASTECTOMY;  Surgeon: Aviva Signs, MD;  Location: [facility];  Service: General;  Laterality: Left;   MASTECTOMY, PARTIAL Left    PERCUTANEOUS STENT INTERVENTION     RCA   PORT-A-CATH REMOVAL     PORT-A-CATH REMOVAL Right 09/13/2018   Procedure: REMOVAL PORT-A-CATH;  Surgeon: Aviva Signs, MD;  Location: [facility];  Service: General;  Laterality: Right;   PORTACATH PLACEMENT     PORTACATH PLACEMENT Right 03/11/2017   Procedure: INSERTION PORT-A-CATH;  Surgeon: Aviva Signs, MD;  Location: [facility];  Service: General;  Laterality: Right;   SAVORY DILATION N/A 08/04/2017   Procedure: SAVORY DILATION;  Surgeon: Danie Binder, MD;   Location: [facility] ENDO SUITE;  Service: Endoscopy;  Laterality: N/A;    Current Outpatient Medications  Medication Sig Dispense Refill   acetaminophen (TYLENOL) 325 MG tablet Take 650 mg by mouth every 6 (six) hours as needed.     aspirin EC 81 MG tablet Take 81 mg by mouth daily.     atorvastatin (LIPITOR) 40 MG tablet Take 40 mg by mouth daily.     diltiazem (CARDIZEM CD) 180 MG 24 hr capsule Take 180 mg by mouth daily.     magic mouthwash (nystatin, lidocaine, diphenhydrAMINE, alum & mag hydroxide) suspension Swish and swallow 15 mLs 3 (three) times daily for 14 days. Place half of the dose in one side of the mouth. Swish it around the mouth, gargle, and swallow. Keep the liquid in your mouth for as long as possible. Then repeat with the remaining half of the dose in the other side of the mouth. Avoid eating for 5-10 minutes after using this medication. 630 mL 1   pantoprazole (PROTONIX) 40 MG tablet Take 1 tablet (40 mg total) by mouth 2 (two) times daily before a meal. (Patient not taking: Reported on 01/02/2021) 60 tablet 5   No current facility-administered medications for this visit.    Allergies as of 01/02/2021 - Review Complete 01/02/2021  Allergen Reaction Noted   Azithromycin Anaphylaxis and Hives 09/15/2010   Ivp dye [iodinated diagnostic agents] Hives 11/22/2020   Cipro [ciprofloxacin hcl]  04/09/2020   Gatifloxacin  04/09/2020   Levaquin [levofloxacin]  04/09/2020    Family History  Problem Relation Age of Onset   Stroke Mother    Colon cancer Neg Hx    Gastric cancer Neg Hx    Esophageal cancer Neg Hx     Social History   Socioeconomic History   Marital status: Widowed    Spouse name: Not on file   Number of children: Not on file   Years of education: Not on file   Highest education level: Not on file  Occupational History   Not on file  Tobacco Use   Smoking status: Every Day    Packs/day: 0.50    Years: 50.00    Pack years: 25.00    Types: Cigarettes    Smokeless tobacco: Never   Tobacco comments:    10 cigarettes daily  Vaping Use   Vaping Use: Never used  Substance and Sexual Activity   Alcohol use: No   Drug use: No   Sexual activity: Not Currently    Birth control/protection: Surgical  Other Topics Concern   Not on file  Social History Narrative   Not on file   Social Determinants of Health   Financial Resource Strain: Not on file  Food Insecurity: Not on file  Transportation Needs: Not on file  Physical Activity: Not on file  Stress: Not on file  Social Connections: Not on file    Subjective: Review of Systems  Constitutional:  Negative for chills and fever.  HENT:  Negative for congestion and hearing loss.   Eyes:  Negative for blurred vision and double vision.  Respiratory:  Negative for cough and shortness of breath.  Cardiovascular:  Negative for chest pain and palpitations.  Gastrointestinal:  Positive for heartburn and nausea. Negative for abdominal pain, blood in stool, constipation, diarrhea, melena and vomiting.  Genitourinary:  Negative for dysuria and urgency.  Musculoskeletal:  Negative for joint pain and myalgias.  Skin:  Negative for itching and rash.  Neurological:  Negative for dizziness and headaches.  Psychiatric/Behavioral:  Negative for depression. The patient is not nervous/anxious.     Objective: BP 127/76   Pulse (!) 105   Temp (!) 97.1 F (36.2 C) (Temporal)   Ht 5' 2"  (1.575 m)   Wt 110 lb 12.8 oz (50.3 kg)   BMI 20.27 kg/m  Physical Exam Constitutional:      Appearance: Normal appearance.  HENT:     Head: Normocephalic and atraumatic.     Mouth/Throat:     Comments: Mild amount of thrush noted in the posterior oropharynx Eyes:     Extraocular Movements: Extraocular movements intact.     Conjunctiva/sclera: Conjunctivae normal.  Cardiovascular:     Rate and Rhythm: Normal rate and regular rhythm.  Pulmonary:     Effort: Pulmonary effort is normal.     Breath sounds: Normal  breath sounds.  Abdominal:     General: Bowel sounds are normal.     Palpations: Abdomen is soft.  Musculoskeletal:        General: No swelling. Normal range of motion.     Cervical back: Normal range of motion and neck supple.  Skin:    General: Skin is warm and dry.     Coloration: Skin is not jaundiced.  Neurological:     General: No focal deficit present.     Mental Status: She is alert and oriented to person, place, and time.  Psychiatric:        Mood and Affect: Mood normal.        Behavior: Behavior normal.     Assessment: *GERD-uncontrolled *Oral thrush *Throat burning *Nausea  Plan: Patient's chronic GERD does not appear to be well controlled on pantoprazole.  I will trial her on Dexilant 60 mg daily.  Samples provided today.  If this is helping she will call office for formal prescription.  For her new onset throat burning, likely this is oral thrush.  We will  print prescription for for Magic mouthwash.  Patient is told to use this 3 times daily.  Follow-up in 6 weeks or sooner if needed.  01/02/2021 3:32 PM   Disclaimer: This note was dictated with voice recognition software. Similar sounding words can inadvertently be transcribed and may not be corrected upon review.

## 2021-01-03 ENCOUNTER — Encounter (HOSPITAL_COMMUNITY): Payer: Self-pay | Admitting: Hematology

## 2021-01-03 ENCOUNTER — Inpatient Hospital Stay (HOSPITAL_BASED_OUTPATIENT_CLINIC_OR_DEPARTMENT_OTHER): Payer: Medicare Other | Admitting: Hematology

## 2021-01-03 ENCOUNTER — Other Ambulatory Visit: Payer: Self-pay

## 2021-01-03 ENCOUNTER — Ambulatory Visit (HOSPITAL_COMMUNITY): Payer: Medicare Other | Admitting: Hematology

## 2021-01-03 DIAGNOSIS — Z171 Estrogen receptor negative status [ER-]: Secondary | ICD-10-CM | POA: Diagnosis not present

## 2021-01-03 DIAGNOSIS — C50912 Malignant neoplasm of unspecified site of left female breast: Secondary | ICD-10-CM | POA: Diagnosis not present

## 2021-01-03 NOTE — Progress Notes (Signed)
Virtual Visit via Telephone Note  I connected with Margaret Thompson on 01/03/21 at  4:00 PM EST by telephone and verified that I am speaking with the correct person using two identifiers.  Location: Patient: At home Provider: In the office   I discussed the limitations, risks, security and privacy concerns of performing an evaluation and management service by telephone and the availability of in person appointments. I also discussed with the patient that there may be a patient responsible charge related to this service. The patient expressed understanding and agreed to proceed.   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  She did not receive any adjuvant chemotherapy.  She had prior history of left breast node positive breast cancer in 2003.   Observations/Objective: She does not report any chest pains or severe fatigue.  Appetite and energy levels are 50%.  She is seeing Dr. Abbey Chatters for dysphagia.  Assessment and Plan:  1.  Left lung mass: - We have reviewed PET scan from 12/14/2020 which showed enlarging lingular mass measuring 3.7 x 2.6 cm, SUV 11.7.  Previously 2.0 x 1.4 cm with SUV 5.9. - There is a right lower paratracheal lymph node measuring 10 mm with SUV 3.4.  But this lymph node was seen in 2019 scans. - Left lingular mass has been progressively increasing in size. - She has met previously with Dr. Sondra Come. - I have recommended her to see him again for SBRT like treatment.  She is agreeable. - We will make a referral to Dr. Sondra Come. - She will follow-up with [place] with a CT scan 3 months after completion of radiation.   Follow Up Instructions: RTC 4 months with CT of the chest.   I discussed the assessment and treatment plan with the patient. The patient was provided an opportunity to ask questions and all were answered. The patient agreed with the plan and demonstrated an understanding of the instructions.    The patient was advised to call back or seek an in-person evaluation if the symptoms worsen or if the condition fails to improve as anticipated.  I provided 23 minutes of non-face-to-face time during this encounter.   Derek Jack, MD

## 2021-01-08 ENCOUNTER — Other Ambulatory Visit (HOSPITAL_COMMUNITY): Payer: Self-pay

## 2021-01-08 DIAGNOSIS — R918 Other nonspecific abnormal finding of lung field: Secondary | ICD-10-CM

## 2021-01-08 MED ORDER — PREDNISONE 50 MG PO TABS: ORAL_TABLET | ORAL | 0 refills | Status: DC

## 2021-01-08 NOTE — Progress Notes (Signed)
Derek Jack, MD  Candis Musa, LPN Please order CT chest with contrast in 4 months with routine labs.   Future order placed for 4 months from now per Dr. Tomie China order. Future pre-medication for contrast allergy prednisone 50mg  sent to pharmacy.

## 2021-01-15 ENCOUNTER — Ambulatory Visit (HOSPITAL_COMMUNITY): Payer: Medicare Other | Admitting: Hematology

## 2021-01-17 ENCOUNTER — Ambulatory Visit: Payer: Medicare Other | Admitting: Dietician

## 2021-01-17 NOTE — Progress Notes (Signed)
Nutrition Assessment   Reason for Assessment: MST screen for weight loss.   Called patient at her home telephone. She screen for weight loss.  She explained she has been having trouble recently with a burning sensation followed by vomiting when she swallows.  She said she is managing now and feeling a little better with softer foods.  She forgot to tell her doctor that she doesn't tolerate magic mouth wash. She also reports the last it was ordered her out of pock expense was $70 so she isn't currently using it.  Her current PO intake: Breakfast: peanut butter biscuit Lunch: burger Dinner :soup Fluids: coffee and water, denies alcohol Like yogurt and applesauce.  We reviewed soft moist foods. Offered to mail nutrition tips, she declined. Offered in-person f/u with RD in [place]. She declined. Provided contact information if she has future nutritional concerns.  Anthropometrics:  Lost 8# , 7% over 6 months.  Height: 62" Weight: 110# UBW: 120# BMI: 20.27   April Manson, RDN, LDN Registered Dietitian, [place] Part Time Remote (Usual office hours: Tuesday-Thursday) Cell: [phone]

## 2021-01-18 NOTE — Progress Notes (Signed)
Location of tumor and Histology per Pathology Report:  1.  Left lung mass: - We have reviewed PET scan from 12/14/2020 which showed enlarging lingular mass measuring 3.7 x 2.6 cm, SUV 11.7.  Previously 2.0 x 1.4 cm with SUV 5.9. - There is a right lower paratracheal lymph node measuring 10 mm with SUV 3.4.  But this lymph node was seen in 2019 scans. - Left lingular mass has been progressively increasing in size. - She has met previously with Dr. Sondra Come. - I have recommended her to see him again for SBRT like treatment.  She is agreeable. - We will make a referral to Dr. Sondra Come. - She will follow-up with [place] with a CT scan 3 months after completion of radiation.   Biopsy: recommended    Past/Anticipated interventions by surgeon, if any:  - I have recommended her to see him again for SBRT like treatment.  She is agreeable. - We will make a referral to Dr. Sondra Come. - She will follow-up with [place] with a CT scan 3 months after completion of radiation.    Past/Anticipated interventions by medical oncology, if any: none at this time       Pain issues, if any:  yes, right shoulder pain rated 5/10 intermittent and aching    SAFETY ISSUES: Prior radiation? yes, left breast early 2000s  Pacemaker/ICD? no  Possible current pregnancy? no Is the patient on methotrexate? no    Current Complaints / other details:  ear pain, fatigue.

## 2021-01-22 ENCOUNTER — Telehealth: Payer: Medicare Other | Admitting: Dietician

## 2021-01-24 ENCOUNTER — Encounter: Payer: Self-pay | Admitting: Radiation Oncology

## 2021-01-24 ENCOUNTER — Ambulatory Visit
Admission: RE | Admit: 2021-01-24 | Discharge: 2021-01-24 | Disposition: A | Discharge status: Home / Self Care | Payer: Medicare Other | Source: Ambulatory Visit | Attending: Radiation Oncology | Admitting: Radiation Oncology

## 2021-01-24 ENCOUNTER — Other Ambulatory Visit: Payer: Self-pay

## 2021-01-24 VITALS — BP 141/64 | HR 92 | Temp 96.2°F | Resp 18 | Ht 62.0 in | Wt 109.0 lb

## 2021-01-24 DIAGNOSIS — Z853 Personal history of malignant neoplasm of breast: Secondary | ICD-10-CM | POA: Insufficient documentation

## 2021-01-24 DIAGNOSIS — Z7982 Long term (current) use of aspirin: Secondary | ICD-10-CM | POA: Diagnosis not present

## 2021-01-24 DIAGNOSIS — Z79899 Other long term (current) drug therapy: Secondary | ICD-10-CM | POA: Diagnosis not present

## 2021-01-24 DIAGNOSIS — R918 Other nonspecific abnormal finding of lung field: Secondary | ICD-10-CM | POA: Diagnosis not present

## 2021-01-24 DIAGNOSIS — R110 Nausea: Secondary | ICD-10-CM | POA: Insufficient documentation

## 2021-01-24 DIAGNOSIS — R0602 Shortness of breath: Secondary | ICD-10-CM | POA: Diagnosis not present

## 2021-01-24 DIAGNOSIS — E7800 Pure hypercholesterolemia, unspecified: Secondary | ICD-10-CM | POA: Insufficient documentation

## 2021-01-24 DIAGNOSIS — R5383 Other fatigue: Secondary | ICD-10-CM | POA: Insufficient documentation

## 2021-01-24 DIAGNOSIS — F17210 Nicotine dependence, cigarettes, uncomplicated: Secondary | ICD-10-CM | POA: Diagnosis not present

## 2021-01-24 DIAGNOSIS — R911 Solitary pulmonary nodule: Secondary | ICD-10-CM | POA: Diagnosis not present

## 2021-01-24 DIAGNOSIS — I10 Essential (primary) hypertension: Secondary | ICD-10-CM | POA: Insufficient documentation

## 2021-01-24 NOTE — Progress Notes (Signed)
Radiation Oncology         [phone] ________________________________  Follow-up New Visit   Outpatient   Name: Margaret Thompson MRN: [id number]  Date: 01/24/2021  DOB: [birth year]  CC:Redmond School, MD  Derek Jack, MD   REFERRING PHYSICIAN: Derek Jack, MD  DIAGNOSIS: The encounter diagnosis was Mass of lingula of lung.  The primary encounter diagnosis was Nodule of left lung. Diagnoses of History of left breast cancer (2003) and Mass of lingula of lung were also pertinent to this visit.   Left lung pulmonary nodule    Narrative :Margaret Thompson is a 88 y.o. female who returns today for re-evaluation of her left lung nodule. She was last seen here for her initial consultation on 07/18/20. To review for our initial consultation, I explained that a tissue diagnosis would preferable given her prior history of left breast cancer. The patient also has limited mobility in her left shoulder due to breaking her left shoulder in the beginning of 2022. I informed her that that this would make SBRT a bit more complicated but still conceivable. The patient requested further time to consider pursuing biopsies before making a final decision.   Since her last visit, the patient followed up with Dr. Lorenso Courier on 08/21/20. During which time, the patient reported that met with Dr. Roxan Hockey who discussed the risks/benefits of biopsy, and she decided to not pursue biopsies. Dr. Lorenso Courier discussed with the patient that he could re-refer her to radiation if her arm mobility improves.   Chest CT on 11/21/2020 showed interval enlargement of irregular somewhat spiculated masslike opacity in the left lower lobe, measuring 4.4 x 2.9 cm (previously 3.5 x 1.8 cm). Prominent mediastinal lymph nodes were also seen to appear unchanged in the interval.  On 11/27/20, the patient met with Dr. Delton Coombes. During this visit, the patient denied any chest wall pain, though did report SOB, nausea, fatigue, and a  decrease in appetite and taste. Dr. Delton Coombes again recommended biopsies, and recommended additional PET imaging.  PET on 12/13/20 again demonstrated the enlarging hypermetabolic mass in the left lingular lobe as most consistent with bronchogenic carcinoma. A small indeterminate hypermetabolic right lower paratracheal lymph node was also appreciated, measuring 10 mm with SUV 3.4 (seen in 2019 scans). Otherwise, no evidence of distant metastatic disease was appreciated.   In most recent history, the patient met with Dr. Delton Coombes on 01/03/21 via telehealth visit to discuss recent imaging. Given the above findings of continued progression, the patient agreed to meet with me again to pursue SBRT treatment.  Other pertinent imaging performed in the interval includes right breast screening mammogram on 09/10/20 which showed no evidence of malignancy.    She now reports improvement in her left shoulder mobility.  She denies any significant cough hemoptysis or pain within the chest region.  Discussed potential biopsy and again she does not wish to consider this procedure.      (Also of note: the patient continues to smoke. Smokes around 1/2 pack per day)   PREVIOUS RADIATION THERAPY: Yes, from 07/13/2001 to 08/27/2001, breast conservation therapy.  The patient had recurrent left breast (mastectomy at a later date.  Radiation details are pending at this time.  PAST MEDICAL HISTORY:  Past Medical History:  Diagnosis Date   Acute MI ([place]) 2003   Adenocarcinoma of left breast ([facility]) 11/23/2012   Stage II (T1 C. N1 M0) adenocarcinoma of the left breast with surgery on 03/08/2001. She had a 1.5 cm cancer with 1/6 positive  sentinel nodes. Reexcision margins were clear. She did participate in NSABP B.-30 randomized to Adriamycin and Taxotere for 4 cycles followed by radiation therapy from 07/13/2001 to 08/27/2001. She then started Arimidex on 07/07/2001 took that until the end of June 2008. T   Anemia     Bilateral cataracts    Breast cancer (HCC)    GERD (gastroesophageal reflux disease)    History of gout    Hypercholesteremia    Hypertension    Malignant neoplasm of lower-inner quadrant of left female breast ([place])    Osteoporosis     PAST SURGICAL HISTORY: Past Surgical History:  Procedure Laterality Date   ABDOMINAL HYSTERECTOMY     BREAST SURGERY     CATARACT EXTRACTION W/PHACO Left 09/04/2014   Procedure: CATARACT EXTRACTION PHACO AND INTRAOCULAR LENS PLACEMENT ([place]);  Surgeon: Tonny Branch, MD;  Location: [facility];  Service: Ophthalmology;  Laterality: Left;  CDE:9.84   CATARACT EXTRACTION W/PHACO Right 11/09/2014   Procedure: CATARACT EXTRACTION PHACO AND INTRAOCULAR LENS PLACEMENT ([facility]);  Surgeon: Tonny Branch, MD;  Location: [facility];  Service: Ophthalmology;  Laterality: Right;  CDE:8.40   CORONARY ANGIOPLASTY     RCA stent   ESOPHAGOGASTRODUODENOSCOPY (EGD) WITH PROPOFOL N/A 08/04/2017   Fields: esophagitis, gastritis, no h.pylori. medium sized hiatal hernia, moderate schatzki ring s/p dilation   LEFT HEART CATHETERIZATION WITH CORONARY ANGIOGRAM N/A 12/21/2012   Procedure: LEFT HEART CATHETERIZATION WITH CORONARY ANGIOGRAM;  Surgeon: Clent Demark, MD;  Location: [facility];  Service: Cardiovascular;  Laterality: N/A;   MASTECTOMY MODIFIED RADICAL Left 01/16/2017   Procedure: SIMPLE MASTECTOMY;  Surgeon: Aviva Signs, MD;  Location: [facility];  Service: General;  Laterality: Left;   MASTECTOMY, PARTIAL Left    PERCUTANEOUS STENT INTERVENTION     RCA   PORT-A-CATH REMOVAL     PORT-A-CATH REMOVAL Right 09/13/2018   Procedure: REMOVAL PORT-A-CATH;  Surgeon: Aviva Signs, MD;  Location: [facility];  Service: General;  Laterality: Right;   PORTACATH PLACEMENT     PORTACATH PLACEMENT Right 03/11/2017   Procedure: INSERTION PORT-A-CATH;  Surgeon: Aviva Signs, MD;  Location: [facility];  Service: General;  Laterality: Right;   SAVORY DILATION N/A 08/04/2017   Procedure: SAVORY DILATION;   Surgeon: Danie Binder, MD;  Location: [facility];  Service: Endoscopy;  Laterality: N/A;    FAMILY HISTORY:  Family History  Problem Relation Age of Onset   Stroke Mother    Colon cancer Neg Hx    Gastric cancer Neg Hx    Esophageal cancer Neg Hx     SOCIAL HISTORY:  Social History   Tobacco Use   Smoking status: Every Day    Packs/day: 0.50    Years: 50.00    Pack years: 25.00    Types: Cigarettes   Smokeless tobacco: Never   Tobacco comments:    10 cigarettes daily  Vaping Use   Vaping Use: Never used  Substance Use Topics   Alcohol use: No   Drug use: No    ALLERGIES:  Allergies  Allergen Reactions   Azithromycin Anaphylaxis and Hives   Ivp Dye [Iodinated Contrast Media] Hives    CT    Cipro [Ciprofloxacin Hcl]    Gatifloxacin    Levaquin [Levofloxacin]     MEDICATIONS:  Current Outpatient Medications  Medication Sig Dispense Refill   acetaminophen (TYLENOL) 325 MG tablet Take 650 mg by mouth every 6 (six) hours as needed.     aspirin EC 81 MG tablet Take 81  mg by mouth daily.     atorvastatin (LIPITOR) 40 MG tablet Take 40 mg by mouth daily.     diltiazem (CARDIZEM CD) 180 MG 24 hr capsule Take 180 mg by mouth daily.     pantoprazole (PROTONIX) 40 MG tablet Take 1 tablet (40 mg total) by mouth 2 (two) times daily before a meal. (Patient not taking: Reported on 01/24/2021) 60 tablet 5   [START ON 05/09/2021] predniSONE (DELTASONE) 50 MG tablet Take one tablet 13 hours before scan, 7 hours before scan, and 1 hour prior to scan. (Patient not taking: Reported on 01/24/2021) 3 tablet 0   No current facility-administered medications for this encounter.    REVIEW OF SYSTEMS:  A 10+ POINT REVIEW OF SYSTEMS WAS OBTAINED including neurology, dermatology, psychiatry, cardiac, respiratory, lymph, extremities, GI, GU, musculoskeletal, constitutional, reproductive, HEENT.  She denies any pain within the left lateral chest area significant cough or hemoptysis    PHYSICAL EXAM:  height is _0  (1.575 m) and weight is 109 lb (49.4 kg). Her temporal temperature is 96.2 F (35.7 C) (abnormal). Her blood pressure is 141/64 (abnormal) and her pulse is 92. Her respiration is 18 and oxygen saturation is 97%.   General: Alert and oriented, in no acute distress HEENT: Head is normocephalic. Extraocular movements are intact.  Neck: Neck is supple, no palpable cervical or supraclavicular lymphadenopathy. Heart: Regular in rate and rhythm with no murmurs, rubs, or gallops. Chest: Clear to auscultation bilaterally, with no rhonchi, wheezes, or rales. Abdomen: Soft, nontender, nondistended, with no rigidity or guarding. Extremities: No cyanosis or edema. Lymphatics: see Neck Exam Skin: No concerning lesions. Musculoskeletal: symmetric strength and muscle tone throughout.  Left shoulder mobility improved since last time I examined the patient. Neurologic: Cranial nerves II through XII are grossly intact. No obvious focalities. Speech is fluent. Coordination is intact. Psychiatric: Judgment and insight are intact. Affect is appropriate.   ECOG = 1  0 - Asymptomatic (Fully active, able to carry on all predisease activities without restriction)  1 - Symptomatic but completely ambulatory (Restricted in physically strenuous activity but ambulatory and able to carry out work of a light or sedentary nature. For example, light housework, office work)  2 - Symptomatic, <50% in bed during the day (Ambulatory and capable of all self care but unable to carry out any work activities. Up and about more than 50% of waking hours)  3 - Symptomatic, >50% in bed, but not bedbound (Capable of only limited self-care, confined to bed or chair 50% or more of waking hours)  4 - Bedbound (Completely disabled. Cannot carry on any self-care. Totally confined to bed or chair)  5 - Death   Eustace Pen MM, Creech RH, Tormey DC, et al. [phone]). "Toxicity and response criteria of the [facility]". [place] Oncol. 5 (6): 649-55  LABORATORY DATA:  Lab Results  Component Value Date   WBC 8.5 11/21/2020   HGB 14.5 11/21/2020   HCT 44.2 11/21/2020   MCV 95.3 11/21/2020   PLT 289 11/21/2020   NEUTROABS 5.3 11/21/2020   Lab Results  Component Value Date   NA 135 11/21/2020   K 3.9 11/21/2020   CL 98 11/21/2020   CO2 26 11/21/2020   GLUCOSE 103 (H) 11/21/2020   CREATININE 0.90 11/21/2020   CALCIUM 9.4 11/21/2020      RADIOGRAPHY: No results found.    IMPRESSION: The primary encounter diagnosis was Nodule of left lung. Diagnoses of History of left  breast cancer (2003) and Mass of lingula of lung were also pertinent to this visit.   Left lung pulmonary nodule    History of left breast invasive ductal carcinoma   We discussed that the patient's tumor nodule has progressed on recent imaging.  Given her improvement in shoulder mobility she is now wanting to proceed with stereotactic body radiation therapy of her presumed malignancy.  Today, I talked to the patient  about the findings and work-up thus far.  We discussed the natural history of presumed non-small cell lung cancer and general treatment, highlighting the role of radiotherapy in the management.  We discussed the available radiation techniques, and focused on the details of logistics and delivery.  We reviewed the anticipated acute and late sequelae associated with radiation in this setting.  She understands that there would be a potential for rib fracture in the setting related to her SBRT.  She understands this risk.  The patient was encouraged to ask questions that I answered to the best of my ability.  A patient consent form was discussed and signed.  We retained a copy for our records.  The patient would like to proceed with radiation and will be scheduled for CT simulation.  PLAN: She will return on Friday, January 28 for SBRT simulation.  Anticipate treatments beginning approximately 7  to 10 days later.  Anticipate between 3 and 5 SBRT treatments.   35 minutes of total time was spent for this patient encounter, including preparation, face-to-face counseling with the patient and coordination of care, physical exam, and documentation of the encounter.   ------------------------------------------------  Blair Promise, PhD, MD  This document serves as a record of services personally performed by Gery Pray, MD. It was created on his behalf by Roney Mans, a trained medical scribe. The creation of this record is based on the scribe's personal observations and the provider's statements to them. This document has been checked and approved by the attending provider.

## 2021-01-26 DIAGNOSIS — M25511 Pain in right shoulder: Secondary | ICD-10-CM | POA: Insufficient documentation

## 2021-01-31 DIAGNOSIS — H61001 Unspecified perichondritis of right external ear: Secondary | ICD-10-CM | POA: Diagnosis not present

## 2021-01-31 DIAGNOSIS — X32XXXA Exposure to sunlight, initial encounter: Secondary | ICD-10-CM | POA: Diagnosis not present

## 2021-01-31 DIAGNOSIS — L570 Actinic keratosis: Secondary | ICD-10-CM | POA: Diagnosis not present

## 2021-02-01 ENCOUNTER — Ambulatory Visit: Payer: Medicare Other | Admitting: Radiation Oncology

## 2021-02-08 DIAGNOSIS — M79671 Pain in right foot: Secondary | ICD-10-CM | POA: Diagnosis not present

## 2021-02-14 DIAGNOSIS — F17290 Nicotine dependence, other tobacco product, uncomplicated: Secondary | ICD-10-CM | POA: Diagnosis not present

## 2021-02-14 DIAGNOSIS — I2510 Atherosclerotic heart disease of native coronary artery without angina pectoris: Secondary | ICD-10-CM | POA: Diagnosis not present

## 2021-02-14 DIAGNOSIS — I10 Essential (primary) hypertension: Secondary | ICD-10-CM | POA: Diagnosis not present

## 2021-02-14 DIAGNOSIS — J449 Chronic obstructive pulmonary disease, unspecified: Secondary | ICD-10-CM | POA: Diagnosis not present

## 2021-02-14 DIAGNOSIS — E785 Hyperlipidemia, unspecified: Secondary | ICD-10-CM | POA: Diagnosis not present

## 2021-02-22 DIAGNOSIS — M25511 Pain in right shoulder: Secondary | ICD-10-CM | POA: Diagnosis not present

## 2021-02-22 DIAGNOSIS — M79671 Pain in right foot: Secondary | ICD-10-CM | POA: Diagnosis not present

## 2021-02-25 ENCOUNTER — Ambulatory Visit: Payer: Medicare Other | Admitting: Radiation Oncology

## 2021-03-01 DIAGNOSIS — E538 Deficiency of other specified B group vitamins: Secondary | ICD-10-CM | POA: Diagnosis not present

## 2021-03-13 DIAGNOSIS — M25511 Pain in right shoulder: Secondary | ICD-10-CM | POA: Diagnosis not present

## 2021-03-13 DIAGNOSIS — M25512 Pain in left shoulder: Secondary | ICD-10-CM | POA: Diagnosis not present

## 2021-03-28 DIAGNOSIS — M25511 Pain in right shoulder: Secondary | ICD-10-CM | POA: Diagnosis not present

## 2021-03-28 DIAGNOSIS — M25512 Pain in left shoulder: Secondary | ICD-10-CM | POA: Diagnosis not present

## 2021-04-05 DIAGNOSIS — M79671 Pain in right foot: Secondary | ICD-10-CM | POA: Diagnosis not present

## 2021-04-05 DIAGNOSIS — M25512 Pain in left shoulder: Secondary | ICD-10-CM | POA: Diagnosis not present

## 2021-04-05 DIAGNOSIS — M25511 Pain in right shoulder: Secondary | ICD-10-CM | POA: Diagnosis not present

## 2021-04-09 ENCOUNTER — Other Ambulatory Visit: Payer: Self-pay

## 2021-04-09 ENCOUNTER — Ambulatory Visit
Admission: RE | Admit: 2021-04-09 | Discharge: 2021-04-09 | Disposition: A | Discharge status: Home / Self Care | Payer: Medicare Other | Source: Ambulatory Visit | Attending: Radiation Oncology | Admitting: Radiation Oncology

## 2021-04-09 DIAGNOSIS — R918 Other nonspecific abnormal finding of lung field: Secondary | ICD-10-CM

## 2021-04-09 DIAGNOSIS — Z510 Encounter for antineoplastic radiation therapy: Secondary | ICD-10-CM | POA: Diagnosis not present

## 2021-04-09 DIAGNOSIS — C3432 Malignant neoplasm of lower lobe, left bronchus or lung: Secondary | ICD-10-CM | POA: Insufficient documentation

## 2021-04-09 DIAGNOSIS — R911 Solitary pulmonary nodule: Secondary | ICD-10-CM | POA: Diagnosis not present

## 2021-04-11 ENCOUNTER — Ambulatory Visit: Payer: Medicare Other | Admitting: Radiation Oncology

## 2021-04-11 ENCOUNTER — Other Ambulatory Visit (HOSPITAL_COMMUNITY): Payer: Self-pay | Admitting: Physician Assistant

## 2021-04-11 ENCOUNTER — Ambulatory Visit (HOSPITAL_COMMUNITY)
Admission: RE | Admit: 2021-04-11 | Discharge: 2021-04-11 | Disposition: A | Discharge status: Home / Self Care | Payer: Medicare Other | Source: Ambulatory Visit | Attending: Physician Assistant | Admitting: Physician Assistant

## 2021-04-11 DIAGNOSIS — G44309 Post-traumatic headache, unspecified, not intractable: Secondary | ICD-10-CM

## 2021-04-11 DIAGNOSIS — Z681 Body mass index (BMI) 19 or less, adult: Secondary | ICD-10-CM | POA: Diagnosis not present

## 2021-04-11 DIAGNOSIS — M542 Cervicalgia: Secondary | ICD-10-CM | POA: Insufficient documentation

## 2021-04-11 DIAGNOSIS — H6121 Impacted cerumen, right ear: Secondary | ICD-10-CM | POA: Diagnosis not present

## 2021-04-11 DIAGNOSIS — M47812 Spondylosis without myelopathy or radiculopathy, cervical region: Secondary | ICD-10-CM | POA: Diagnosis not present

## 2021-04-11 DIAGNOSIS — D0502 Lobular carcinoma in situ of left breast: Secondary | ICD-10-CM | POA: Diagnosis not present

## 2021-04-11 DIAGNOSIS — M2578 Osteophyte, vertebrae: Secondary | ICD-10-CM | POA: Diagnosis not present

## 2021-04-11 DIAGNOSIS — E538 Deficiency of other specified B group vitamins: Secondary | ICD-10-CM | POA: Diagnosis not present

## 2021-04-18 ENCOUNTER — Ambulatory Visit
Admission: RE | Admit: 2021-04-18 | Discharge: 2021-04-18 | Disposition: A | Discharge status: Home / Self Care | Payer: Medicare Other | Source: Ambulatory Visit | Attending: Radiation Oncology | Admitting: Radiation Oncology

## 2021-04-18 ENCOUNTER — Other Ambulatory Visit: Payer: Self-pay

## 2021-04-18 DIAGNOSIS — Z510 Encounter for antineoplastic radiation therapy: Secondary | ICD-10-CM | POA: Diagnosis not present

## 2021-04-18 DIAGNOSIS — C3412 Malignant neoplasm of upper lobe, left bronchus or lung: Secondary | ICD-10-CM | POA: Diagnosis not present

## 2021-04-18 DIAGNOSIS — R911 Solitary pulmonary nodule: Secondary | ICD-10-CM | POA: Insufficient documentation

## 2021-04-18 DIAGNOSIS — Z853 Personal history of malignant neoplasm of breast: Secondary | ICD-10-CM | POA: Insufficient documentation

## 2021-04-18 DIAGNOSIS — R918 Other nonspecific abnormal finding of lung field: Secondary | ICD-10-CM

## 2021-04-22 ENCOUNTER — Ambulatory Visit: Payer: Medicare Other | Admitting: Radiation Oncology

## 2021-04-23 ENCOUNTER — Ambulatory Visit: Payer: Medicare Other | Admitting: Radiation Oncology

## 2021-04-23 ENCOUNTER — Ambulatory Visit
Admission: RE | Admit: 2021-04-23 | Discharge: 2021-04-23 | Disposition: A | Discharge status: Home / Self Care | Payer: Medicare Other | Source: Ambulatory Visit | Attending: Radiation Oncology | Admitting: Radiation Oncology

## 2021-04-23 ENCOUNTER — Other Ambulatory Visit: Payer: Self-pay

## 2021-04-23 DIAGNOSIS — R918 Other nonspecific abnormal finding of lung field: Secondary | ICD-10-CM

## 2021-04-23 DIAGNOSIS — Z853 Personal history of malignant neoplasm of breast: Secondary | ICD-10-CM | POA: Diagnosis not present

## 2021-04-23 DIAGNOSIS — R911 Solitary pulmonary nodule: Secondary | ICD-10-CM | POA: Diagnosis not present

## 2021-04-23 DIAGNOSIS — C3412 Malignant neoplasm of upper lobe, left bronchus or lung: Secondary | ICD-10-CM | POA: Diagnosis not present

## 2021-04-23 DIAGNOSIS — Z510 Encounter for antineoplastic radiation therapy: Secondary | ICD-10-CM | POA: Diagnosis not present

## 2021-04-24 ENCOUNTER — Ambulatory Visit: Payer: Medicare Other | Admitting: Radiation Oncology

## 2021-04-25 ENCOUNTER — Ambulatory Visit
Admission: RE | Admit: 2021-04-25 | Discharge: 2021-04-25 | Disposition: A | Discharge status: Home / Self Care | Payer: Medicare Other | Source: Ambulatory Visit | Attending: Radiation Oncology | Admitting: Radiation Oncology

## 2021-04-25 ENCOUNTER — Ambulatory Visit: Payer: Medicare Other | Admitting: Radiation Oncology

## 2021-04-25 ENCOUNTER — Other Ambulatory Visit: Payer: Self-pay

## 2021-04-25 ENCOUNTER — Encounter: Payer: Self-pay | Admitting: Internal Medicine

## 2021-04-25 DIAGNOSIS — Z510 Encounter for antineoplastic radiation therapy: Secondary | ICD-10-CM | POA: Diagnosis not present

## 2021-04-25 DIAGNOSIS — C3412 Malignant neoplasm of upper lobe, left bronchus or lung: Secondary | ICD-10-CM | POA: Diagnosis not present

## 2021-04-25 DIAGNOSIS — Z853 Personal history of malignant neoplasm of breast: Secondary | ICD-10-CM | POA: Diagnosis not present

## 2021-04-25 DIAGNOSIS — R918 Other nonspecific abnormal finding of lung field: Secondary | ICD-10-CM

## 2021-04-25 DIAGNOSIS — R911 Solitary pulmonary nodule: Secondary | ICD-10-CM | POA: Diagnosis not present

## 2021-04-26 ENCOUNTER — Ambulatory Visit: Payer: Medicare Other | Admitting: Radiation Oncology

## 2021-04-29 ENCOUNTER — Ambulatory Visit: Payer: Medicare Other | Admitting: Radiation Oncology

## 2021-04-29 ENCOUNTER — Ambulatory Visit
Admission: RE | Admit: 2021-04-29 | Discharge: 2021-04-29 | Disposition: A | Discharge status: Home / Self Care | Payer: Medicare Other | Source: Ambulatory Visit | Attending: Radiation Oncology | Admitting: Radiation Oncology

## 2021-04-29 ENCOUNTER — Other Ambulatory Visit: Payer: Self-pay

## 2021-04-29 DIAGNOSIS — Z853 Personal history of malignant neoplasm of breast: Secondary | ICD-10-CM | POA: Diagnosis not present

## 2021-04-29 DIAGNOSIS — Z510 Encounter for antineoplastic radiation therapy: Secondary | ICD-10-CM | POA: Diagnosis not present

## 2021-04-29 DIAGNOSIS — R918 Other nonspecific abnormal finding of lung field: Secondary | ICD-10-CM

## 2021-04-29 DIAGNOSIS — R911 Solitary pulmonary nodule: Secondary | ICD-10-CM | POA: Diagnosis not present

## 2021-04-29 DIAGNOSIS — C3412 Malignant neoplasm of upper lobe, left bronchus or lung: Secondary | ICD-10-CM | POA: Diagnosis not present

## 2021-04-30 ENCOUNTER — Ambulatory Visit: Payer: Medicare Other | Admitting: Radiation Oncology

## 2021-05-01 ENCOUNTER — Encounter (HOSPITAL_COMMUNITY): Payer: Self-pay

## 2021-05-01 ENCOUNTER — Other Ambulatory Visit: Payer: Self-pay

## 2021-05-01 ENCOUNTER — Ambulatory Visit (HOSPITAL_COMMUNITY)
Admission: RE | Admit: 2021-05-01 | Discharge: 2021-05-01 | Disposition: A | Discharge status: Home / Self Care | Payer: Medicare Other | Source: Ambulatory Visit | Attending: Hematology | Admitting: Hematology

## 2021-05-01 ENCOUNTER — Inpatient Hospital Stay (HOSPITAL_COMMUNITY): Payer: Medicare Other

## 2021-05-01 ENCOUNTER — Telehealth (HOSPITAL_COMMUNITY): Payer: Self-pay | Admitting: *Deleted

## 2021-05-01 ENCOUNTER — Encounter: Payer: Self-pay | Admitting: Radiation Oncology

## 2021-05-01 ENCOUNTER — Ambulatory Visit
Admission: RE | Admit: 2021-05-01 | Discharge: 2021-05-01 | Disposition: A | Discharge status: Home / Self Care | Payer: Medicare Other | Source: Ambulatory Visit | Attending: Radiation Oncology | Admitting: Radiation Oncology

## 2021-05-01 DIAGNOSIS — Z853 Personal history of malignant neoplasm of breast: Secondary | ICD-10-CM | POA: Insufficient documentation

## 2021-05-01 DIAGNOSIS — Z510 Encounter for antineoplastic radiation therapy: Secondary | ICD-10-CM | POA: Diagnosis not present

## 2021-05-01 DIAGNOSIS — R918 Other nonspecific abnormal finding of lung field: Secondary | ICD-10-CM

## 2021-05-01 DIAGNOSIS — R911 Solitary pulmonary nodule: Secondary | ICD-10-CM | POA: Insufficient documentation

## 2021-05-01 DIAGNOSIS — C3412 Malignant neoplasm of upper lobe, left bronchus or lung: Secondary | ICD-10-CM | POA: Diagnosis not present

## 2021-05-01 LAB — COMPREHENSIVE METABOLIC PANEL
ALT: 15 U/L (ref 0–44)
AST: 19 U/L (ref 15–41)
Albumin: 3.6 g/dL (ref 3.5–5.0)
Alkaline Phosphatase: 89 U/L (ref 38–126)
Anion gap: 8 (ref 5–15)
BUN: 13 mg/dL (ref 8–23)
CO2: 25 mmol/L (ref 22–32)
Calcium: 9.6 mg/dL (ref 8.9–10.3)
Chloride: 103 mmol/L (ref 98–111)
Creatinine, Ser: 0.74 mg/dL (ref 0.44–1.00)
GFR, Estimated: >60 mL/min (ref >60)
Glucose, Bld: 109 mg/dL — ABNORMAL HIGH (ref 70–99)
Potassium: 4.3 mmol/L (ref 3.5–5.1)
Sodium: 136 mmol/L (ref 135–145)
Total Bilirubin: 0.3 mg/dL (ref 0.3–1.2)
Total Protein: 8.1 g/dL (ref 6.5–8.1)

## 2021-05-01 LAB — CBC WITH DIFFERENTIAL/PLATELET
Abs Immature Granulocytes: 0.02 10*3/uL (ref 0.00–0.07)
Basophils Absolute: 0 10*3/uL (ref 0.0–0.1)
Basophils Relative: 1 %
Eosinophils Absolute: 0.1 10*3/uL (ref 0.0–0.5)
Eosinophils Relative: 1 %
HCT: 38.2 % (ref 36.0–46.0)
Hemoglobin: 12.2 g/dL (ref 12.0–15.0)
Immature Granulocytes: 0 %
Lymphocytes Relative: 23 %
Lymphs Abs: 1.8 10*3/uL (ref 0.7–4.0)
MCH: 29.1 pg (ref 26.0–34.0)
MCHC: 31.9 g/dL (ref 30.0–36.0)
MCV: 91.2 fL (ref 80.0–100.0)
Monocytes Absolute: 0.6 10*3/uL (ref 0.1–1.0)
Monocytes Relative: 7 %
Neutro Abs: 5.6 10*3/uL (ref 1.7–7.7)
Neutrophils Relative %: 68 %
Platelets: 390 10*3/uL (ref 150–400)
RBC: 4.19 MIL/uL (ref 3.87–5.11)
RDW: 13.3 % (ref 11.5–15.5)
WBC: 8.2 10*3/uL (ref 4.0–10.5)
nRBC: 0 % (ref 0.0–0.2)

## 2021-05-01 LAB — RAD ONC ARIA SESSION SUMMARY
Course Elapsed Days: 13
Plan Fractions Treated to Date: 5
Plan Prescribed Dose Per Fraction: 12 Gy
Plan Total Fractions Prescribed: 5
Plan Total Prescribed Dose: 60 Gy
Reference Point Dosage Given to Date: 60 Gy
Reference Point Session Dosage Given: 12 Gy
Session Number: 5

## 2021-05-01 NOTE — Telephone Encounter (Signed)
Patient's last radiation treatment today.  CT Chest was to be scheduled 3 months after last treatment, therefore Ct cancelled for today and rescheduled as recommended by Dr. Delton Coombes.  Patient aware and verbalized understanding. ? ?

## 2021-05-02 ENCOUNTER — Ambulatory Visit: Payer: Medicare Other | Admitting: Radiation Oncology

## 2021-05-08 ENCOUNTER — Ambulatory Visit (HOSPITAL_COMMUNITY): Payer: Medicare Other | Admitting: Hematology

## 2021-05-09 DIAGNOSIS — M79672 Pain in left foot: Secondary | ICD-10-CM | POA: Diagnosis not present

## 2021-05-09 DIAGNOSIS — M79674 Pain in right toe(s): Secondary | ICD-10-CM | POA: Diagnosis not present

## 2021-05-09 DIAGNOSIS — M79671 Pain in right foot: Secondary | ICD-10-CM | POA: Diagnosis not present

## 2021-05-09 DIAGNOSIS — M79675 Pain in left toe(s): Secondary | ICD-10-CM | POA: Diagnosis not present

## 2021-05-09 DIAGNOSIS — L609 Nail disorder, unspecified: Secondary | ICD-10-CM | POA: Diagnosis not present

## 2021-05-09 DIAGNOSIS — I739 Peripheral vascular disease, unspecified: Secondary | ICD-10-CM | POA: Diagnosis not present

## 2021-05-28 ENCOUNTER — Encounter: Payer: Self-pay | Admitting: Radiation Oncology

## 2021-06-03 ENCOUNTER — Ambulatory Visit: Payer: Self-pay | Admitting: Radiation Oncology

## 2021-06-03 NOTE — Progress Notes (Incomplete)
  Radiation Oncology         [phone] ________________________________  Patient Name: Margaret Thompson MRN: [id number] DOB: 1933/10/11 Referring Physician: Derek Jack Date of Service: 05/01/2021 [facility], [place]                                                        End Of Treatment Note  Diagnoses: R91.1-Solitary pulmonary nodule  Cancer Staging: Left lung pulmonary nodule   Intent: Curative  Radiation Treatment Dates: 04/18/2021 through 05/01/2021 Site Technique Total Dose (Gy) Dose per Fx (Gy) Completed Fx Beam Energies  Lung, Left: Lung_L IMRT 60/60 12 5/5 6XFFF   Narrative: The patient tolerated radiation therapy relatively well. On the date of her final treatment, the patient reported mild fatigue, SOB with activity, and productive cough with white phlegm. Overall, mild fatigue was her only symptom from radiation treatment.   Plan: The patient will follow-up with radiation oncology in one month .  ________________________________________________ -----------------------------------  Blair Promise, PhD, MD  This document serves as a record of services personally performed by Gery Pray, MD. It was created on his behalf by Roney Mans, a trained medical scribe. The creation of this record is based on the scribe's personal observations and the provider's statements to them. This document has been checked and approved by the attending provider.

## 2021-06-03 NOTE — Progress Notes (Signed)
Radiation Oncology         (336) [phone] ________________________________  Name: Margaret Thompson MRN: [id number]  Date: 06/04/2021  DOB: 06/02/1933  Follow-Up Visit Note  CC: Redmond School, MD  Derek Jack, MD  No diagnosis found.  Diagnosis:  The encounter diagnosis was Mass of lingula of lung.   The primary encounter diagnosis was Nodule of left lung. Diagnoses of History of left breast cancer (2003) and Mass of lingula of lung were also pertinent to this visit.   Left lung pulmonary nodule   Interval Since Last Radiation: 1 month and 4 days   Intent: Curative  Radiation Treatment Dates: 04/18/2021 through 05/01/2021 Site Technique Total Dose (Gy) Dose per Fx (Gy) Completed Fx Beam Energies  Lung, Left: Lung_L IMRT 60/60 12 5/5 6XFFF    (Radiation to the left breast in 2003, stage IIA left breast cancer - Treatment Dates: 07/13/2001 through 08/27/2001)  Narrative:  The patient returns today for routine 1 month follow-up. The patient tolerated radiation therapy relatively well. On the date of her final treatment, the patient reported mild fatigue, SOB with activity, and productive cough with white phlegm. Overall, mild fatigue was her only symptom from radiation treatment.      Since she was seen for re-evaluation in January, the patient had an x-ray of the cervical spine performed on 04/11/21 for evaluation of her back pain which showed moderate severe degenerative changes of the cervical spine. No acute abnormalities were appreciated.        Otherwise, no significant interval history since the patient completed radiation.   **            Allergies:  is allergic to azithromycin, ivp dye [iodinated contrast media], cipro [ciprofloxacin hcl], gatifloxacin, and levaquin [levofloxacin].  Meds: Current Outpatient Medications  Medication Sig Dispense Refill   acetaminophen (TYLENOL) 325 MG tablet Take 650 mg by mouth every 6 (six) hours as needed.     aspirin EC 81 MG  tablet Take 81 mg by mouth daily.     atorvastatin (LIPITOR) 40 MG tablet Take 40 mg by mouth daily.     diltiazem (CARDIZEM CD) 180 MG 24 hr capsule Take 180 mg by mouth daily.     pantoprazole (PROTONIX) 40 MG tablet Take 1 tablet (40 mg total) by mouth 2 (two) times daily before a meal. (Patient not taking: Reported on 01/24/2021) 60 tablet 5   predniSONE (DELTASONE) 50 MG tablet Take one tablet 13 hours before scan, 7 hours before scan, and 1 hour prior to scan. (Patient not taking: Reported on 01/24/2021) 3 tablet 0   No current facility-administered medications for this encounter.    Physical Findings: The patient is in no acute distress. Patient is alert and oriented.  vitals were not taken for this visit. .  No significant changes. Lungs are clear to auscultation bilaterally. Heart has regular rate and rhythm. No palpable cervical, supraclavicular, or axillary adenopathy. Abdomen soft, non-tender, normal bowel sounds. ***   Lab Findings: Lab Results  Component Value Date   WBC 8.2 05/01/2021   HGB 12.2 05/01/2021   HCT 38.2 05/01/2021   MCV 91.2 05/01/2021   PLT 390 05/01/2021    Radiographic Findings: No results found.  Impression:  The encounter diagnosis was Mass of lingula of lung.   The primary encounter diagnosis was Nodule of left lung. Diagnoses of History of left breast cancer (2003) and Mass of lingula of lung were also pertinent to this visit.   Left lung  pulmonary nodule    The patient is recovering from the effects of radiation.  ***  Plan:  ***   *** minutes of total time was spent for this patient encounter, including preparation, face-to-face counseling with the patient and coordination of care, physical exam, and documentation of the encounter. ____________________________________  Blair Promise, PhD, MD  This document serves as a record of services personally performed by Gery Pray, MD. It was created on his behalf by Roney Mans, a trained  medical scribe. The creation of this record is based on the scribe's personal observations and the provider's statements to them. This document has been checked and approved by the attending provider.

## 2021-06-04 ENCOUNTER — Encounter: Payer: Self-pay | Admitting: Radiation Oncology

## 2021-06-04 ENCOUNTER — Ambulatory Visit
Admission: RE | Admit: 2021-06-04 | Discharge: 2021-06-04 | Disposition: A | Discharge status: Home / Self Care | Payer: Medicare Other | Source: Ambulatory Visit | Attending: Radiation Oncology | Admitting: Radiation Oncology

## 2021-06-04 DIAGNOSIS — F17210 Nicotine dependence, cigarettes, uncomplicated: Secondary | ICD-10-CM | POA: Insufficient documentation

## 2021-06-04 DIAGNOSIS — R918 Other nonspecific abnormal finding of lung field: Secondary | ICD-10-CM | POA: Insufficient documentation

## 2021-06-04 DIAGNOSIS — Z79899 Other long term (current) drug therapy: Secondary | ICD-10-CM | POA: Insufficient documentation

## 2021-06-04 DIAGNOSIS — Z7982 Long term (current) use of aspirin: Secondary | ICD-10-CM | POA: Insufficient documentation

## 2021-06-04 DIAGNOSIS — Z853 Personal history of malignant neoplasm of breast: Secondary | ICD-10-CM | POA: Diagnosis not present

## 2021-06-04 DIAGNOSIS — R5383 Other fatigue: Secondary | ICD-10-CM | POA: Insufficient documentation

## 2021-06-04 HISTORY — DX: Personal history of irradiation: Z92.3

## 2021-06-04 NOTE — Progress Notes (Signed)
Margaret Thompson is here today for follow up post radiation to the lung.  Lung Side: Left, patient completed treatment on 05/01/21  Does the patient complain of any of the following: Pain:No Shortness of breath w/wo exertion: No Cough: Yes, white thick sputum mostly in the morning.  Hemoptysis: No Pain with swallowing: No Swallowing/choking concerns: No Appetite: Fair, patient drinking ensure daily.  Energy Level: Low energy level.  Post radiation skin Changes: No    Additional comments if applicable: Patient reports having mastectomy to left breast 4-5 years ago., Patient states chest wall feels tight at times.   BP 136/67 (BP Location: Right Arm, Patient Position: Sitting)   Pulse 88   Temp (!) 96.5 F (35.8 C) (Temporal)   Resp 18   Ht 5\' 2"  (1.575 m)   Wt 110 lb (49.9 kg)   SpO2 98%   BMI 20.12 kg/m

## 2021-06-13 DIAGNOSIS — I2510 Atherosclerotic heart disease of native coronary artery without angina pectoris: Secondary | ICD-10-CM | POA: Diagnosis not present

## 2021-06-13 DIAGNOSIS — F17290 Nicotine dependence, other tobacco product, uncomplicated: Secondary | ICD-10-CM | POA: Diagnosis not present

## 2021-06-13 DIAGNOSIS — I10 Essential (primary) hypertension: Secondary | ICD-10-CM | POA: Diagnosis not present

## 2021-06-13 DIAGNOSIS — E785 Hyperlipidemia, unspecified: Secondary | ICD-10-CM | POA: Diagnosis not present

## 2021-06-20 NOTE — Progress Notes (Deleted)
GI Office Note    Referring Provider: Redmond School, MD Primary Care Physician:  Redmond School, MD Primary GI: Dr. Abbey Chatters  Date:  06/20/2021  ID:  Margaret Thompson, DOB 1933-02-17, MRN [id number]   Chief Complaint   No chief complaint on file.    History of Present Illness  Margaret Thompson is a 88 y.o. female with a history of stage II breast cancer s/p left mastectomy, anemia, acute MI, GERD, HTN, HLD, and lung cancer recently diagnosed in April 2023 currently receiving radiation therapy, and elevated alk phos with evaluation including negative for hepatitis, RUQ [place] with fatty liver, cholelithiasis without cholecystitis, normal CBD*** presenting today for follow up of GERD.  Last seen in the office 01/02/2021.  At this time she stated pantoprazole made her nauseous so she stopped taking it.  Also noting history of oral candidiasis previously treated with nystatin mouthwash with improvement in symptoms.  At this visit it was presumed that her GERD was not well controlled due to nausea, throat burning and daily vomiting.  She was to trial Dexilant 60 mg daily and call for formal prescription.  She was also given prescription for Magic mouthwash to take 3 times daily for throat burning secondary to oral thrush.  Last EGD 2019 with moderate Schatzki's ring s/p dilation, esophagitis, medium hiatal hernia with gastritis and duodenitis, biopsy negative for H. pylori.  Last colonoscopy 2010 with multiple colon polyps ranging from 4 to 8 mm (tubular adenoma and hyperplastic, 2 cecal AVMs, large internal hemorrhoids, pancolonic diverticulosis.  Patient has declined further colonoscopies given age.    Today: GERD -     Past Medical History:  Diagnosis Date   Acute MI ([place]) 2003   Adenocarcinoma of left breast ([place]) 11/23/2012   Stage II (T1 C. N1 M0) adenocarcinoma of the left breast with surgery on 03/08/2001. She had a 1.5 cm cancer with 1/6 positive sentinel nodes. Reexcision margins were  clear. She did participate in NSABP B.-30 randomized to Adriamycin and Taxotere for 4 cycles followed by radiation therapy from 07/13/2001 to 08/27/2001. She then started Arimidex on 07/07/2001 took that until the end of June 2008. T   Anemia    Bilateral cataracts    Breast cancer (HCC)    GERD (gastroesophageal reflux disease)    History of gout    History of radiation therapy    Left lung- 04/18/21-05/01/21- Dr. Gery Pray   Hypercholesteremia    Hypertension    Malignant neoplasm of lower-inner quadrant of left female breast [facility])    Osteoporosis     Past Surgical History:  Procedure Laterality Date   ABDOMINAL HYSTERECTOMY     BREAST SURGERY     CATARACT EXTRACTION W/PHACO Left 09/04/2014   Procedure: CATARACT EXTRACTION PHACO AND INTRAOCULAR LENS PLACEMENT ([place]);  Surgeon: Tonny Branch, MD;  Location: [facility];  Service: Ophthalmology;  Laterality: Left;  CDE:9.84   CATARACT EXTRACTION W/PHACO Right 11/09/2014   Procedure: CATARACT EXTRACTION PHACO AND INTRAOCULAR LENS PLACEMENT ([facility]);  Surgeon: Tonny Branch, MD;  Location: [facility];  Service: Ophthalmology;  Laterality: Right;  CDE:8.40   CORONARY ANGIOPLASTY     RCA stent   ESOPHAGOGASTRODUODENOSCOPY (EGD) WITH PROPOFOL N/A 08/04/2017   Fields: esophagitis, gastritis, no h.pylori. medium sized hiatal hernia, moderate schatzki ring s/p dilation   LEFT HEART CATHETERIZATION WITH CORONARY ANGIOGRAM N/A 12/21/2012   Procedure: LEFT HEART CATHETERIZATION WITH CORONARY ANGIOGRAM;  Surgeon: Clent Demark, MD;  Location: [place] CATH LAB;  Service: Cardiovascular;  Laterality: N/A;   MASTECTOMY MODIFIED RADICAL Left 01/16/2017   Procedure: SIMPLE MASTECTOMY;  Surgeon: Aviva Signs, MD;  Location: [facility];  Service: General;  Laterality: Left;   MASTECTOMY, PARTIAL Left    PERCUTANEOUS STENT INTERVENTION     RCA   PORT-A-CATH REMOVAL     PORT-A-CATH REMOVAL Right 09/13/2018   Procedure: REMOVAL PORT-A-CATH;  Surgeon: Aviva Signs, MD;  Location:  [facility];  Service: General;  Laterality: Right;   PORTACATH PLACEMENT     PORTACATH PLACEMENT Right 03/11/2017   Procedure: INSERTION PORT-A-CATH;  Surgeon: Aviva Signs, MD;  Location: [facility];  Service: General;  Laterality: Right;   SAVORY DILATION N/A 08/04/2017   Procedure: SAVORY DILATION;  Surgeon: Danie Binder, MD;  Location: [facility];  Service: Endoscopy;  Laterality: N/A;    Current Outpatient Medications  Medication Sig Dispense Refill   acetaminophen (TYLENOL) 325 MG tablet Take 650 mg by mouth every 6 (six) hours as needed.     aspirin EC 81 MG tablet Take 81 mg by mouth daily.     atorvastatin (LIPITOR) 40 MG tablet Take 40 mg by mouth daily.     diltiazem (CARDIZEM CD) 180 MG 24 hr capsule Take 180 mg by mouth daily.     pantoprazole (PROTONIX) 40 MG tablet Take 1 tablet (40 mg total) by mouth 2 (two) times daily before a meal. (Patient not taking: Reported on 01/24/2021) 60 tablet 5   predniSONE (DELTASONE) 50 MG tablet Take one tablet 13 hours before scan, 7 hours before scan, and 1 hour prior to scan. (Patient not taking: Reported on 01/24/2021) 3 tablet 0   No current facility-administered medications for this visit.    Allergies as of 06/24/2021 - Review Complete 06/04/2021  Allergen Reaction Noted   Azithromycin Anaphylaxis and Hives 09/15/2010   Ivp dye [iodinated contrast media] Hives 11/22/2020   Cipro [ciprofloxacin hcl]  04/09/2020   Gatifloxacin  04/09/2020   Levaquin [levofloxacin]  04/09/2020    Family History  Problem Relation Age of Onset   Stroke Mother    Colon cancer Neg Hx    Gastric cancer Neg Hx    Esophageal cancer Neg Hx     Social History   Socioeconomic History   Marital status: Widowed    Spouse name: Not on file   Number of children: Not on file   Years of education: Not on file   Highest education level: Not on file  Occupational History   Not on file  Tobacco Use   Smoking status: Every Day    Packs/day: 0.50    Years:  50.00    Total pack years: 25.00    Types: Cigarettes   Smokeless tobacco: Never   Tobacco comments:    10 cigarettes daily  Vaping Use   Vaping Use: Never used  Substance and Sexual Activity   Alcohol use: No   Drug use: No   Sexual activity: Not Currently    Birth control/protection: Surgical  Other Topics Concern   Not on file  Social History Narrative   Not on file   Social Determinants of Health   Financial Resource Strain: Not on file  Food Insecurity: Not on file  Transportation Needs: Not on file  Physical Activity: Not on file  Stress: Not on file  Social Connections: Not on file     Review of Systems   Gen: Denies fever, chills, anorexia. Denies fatigue, weakness, weight loss.  CV: Denies chest pain, palpitations, syncope,  peripheral edema, and claudication. Resp: Denies dyspnea at rest, cough, wheezing, coughing up blood, and pleurisy. GI: see HPI Derm: Denies rash, itching, dry skin Psych: Denies depression, anxiety, memory loss, confusion. No homicidal or suicidal ideation.  Heme: Denies bruising, bleeding, and enlarged lymph nodes.   Physical Exam   There were no vitals taken for this visit.  General:   Alert and oriented. No distress noted. Pleasant and cooperative.  Head:  Normocephalic and atraumatic. Eyes:  Conjuctiva clear without scleral icterus. Mouth:  Oral mucosa pink and moist. Good dentition. No lesions. Lungs:  Clear to auscultation bilaterally. No wheezes, rales, or rhonchi. No distress.  Heart:  S1, S2 present without murmurs appreciated.  Abdomen:  +BS, soft, non-tender and non-distended. No rebound or guarding. No HSM or masses noted. Rectal: *** Msk:  Symmetrical without gross deformities. Normal posture. Extremities:  Without edema. Neurologic:  Alert and  oriented x4 Psych:  Alert and cooperative. Normal mood and affect.   Assessment  Margaret Thompson is a 88 y.o. female with a history of MI, breast cancer s/p left mastectomy,  elevated alk phos with negative work-up, HTN, HLD, and left lung cancer currently undergoing radiation therapy*** presenting today with ***  GERD:    PLAN   ***     Venetia Night, MSN, FNP-BC, AGACNP-BC [facility]

## 2021-06-24 ENCOUNTER — Ambulatory Visit: Payer: Medicare Other | Admitting: Gastroenterology

## 2021-06-25 NOTE — Progress Notes (Signed)
GI Office Note    Referring Provider: Redmond School, MD Primary Care Physician:  Redmond School, MD Primary GI: Dr. Abbey Chatters   Date:  06/27/2021  ID:  Margaret Thompson, DOB 12/24/1933, MRN [id number]   Chief Complaint   Chief Complaint  Patient presents with   Follow-up    Swallowing problems, food getting stuck in throat      History of Present Illness  Margaret Thompson is a 88 y.o. female presenting today with a history of MI in 2003, adenocarcinoma of the breast in 2014 s/p left mastectomy, GERD, and left lung mass s/p SBRT presenting today with complaint of dysphagia.  Last EGD 2019 - esophagitis, gastritis, no h.pylori. medium sized hiatal hernia, moderate schatzki ring s/p dilation  Last seen in the office 01/02/2021 -noted history of chronic GERD has stopped taking her pantoprazole.  Noted history of oral candidiasis treated with nystatin mouthwash previously.  Was denying any current symptoms.  Felt as though her throat burning was due to oral thrush, Magic mouthwash given to patient to use 3 times daily.  She was given samples of Dexilant.  She was to follow-up in 6 weeks or sooner if needed  Has been receiving SBRT for treatment of her left lung mass. Received 5 treatments.   Today: Dysphagia - Having issues with her throat burning and issues swallowing solids mostly feeling stuck at the base of her neck. When it gets stuck she gets nauseas and will make herself throw up. Throat burning happens in the evenings. Has not tried any over the counter medications. Does have coughing in the mornings and coughs up thick white stuff. No specific food triggers. Previously had tried protonix twice daily but that did not help. No abdominal pain. Feels some left chest tightness when sfood is getting stuck. Has been drinking lots of water recently.   Burning sensation at night never happened until she started having radiation. She has recently finished with her treatments. Has CT scheduled for  July. Has no energy but not having any SOB. Still able to drive.   Liquid medications make her nauseas and vomit.   Taking cardizem due to her MI. No bleeding episodes.    Past Medical History:  Diagnosis Date   Acute MI ([place]) 2003   Adenocarcinoma of left breast ([place]) 11/23/2012   Stage II (T1 C. N1 M0) adenocarcinoma of the left breast with surgery on 03/08/2001. She had a 1.5 cm cancer with 1/6 positive sentinel nodes. Reexcision margins were clear. She did participate in NSABP B.-30 randomized to Adriamycin and Taxotere for 4 cycles followed by radiation therapy from 07/13/2001 to 08/27/2001. She then started Arimidex on 07/07/2001 took that until the end of June 2008. T   Anemia    Bilateral cataracts    Breast cancer (HCC)    GERD (gastroesophageal reflux disease)    History of gout    History of radiation therapy    Left lung- 04/18/21-05/01/21- Dr. Gery Pray   Hypercholesteremia    Hypertension    Malignant neoplasm of lower-inner quadrant of left female breast [facility])    Osteoporosis     Past Surgical History:  Procedure Laterality Date   ABDOMINAL HYSTERECTOMY     BREAST SURGERY     CATARACT EXTRACTION W/PHACO Left 09/04/2014   Procedure: CATARACT EXTRACTION PHACO AND INTRAOCULAR LENS PLACEMENT ([place]);  Surgeon: Tonny Branch, MD;  Location: [facility];  Service: Ophthalmology;  Laterality: Left;  CDE:9.84   CATARACT EXTRACTION W/PHACO Right 11/09/2014  Procedure: CATARACT EXTRACTION PHACO AND INTRAOCULAR LENS PLACEMENT ([facility]);  Surgeon: Tonny Branch, MD;  Location: [facility];  Service: Ophthalmology;  Laterality: Right;  CDE:8.40   CORONARY ANGIOPLASTY     RCA stent   ESOPHAGOGASTRODUODENOSCOPY (EGD) WITH PROPOFOL N/A 08/04/2017   Fields: esophagitis, gastritis, no h.pylori. medium sized hiatal hernia, moderate schatzki ring s/p dilation   LEFT HEART CATHETERIZATION WITH CORONARY ANGIOGRAM N/A 12/21/2012   Procedure: LEFT HEART CATHETERIZATION WITH CORONARY ANGIOGRAM;  Surgeon:  Clent Demark, MD;  Location: [place] CATH LAB;  Service: Cardiovascular;  Laterality: N/A;   MASTECTOMY MODIFIED RADICAL Left 01/16/2017   Procedure: SIMPLE MASTECTOMY;  Surgeon: Aviva Signs, MD;  Location: [facility];  Service: General;  Laterality: Left;   MASTECTOMY, PARTIAL Left    PERCUTANEOUS STENT INTERVENTION     RCA   PORT-A-CATH REMOVAL     PORT-A-CATH REMOVAL Right 09/13/2018   Procedure: REMOVAL PORT-A-CATH;  Surgeon: Aviva Signs, MD;  Location: [facility];  Service: General;  Laterality: Right;   PORTACATH PLACEMENT     PORTACATH PLACEMENT Right 03/11/2017   Procedure: INSERTION PORT-A-CATH;  Surgeon: Aviva Signs, MD;  Location: [facility];  Service: General;  Laterality: Right;   SAVORY DILATION N/A 08/04/2017   Procedure: SAVORY DILATION;  Surgeon: Danie Binder, MD;  [facility];  Service: Endoscopy;  Laterality: N/A;    Current Outpatient Medications  Medication Sig Dispense Refill   aspirin EC 81 MG tablet Take 81 mg by mouth daily.     atorvastatin (LIPITOR) 40 MG tablet Take 40 mg by mouth daily.     diltiazem (CARDIZEM CD) 180 MG 24 hr capsule Take 180 mg by mouth daily.     acetaminophen (TYLENOL) 325 MG tablet Take 650 mg by mouth every 6 (six) hours as needed. (Patient not taking: Reported on 06/27/2021)     pantoprazole (PROTONIX) 40 MG tablet Take 1 tablet (40 mg total) by mouth 2 (two) times daily before a meal. (Patient not taking: Reported on 01/24/2021) 60 tablet 5   predniSONE (DELTASONE) 50 MG tablet Take one tablet 13 hours before scan, 7 hours before scan, and 1 hour prior to scan. (Patient not taking: Reported on 01/24/2021) 3 tablet 0   No current facility-administered medications for this visit.    Allergies as of 06/27/2021 - Review Complete 06/27/2021  Allergen Reaction Noted   Azithromycin Anaphylaxis and Hives 09/15/2010   Ivp dye [iodinated contrast media] Hives 11/22/2020   Cipro [ciprofloxacin hcl]  04/09/2020   Gatifloxacin  04/09/2020    Levaquin [levofloxacin]  04/09/2020    Family History  Problem Relation Age of Onset   Stroke Mother    Colon cancer Neg Hx    Gastric cancer Neg Hx    Esophageal cancer Neg Hx     Social History   Socioeconomic History   Marital status: Widowed    Spouse name: Not on file   Number of children: Not on file   Years of education: Not on file   Highest education level: Not on file  Occupational History   Not on file  Tobacco Use   Smoking status: Every Day    Packs/day: 0.50    Years: 50.00    Total pack years: 25.00    Types: Cigarettes   Smokeless tobacco: Never   Tobacco comments:    10 cigarettes daily  Vaping Use   Vaping Use: Never used  Substance and Sexual Activity   Alcohol use: No   Drug use:  No   Sexual activity: Not Currently    Birth control/protection: Surgical  Other Topics Concern   Not on file  Social History Narrative   Not on file   Social Determinants of Health   Financial Resource Strain: Not on file  Food Insecurity: Not on file  Transportation Needs: Not on file  Physical Activity: Not on file  Stress: Not on file  Social Connections: Not on file     Review of Systems   Gen: Denies fever, chills, anorexia. Denies fatigue, weakness, weight loss.  CV: Denies chest pain, palpitations, syncope, peripheral edema, and claudication. Resp: Denies dyspnea at rest, cough, wheezing, coughing up blood, and pleurisy. GI: see HPI Derm: Denies rash, itching, dry skin Psych: Denies depression, anxiety, memory loss, confusion. No homicidal or suicidal ideation.  Heme: Denies bruising, bleeding, and enlarged lymph nodes.   Physical Exam   BP 113/62   Pulse (!) 105   Temp 97.7 F (36.5 C) (Temporal)   Ht 5\' 2"  (1.575 m)   Wt 109 lb (49.4 kg)   BMI 19.94 kg/m   General:   Alert and oriented. No distress noted. Pleasant and cooperative.  Head:  Normocephalic and atraumatic. Eyes:  Conjuctiva clear without scleral icterus. Mouth:  Oral  mucosa pink and moist. Good dentition. No lesions. Lungs:  Clear to auscultation bilaterally. No wheezes, rales, or rhonchi. No distress.  Heart:  S1, S2 present without murmurs appreciated.  Abdomen:  +BS, soft, non-distended.  Mild tenderness to left lower quadrant.  No rebound or guarding. No HSM or masses noted. Rectal: Deferred Msk:  Symmetrical without gross deformities. Normal posture. Extremities:  Without edema. Neurologic:  Alert and  oriented x4 Psych:  Alert and cooperative. Normal mood and affect.   Assessment  Margaret Thompson is a 88 y.o. female with a history of MI in 2003, adenocarcinoma the breast in 2014 s/p left mastectomy, GERD, and left lung mass s/p SBRT presenting today with dysphagia.  Dysphagia: Solid food dysphagia.  Feels like food is getting stuck at the base of her neck, sometimes will get nauseous with this and will have to make herself vomit.  Last EGD in 2019 with mild Schatzki's ring s/p dilation.  Suspect that her dysphagia is related to worsening acid reflux that she has not been on any PPI or possibly due to radiation therapy.  Has been receiving SBRT, has had 5 treatments and is due for follow-up CT scan next month.  She states her symptoms did not occur until after she started radiation therapy.  We will treat as acid reflux first.  Discussed BPE versus EGD with patient.  Would like to try conservative management with medication for acid reflux, if symptoms do not improve within a few weeks we will likely proceed with BPE and then with EGD if needed.  Weight has been stable and she has been taking in boost to help with her protein intake.  GERD: Stated pantoprazole 40 mg twice daily was previously not helpful so she stopped taking it.  She reports of burning sensation that typically happens in the evenings, does not have to be lying down for it to occur.  Sometimes has a bad taste in her mouth as well.  Does have frequent coughing however this likely could have been  related to radiation therapy for her lung cancer, sputum is white/clear.  Suspect reflux has been worsening due to radiation therapy.  Does not appear that she has oral thrush on exam.  We  will trial Omeprazole 40 mg 30 minutes prior to dinner and she is to call with a progress report in 2 to 3 weeks.  PLAN   Omeprazole 40 mg 30 minutes prior to dinner.  MiraLAX 1 capful daily. Progress report in 2-3 weeks.  BPE vs EGD if medication not helpful in 2-3 weeks.  Follow-up in 3 months   Venetia Night, MSN, FNP-BC, AGACNP-BC [facility]

## 2021-06-27 ENCOUNTER — Ambulatory Visit: Payer: Medicare Other | Admitting: Gastroenterology

## 2021-06-27 ENCOUNTER — Encounter: Payer: Self-pay | Admitting: Gastroenterology

## 2021-06-27 VITALS — BP 113/62 | HR 105 | Temp 97.7°F | Ht 62.0 in | Wt 109.0 lb

## 2021-06-27 DIAGNOSIS — K2100 Gastro-esophageal reflux disease with esophagitis, without bleeding: Secondary | ICD-10-CM | POA: Diagnosis not present

## 2021-06-27 DIAGNOSIS — R1319 Other dysphagia: Secondary | ICD-10-CM | POA: Diagnosis not present

## 2021-06-27 MED ORDER — OMEPRAZOLE 40 MG PO CPDR: 40.0000 mg | DELAYED_RELEASE_CAPSULE | Freq: Every day | ORAL | 3 refills | Status: DC

## 2021-06-27 NOTE — Patient Instructions (Signed)
I suspect that some of your troubles with swallowing and your nausea could be a exacerbation of your reflux. I have sent in omeprazole 40 mg for you to take 30 minutes prior to dinner. (This works better on Systems developer).  Please let me know how you are doing in a few weeks, if your swallowing has not improved and your nausea has not improved we can get you scheduled for a barium pill esophagram to further evaluate your swallowing issue.  For your constipation: I when she began taking MiraLAX (or over-the-counter alternative - main ingredient is polyethylene glycol) 1 capful daily and making sure you have plenty of water intake. Increasing some fiber in your diet would also likely help.  You can mix in Benefiber 2 teaspoons daily into your water, this is also tasteless.  It was a pleasure to see you today. I want to create trusting relationships with patients. If you receive a survey regarding your visit,  I greatly appreciate you taking time to fill this out on paper or through your MyChart. I value your feedback.  Venetia Night, MSN, FNP-BC, AGACNP-BC [facility]

## 2021-07-08 ENCOUNTER — Ambulatory Visit: Payer: Medicare Other | Admitting: Gastroenterology

## 2021-07-10 DIAGNOSIS — H04123 Dry eye syndrome of bilateral lacrimal glands: Secondary | ICD-10-CM | POA: Diagnosis not present

## 2021-07-12 ENCOUNTER — Ambulatory Visit
Admission: EM | Admit: 2021-07-12 | Discharge: 2021-07-12 | Disposition: A | Discharge status: Home / Self Care | Payer: Medicare Other | Attending: Family Medicine | Admitting: Family Medicine

## 2021-07-12 DIAGNOSIS — L509 Urticaria, unspecified: Secondary | ICD-10-CM

## 2021-07-12 MED ORDER — TRIAMCINOLONE ACETONIDE 0.1 % EX CREA: 1.0000 | TOPICAL_CREAM | Freq: Two times a day (BID) | CUTANEOUS | 0 refills | Status: DC

## 2021-07-12 MED ORDER — DEXAMETHASONE SODIUM PHOSPHATE 10 MG/ML IJ SOLN
10.0000 mg | Freq: Once | INTRAMUSCULAR | Status: AC
  Administered 2021-07-12: 10 mg via INTRAMUSCULAR

## 2021-07-12 MED ORDER — CETIRIZINE HCL 5 MG PO TABS: 5.0000 mg | ORAL_TABLET | Freq: Two times a day (BID) | ORAL | 0 refills | Status: DC

## 2021-07-12 NOTE — ED Provider Notes (Signed)
[facility]    CSN: [id number] Arrival date & time: 07/12/21  1358      History   Chief Complaint Chief Complaint  Patient presents with   Rash    HPI Margaret Thompson is a 88 y.o. female.   Patient presenting today with intensely pruritic rash to back, abdomen, bilateral underarms, bilateral groin area and down upper legs that she first noticed last night.  She denies any new hygiene products, medications, supplements, foods and also denies throat itching or swelling, chest tightness, wheezing, shortness of breath, nausea, vomiting, abdominal pain.  So far not trying anything over-the-counter for symptoms.   Past Medical History:  Diagnosis Date   Acute MI ([place]) 2003   Adenocarcinoma of left breast ([place]) 11/23/2012   Stage II (T1 C. N1 M0) adenocarcinoma of the left breast with surgery on 03/08/2001. She had a 1.5 cm cancer with 1/6 positive sentinel nodes. Reexcision margins were clear. She did participate in NSABP B.-30 randomized to Adriamycin and Taxotere for 4 cycles followed by radiation therapy from 07/13/2001 to 08/27/2001. She then started Arimidex on 07/07/2001 took that until the end of June 2008. T   Anemia    Bilateral cataracts    Breast cancer (HCC)    GERD (gastroesophageal reflux disease)    History of gout    History of radiation therapy    Left lung- 04/18/21-05/01/21- Dr. 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   Oral thrush 12/21/2019   Postprandial vomiting 09/21/2019   Abnormal LFTs 09/21/2019   Constipation 11/30/2018   Preventative health care 05/26/2018   Abdominal pain, epigastric 11/24/2017   Odynophagia 06/04/2017   GERD (gastroesophageal reflux disease) 06/04/2017   Malignant neoplasm of left female breast ([place])    Acute MI, inferoposterior  wall ([place]) 12/21/2012   History of left breast cancer (2003) 11/23/2012   Bursitis, shoulder 05/25/2012    Past Surgical History:  Procedure Laterality Date   ABDOMINAL HYSTERECTOMY     BREAST SURGERY     CATARACT EXTRACTION W/PHACO Left 09/04/2014   Procedure: CATARACT EXTRACTION PHACO AND INTRAOCULAR LENS PLACEMENT ([place]);  Surgeon: Tonny Branch, MD;  Location: [facility];  Service: Ophthalmology;  Laterality: Left;  CDE:9.84   CATARACT EXTRACTION W/PHACO Right 11/09/2014   Procedure: CATARACT EXTRACTION PHACO AND INTRAOCULAR LENS PLACEMENT ([facility]);  Surgeon: Tonny Branch, MD;  Location: [facility];  Service: Ophthalmology;  Laterality: Right;  CDE:8.40   CORONARY ANGIOPLASTY     RCA stent   ESOPHAGOGASTRODUODENOSCOPY (EGD) WITH PROPOFOL N/A 08/04/2017   Fields: esophagitis, gastritis, no h.pylori. medium sized hiatal hernia, moderate schatzki ring s/p dilation   LEFT HEART CATHETERIZATION WITH CORONARY ANGIOGRAM N/A 12/21/2012   Procedure: LEFT HEART CATHETERIZATION WITH CORONARY ANGIOGRAM;  Surgeon: Clent Demark, MD;  Location: [facility];  Service: Cardiovascular;  Laterality: N/A;   MASTECTOMY MODIFIED RADICAL Left 01/16/2017   Procedure: SIMPLE MASTECTOMY;  Surgeon: Aviva Signs, MD;  Location: [facility];  Service: General;  Laterality: Left;   MASTECTOMY, PARTIAL Left    PERCUTANEOUS STENT INTERVENTION     RCA   PORT-A-CATH REMOVAL     PORT-A-CATH REMOVAL Right 09/13/2018   Procedure: REMOVAL PORT-A-CATH;  Surgeon: Aviva Signs, MD;  Location: [facility];  Service: General;  Laterality: Right;  PORTACATH PLACEMENT     PORTACATH PLACEMENT Right 03/11/2017   Procedure: INSERTION PORT-A-CATH;  Surgeon: Aviva Signs, MD;  Location: [facility];  Service: General;  Laterality: Right;   SAVORY DILATION N/A 08/04/2017   Procedure: SAVORY DILATION;  Surgeon: Danie Binder, MD;  Location: [facility];  Service: Endoscopy;  Laterality: N/A;    OB History   No obstetric history on file.      Home  Medications    Prior to Admission medications   Medication Sig Start Date End Date Taking? Authorizing Provider  cetirizine (ZYRTEC) 5 MG tablet Take 1 tablet (5 mg total) by mouth 2 (two) times daily for 10 days. 07/12/21 07/22/21 Yes Volney American, PA-C  triamcinolone cream (KENALOG) 0.1 % Apply 1 Application topically 2 (two) times daily. 07/12/21  Yes Volney American, PA-C  aspirin EC 81 MG tablet Take 81 mg by mouth daily.    [provider]  atorvastatin (LIPITOR) 40 MG tablet Take 40 mg by mouth daily. 11/26/16   [provider]  diltiazem (CARDIZEM CD) 180 MG 24 hr capsule Take 180 mg by mouth daily. 03/09/20   [provider]  omeprazole (PRILOSEC) 40 MG capsule Take 1 capsule (40 mg total) by mouth daily. 06/27/21   Sherron Monday, NP    Family History Family History  Problem Relation Age of Onset   Stroke Mother    Colon cancer Neg Hx    Gastric cancer Neg Hx    Esophageal cancer Neg Hx     Social History Social History   Tobacco Use   Smoking status: Every Day    Packs/day: 0.50    Years: 50.00    Total pack years: 25.00    Types: Cigarettes   Smokeless tobacco: Never   Tobacco comments:    10 cigarettes daily  Vaping Use   Vaping Use: Never used  Substance Use Topics   Alcohol use: No   Drug use: No     Allergies   Azithromycin, Ivp dye [iodinated contrast media], Cipro [ciprofloxacin hcl], Gatifloxacin, and Levaquin [levofloxacin]   Review of Systems Review of Systems Per HPI  Physical Exam Triage Vital Signs ED Triage Vitals  Enc Vitals Group     BP 07/12/21 1422 128/68     Pulse Rate 07/12/21 1422 (!) 102     Resp 07/12/21 1422 20     Temp 07/12/21 1422 98.2 F (36.8 C)     Temp Source 07/12/21 1422 Oral     SpO2 07/12/21 1422 93 %     Weight --      Height --      Head Circumference --      Peak Flow --      Pain Score 07/12/21 1421 0     Pain Loc --      Pain Edu? --      Excl. in [place]? --     No data found.  Updated Vital Signs BP 128/68 (BP Location: Right Arm)   Pulse (!) 102   Temp 98.2 F (36.8 C) (Oral)   Resp 20   SpO2 93%   Visual Acuity Right Eye Distance:   Left Eye Distance:   Bilateral Distance:    Right Eye Near:   Left Eye Near:    Bilateral Near:     Physical Exam Vitals and nursing note reviewed.  Constitutional:      Appearance: Normal appearance. She is not ill-appearing.  HENT:  Head: Atraumatic.     Mouth/Throat:     Mouth: Mucous membranes are moist.  Eyes:     Extraocular Movements: Extraocular movements intact.     Conjunctiva/sclera: Conjunctivae normal.  Cardiovascular:     Rate and Rhythm: Normal rate and regular rhythm.     Heart sounds: Normal heart sounds.  Pulmonary:     Effort: Pulmonary effort is normal.     Breath sounds: Normal breath sounds. No wheezing or rales.  Musculoskeletal:        General: Normal range of motion.     Cervical back: Normal range of motion and neck supple.  Skin:    General: Skin is warm and dry.     Findings: Rash present.     Comments: Urticarial maculopapular rash widespread across bilateral axillary regions, abdomen, back, upper legs  Neurological:     Mental Status: She is alert and oriented to person, place, and time.  Psychiatric:        Mood and Affect: Mood normal.        Thought Content: Thought content normal.        Judgment: Judgment normal.    UC Treatments / Results  Labs (all labs ordered are listed, but only abnormal results are displayed) Labs Reviewed - No data to display  EKG   Radiology No results found.  Procedures Procedures (including critical care time)  Medications Ordered in UC Medications  dexamethasone (DECADRON) injection 10 mg (10 mg Intramuscular Given 07/12/21 1440)    Initial Impression / Assessment and Plan / UC Course  I have reviewed the triage vital signs and the nursing notes.  Pertinent labs & imaging results that were available  during my care of the patient were reviewed by me and considered in my medical decision making (see chart for details).     Consistent with hives rash, unclear etiology.  We will treat with IM Decadron, Zyrtec, triamcinolone cream and avoidance of any potential triggers.  Discussed supportive care and strict return precautions for worsening symptoms.  PCP follow-up recommended first thing next week.  Final Clinical Impressions(s) / UC Diagnoses   Final diagnoses:  Hives   Discharge Instructions   None    ED Prescriptions     Medication Sig Dispense Auth. Provider   cetirizine (ZYRTEC) 5 MG tablet Take 1 tablet (5 mg total) by mouth 2 (two) times daily for 10 days. 20 tablet Volney American, [place]   triamcinolone cream (KENALOG) 0.1 % Apply 1 Application topically 2 (two) times daily. 80 g Volney American, [place]      PDMP not reviewed this encounter.   Volney American, [place] 07/12/21 1710

## 2021-07-12 NOTE — ED Triage Notes (Signed)
Pt reports itching rash in abdomen, axillas and groin since last night.

## 2021-07-30 ENCOUNTER — Other Ambulatory Visit (HOSPITAL_COMMUNITY): Payer: Self-pay | Admitting: *Deleted

## 2021-07-30 ENCOUNTER — Other Ambulatory Visit (HOSPITAL_COMMUNITY): Payer: Self-pay

## 2021-07-30 DIAGNOSIS — I739 Peripheral vascular disease, unspecified: Secondary | ICD-10-CM | POA: Diagnosis not present

## 2021-07-30 DIAGNOSIS — M79675 Pain in left toe(s): Secondary | ICD-10-CM | POA: Diagnosis not present

## 2021-07-30 DIAGNOSIS — M79671 Pain in right foot: Secondary | ICD-10-CM | POA: Diagnosis not present

## 2021-07-30 DIAGNOSIS — M79672 Pain in left foot: Secondary | ICD-10-CM | POA: Diagnosis not present

## 2021-07-30 DIAGNOSIS — C50912 Malignant neoplasm of unspecified site of left female breast: Secondary | ICD-10-CM

## 2021-07-30 DIAGNOSIS — L609 Nail disorder, unspecified: Secondary | ICD-10-CM | POA: Diagnosis not present

## 2021-07-30 DIAGNOSIS — M79674 Pain in right toe(s): Secondary | ICD-10-CM | POA: Diagnosis not present

## 2021-07-30 MED ORDER — PREDNISONE 50 MG PO TABS: ORAL_TABLET | ORAL | 0 refills | Status: DC

## 2021-07-31 ENCOUNTER — Ambulatory Visit (HOSPITAL_COMMUNITY)
Admission: RE | Admit: 2021-07-31 | Discharge: 2021-07-31 | Disposition: A | Discharge status: Home / Self Care | Payer: Medicare Other | Source: Ambulatory Visit | Attending: Hematology | Admitting: Hematology

## 2021-07-31 ENCOUNTER — Inpatient Hospital Stay (HOSPITAL_COMMUNITY): Payer: Medicare Other | Attending: Hematology

## 2021-07-31 DIAGNOSIS — C50912 Malignant neoplasm of unspecified site of left female breast: Secondary | ICD-10-CM

## 2021-07-31 DIAGNOSIS — Z923 Personal history of irradiation: Secondary | ICD-10-CM | POA: Insufficient documentation

## 2021-07-31 DIAGNOSIS — Z7952 Long term (current) use of systemic steroids: Secondary | ICD-10-CM | POA: Insufficient documentation

## 2021-07-31 DIAGNOSIS — Z79899 Other long term (current) drug therapy: Secondary | ICD-10-CM | POA: Diagnosis not present

## 2021-07-31 DIAGNOSIS — Z7982 Long term (current) use of aspirin: Secondary | ICD-10-CM | POA: Insufficient documentation

## 2021-07-31 DIAGNOSIS — M810 Age-related osteoporosis without current pathological fracture: Secondary | ICD-10-CM | POA: Diagnosis not present

## 2021-07-31 DIAGNOSIS — Z853 Personal history of malignant neoplasm of breast: Secondary | ICD-10-CM | POA: Diagnosis not present

## 2021-07-31 DIAGNOSIS — C3492 Malignant neoplasm of unspecified part of left bronchus or lung: Secondary | ICD-10-CM | POA: Diagnosis not present

## 2021-07-31 DIAGNOSIS — R911 Solitary pulmonary nodule: Secondary | ICD-10-CM | POA: Diagnosis not present

## 2021-07-31 DIAGNOSIS — Z9221 Personal history of antineoplastic chemotherapy: Secondary | ICD-10-CM | POA: Diagnosis not present

## 2021-07-31 DIAGNOSIS — E559 Vitamin D deficiency, unspecified: Secondary | ICD-10-CM | POA: Diagnosis not present

## 2021-07-31 DIAGNOSIS — Z9012 Acquired absence of left breast and nipple: Secondary | ICD-10-CM | POA: Diagnosis not present

## 2021-07-31 DIAGNOSIS — R918 Other nonspecific abnormal finding of lung field: Secondary | ICD-10-CM | POA: Insufficient documentation

## 2021-07-31 LAB — CBC WITH DIFFERENTIAL/PLATELET
Abs Immature Granulocytes: 0.02 10*3/uL (ref 0.00–0.07)
Basophils Absolute: 0 10*3/uL (ref 0.0–0.1)
Basophils Relative: 0 %
Eosinophils Absolute: 0 10*3/uL (ref 0.0–0.5)
Eosinophils Relative: 0 %
HCT: 38.9 % (ref 36.0–46.0)
Hemoglobin: 12.7 g/dL (ref 12.0–15.0)
Immature Granulocytes: 0 %
Lymphocytes Relative: 9 %
Lymphs Abs: 0.6 10*3/uL — ABNORMAL LOW (ref 0.7–4.0)
MCH: 30.6 pg (ref 26.0–34.0)
MCHC: 32.6 g/dL (ref 30.0–36.0)
MCV: 93.7 fL (ref 80.0–100.0)
Monocytes Absolute: 0.1 10*3/uL (ref 0.1–1.0)
Monocytes Relative: 1 %
Neutro Abs: 6.1 10*3/uL (ref 1.7–7.7)
Neutrophils Relative %: 90 %
Platelets: 283 10*3/uL (ref 150–400)
RBC: 4.15 MIL/uL (ref 3.87–5.11)
RDW: 14 % (ref 11.5–15.5)
WBC: 6.7 10*3/uL (ref 4.0–10.5)
nRBC: 0 % (ref 0.0–0.2)

## 2021-07-31 LAB — COMPREHENSIVE METABOLIC PANEL
ALT: 19 U/L (ref 0–44)
AST: 21 U/L (ref 15–41)
Albumin: 3.8 g/dL (ref 3.5–5.0)
Alkaline Phosphatase: 82 U/L (ref 38–126)
Anion gap: 8 (ref 5–15)
BUN: 11 mg/dL (ref 8–23)
CO2: 22 mmol/L (ref 22–32)
Calcium: 9.5 mg/dL (ref 8.9–10.3)
Chloride: 106 mmol/L (ref 98–111)
Creatinine, Ser: 0.85 mg/dL (ref 0.44–1.00)
GFR, Estimated: >60 mL/min (ref >60)
Glucose, Bld: 161 mg/dL — ABNORMAL HIGH (ref 70–99)
Potassium: 3.8 mmol/L (ref 3.5–5.1)
Sodium: 136 mmol/L (ref 135–145)
Total Bilirubin: 0.6 mg/dL (ref 0.3–1.2)
Total Protein: 7.6 g/dL (ref 6.5–8.1)

## 2021-07-31 LAB — VITAMIN D 25 HYDROXY (VIT D DEFICIENCY, FRACTURES): Vit D, 25-Hydroxy: 39.84 ng/mL (ref 30–100)

## 2021-07-31 MED ORDER — IOHEXOL 300 MG/ML  SOLN
75.0000 mL | Freq: Once | INTRAMUSCULAR | Status: AC | PRN
  Administered 2021-07-31: 75 mL via INTRAVENOUS

## 2021-08-01 DIAGNOSIS — Z681 Body mass index (BMI) 19 or less, adult: Secondary | ICD-10-CM | POA: Diagnosis not present

## 2021-08-01 DIAGNOSIS — E782 Mixed hyperlipidemia: Secondary | ICD-10-CM | POA: Diagnosis not present

## 2021-08-01 DIAGNOSIS — E538 Deficiency of other specified B group vitamins: Secondary | ICD-10-CM | POA: Diagnosis not present

## 2021-08-01 DIAGNOSIS — K219 Gastro-esophageal reflux disease without esophagitis: Secondary | ICD-10-CM | POA: Diagnosis not present

## 2021-08-01 DIAGNOSIS — J449 Chronic obstructive pulmonary disease, unspecified: Secondary | ICD-10-CM | POA: Diagnosis not present

## 2021-08-01 DIAGNOSIS — I2510 Atherosclerotic heart disease of native coronary artery without angina pectoris: Secondary | ICD-10-CM | POA: Diagnosis not present

## 2021-08-01 DIAGNOSIS — N182 Chronic kidney disease, stage 2 (mild): Secondary | ICD-10-CM | POA: Diagnosis not present

## 2021-08-01 DIAGNOSIS — Z91041 Radiographic dye allergy status: Secondary | ICD-10-CM | POA: Diagnosis not present

## 2021-08-01 DIAGNOSIS — Z0001 Encounter for general adult medical examination with abnormal findings: Secondary | ICD-10-CM | POA: Diagnosis not present

## 2021-08-01 DIAGNOSIS — E559 Vitamin D deficiency, unspecified: Secondary | ICD-10-CM | POA: Diagnosis not present

## 2021-08-01 DIAGNOSIS — M810 Age-related osteoporosis without current pathological fracture: Secondary | ICD-10-CM | POA: Diagnosis not present

## 2021-08-07 ENCOUNTER — Encounter (HOSPITAL_COMMUNITY): Payer: Self-pay | Admitting: Hematology

## 2021-08-07 ENCOUNTER — Inpatient Hospital Stay (HOSPITAL_COMMUNITY): Payer: Medicare Other | Admitting: Hematology

## 2021-08-07 VITALS — BP 136/61 | HR 77 | Temp 98.4°F | Resp 20 | Wt 109.6 lb

## 2021-08-07 DIAGNOSIS — Z7952 Long term (current) use of systemic steroids: Secondary | ICD-10-CM | POA: Diagnosis not present

## 2021-08-07 DIAGNOSIS — Z9221 Personal history of antineoplastic chemotherapy: Secondary | ICD-10-CM | POA: Diagnosis not present

## 2021-08-07 DIAGNOSIS — Z171 Estrogen receptor negative status [ER-]: Secondary | ICD-10-CM

## 2021-08-07 DIAGNOSIS — E559 Vitamin D deficiency, unspecified: Secondary | ICD-10-CM | POA: Diagnosis not present

## 2021-08-07 DIAGNOSIS — C50912 Malignant neoplasm of unspecified site of left female breast: Secondary | ICD-10-CM

## 2021-08-07 DIAGNOSIS — Z9012 Acquired absence of left breast and nipple: Secondary | ICD-10-CM | POA: Diagnosis not present

## 2021-08-07 DIAGNOSIS — Z853 Personal history of malignant neoplasm of breast: Secondary | ICD-10-CM | POA: Diagnosis not present

## 2021-08-07 DIAGNOSIS — C3492 Malignant neoplasm of unspecified part of left bronchus or lung: Secondary | ICD-10-CM | POA: Diagnosis not present

## 2021-08-07 DIAGNOSIS — Z79899 Other long term (current) drug therapy: Secondary | ICD-10-CM | POA: Diagnosis not present

## 2021-08-07 DIAGNOSIS — Z7982 Long term (current) use of aspirin: Secondary | ICD-10-CM | POA: Diagnosis not present

## 2021-08-07 DIAGNOSIS — M810 Age-related osteoporosis without current pathological fracture: Secondary | ICD-10-CM | POA: Diagnosis not present

## 2021-08-07 DIAGNOSIS — Z923 Personal history of irradiation: Secondary | ICD-10-CM | POA: Diagnosis not present

## 2021-08-07 NOTE — Patient Instructions (Signed)
[place] at [facility] Discharge Instructions  You were seen and examined today by Dr. Delton Coombes.  Dr. Delton Coombes discussed your most recent lab work and CT scan which revealed that everything looks good.  Follow-up as scheduled in 4 months.    Thank you for choosing [place] at [facility] to provide your oncology and hematology care.  To afford each patient quality time with our provider, please arrive at least 15 minutes before your scheduled appointment time.   If you have a lab appointment with the [facility] please come in thru the Main Entrance and check in at the main information desk.  You need to re-schedule your appointment should you arrive 10 or more minutes late.  We strive to give you quality time with our providers, and arriving late affects you and other patients whose appointments are after yours.  Also, if you no show three or more times for appointments you may be dismissed from the clinic at the providers discretion.     Again, thank you for choosing [facility].  Our hope is that these requests will decrease the amount of time that you wait before being seen by our physicians.       _____________________________________________________________  Should you have questions after your visit to [facility], please contact our office at [phone] and follow the prompts.  Our office hours are 8:00 a.m. and 4:30 p.m. Monday - Friday.  Please note that voicemails left after 4:00 p.m. may not be returned until the following business day.  We are closed weekends and major holidays.  You do have access to a nurse 24-7, just call the main number to the clinic [phone] and do not press any options, hold on the line and a nurse will answer the phone.    For prescription refill requests, have your pharmacy contact our office and allow 72 hours.

## 2021-08-07 NOTE — Progress Notes (Signed)
[facility] [address], [place] [address]   Patient Care Team: Redmond School, MD as PCP - General (Internal Medicine) Danie Binder, MD (Inactive) as Consulting Physician (Gastroenterology) Eloise Harman, DO as Consulting Physician (Internal Medicine)  SUMMARY OF ONCOLOGIC HISTORY: Oncology History  Malignant neoplasm of left female breast [facility])  08/21/2016 Pathology Results   Left needle core biopsy, lower-inner quadrant: invasive ductal carcinoma and DCIS.  ER/PR-, HER2-.    01/16/2017 Surgery   Left simple mastectomy by Dr. Alean Rinne ductal carcinoma, grade 3, measuring 1.8 cm with DCIS (high-grade).  Lymphovascular invasion is identified.  Clear surgical margins.     CHIEF COMPLIANT: Follow-up for left breast cancer and lung cancer   INTERVAL HISTORY: [name] is a 88 y.o. female here today for follow up of her left breast cancer and lung cancer. Her last visit was on 11/27/2020.   Today she reports feeling fair. She reports cough productive of white sputum.   REVIEW OF SYSTEMS:   Review of Systems  Constitutional:  Positive for fatigue. Negative for appetite change.  Respiratory:  Positive for cough and shortness of breath.   Gastrointestinal:  Positive for diarrhea.  Neurological:  Positive for numbness (feet).  All other systems reviewed and are negative.   I have reviewed the past medical history, past surgical history, social history and family history with the patient and they are unchanged from previous note.   ALLERGIES:   is allergic to azithromycin, ivp dye [iodinated contrast media], cipro [ciprofloxacin hcl], gatifloxacin, and levaquin [levofloxacin].   MEDICATIONS:  Current Outpatient Medications  Medication Sig Dispense Refill   aspirin EC 81 MG tablet Take 81 mg by mouth daily.     atorvastatin (LIPITOR) 40 MG tablet Take 40 mg by mouth daily.     cetirizine (ZYRTEC) 5 MG tablet Take 1 tablet (5 mg total)  by mouth 2 (two) times daily for 10 days. 20 tablet 0   diltiazem (CARDIZEM CD) 180 MG 24 hr capsule Take 180 mg by mouth daily.     omeprazole (PRILOSEC) 40 MG capsule Take 1 capsule (40 mg total) by mouth daily. 90 capsule 3   predniSONE (DELTASONE) 50 MG tablet Take 1 tablet 13 hr prior to CT scan, 7 hrs prior and 1 hour prior 3 tablet 0   triamcinolone cream (KENALOG) 0.1 % Apply 1 Application topically 2 (two) times daily. 80 g 0   No current facility-administered medications for this visit.     PHYSICAL EXAMINATION: Performance status (ECOG): 1 - Symptomatic but completely ambulatory  There were no vitals filed for this visit. Wt Readings from Last 3 Encounters:  06/27/21 109 lb (49.4 kg)  06/04/21 110 lb (49.9 kg)  01/24/21 109 lb (49.4 kg)   Physical Exam Vitals reviewed.  Constitutional:      Appearance: Normal appearance.  Cardiovascular:     Rate and Rhythm: Normal rate and regular rhythm.     Pulses: Normal pulses.     Heart sounds: Normal heart sounds.  Pulmonary:     Effort: Pulmonary effort is normal.     Breath sounds: Normal breath sounds.  Neurological:     General: No focal deficit present.     Mental Status: She is alert and oriented to person, place, and time.  Psychiatric:        Mood and Affect: Mood normal.        Behavior: Behavior normal.     Breast Exam  Chaperone: Thana Ates     LABORATORY DATA:  I have reviewed the data as listed    Latest Ref Rng & Units 07/31/2021    2:25 PM 05/01/2021    3:03 PM 11/21/2020    1:39 PM  CMP  Glucose 70 - 99 mg/dL 161  109    BUN 8 - 23 mg/dL 11  13    Creatinine 0.44 - 1.00 mg/dL 0.85  0.74  0.90   Sodium 135 - 145 mmol/L 136  136    Potassium 3.5 - 5.1 mmol/L 3.8  4.3    Chloride 98 - 111 mmol/L 106  103    CO2 22 - 32 mmol/L 22  25    Calcium 8.9 - 10.3 mg/dL 9.5  9.6    Total Protein 6.5 - 8.1 g/dL 7.6  8.1    Total Bilirubin 0.3 - 1.2 mg/dL 0.6  0.3    Alkaline Phos 38 - 126 U/L 82  89     AST 15 - 41 U/L 21  19    ALT 0 - 44 U/L 19  15     No results found for: "CAN153" Lab Results  Component Value Date   WBC 6.7 07/31/2021   HGB 12.7 07/31/2021   HCT 38.9 07/31/2021   MCV 93.7 07/31/2021   PLT 283 07/31/2021   NEUTROABS 6.1 07/31/2021    ASSESSMENT:  1.  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 Arnoldo Morale on 01/16/2017 showing a 1.8 cm primary tumor, grade 3, LVI positive, clear surgical margins, with high-grade DCIS.  Lymph nodes cannot be removed due to prior history of left breast cancer treatment. -PET scan on 03/27/2017 showed mildly hypermetabolic small prevascular lymph node and right lower peritracheal lymph node indeterminate.  Moderate metabolic activity associated with ill defined left upper lobe nodule. -PET scan on 08/15/2017 showed mild reduction in size and activity of the left and left upper lobe pulmonary nodules, favoring benign etiology.  Stable small prevascular lymph nodes and reduced size and activity of the lower pretracheal lymph node.  Overall suggest reactive/inflammatory etiology. -Adjuvant chemotherapy was not recommended as more than 7 months have elapsed since her surgery. -She reports mild tenderness at the mastectomy site which is stable.  She also reports tightness at times. -She was given a referral to remove her Port-A-Cath at her last visit. -Last mammogram done on 09/02/2019 was BI-RADS Category 1 negative.     2.  History of node positive left breast cancer: -Diagnosed on 02/16/2001, T1 CN1M0, stage IIa, ER/PR positive, HER-2 negative, 1.5 cm, 1 out of 6 lymph nodes positive.   3.  Osteoporosis: -Last DEXA scan done on 09/02/2019 showed a T score of -3.4   4.  Vitamin D deficiency: -Labs done on 09/05/2019 showed vitamin D level 24.64   PLAN:  1.  Stage I (PT1CNX) left breast IDC, triple receptor negative: - Mammogram on 09/10/2020, BI-RADS Category 1. - She will have another mammogram  in August.   2.   PET positive left lung nodule: - SBRT to the left lung lesion from 04/28/2021 through 05/01/2021, 5 fractions, 60 Gray. - She reports feeling weak since the radiation. - Reviewed CT chest (07/31/2021): Interval decrease in size of lingular mass suggesting response to radiation.  Stable borderline enlarged mediastinal and hilar lymph nodes.  No new or progressive findings. - Recommend follow-up in 4 months with repeat CT of the chest.   3.  Osteoporosis: -  Continue calcium and vitamin D supplements.   4.  Vitamin D deficiency: - Continue vitamin D 2000 units daily.  Vitamin D level is normal.  Breast Cancer therapy associated bone loss: I have recommended calcium, Vitamin D and weight bearing exercises.  Orders placed this encounter:  No orders of the defined types were placed in this encounter.   The patient has a good understanding of the overall plan. She agrees with it. She will call with any problems that may develop before the next visit here.  Derek Jack, MD [facility] [phone]   I, Thana Ates, am acting as a scribe for Dr. Derek Jack.  I, Derek Jack MD, have reviewed the above documentation for accuracy and completeness, and I agree with the above.

## 2021-08-27 ENCOUNTER — Encounter: Payer: Self-pay | Admitting: *Deleted

## 2021-09-26 DIAGNOSIS — I10 Essential (primary) hypertension: Secondary | ICD-10-CM | POA: Diagnosis not present

## 2021-09-26 DIAGNOSIS — I2510 Atherosclerotic heart disease of native coronary artery without angina pectoris: Secondary | ICD-10-CM | POA: Diagnosis not present

## 2021-09-26 DIAGNOSIS — E785 Hyperlipidemia, unspecified: Secondary | ICD-10-CM | POA: Diagnosis not present

## 2021-09-26 DIAGNOSIS — F17290 Nicotine dependence, other tobacco product, uncomplicated: Secondary | ICD-10-CM | POA: Diagnosis not present

## 2021-09-30 DIAGNOSIS — E538 Deficiency of other specified B group vitamins: Secondary | ICD-10-CM | POA: Diagnosis not present

## 2021-10-08 DIAGNOSIS — M79674 Pain in right toe(s): Secondary | ICD-10-CM | POA: Diagnosis not present

## 2021-10-08 DIAGNOSIS — I739 Peripheral vascular disease, unspecified: Secondary | ICD-10-CM | POA: Diagnosis not present

## 2021-10-08 DIAGNOSIS — M79672 Pain in left foot: Secondary | ICD-10-CM | POA: Diagnosis not present

## 2021-10-08 DIAGNOSIS — L609 Nail disorder, unspecified: Secondary | ICD-10-CM | POA: Diagnosis not present

## 2021-10-08 DIAGNOSIS — M79675 Pain in left toe(s): Secondary | ICD-10-CM | POA: Diagnosis not present

## 2021-10-08 DIAGNOSIS — M79671 Pain in right foot: Secondary | ICD-10-CM | POA: Diagnosis not present

## 2021-11-06 DIAGNOSIS — E538 Deficiency of other specified B group vitamins: Secondary | ICD-10-CM | POA: Diagnosis not present

## 2021-11-29 DIAGNOSIS — E559 Vitamin D deficiency, unspecified: Secondary | ICD-10-CM | POA: Diagnosis not present

## 2021-11-29 DIAGNOSIS — E538 Deficiency of other specified B group vitamins: Secondary | ICD-10-CM | POA: Diagnosis not present

## 2021-11-29 DIAGNOSIS — Z0001 Encounter for general adult medical examination with abnormal findings: Secondary | ICD-10-CM | POA: Diagnosis not present

## 2021-11-29 DIAGNOSIS — E782 Mixed hyperlipidemia: Secondary | ICD-10-CM | POA: Diagnosis not present

## 2021-12-17 ENCOUNTER — Inpatient Hospital Stay: Payer: Medicare Other | Attending: Hematology

## 2021-12-17 ENCOUNTER — Other Ambulatory Visit (HOSPITAL_COMMUNITY): Payer: Self-pay | Admitting: Hematology

## 2021-12-17 ENCOUNTER — Ambulatory Visit (HOSPITAL_COMMUNITY): Admission: RE | Admit: 2021-12-17 | Payer: Medicare Other | Source: Ambulatory Visit

## 2021-12-17 DIAGNOSIS — E559 Vitamin D deficiency, unspecified: Secondary | ICD-10-CM | POA: Insufficient documentation

## 2021-12-17 DIAGNOSIS — R911 Solitary pulmonary nodule: Secondary | ICD-10-CM | POA: Diagnosis not present

## 2021-12-17 DIAGNOSIS — Z1231 Encounter for screening mammogram for malignant neoplasm of breast: Secondary | ICD-10-CM

## 2021-12-17 DIAGNOSIS — M810 Age-related osteoporosis without current pathological fracture: Secondary | ICD-10-CM | POA: Insufficient documentation

## 2021-12-17 DIAGNOSIS — Z853 Personal history of malignant neoplasm of breast: Secondary | ICD-10-CM | POA: Insufficient documentation

## 2021-12-17 DIAGNOSIS — C50912 Malignant neoplasm of unspecified site of left female breast: Secondary | ICD-10-CM

## 2021-12-17 LAB — COMPREHENSIVE METABOLIC PANEL
ALT: 21 U/L (ref 0–44)
AST: 24 U/L (ref 15–41)
Albumin: 4 g/dL (ref 3.5–5.0)
Alkaline Phosphatase: 92 U/L (ref 38–126)
Anion gap: 9 (ref 5–15)
BUN: 13 mg/dL (ref 8–23)
CO2: 28 mmol/L (ref 22–32)
Calcium: 9.4 mg/dL (ref 8.9–10.3)
Chloride: 103 mmol/L (ref 98–111)
Creatinine, Ser: 0.89 mg/dL (ref 0.44–1.00)
GFR, Estimated: >60 mL/min (ref >60)
Glucose, Bld: 104 mg/dL — ABNORMAL HIGH (ref 70–99)
Potassium: 3.6 mmol/L (ref 3.5–5.1)
Sodium: 140 mmol/L (ref 135–145)
Total Bilirubin: 0.2 mg/dL — ABNORMAL LOW (ref 0.3–1.2)
Total Protein: 8.1 g/dL (ref 6.5–8.1)

## 2021-12-17 LAB — CBC WITH DIFFERENTIAL/PLATELET
Abs Immature Granulocytes: 0.02 10*3/uL (ref 0.00–0.07)
Basophils Absolute: 0.1 10*3/uL (ref 0.0–0.1)
Basophils Relative: 1 %
Eosinophils Absolute: 0.1 10*3/uL (ref 0.0–0.5)
Eosinophils Relative: 1 %
HCT: 41.1 % (ref 36.0–46.0)
Hemoglobin: 13.2 g/dL (ref 12.0–15.0)
Immature Granulocytes: 0 %
Lymphocytes Relative: 26 %
Lymphs Abs: 2.2 10*3/uL (ref 0.7–4.0)
MCH: 30.6 pg (ref 26.0–34.0)
MCHC: 32.1 g/dL (ref 30.0–36.0)
MCV: 95.1 fL (ref 80.0–100.0)
Monocytes Absolute: 0.7 10*3/uL (ref 0.1–1.0)
Monocytes Relative: 8 %
Neutro Abs: 5.4 10*3/uL (ref 1.7–7.7)
Neutrophils Relative %: 64 %
Platelets: 283 10*3/uL (ref 150–400)
RBC: 4.32 MIL/uL (ref 3.87–5.11)
RDW: 13.2 % (ref 11.5–15.5)
WBC: 8.5 10*3/uL (ref 4.0–10.5)
nRBC: 0 % (ref 0.0–0.2)

## 2021-12-23 ENCOUNTER — Other Ambulatory Visit: Payer: Self-pay

## 2021-12-23 ENCOUNTER — Emergency Department (HOSPITAL_COMMUNITY)
Admission: EM | Admit: 2021-12-23 | Discharge: 2021-12-23 | Disposition: A | Discharge status: Home / Self Care | Payer: Medicare Other | Attending: Emergency Medicine | Admitting: Emergency Medicine

## 2021-12-23 ENCOUNTER — Emergency Department (HOSPITAL_COMMUNITY): Payer: Medicare Other

## 2021-12-23 ENCOUNTER — Ambulatory Visit (HOSPITAL_COMMUNITY)
Admission: RE | Admit: 2021-12-23 | Discharge: 2021-12-23 | Disposition: A | Discharge status: Home / Self Care | Payer: Medicare Other | Source: Ambulatory Visit | Attending: Hematology | Admitting: Hematology

## 2021-12-23 DIAGNOSIS — Z1231 Encounter for screening mammogram for malignant neoplasm of breast: Secondary | ICD-10-CM | POA: Insufficient documentation

## 2021-12-23 DIAGNOSIS — R053 Chronic cough: Secondary | ICD-10-CM | POA: Diagnosis not present

## 2021-12-23 DIAGNOSIS — Z5321 Procedure and treatment not carried out due to patient leaving prior to being seen by health care provider: Secondary | ICD-10-CM | POA: Insufficient documentation

## 2021-12-23 DIAGNOSIS — R0602 Shortness of breath: Secondary | ICD-10-CM | POA: Diagnosis not present

## 2021-12-23 MED ORDER — ALBUTEROL SULFATE HFA 108 (90 BASE) MCG/ACT IN AERS: 2.0000 | INHALATION_SPRAY | RESPIRATORY_TRACT | Status: DC | PRN

## 2021-12-23 NOTE — ED Triage Notes (Addendum)
Pt presents with ShOB that started this AM. Pt with hx of breast CA with possible met to the lung and CAD. Pt denies any CP, extremity swelling, or fever. Pt has a chronic cough and denies any changes to her cough. Pt able to speak in complete sentences between breaths.

## 2021-12-24 ENCOUNTER — Ambulatory Visit: Payer: Medicare Other | Admitting: Hematology

## 2021-12-26 DIAGNOSIS — F17290 Nicotine dependence, other tobacco product, uncomplicated: Secondary | ICD-10-CM | POA: Diagnosis not present

## 2021-12-26 DIAGNOSIS — I2510 Atherosclerotic heart disease of native coronary artery without angina pectoris: Secondary | ICD-10-CM | POA: Diagnosis not present

## 2021-12-26 DIAGNOSIS — I10 Essential (primary) hypertension: Secondary | ICD-10-CM | POA: Diagnosis not present

## 2021-12-26 DIAGNOSIS — E785 Hyperlipidemia, unspecified: Secondary | ICD-10-CM | POA: Diagnosis not present

## 2022-01-01 ENCOUNTER — Ambulatory Visit (INDEPENDENT_AMBULATORY_CARE_PROVIDER_SITE_OTHER): Payer: Medicare Other

## 2022-01-01 ENCOUNTER — Ambulatory Visit
Admission: EM | Admit: 2022-01-01 | Discharge: 2022-01-01 | Disposition: A | Discharge status: Home / Self Care | Payer: Medicare Other | Attending: Family Medicine | Admitting: Family Medicine

## 2022-01-01 DIAGNOSIS — R059 Cough, unspecified: Secondary | ICD-10-CM | POA: Diagnosis not present

## 2022-01-01 DIAGNOSIS — R0789 Other chest pain: Secondary | ICD-10-CM | POA: Diagnosis not present

## 2022-01-01 DIAGNOSIS — J439 Emphysema, unspecified: Secondary | ICD-10-CM | POA: Diagnosis not present

## 2022-01-01 MED ORDER — LIDOCAINE 5 % EX PTCH: 1.0000 | MEDICATED_PATCH | CUTANEOUS | 0 refills | Status: DC

## 2022-01-01 NOTE — ED Triage Notes (Signed)
Pt reports she has left breast pain where she had it removed and it radiates down to her lower left back x 5 days ago. She says its sore and makes her arm hurt as well. Took her daily tylenol but no relief.    Pain areas: left breast, underarm, left shoulder, and left back.    Pt does not know her med list to review.

## 2022-01-01 NOTE — Discharge Instructions (Signed)
Your EKG and chest x-ray were reassuring today with no new changes noted.  I suspect your pain is related either to your recent radiation treatments or to some pulled muscle type issue.  Continue taking Tylenol, using heat and I have sent over lidocaine patches.  Follow-up with your primary care provider and the specialist who does your radiation treatments.

## 2022-01-02 NOTE — ED Provider Notes (Addendum)
[facility]    CSN: [id number] Arrival date & time: 01/01/22  1407      History   Chief Complaint No chief complaint on file.   HPI Margaret Thompson is a 88 y.o. female.   Patient presenting today with left chest wall pain radiating toward the left shoulder and down to the left lateral flank that has been present for about 5 days now.  States it hurts at all times but pressure applied and movement of the arm seems to make it a bit worse.  She notes no palpitations, shortness of breath, dizziness, syncopal episodes, injury to the area, nausea, vomiting, diaphoresis, rashes or discoloration.  States she is status postmastectomy on this side for breast cancer and has recently undergone 5 radiation treatments for a mass to her lung in the exact area of her pain.  She does also have a history of MI, saw her cardiologist the day before symptoms started and got a good report.  So far trying Tylenol with temporary relief of her pain.    Past Medical History:  Diagnosis Date   Acute MI ([place]) 2003   Adenocarcinoma of left breast ([place]) 11/23/2012   Stage II (T1 C. N1 M0) adenocarcinoma of the left breast with surgery on 03/08/2001. She had a 1.5 cm cancer with 1/6 positive sentinel nodes. Reexcision margins were clear. She did participate in NSABP B.-30 randomized to Adriamycin and Taxotere for 4 cycles followed by radiation therapy from 07/13/2001 to 08/27/2001. She then started Arimidex on 07/07/2001 took that until the end of June 2008. T   Anemia    Bilateral cataracts    Breast cancer (HCC)    GERD (gastroesophageal reflux disease)    History of gout    History of radiation therapy    Left lung- 04/18/21-05/01/21- Dr. 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   Oral thrush 12/21/2019    Postprandial vomiting 09/21/2019   Abnormal LFTs 09/21/2019   Constipation 11/30/2018   Preventative health care 05/26/2018   Abdominal pain, epigastric 11/24/2017   Odynophagia 06/04/2017   GERD (gastroesophageal reflux disease) 06/04/2017   Malignant neoplasm of left female breast ([place])    Acute MI, inferoposterior wall ([place]) 12/21/2012   History of left breast cancer (2003) 11/23/2012   Bursitis, shoulder 05/25/2012    Past Surgical History:  Procedure Laterality Date   ABDOMINAL HYSTERECTOMY     BREAST SURGERY     CATARACT EXTRACTION W/PHACO Left 09/04/2014   Procedure: CATARACT EXTRACTION PHACO AND INTRAOCULAR LENS PLACEMENT ([place]);  Surgeon: Tonny Branch, MD;  Location: [facility];  Service: Ophthalmology;  Laterality: Left;  CDE:9.84   CATARACT EXTRACTION W/PHACO Right 11/09/2014   Procedure: CATARACT EXTRACTION PHACO AND INTRAOCULAR LENS PLACEMENT ([facility]);  Surgeon: Tonny Branch, MD;  Location: [facility];  Service: Ophthalmology;  Laterality: Right;  CDE:8.40   CORONARY ANGIOPLASTY     RCA stent   ESOPHAGOGASTRODUODENOSCOPY (EGD) WITH PROPOFOL N/A 08/04/2017   Fields: esophagitis, gastritis, no h.pylori. medium sized hiatal hernia, moderate schatzki ring s/p dilation   LEFT HEART CATHETERIZATION WITH CORONARY ANGIOGRAM N/A 12/21/2012   Procedure: LEFT HEART CATHETERIZATION WITH CORONARY ANGIOGRAM;  Surgeon: Clent Demark, MD;  Location: [facility] CATH LAB;  Service: Cardiovascular;  Laterality: N/A;   MASTECTOMY  MODIFIED RADICAL Left 01/16/2017   Procedure: SIMPLE MASTECTOMY;  Surgeon: Aviva Signs, MD;  Location: [facility];  Service: General;  Laterality: Left;   MASTECTOMY, PARTIAL Left    PERCUTANEOUS STENT INTERVENTION     RCA   PORT-A-CATH REMOVAL     PORT-A-CATH REMOVAL Right 09/13/2018   Procedure: REMOVAL PORT-A-CATH;  Surgeon: Aviva Signs, MD;  Location: [facility];  Service: General;  Laterality: Right;   PORTACATH PLACEMENT     PORTACATH PLACEMENT Right 03/11/2017   Procedure: INSERTION  PORT-A-CATH;  Surgeon: Aviva Signs, MD;  Location: [facility];  Service: General;  Laterality: Right;   SAVORY DILATION N/A 08/04/2017   Procedure: SAVORY DILATION;  Surgeon: Danie Binder, MD;  [facility];  Service: Endoscopy;  Laterality: N/A;    OB History   No obstetric history on file.      Home Medications    Prior to Admission medications   Medication Sig Start Date End Date Taking? Authorizing Provider  lidocaine (LIDODERM) 5 % Place 1 patch onto the skin daily. Remove & Discard patch within 12 hours or as directed by MD 01/01/22  Yes Volney American, PA-C  aspirin EC 81 MG tablet Take 81 mg by mouth daily.    [provider]  atorvastatin (LIPITOR) 40 MG tablet Take 40 mg by mouth daily. 11/26/16   [provider]  cetirizine (ZYRTEC) 5 MG tablet Take 1 tablet (5 mg total) by mouth 2 (two) times daily for 10 days. 07/12/21 07/22/21  Volney American, PA-C  diltiazem (CARDIZEM CD) 180 MG 24 hr capsule Take 180 mg by mouth daily. 03/09/20   [provider]  omeprazole (PRILOSEC) 40 MG capsule Take 1 capsule (40 mg total) by mouth daily. 06/27/21   Sherron Monday, NP  predniSONE (DELTASONE) 50 MG tablet Take 1 tablet 13 hr prior to CT scan, 7 hrs prior and 1 hour prior 07/30/21   Derek Jack, MD  triamcinolone cream (KENALOG) 0.1 % Apply 1 Application topically 2 (two) times daily. 07/12/21   Volney American, PA-C    Family History Family History  Problem Relation Age of Onset   Stroke Mother    Colon cancer Neg Hx    Gastric cancer Neg Hx    Esophageal cancer Neg Hx     Social History Social History   Tobacco Use   Smoking status: Every Day    Packs/day: 0.50    Years: 50.00    Total pack years: 25.00    Types: Cigarettes   Smokeless tobacco: Never   Tobacco comments:    10 cigarettes daily  Vaping Use   Vaping Use: Never used  Substance Use Topics   Alcohol use: No   Drug use: No      Allergies   Azithromycin, Ivp dye [iodinated contrast media], Cipro [ciprofloxacin hcl], Gatifloxacin, and Levaquin [levofloxacin]   Review of Systems Review of Systems Per HPI  Physical Exam Triage Vital Signs ED Triage Vitals  Enc Vitals Group     BP 01/01/22 1644 116/70     Pulse Rate 01/01/22 1644 (!) 112     Resp 01/01/22 1644 20     Temp 01/01/22 1644 98 F (36.7 C)     Temp Source 01/01/22 1644 Oral     SpO2 01/01/22 1644 97 %     Weight --      Height --      Head Circumference --      Peak Flow --  Pain Score 01/01/22 1649 8     Pain Loc --      Pain Edu? --      Excl. in [place]? --    No data found.  Updated Vital Signs BP 116/70 (BP Location: Right Arm)   Pulse (!) 112   Temp 98 F (36.7 C) (Oral)   Resp 20   SpO2 97%   Visual Acuity Right Eye Distance:   Left Eye Distance:   Bilateral Distance:    Right Eye Near:   Left Eye Near:    Bilateral Near:     Physical Exam Vitals and nursing note reviewed.  Constitutional:      Appearance: Normal appearance. She is not ill-appearing.  HENT:     Head: Atraumatic.     Mouth/Throat:     Mouth: Mucous membranes are moist.     Pharynx: Oropharynx is clear.  Eyes:     Extraocular Movements: Extraocular movements intact.     Conjunctiva/sclera: Conjunctivae normal.  Cardiovascular:     Rate and Rhythm: Normal rate and regular rhythm.     Heart sounds: Normal heart sounds.  Pulmonary:     Effort: Pulmonary effort is normal.     Breath sounds: Normal breath sounds. No wheezing or rales.  Abdominal:     General: Bowel sounds are normal. There is no distension.     Palpations: Abdomen is soft.     Tenderness: There is no abdominal tenderness.  Musculoskeletal:        General: Tenderness present. Normal range of motion.     Cervical back: Normal range of motion and neck supple.     Comments: Minimal tenderness to palpation over left chest wall into underarm region  Skin:    General: Skin is  warm and dry.     Findings: No erythema or rash.  Neurological:     Mental Status: She is alert and oriented to person, place, and time.  Psychiatric:        Mood and Affect: Mood normal.        Thought Content: Thought content normal.        Judgment: Judgment normal.      UC Treatments / Results  Labs (all labs ordered are listed, but only abnormal results are displayed) Labs Reviewed - No data to display  EKG   Radiology DG Chest 2 View  Result Date: 01/01/2022 CLINICAL DATA:  Productive cough EXAM: CHEST - 2 VIEW COMPARISON:  12/23/2021, CT 07/31/2021, PET CT 12/13/2020, 01/23/2020 FINDINGS: Emphysema and chronic interstitial opacity. Irregular lingular opacity similar compared to prior. Stable cardiomediastinal silhouette with aortic atherosclerosis. No pneumothorax. Dense mitral calcification. IMPRESSION: Emphysema and chronic interstitial opacity. No acute confluent airspace disease. Irregular lingular opacity similar compared to prior, may be due to residual lingular mass and scarring. Electronically Signed   By: Donavan Foil M.D.   On: 01/01/2022 17:36    Procedures Procedures (including critical care time)  Medications Ordered in UC Medications - No data to display  Initial Impression / Assessment and Plan / UC Course  I have reviewed the triage vital signs and the nursing notes.  Pertinent labs & imaging results that were available during my care of the patient were reviewed by me and considered in my medical decision making (see chart for details).     Chest x-ray today shows no acute cardiopulmonary abnormalities, EKG with no obvious changes from her recent EKG done in cardiology, normal sinus rhythm at 93 bpm with  no ST elevations.  Suspect either muscular nature or related to her recent radiation therapies.  Discussed to follow-up with PCP and specialists for further evaluation and in the meantime continue Tylenol, lidocaine patches for pain relief.  ED for  worsening symptoms at any time.  45 minutes spent today in direct patient care, coordination, education.  Final Clinical Impressions(s) / UC Diagnoses   Final diagnoses:  Left-sided chest wall pain     Discharge Instructions      Your EKG and chest x-ray were reassuring today with no new changes noted.  I suspect your pain is related either to your recent radiation treatments or to some pulled muscle type issue.  Continue taking Tylenol, using heat and I have sent over lidocaine patches.  Follow-up with your primary care provider and the specialist who does your radiation treatments.    ED Prescriptions     Medication Sig Dispense Auth. Provider   lidocaine (LIDODERM) 5 % Place 1 patch onto the skin daily. Remove & Discard patch within 12 hours or as directed by MD 1 patch Volney American, PA-C      PDMP not reviewed this encounter.   Volney American, [place] 01/02/22 1942    Volney American, [place] 01/02/22 1942

## 2022-02-01 ENCOUNTER — Ambulatory Visit (INDEPENDENT_AMBULATORY_CARE_PROVIDER_SITE_OTHER): Payer: Medicare Other

## 2022-02-01 ENCOUNTER — Ambulatory Visit
Admission: EM | Admit: 2022-02-01 | Discharge: 2022-02-01 | Disposition: A | Discharge status: Home / Self Care | Payer: Medicare Other | Attending: Emergency Medicine | Admitting: Emergency Medicine

## 2022-02-01 ENCOUNTER — Encounter: Payer: Self-pay | Admitting: Emergency Medicine

## 2022-02-01 DIAGNOSIS — M4316 Spondylolisthesis, lumbar region: Secondary | ICD-10-CM | POA: Diagnosis not present

## 2022-02-01 DIAGNOSIS — M545 Low back pain, unspecified: Secondary | ICD-10-CM

## 2022-02-01 DIAGNOSIS — R079 Chest pain, unspecified: Secondary | ICD-10-CM | POA: Diagnosis not present

## 2022-02-01 DIAGNOSIS — R0781 Pleurodynia: Secondary | ICD-10-CM

## 2022-02-01 DIAGNOSIS — R0789 Other chest pain: Secondary | ICD-10-CM

## 2022-02-01 MED ORDER — PREDNISONE 10 MG PO TABS: 20.0000 mg | ORAL_TABLET | Freq: Every day | ORAL | 0 refills | Status: AC

## 2022-02-01 NOTE — Discharge Instructions (Addendum)
Prednisone was prescribed/take as directed Continue to take Tylenol as prescribed for pain Follow-up with PCP Return to the ED if you develop any new or worsening of your symptoms

## 2022-02-01 NOTE — ED Triage Notes (Signed)
Lower back pain x 2 days.  No known injury.  States also has pain around rib area on both side.

## 2022-02-01 NOTE — ED Provider Notes (Signed)
[facility]   [id number] 02/01/22 Arrival Time: 1214   Chief Complaint  Patient presents with   Back Pain    Rib pain     SUBJECTIVE: History from: patient and family.  Margaret Thompson is a 89 y.o. female who presents to the urgent care with a  complaint of lower back pain  and bilateral rib cage pain for the past 2 days..  Denies any injury.  Has not tried any OTC medication for relief.  Abdomen made worse with ROM.  Denies no symptoms in the past   denies chills, fever, nausea, vomiting and diarrhea.   ROS: As per HPI.  All other pertinent ROS negative.      Past Medical History:  Diagnosis Date   Acute MI (HCC) 2003   Adenocarcinoma of left breast (HCC) 11/23/2012   Stage II (T1 C. N1 M0) adenocarcinoma of the left breast with surgery on 03/08/2001. She had a 1.5 cm cancer with 1/6 positive sentinel nodes. Reexcision margins were clear. She did participate in NSABP B.-30 randomized to Adriamycin and Taxotere for 4 cycles followed by radiation therapy from 07/13/2001 to 08/27/2001. She then started Arimidex on 07/07/2001 took that until the end of June 2008. T   Anemia    Bilateral cataracts    Breast cancer (HCC)    GERD (gastroesophageal reflux disease)    History of gout    History of radiation therapy    Left lung- 04/18/21-05/01/21- Dr. Antony Blackbird   Hypercholesteremia    Hypertension    Malignant neoplasm of lower-inner quadrant of left female breast [facility])    Osteoporosis    Past Surgical History:  Procedure Laterality Date   ABDOMINAL HYSTERECTOMY     BREAST SURGERY     CATARACT EXTRACTION W/PHACO Left 09/04/2014   Procedure: CATARACT EXTRACTION PHACO AND INTRAOCULAR LENS PLACEMENT ([facility]);  Surgeon: Gemma Payor, MD;  Location: [facility];  Service: Ophthalmology;  Laterality: Left;  CDE:9.84   CATARACT EXTRACTION W/PHACO Right 11/09/2014   Procedure: CATARACT EXTRACTION PHACO AND INTRAOCULAR LENS PLACEMENT (IOC);  Surgeon: Gemma Payor, MD;  Location: [facility];   Service: Ophthalmology;  Laterality: Right;  CDE:8.40   CORONARY ANGIOPLASTY     RCA stent   ESOPHAGOGASTRODUODENOSCOPY (EGD) WITH PROPOFOL N/A 08/04/2017   Fields: esophagitis, gastritis, no h.pylori. medium sized hiatal hernia, moderate schatzki ring s/p dilation   LEFT HEART CATHETERIZATION WITH CORONARY ANGIOGRAM N/A 12/21/2012   Procedure: LEFT HEART CATHETERIZATION WITH CORONARY ANGIOGRAM;  Surgeon: Robynn Pane, MD;  [facility];  Service: Cardiovascular;  Laterality: N/A;   MASTECTOMY MODIFIED RADICAL Left 01/16/2017   Procedure: SIMPLE MASTECTOMY;  Surgeon: Franky Macho, MD;  Location: [facility];  Service: General;  Laterality: Left;   MASTECTOMY, PARTIAL Left    PERCUTANEOUS STENT INTERVENTION     RCA   PORT-A-CATH REMOVAL     PORT-A-CATH REMOVAL Right 09/13/2018   Procedure: REMOVAL PORT-A-CATH;  Surgeon: Franky Macho, MD;  Location: [facility];  Service: General;  Laterality: Right;   PORTACATH PLACEMENT     PORTACATH PLACEMENT Right 03/11/2017   Procedure: INSERTION PORT-A-CATH;  Surgeon: Franky Macho, MD;  Location: [facility];  Service: General;  Laterality: Right;   SAVORY DILATION N/A 08/04/2017   Procedure: SAVORY DILATION;  Surgeon: [place] Bali, MD;  Location[facility];  Service: Endoscopy;  Laterality: N/A;   Allergies  Allergen Reactions   Azithromycin Anaphylaxis and Hives   Ivp Dye [Iodinated Contrast Media] Hives  CT    Cipro [Ciprofloxacin Hcl]    Gatifloxacin    Levaquin [Levofloxacin]    No current facility-administered medications on file prior to encounter.   Current Outpatient Medications on File Prior to Encounter  Medication Sig Dispense Refill   aspirin EC 81 MG tablet Take 81 mg by mouth daily.     atorvastatin (LIPITOR) 40 MG tablet Take 40 mg by mouth daily.     cetirizine (ZYRTEC) 5 MG tablet Take 1 tablet (5 mg total) by mouth 2 (two) times daily for 10 days. 20 tablet 0   diltiazem (CARDIZEM CD) 180 MG 24 hr capsule Take 180 mg by  mouth daily.     lidocaine (LIDODERM) 5 % Place 1 patch onto the skin daily. Remove & Discard patch within 12 hours or as directed by MD 30 patch 0   omeprazole (PRILOSEC) 40 MG capsule Take 1 capsule (40 mg total) by mouth daily. 90 capsule 3   triamcinolone cream (KENALOG) 0.1 % Apply 1 Application topically 2 (two) times daily. 80 g 0   Social History   Socioeconomic History   Marital status: Widowed    Spouse name: Not on file   Number of children: Not on file   Years of education: Not on file   Highest education level: Not on file  Occupational History   Not on file  Tobacco Use   Smoking status: Every Day    Packs/day: 0.50    Years: 50.00    Total pack years: 25.00    Types: Cigarettes   Smokeless tobacco: Never   Tobacco comments:    10 cigarettes daily  Vaping Use   Vaping Use: Never used  Substance and Sexual Activity   Alcohol use: No   Drug use: No   Sexual activity: Not Currently    Birth control/protection: Surgical  Other Topics Concern   Not on file  Social History Narrative   Not on file   Social Determinants of Health   Financial Resource Strain: Not on file  Food Insecurity: Not on file  Transportation Needs: Not on file  Physical Activity: Not on file  Stress: Not on file  Social Connections: Not on file  Intimate Partner Violence: Not on file   Family History  Problem Relation Age of Onset   Stroke Mother    Colon cancer Neg Hx    Gastric cancer Neg Hx    Esophageal cancer Neg Hx     OBJECTIVE:  Vitals:   02/01/22 1336  BP: 128/74  Pulse: 97  Resp: 20  Temp: 97.9 F (36.6 C)  TempSrc: Oral  SpO2: 94%     Physical Exam Vitals and nursing note reviewed.  Constitutional:      General: She is not in acute distress.    Appearance: Normal appearance. She is normal weight. She is not ill-appearing, toxic-appearing or diaphoretic.  HENT:     Head: Normocephalic.  Cardiovascular:     Rate and Rhythm: Normal rate and regular  rhythm.     Pulses: Normal pulses.     Heart sounds: Normal heart sounds. No murmur heard.    No friction rub. No gallop.  Pulmonary:     Effort: Pulmonary effort is normal. No respiratory distress.     Breath sounds: Normal breath sounds. No stridor. No wheezing, rhonchi or rales.  Chest:     Chest wall: No tenderness.  Musculoskeletal:     Lumbar back: Tenderness present.     Comments: Bony  tenderness around rib cage.  Neurological:     Mental Status: She is alert and oriented to person, place, and time.      LABS:  No results found for this or any previous visit (from the past 24 hour(s)).   RADIOLOGY  DG Chest 2 View  Result Date: 02/01/2022 CLINICAL DATA:  Rib cage pain, chest pain EXAM: CHEST - 2 VIEW COMPARISON:  01/01/2022 FINDINGS: Hyperinflation. Chronic scarring in the lingula and left base. This is unchanged. Heart is normal size. Right lung clear. No effusions or pneumothorax. No visible displaced rib fracture. IMPRESSION: Hyperinflation/chronic changes. No active disease. Electronically Signed   By: Charlett Nose M.D.   On: 02/01/2022 14:20   DG Lumbar Spine Complete  Result Date: 02/01/2022 CLINICAL DATA:  Low back pain EXAM: LUMBAR SPINE - COMPLETE 4+ VIEW COMPARISON:  None Available. FINDINGS: Diffuse degenerative disc and facet disease. Slight retrolisthesis of L2 on L3 and L3 on L4 related to facet disease. No fracture. SI joints symmetric and unremarkable. Aortic atherosclerosis. IMPRESSION: Diffuse degenerative disc and facet disease. No acute bony abnormality. Electronically Signed   By: Charlett Nose M.D.   On: 02/01/2022 14:19     Lower back and chest X-ray are negative for bony abnormality including fracture or dislocation.  I have reviewed the x-ray myself and the radiologist interpretation.  I am in agreement with the radiologist interpretation.   ASSESSMENT & PLAN:  1. Acute bilateral low back pain without sciatica   2. Painful rib     Meds ordered  this encounter  Medications   predniSONE (DELTASONE) 10 MG tablet    Sig: Take 2 tablets (20 mg total) by mouth daily for 5 days.    Dispense:  10 tablet    Refill:  0   Discharge instructions  Prednisone as prescribed/take as directed Continue to take Tylenol as prescribed for pain Follow-up with PCP Return to the ED if you develop any new or worsening of your symptoms  Reviewed expectations re: course of current medical issues. Questions answered. Outlined signs and symptoms indicating need for more acute intervention. Patient verbalized understanding. After Visit Summary given.          Durward Parcel, FNP 02/01/22 [phone]

## 2022-02-10 ENCOUNTER — Ambulatory Visit (HOSPITAL_COMMUNITY)
Admission: RE | Admit: 2022-02-10 | Discharge: 2022-02-10 | Disposition: A | Discharge status: Home / Self Care | Payer: Medicare Other | Source: Ambulatory Visit | Attending: Hematology | Admitting: Hematology

## 2022-02-10 DIAGNOSIS — R918 Other nonspecific abnormal finding of lung field: Secondary | ICD-10-CM | POA: Diagnosis not present

## 2022-02-10 DIAGNOSIS — C50912 Malignant neoplasm of unspecified site of left female breast: Secondary | ICD-10-CM | POA: Diagnosis not present

## 2022-02-10 DIAGNOSIS — Z171 Estrogen receptor negative status [ER-]: Secondary | ICD-10-CM | POA: Diagnosis not present

## 2022-02-10 DIAGNOSIS — J439 Emphysema, unspecified: Secondary | ICD-10-CM | POA: Diagnosis not present

## 2022-02-10 MED ORDER — IOHEXOL 300 MG/ML  SOLN
75.0000 mL | Freq: Once | INTRAMUSCULAR | Status: AC | PRN
  Administered 2022-02-10: 75 mL via INTRAVENOUS

## 2022-02-11 ENCOUNTER — Telehealth: Payer: Self-pay | Admitting: *Deleted

## 2022-02-11 NOTE — Telephone Encounter (Signed)
Patient states that she had CT yesterday and premedicated as instructed.  She woke up this am and has begun stumbling and has fallen several times.  "Just doesn't feel good".  Also described pink skin pigmentation from mid thigh to flank.  Advised that she should be seen in the ER, however declined.  Stated that she has one prednisone left and will take that and if if does not improve, she will see if she can get to the ER.

## 2022-02-17 ENCOUNTER — Inpatient Hospital Stay: Payer: Medicare Other | Attending: Hematology | Admitting: Hematology

## 2022-02-17 VITALS — BP 127/60 | HR 82 | Temp 98.1°F | Resp 18 | Ht 62.0 in | Wt 114.3 lb

## 2022-02-17 DIAGNOSIS — Z923 Personal history of irradiation: Secondary | ICD-10-CM | POA: Diagnosis not present

## 2022-02-17 DIAGNOSIS — Z9012 Acquired absence of left breast and nipple: Secondary | ICD-10-CM | POA: Insufficient documentation

## 2022-02-17 DIAGNOSIS — M810 Age-related osteoporosis without current pathological fracture: Secondary | ICD-10-CM | POA: Diagnosis not present

## 2022-02-17 DIAGNOSIS — Z171 Estrogen receptor negative status [ER-]: Secondary | ICD-10-CM | POA: Diagnosis not present

## 2022-02-17 DIAGNOSIS — Z853 Personal history of malignant neoplasm of breast: Secondary | ICD-10-CM | POA: Diagnosis not present

## 2022-02-17 DIAGNOSIS — R911 Solitary pulmonary nodule: Secondary | ICD-10-CM | POA: Insufficient documentation

## 2022-02-17 DIAGNOSIS — R918 Other nonspecific abnormal finding of lung field: Secondary | ICD-10-CM | POA: Diagnosis not present

## 2022-02-17 DIAGNOSIS — E559 Vitamin D deficiency, unspecified: Secondary | ICD-10-CM | POA: Diagnosis not present

## 2022-02-17 DIAGNOSIS — C50912 Malignant neoplasm of unspecified site of left female breast: Secondary | ICD-10-CM | POA: Diagnosis not present

## 2022-02-17 DIAGNOSIS — Z79899 Other long term (current) drug therapy: Secondary | ICD-10-CM | POA: Diagnosis not present

## 2022-02-17 DIAGNOSIS — Z7982 Long term (current) use of aspirin: Secondary | ICD-10-CM | POA: Diagnosis not present

## 2022-02-17 MED ORDER — PREDNISONE 50 MG PO TABS: ORAL_TABLET | ORAL | 0 refills | Status: DC

## 2022-02-17 MED ORDER — GABAPENTIN 100 MG PO CAPS: 300.0000 mg | ORAL_CAPSULE | Freq: Every day | ORAL | 3 refills | Status: DC

## 2022-02-17 NOTE — Patient Instructions (Signed)
[facility]  Discharge Instructions  You were seen and examined today by Dr. Delton Coombes.  Dr. Delton Coombes discussed your most recent lab work and CT scan which revealed that everything looks good.  Follow-up as scheduled in 6 months with labs and CT chest prior.    Thank you for choosing [facility] to provide your oncology and hematology care.   To afford each patient quality time with our provider, please arrive at least 15 minutes before your scheduled appointment time. You may need to reschedule your appointment if you arrive late (10 or more minutes). Arriving late affects you and other patients whose appointments are after yours.  Also, if you miss three or more appointments without notifying the office, you may be dismissed from the clinic at the provider's discretion.    Again, thank you for choosing [facility].  Our hope is that these requests will decrease the amount of time that you wait before being seen by our physicians.   If you have a lab appointment with the [facility] please come in thru the Main Entrance and check in at the main information desk.           _____________________________________________________________  Should you have questions after your visit to [facility], please contact our office at [phone] and follow the prompts.  Our office hours are 8:00 a.m. to 4:30 p.m. Monday - Thursday and 8:00 a.m. to 2:30 p.m. Friday.  Please note that voicemails left after 4:00 p.m. may not be returned until the following business day.  We are closed weekends and all major holidays.  You do have access to a nurse 24-7, just call the main number to the clinic [phone] and do not press any options, hold on the line and a nurse will answer the phone.    For prescription refill requests, have your pharmacy contact our office and allow 72 hours.    Masks are optional in the cancer  centers. If you would like for your care team to wear a mask while they are taking care of you, please let them know. You may have one support person who is at least 87 years old accompany you for your appointments.

## 2022-02-17 NOTE — Progress Notes (Signed)
[facility] [address] [place] [address]   Patient Care Team: Redmond School, MD as PCP - General (Internal Medicine) Danie Binder, MD (Inactive) as Consulting Physician (Gastroenterology) Eloise Harman, DO as Consulting Physician (Internal Medicine)  SUMMARY OF ONCOLOGIC HISTORY: Oncology History  Malignant neoplasm of left female breast [facility])  08/21/2016 Pathology Results   Left needle core biopsy, lower-inner quadrant: invasive ductal carcinoma and DCIS.  ER/PR-, HER2-.    01/16/2017 Surgery   Left simple mastectomy by Dr. Alean Rinne ductal carcinoma, grade 3, measuring 1.8 cm with DCIS (high-grade).  Lymphovascular invasion is identified.  Clear surgical margins.     CHIEF COMPLIANT: Follow-up for left breast cancer and lung cancer   INTERVAL HISTORY: [name] is a 89 y.o. female here today for follow up of her left breast cancer and lung cancer. She was last seen by me on 08/07/21.  Today, she states that she is doing fair. She reports 7/10 left sided pain for quite a while. She states that her pain radiates from her left lateral chest wall to her left axilla and mid back. Patient has been taking Tylenol 500mg  daily with some relief. Her pain worsens throughout the day. At nighttime she feels a grabbing pain and tightness in her left lateral chest and axilla area. She went to urgent care for her pain on 02/01/22. Her chest xray and L-spine xray showed no acute findings. She completed radiation in 04/2021 and reports mild pain since that time. She also recounts having a left shoulder fracture x2 years ago. Her appetite level is at 50%. Her energy level is at 0%.  She had her last mammogram on 12/23/21 which was negative for evidence of malignancy.  She is still taking Calcium but is not taking any Vitamin D supplementation.   REVIEW OF SYSTEMS:   Review of Systems  Constitutional:  Positive for fatigue. Negative for chills and fever.   HENT:   Negative for lump/mass, mouth sores, nosebleeds, sore throat and trouble swallowing.   Eyes:  Negative for eye problems.  Respiratory:  Positive for cough and shortness of breath.   Cardiovascular:  Negative for chest pain, leg swelling and palpitations.  Gastrointestinal:  Negative for abdominal pain, constipation, diarrhea, nausea and vomiting.  Genitourinary:  Negative for bladder incontinence, difficulty urinating, dysuria, frequency, hematuria and nocturia.   Musculoskeletal:  Positive for back pain and myalgias. Negative for arthralgias, flank pain and neck pain.  Skin:  Negative for itching and rash.  Neurological:  Positive for dizziness. Negative for headaches and numbness.  Hematological:  Does not bruise/bleed easily.  Psychiatric/Behavioral:  Negative for depression, sleep disturbance and suicidal ideas. The patient is not nervous/anxious.   All other systems reviewed and are negative.   I have reviewed the past medical history, past surgical history, social history and family history with the patient and they are unchanged from previous note.   ALLERGIES:   is allergic to azithromycin, ivp dye [iodinated contrast media], cipro [ciprofloxacin hcl], gatifloxacin, and levaquin [levofloxacin].   MEDICATIONS:  Current Outpatient Medications  Medication Sig Dispense Refill   aspirin EC 81 MG tablet Take 81 mg by mouth daily.     atorvastatin (LIPITOR) 40 MG tablet Take 40 mg by mouth daily.     diltiazem (CARDIZEM CD) 180 MG 24 hr capsule Take 180 mg by mouth daily.     gabapentin (NEURONTIN) 100 MG capsule Take 3 capsules (300 mg total) by mouth at  bedtime. 30 capsule 3   No current facility-administered medications for this visit.     PHYSICAL EXAMINATION: Performance status (ECOG): 1 - Symptomatic but completely ambulatory  Vitals:   02/17/22 1504  BP: 127/60  Pulse: 82  Resp: 18  Temp: 98.1 F (36.7 C)  SpO2: 98%   Wt Readings from Last 3 Encounters:   02/17/22 51.8 kg (114 lb 4.8 oz)  12/23/21 47.2 kg (104 lb)  08/07/21 49.7 kg (109 lb 9.1 oz)   Physical Exam Vitals and nursing note reviewed. Exam conducted with a chaperone present.  Constitutional:      Appearance: Normal appearance.  Cardiovascular:     Rate and Rhythm: Normal rate and regular rhythm.     Pulses: Normal pulses.     Heart sounds: Normal heart sounds.  Pulmonary:     Effort: Pulmonary effort is normal.     Breath sounds: Normal breath sounds.  Chest:     Comments: Tenderness along left posterior ribs, Left mastectomy site WNL no palpable masses, Right breast with no palpable masses, no palpable lymphadenopathy in axilla bilaterally Abdominal:     Palpations: Abdomen is soft. There is no hepatomegaly, splenomegaly or mass.     Tenderness: There is no abdominal tenderness.  Lymphadenopathy:     Upper Body:     Right upper body: No supraclavicular, axillary or pectoral adenopathy.     Left upper body: No supraclavicular, axillary or pectoral adenopathy.  Neurological:     General: No focal deficit present.     Mental Status: She is alert and oriented to person, place, and time.  Psychiatric:        Mood and Affect: Mood normal.        Behavior: Behavior normal.    Breast Exam Chaperone: Chasity Edwards, LPN    LABORATORY DATA:  I have reviewed the data as listed    Latest Ref Rng & Units 12/17/2021   12:29 PM 07/31/2021    2:25 PM 05/01/2021    3:03 PM  CMP  Glucose 70 - 99 mg/dL 104  161  109   BUN 8 - 23 mg/dL 13  11  13    Creatinine 0.44 - 1.00 mg/dL 0.89  0.85  0.74   Sodium 135 - 145 mmol/L 140  136  136   Potassium 3.5 - 5.1 mmol/L 3.6  3.8  4.3   Chloride 98 - 111 mmol/L 103  106  103   CO2 22 - 32 mmol/L 28  22  25    Calcium 8.9 - 10.3 mg/dL 9.4  9.5  9.6   Total Protein 6.5 - 8.1 g/dL 8.1  7.6  8.1   Total Bilirubin 0.3 - 1.2 mg/dL 0.2  0.6  0.3   Alkaline Phos 38 - 126 U/L 92  82  89   AST 15 - 41 U/L 24  21  19    ALT 0 - 44 U/L 21  19   15     No results found for: "CAN153" Lab Results  Component Value Date   WBC 8.5 12/17/2021   HGB 13.2 12/17/2021   HCT 41.1 12/17/2021   MCV 95.1 12/17/2021   PLT 283 12/17/2021   NEUTROABS 5.4 12/17/2021   RADIOGRAPHIC STUDIES: I have personally reviewed the radiological images as listed and agreed with the findings in the report. CT Chest W Contrast  Result Date: 02/11/2022 CLINICAL DATA:  History of breast cancer. Follow-up lung nodules. * Tracking Code: BO * . EXAM: CT CHEST  WITH CONTRAST TECHNIQUE: Multidetector CT imaging of the chest was performed during intravenous contrast administration. RADIATION DOSE REDUCTION: This exam was performed according to the departmental dose-optimization program which includes automated exposure control, adjustment of the mA and/or kV according to patient size and/or use of iterative reconstruction technique. CONTRAST:  42mL OMNIPAQUE IOHEXOL 300 MG/ML  SOLN COMPARISON:  07/31/2021 FINDINGS: Cardiovascular: Heart size normal. No pericardial effusion. Aortic atherosclerosis and 3 vessel coronary artery calcifications. Calcifications of the mitral valve. Mediastinum/Nodes: Thyroid gland and trachea are unremarkable. Circumferential wall thickening of the distal esophagus noted. No enlarged axillary lymph nodes. No mediastinal or hilar adenopathy. Lungs/Pleura: No pleural fluid. No airspace consolidation. Moderate changes of emphysema. Progressive architectural distortion, fibrosis and scarring within the lingula and left lower lobe consistent with changes secondary to external beam radiation. Treated tumor within the lingula measures 1.4 x 1.3 by 0.7 cm (volume = 0.7 cm^3), image 78/4 and image 59/5. Previously this measured 2.2 x 1.7 x 1.2 cm (volume = 2.3 cm^3). No new sites of disease identified. Upper Abdomen: No acute findings. Small hiatal hernia. Cyst within the spleen is stable measuring 1 cm. Musculoskeletal: No acute or suspicious osseous findings. No  remote deformity involving the proximal left humerus. Bones appear osteopenic with mild thoracic kyphosis. Unchanged from previous exam. Status post left mastectomy. IMPRESSION: 1. Interval decrease in size of treated tumor within the lingula. 2. Progressive architectural distortion, fibrosis and scarring within the lingula and left lower lobe consistent with changes secondary to external beam radiation. 3. No new sites of disease identified. 4. Coronary artery calcifications. 5. Circumferential wall thickening of the distal esophagus noted. Correlate for any clinical signs or symptoms of esophagitis. 6. Hiatal hernia. 7.  Aortic Atherosclerosis (ICD10-I70.0). Electronically Signed   By: Kerby Moors M.D.   On: 02/11/2022 12:43   DG Chest 2 View  Result Date: 02/01/2022 CLINICAL DATA:  Rib cage pain, chest pain EXAM: CHEST - 2 VIEW COMPARISON:  01/01/2022 FINDINGS: Hyperinflation. Chronic scarring in the lingula and left base. This is unchanged. Heart is normal size. Right lung clear. No effusions or pneumothorax. No visible displaced rib fracture. IMPRESSION: Hyperinflation/chronic changes. No active disease. Electronically Signed   By: Rolm Baptise M.D.   On: 02/01/2022 14:20   DG Lumbar Spine Complete  Result Date: 02/01/2022 CLINICAL DATA:  Low back pain EXAM: LUMBAR SPINE - COMPLETE 4+ VIEW COMPARISON:  None Available. FINDINGS: Diffuse degenerative disc and facet disease. Slight retrolisthesis of L2 on L3 and L3 on L4 related to facet disease. No fracture. SI joints symmetric and unremarkable. Aortic atherosclerosis. IMPRESSION: Diffuse degenerative disc and facet disease. No acute bony abnormality. Electronically Signed   By: Rolm Baptise M.D.   On: 02/01/2022 14:19      ASSESSMENT:  1.  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 Arnoldo Morale on 01/16/2017 showing a 1.8 cm primary tumor, grade 3, LVI positive, clear surgical margins, with high-grade  DCIS.  Lymph nodes cannot be removed due to prior history of left breast cancer treatment. -PET scan on 03/27/2017 showed mildly hypermetabolic small prevascular lymph node and right lower peritracheal lymph node indeterminate.  Moderate metabolic activity associated with ill defined left upper lobe nodule. -PET scan on 08/15/2017 showed mild reduction in size and activity of the left and left upper lobe pulmonary nodules, favoring benign etiology.  Stable small prevascular lymph nodes and reduced size and activity of the lower pretracheal lymph node.  Overall suggest  reactive/inflammatory etiology. -Adjuvant chemotherapy was not recommended as more than 7 months have elapsed since her surgery. -She reports mild tenderness at the mastectomy site which is stable.  She also reports tightness at times. -She was given a referral to remove her Port-A-Cath at her last visit. -Last mammogram done on 09/02/2019 was BI-RADS Category 1 negative.     2.  History of node positive left breast cancer: -Diagnosed on 02/16/2001, T1 CN1M0, stage IIa, ER/PR positive, HER-2 negative, 1.5 cm, 1 out of 6 lymph nodes positive.   3.  Osteoporosis: -Last DEXA scan done on 09/02/2019 showed a T score of -3.4   4.  Vitamin D deficiency: -Labs done on 09/05/2019 showed vitamin D level 24.64   PLAN:  1.  Stage I (PT1CNX) left breast IDC, triple receptor negative: - Left mastectomy site is within normal limits.  Right breast has no palpable masses.  No palpable adenopathy. - Mammogram from 02/23/2021: BI-RADS Category 1.  Continue yearly mammograms.   2.   PET positive left lung nodule: - SBRT to the left lung lesion from 04/28/2021 through 05/01/2021, 5 fractions, 60 Gray. - Reviewed CT chest (02/10/2022): Decrease in size of the treated tumor.  Progressive architectural distortion, fibrosis and scarring within the lingula and left lower lobe consistent with changes secondary to radiation.  No new sites of disease seen. - She  complains of pain in the left lateral chest wall, likely from combination of surgery and radiation.  She has tender points on the left chest wall. - Labs showed normal LFTs.  No suspicious bone lesions seen on the CT scan.  Hence I did not recommend bone scan. - Recommend follow-up in 6 months with repeat CT of the chest.   3.  Osteoporosis: - Continue calcium supplements.   4.  Vitamin D deficiency: - She is not taking vitamin D supplements.  She had a history of vitamin D deficiency.  Will check vitamin D level at next visit.  Breast Cancer therapy associated bone loss: I have recommended calcium, Vitamin D and weight bearing exercises.  Orders placed this encounter:  No orders of the defined types were placed in this encounter.   The patient has a good understanding of the overall plan. She agrees with it. She will call with any problems that may develop before the next visit here.   I,Alexis Herring,acting as a Education administrator for [employer], MD.,have documented all relevant documentation on the behalf of Derek Jack, MD,as directed by  Derek Jack, MD while in the presence of Derek Jack, MD.  I, Derek Jack MD, have reviewed the above documentation for accuracy and completeness, and I agree with the above.   Derek Jack, MD [place] [phone]

## 2022-04-15 ENCOUNTER — Encounter: Payer: Self-pay | Admitting: *Deleted

## 2022-04-15 ENCOUNTER — Ambulatory Visit: Payer: Self-pay | Admitting: *Deleted

## 2022-04-15 NOTE — Patient Instructions (Signed)
Visit Information  Thank you for taking time to visit with me today. Please don't hesitate to contact me if I can be of assistance to you.   Please call the care guide team at [phone] if you need to cancel or reschedule your appointment.   If you are experiencing a Mental Health or Behavioral Health Crisis or need someone to talk to, please call the Suicide and Crisis Lifeline: 988 call the [place] National Suicide Prevention Lifeline: 1-800-273-8255 or TTY: 1-800-799-4 TTY (1-800-799-4889) to talk to a trained counselor call 1-800-273-TALK (toll free, 24 hour hotline) go to [facility] [address], [place] ([phone]) call the [place] Crisis Line: 800-939-9988 call 911  Patient verbalizes understanding of instructions and care plan provided today and agrees to view in MyChart. Active MyChart status and patient understanding of how to access instructions and care plan via MyChart confirmed with patient.     No further follow up required.  Avayah Raffety, BSW, MSW, LCSW  Licensed Clinical Social Worker  [facility]  Mailing Address-[address], [place], [place] [address] Physical Address-[address], [place], [place] [address] Toll Free Main # [phone] Fax # [phone] Cell # [phone] [email or web address]            

## 2022-04-15 NOTE — Patient Outreach (Signed)
  Care Coordination   Initial Visit Note   04/15/2022  Name: Margaret Thompson MRN: [id number] DOB: [birth year]  Margaret Thompson is a 89 y.o. year old female who sees Redmond School, MD for primary care. I spoke with Miguel Aschoff by phone today.  What matters to the patients health and wellness today?  No interventions identified.  Patient denies need for social work involvement at this time.  SDOH assessments and interventions completed:  Yes.  SDOH Interventions Today    Flowsheet Row Most Recent Value  SDOH Interventions   Food Insecurity Interventions Intervention Not Indicated  Housing Interventions Intervention Not Indicated  Transportation Interventions Intervention Not Indicated, Patient Resources (Friends/Family), Payor Benefit  Utilities Interventions Intervention Not Indicated  Alcohol Usage Interventions Intervention Not Indicated (Score <7)  Financial Strain Interventions Intervention Not Indicated  Physical Activity Interventions Intervention Not Indicated  Stress Interventions Intervention Not Indicated  Social Connections Interventions Intervention Not Indicated     Care Coordination Interventions:  No, not indicated.   Follow up plan: No further intervention required.   Encounter Outcome:  Pt. Visit Completed.   Nat Christen, BSW, MSW, LCSW  Licensed Education officer, [facility]  Mailing [place] [address], [place], [place] [address] Physical Address-[address], [place], [place] [address] Toll Free Main # [phone] Fax # [phone] Cell # [phone] [email or web address]

## 2022-05-21 ENCOUNTER — Encounter: Payer: Self-pay | Admitting: Emergency Medicine

## 2022-05-21 ENCOUNTER — Ambulatory Visit
Admission: EM | Admit: 2022-05-21 | Discharge: 2022-05-21 | Disposition: A | Discharge status: Home / Self Care | Payer: Medicare Other | Attending: Family Medicine | Admitting: Family Medicine

## 2022-05-21 DIAGNOSIS — R202 Paresthesia of skin: Secondary | ICD-10-CM | POA: Diagnosis not present

## 2022-05-21 NOTE — Discharge Instructions (Signed)
You have had labs (blood tests) sent today. We will call you with any significant abnormalities or if there is need to begin or change treatment or pursue further follow up.  You may also review your test results online through MyChart. If you do not have a MyChart account, instructions to sign up should be on your discharge paperwork.  

## 2022-05-21 NOTE — ED Triage Notes (Signed)
Pain in both legs from groin area down.  States feet feel numb.  Pain since January.  States legs feel sore.

## 2022-05-22 LAB — CBC
Hematocrit: 39.7 % (ref 34.0–46.6)
Hemoglobin: 13.1 g/dL (ref 11.1–15.9)
MCH: 30.6 pg (ref 26.6–33.0)
MCHC: 33 g/dL (ref 31.5–35.7)
MCV: 93 fL (ref 79–97)
Platelets: 287 10*3/uL (ref 150–450)
RBC: 4.28 x10E6/uL (ref 3.77–5.28)
RDW: 12.3 % (ref 11.7–15.4)
WBC: 7.8 10*3/uL (ref 3.4–10.8)

## 2022-05-22 LAB — COMPREHENSIVE METABOLIC PANEL
ALT: 14 IU/L (ref 0–32)
AST: 19 IU/L (ref 0–40)
Albumin/Globulin Ratio: 1.3 (ref 1.2–2.2)
Albumin: 4.3 g/dL (ref 3.7–4.7)
Alkaline Phosphatase: 95 IU/L (ref 44–121)
BUN/Creatinine Ratio: 16 (ref 12–28)
BUN: 15 mg/dL (ref 8–27)
Bilirubin Total: 0.4 mg/dL (ref 0.0–1.2)
CO2: 22 mmol/L (ref 20–29)
Calcium: 9.8 mg/dL (ref 8.7–10.3)
Chloride: 101 mmol/L (ref 96–106)
Creatinine, Ser: 0.96 mg/dL (ref 0.57–1.00)
Globulin, Total: 3.2 g/dL (ref 1.5–4.5)
Glucose: 104 mg/dL — ABNORMAL HIGH (ref 70–99)
Potassium: 4.2 mmol/L (ref 3.5–5.2)
Sodium: 140 mmol/L (ref 134–144)
Total Protein: 7.5 g/dL (ref 6.0–8.5)
eGFR: 57 mL/min/{1.73_m2} — ABNORMAL LOW (ref >59)

## 2022-05-22 LAB — VITAMIN B12: Vitamin B-12: 485 pg/mL (ref 232–1245)

## 2022-05-22 LAB — TSH: TSH: 1.11 u[IU]/mL (ref 0.450–4.500)

## 2022-05-22 NOTE — ED Provider Notes (Signed)
[name]   [id number] 05/21/22 Arrival Time: 1200  ASSESSMENT & PLAN:  1. Paresthesia of bilateral legs    Unclear etiology. Will check lab work.  Orders Placed This Encounter  Pending:   TSH   CBC   Comprehensive metabolic panel   Vitamin B12   ----------- Addendum: 05/22/22 Labs reviewed; no significant abnormalities to explain LE paresthesias. ------------  Recommend:  Follow-up Information     Schedule an appointment as soon as possible for a visit  with Margaret Nevins, MD.   Specialty: Internal Medicine Contact information: [address] [place] [address] [phone]                Reviewed expectations re: course of current medical issues. Questions answered. Outlined signs and symptoms indicating need for more acute intervention. Patient verbalized understanding. After Visit Summary given.  SUBJECTIVE: History from: patient. Margaret Thompson is a 89 y.o. female who reports pain in both legs from groin area down. States feet feel numb/tingling; fairly persistent. Symptoms first noted approx 5 mos ago. Has seen PCP. States legs feel sore. No acute changes. "Just wanted to get checked out." Denies LE edema. Normal ambulation. Normal bowel/bladder habits.  Past Surgical History:  Procedure Laterality Date   ABDOMINAL HYSTERECTOMY     BREAST SURGERY     CATARACT EXTRACTION W/PHACO Left 09/04/2014   Procedure: CATARACT EXTRACTION PHACO AND INTRAOCULAR LENS PLACEMENT (IOC);  Surgeon: Gemma Payor, MD;  Location: [facility];  Service: Ophthalmology;  Laterality: Left;  CDE:9.84   CATARACT EXTRACTION W/PHACO Right 11/09/2014   Procedure: CATARACT EXTRACTION PHACO AND INTRAOCULAR LENS PLACEMENT (IOC);  Surgeon: Gemma Payor, MD;  Location: [facility];  Service: Ophthalmology;  Laterality: Right;  CDE:8.40   CORONARY ANGIOPLASTY     RCA stent   ESOPHAGOGASTRODUODENOSCOPY (EGD) WITH PROPOFOL N/A 08/04/2017   Fields: esophagitis, gastritis, no h.pylori.  medium sized hiatal hernia, moderate schatzki ring s/p dilation   LEFT HEART CATHETERIZATION WITH CORONARY ANGIOGRAM N/A 12/21/2012   Procedure: LEFT HEART CATHETERIZATION WITH CORONARY ANGIOGRAM;  Surgeon: Robynn Pane, MD;  [facility]: [facility];  Service: Cardiovascular;  Laterality: N/A;   MASTECTOMY MODIFIED RADICAL Left 01/16/2017   Procedure: SIMPLE MASTECTOMY;  Surgeon: Franky Macho, MD;  Location: [facility];  Service: General;  Laterality: Left;   MASTECTOMY, PARTIAL Left    PERCUTANEOUS STENT INTERVENTION     RCA   PORT-A-CATH REMOVAL     PORT-A-CATH REMOVAL Right 09/13/2018   Procedure: REMOVAL PORT-A-CATH;  Surgeon: Franky Macho, MD;  Location: [facility];  Service: General;  Laterality: Right;   PORTACATH PLACEMENT     PORTACATH PLACEMENT Right 03/11/2017   Procedure: INSERTION PORT-A-CATH;  Surgeon: Franky Macho, MD;  Location: [facility];  Service: General;  Laterality: Right;   SAVORY DILATION N/A 08/04/2017   Procedure: SAVORY DILATION;  Surgeon: West Bali, MD;  [facility]: [facility];  Service: Endoscopy;  Laterality: N/A;      OBJECTIVE:  Vitals:   05/21/22 1205  BP: 138/71  Pulse: (!) 116  Resp: 18  Temp: 98.1 F (36.7 C)  TempSrc: Oral  SpO2: 91%    Tachycardia noted; recheck 104  General appearance: alert; no distress HEENT: [place]; AT Neck: supple with FROM Resp: unlabored respirations Extremities: Bilateral LE appear normal with normal ROM at knee and hip; no LE edema CV: brisk extremity capillary refill of bilateral LE; 2+ DP pulse of bilateral LE. Skin: warm and dry; no visible rashes Neurologic: gait normal; normal sensation  and strength of bilateral LE Psychological: alert and cooperative; normal mood and affect   Allergies  Allergen Reactions   Azithromycin Anaphylaxis and Hives   Ivp Dye [Iodinated Contrast Media] Hives    CT    Cipro [Ciprofloxacin Hcl]    Gatifloxacin    Levaquin [Levofloxacin]     Past Medical History:  Diagnosis Date    Acute MI (HCC) 2003   Adenocarcinoma of left breast (HCC) 11/23/2012   Stage II (T1 C. N1 M0) adenocarcinoma of the left breast with surgery on 03/08/2001. She had a 1.5 cm cancer with 1/6 positive sentinel nodes. Reexcision margins were clear. She did participate in NSABP B.-30 randomized to Adriamycin and Taxotere for 4 cycles followed by radiation therapy from 07/13/2001 to 08/27/2001. She then started Arimidex on 07/07/2001 took that until the end of June 2008. T   Anemia    Bilateral cataracts    Breast cancer (HCC)    GERD (gastroesophageal reflux disease)    History of gout    History of radiation therapy    Left lung- 04/18/21-05/01/21- Dr. Antony Blackbird   Hypercholesteremia    Hypertension    Malignant neoplasm of lower-inner quadrant of left female breast [facility])    Osteoporosis    Social History   Socioeconomic History   Marital status: Widowed    Spouse name: Not on file   Number of children: 2   Years of education: 65   Highest education level: 12th grade  Occupational History   Not on file  Tobacco Use   Smoking status: Every Day    Packs/day: 0.50    Years: 50.00    Additional pack years: 0.00    Total pack years: 25.00    Types: Cigarettes    Passive exposure: Current   Smokeless tobacco: Never   Tobacco comments:    10 cigarettes daily    Smoking Cessation Classes, Services & Resources Offered.  Vaping Use   Vaping Use: Never used  Substance and Sexual Activity   Alcohol use: No   Drug use: No   Sexual activity: Not Currently    Partners: Male    Birth control/protection: Surgical  Other Topics Concern   Not on file  Social History Narrative   Not on file   Social Determinants of Health   Financial Resource Strain: Low Risk  (04/15/2022)   Overall Financial Resource Strain (CARDIA)    Difficulty of Paying Living Expenses: Not hard at all  Food Insecurity: No Food Insecurity (04/15/2022)   Hunger Vital Sign    Worried About Running Out of Food in  the Last Year: Never true    Ran Out of Food in the Last Year: Never true  Transportation Needs: No Transportation Needs (04/15/2022)   PRAPARE - Administrator, Civil Service (Medical): No    Lack of Transportation (Non-Medical): No  Physical Activity: Sufficiently Active (04/15/2022)   Exercise Vital Sign    Days of Exercise per Week: 5 days    Minutes of Exercise per Session: 30 min  Stress: No Stress Concern Present (04/15/2022)   [employer] of Occupational Health - Occupational Stress Questionnaire    Feeling of Stress : Not at all  Social Connections: Moderately Integrated (04/15/2022)   Social Connection and Isolation Panel [NHANES]    Frequency of Communication with Friends and Family: More than three times a week    Frequency of Social Gatherings with Friends and Family: More than three times a week  Attends Religious Services: More than 4 times per year    Active Member of Clubs or Organizations: Yes    Attends Banker Meetings: More than 4 times per year    Marital Status: Widowed   Family History  Problem Relation Age of Onset   Stroke Mother    Colon cancer Neg Hx    Gastric cancer Neg Hx    Esophageal cancer Neg Hx    Past Surgical History:  Procedure Laterality Date   ABDOMINAL HYSTERECTOMY     BREAST SURGERY     CATARACT EXTRACTION W/PHACO Left 09/04/2014   Procedure: CATARACT EXTRACTION PHACO AND INTRAOCULAR LENS PLACEMENT (IOC);  Surgeon: Gemma Payor, MD;  [facility]: [facility];  Service: Ophthalmology;  Laterality: Left;  CDE:9.84   CATARACT EXTRACTION W/PHACO Right 11/09/2014   Procedure: CATARACT EXTRACTION PHACO AND INTRAOCULAR LENS PLACEMENT (IOC);  Surgeon: Gemma Payor, MD;  [facility]: [facility];  Service: Ophthalmology;  Laterality: Right;  CDE:8.40   CORONARY ANGIOPLASTY     RCA stent   ESOPHAGOGASTRODUODENOSCOPY (EGD) WITH PROPOFOL N/A 08/04/2017   Fields: esophagitis, gastritis, no h.pylori. medium sized hiatal hernia, moderate schatzki  ring s/p dilation   LEFT HEART CATHETERIZATION WITH CORONARY ANGIOGRAM N/A 12/21/2012   Procedure: LEFT HEART CATHETERIZATION WITH CORONARY ANGIOGRAM;  Surgeon: Robynn Pane, MD;  [facility];  Service: Cardiovascular;  Laterality: N/A;   MASTECTOMY MODIFIED RADICAL Left 01/16/2017   Procedure: SIMPLE MASTECTOMY;  Surgeon: Franky Macho, MD;  Location: [facility];  Service: General;  Laterality: Left;   MASTECTOMY, PARTIAL Left    PERCUTANEOUS STENT INTERVENTION     RCA   PORT-A-CATH REMOVAL     PORT-A-CATH REMOVAL Right 09/13/2018   Procedure: REMOVAL PORT-A-CATH;  Surgeon: Franky Macho, MD;  Location: [facility];  Service: General;  Laterality: Right;   PORTACATH PLACEMENT     PORTACATH PLACEMENT Right 03/11/2017   Procedure: INSERTION PORT-A-CATH;  Surgeon: Franky Macho, MD;  Location: [facility];  Service: General;  Laterality: Right;   SAVORY DILATION N/A 08/04/2017   Procedure: SAVORY DILATION;  Surgeon: West Bali, MD;  [facility];  Service: Endoscopy;  Laterality: N/A;       Mardella Layman, MD 05/22/22 1440

## 2022-05-30 ENCOUNTER — Telehealth (HOSPITAL_COMMUNITY): Payer: Self-pay | Admitting: Emergency Medicine

## 2022-05-30 NOTE — Telephone Encounter (Signed)
Patient called wanting review of her labs from her visit earlier this month.  Reviewed, f/u with PCP recommended

## 2022-06-12 DIAGNOSIS — J449 Chronic obstructive pulmonary disease, unspecified: Secondary | ICD-10-CM | POA: Diagnosis not present

## 2022-06-12 DIAGNOSIS — A6922 Other neurologic disorders in Lyme disease: Secondary | ICD-10-CM | POA: Diagnosis not present

## 2022-06-12 DIAGNOSIS — R0600 Dyspnea, unspecified: Secondary | ICD-10-CM | POA: Diagnosis not present

## 2022-06-12 DIAGNOSIS — N182 Chronic kidney disease, stage 2 (mild): Secondary | ICD-10-CM | POA: Diagnosis not present

## 2022-06-12 DIAGNOSIS — Z0001 Encounter for general adult medical examination with abnormal findings: Secondary | ICD-10-CM | POA: Diagnosis not present

## 2022-06-12 DIAGNOSIS — K219 Gastro-esophageal reflux disease without esophagitis: Secondary | ICD-10-CM | POA: Diagnosis not present

## 2022-06-12 DIAGNOSIS — R0789 Other chest pain: Secondary | ICD-10-CM | POA: Diagnosis not present

## 2022-06-12 DIAGNOSIS — M810 Age-related osteoporosis without current pathological fracture: Secondary | ICD-10-CM | POA: Diagnosis not present

## 2022-06-12 DIAGNOSIS — Z681 Body mass index (BMI) 19 or less, adult: Secondary | ICD-10-CM | POA: Diagnosis not present

## 2022-06-15 ENCOUNTER — Ambulatory Visit
Admission: EM | Admit: 2022-06-15 | Discharge: 2022-06-15 | Disposition: A | Discharge status: Home / Self Care | Payer: Medicare Other | Attending: Nurse Practitioner | Admitting: Nurse Practitioner

## 2022-06-15 DIAGNOSIS — W57XXXA Bitten or stung by nonvenomous insect and other nonvenomous arthropods, initial encounter: Secondary | ICD-10-CM | POA: Diagnosis not present

## 2022-06-15 DIAGNOSIS — S00462A Insect bite (nonvenomous) of left ear, initial encounter: Secondary | ICD-10-CM | POA: Diagnosis not present

## 2022-06-15 MED ORDER — TRIAMCINOLONE ACETONIDE 0.1 % EX OINT: 1.0000 | TOPICAL_OINTMENT | Freq: Two times a day (BID) | CUTANEOUS | 0 refills | Status: DC

## 2022-06-15 NOTE — Discharge Instructions (Signed)
The insect bite on your left ear today appears to be healing well.  Recommend cleaning twice daily with mild soap and water.  You can apply the Kenalog ointment 2 times daily as needed for itching.  Seek care if you develop fever, headache, neck stiffness, new body pain/redness, altered mental status.

## 2022-06-15 NOTE — ED Triage Notes (Signed)
Pt reports she thinks a tick bit her on her left ear x 3 days.  She scratched it and something fell off on her arm and it was a black bug. Primary care dr said her ear looks swollen.

## 2022-06-15 NOTE — ED Provider Notes (Signed)
[facility]    CSN: [id number] Arrival date & time: 06/15/22  1333      History   Chief Complaint No chief complaint on file.   HPI Margaret Thompson is a 89 y.o. female.   Patient presents today with possible tick bite to left earlobe that she noticed 3 days ago.  Reports she was unable to view the bug in detail because she did not have her glasses, but thinks it had a white spot on the back of it.  She is concerned for Lyme disease today.  She denies fever, new body aches or joint pains, headache, neck stiffness, and altered mental status.  She reports her ear is sometimes a little bit itchy.  She has been putting rubbing alcohol on the ear.    Past Medical History:  Diagnosis Date   Acute MI (HCC) 2003   Adenocarcinoma of left breast (HCC) 11/23/2012   Stage II (T1 C. N1 M0) adenocarcinoma of the left breast with surgery on 03/08/2001. She had a 1.5 cm cancer with 1/6 positive sentinel nodes. Reexcision margins were clear. She did participate in NSABP B.-30 randomized to Adriamycin and Taxotere for 4 cycles followed by radiation therapy from 07/13/2001 to 08/27/2001. She then started Arimidex on 07/07/2001 took that until the end of June 2008. T   Anemia    Bilateral cataracts    Breast cancer (HCC)    GERD (gastroesophageal reflux disease)    History of gout    History of radiation therapy    Left lung- 04/18/21-05/01/21- Dr. 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   Oral thrush 12/21/2019   Postprandial vomiting 09/21/2019   Abnormal LFTs 09/21/2019   Constipation 11/30/2018   Preventative health care 05/26/2018   Abdominal pain, epigastric 11/24/2017   Odynophagia 06/04/2017   GERD (gastroesophageal reflux disease) 06/04/2017   Malignant neoplasm of left female  breast (HCC)    Acute MI, 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   History of left breast cancer (2003) 11/23/2012   Bursitis, shoulder 05/25/2012    Past Surgical History:  Procedure Laterality Date   ABDOMINAL HYSTERECTOMY     BREAST SURGERY     CATARACT EXTRACTION W/PHACO Left 09/04/2014   Procedure: CATARACT EXTRACTION PHACO AND INTRAOCULAR LENS PLACEMENT (IOC);  Surgeon: Gemma Payor, MD;  Location: [facility];  Service: Ophthalmology;  Laterality: Left;  CDE:9.84   CATARACT EXTRACTION W/PHACO Right 11/09/2014   Procedure: CATARACT EXTRACTION PHACO AND INTRAOCULAR LENS PLACEMENT (IOC);  Surgeon: Gemma Payor, MD;  Location: [facility];  Service: Ophthalmology;  Laterality: Right;  CDE:8.40   CORONARY ANGIOPLASTY     RCA stent   ESOPHAGOGASTRODUODENOSCOPY (EGD) WITH PROPOFOL N/A 08/04/2017   Fields: esophagitis, gastritis, no h.pylori. medium sized hiatal hernia, moderate schatzki ring s/p dilation   LEFT HEART CATHETERIZATION WITH CORONARY ANGIOGRAM N/A 12/21/2012   Procedure: LEFT HEART CATHETERIZATION WITH CORONARY ANGIOGRAM;  Surgeon: Robynn Pane, MD;  [facility];  Service: Cardiovascular;  Laterality: N/A;   MASTECTOMY MODIFIED RADICAL Left 01/16/2017   Procedure: SIMPLE MASTECTOMY;  Surgeon: Franky Macho, MD;  Location: [facility];  Service: General;  Laterality: Left;   MASTECTOMY, PARTIAL Left    PERCUTANEOUS STENT INTERVENTION     RCA   PORT-A-CATH REMOVAL  PORT-A-CATH REMOVAL Right 09/13/2018   Procedure: REMOVAL PORT-A-CATH;  Surgeon: Franky Macho, MD;  Location: [facility];  Service: General;  Laterality: Right;   PORTACATH PLACEMENT     PORTACATH PLACEMENT Right 03/11/2017   Procedure: INSERTION PORT-A-CATH;  Surgeon: Franky Macho, MD;  Location: [facility];  Service: General;  Laterality: Right;   SAVORY DILATION N/A 08/04/2017   Procedure: SAVORY DILATION;  Surgeon: West Bali, MD;  [facility];  Service: Endoscopy;  Laterality: N/A;    OB History   No  obstetric history on file.      Home Medications    Prior to Admission medications   Medication Sig Start Date End Date Taking? Authorizing Provider  aspirin EC 81 MG tablet Take 81 mg by mouth daily.    [provider]  atorvastatin (LIPITOR) 40 MG tablet Take 40 mg by mouth daily. 11/26/16   [provider]  diltiazem (CARDIZEM CD) 180 MG 24 hr capsule Take 180 mg by mouth daily. 03/09/20   [provider]  gabapentin (NEURONTIN) 100 MG capsule Take 3 capsules (300 mg total) by mouth at bedtime. 02/17/22   Doreatha Massed, MD  triamcinolone ointment (KENALOG) 0.1 % Apply 1 Application topically 2 (two) times daily. Apply sparingly twice daily as needed for itching.  Do not use for more than 14 days in a row. 06/15/22  Yes Valentino Nose, NP    Family History Family History  Problem Relation Age of Onset   Stroke Mother    Colon cancer Neg Hx    Gastric cancer Neg Hx    Esophageal cancer Neg Hx     Social History Social History   Tobacco Use   Smoking status: Every Day    Packs/day: 0.50    Years: 50.00    Additional pack years: 0.00    Total pack years: 25.00    Types: Cigarettes    Passive exposure: Current   Smokeless tobacco: Never   Tobacco comments:    10 cigarettes daily    Smoking Cessation Classes, Services & Resources Offered.  Vaping Use   Vaping Use: Never used  Substance Use Topics   Alcohol use: No   Drug use: No     Allergies   Azithromycin, Ivp dye [iodinated contrast media], Cipro [ciprofloxacin hcl], Gatifloxacin, and Levaquin [levofloxacin]   Review of Systems Review of Systems Per HPI  Physical Exam Triage Vital Signs ED Triage Vitals [06/15/22 1406]  Enc Vitals Group     BP 123/69     Pulse Rate 91     Resp 16     Temp 98 F (36.7 C)     Temp Source Oral     SpO2 93 %     Weight      Height      Head Circumference      Peak Flow      Pain Score 0     Pain Loc      Pain Edu?      Excl. in  GC?    No data found.  Updated Vital Signs BP 123/69 (BP Location: Right Arm)   Pulse 91   Temp 98 F (36.7 C) (Oral)   Resp 16   SpO2 93%   Visual Acuity Right Eye Distance:   Left Eye Distance:   Bilateral Distance:    Right Eye Near:   Left Eye Near:    Bilateral Near:     Physical Exam Vitals and nursing  note reviewed.  Constitutional:      General: She is not in acute distress.    Appearance: Normal appearance. She is not toxic-appearing.  HENT:     Mouth/Throat:     Mouth: Mucous membranes are moist.     Pharynx: Oropharynx is clear. No oropharyngeal exudate or posterior oropharyngeal erythema.  Pulmonary:     Effort: Pulmonary effort is normal. No respiratory distress.  Musculoskeletal:     Cervical back: Normal range of motion.  Lymphadenopathy:     Cervical: No cervical adenopathy.  Skin:    General: Skin is warm and dry.     Capillary Refill: Capillary refill takes less than 2 seconds.     Findings: Lesion present.     Comments: Approximately 1 cm x 1 cm area of erythema to left earlobe.  There is no fluctuance, active drainage.  There is a pinpoint area with a scab.  No erythema migrans appreciated.  Neurological:     Mental Status: She is alert and oriented to person, place, and time.  Psychiatric:        Behavior: Behavior is cooperative.      UC Treatments / Results  Labs (all labs ordered are listed, but only abnormal results are displayed) Labs Reviewed - No data to display  EKG   Radiology No results found.  Procedures Procedures (including critical care time)  Medications Ordered in UC Medications - No data to display  Initial Impression / Assessment and Plan / UC Course  I have reviewed the triage vital signs and the nursing notes.  Pertinent labs & imaging results that were available during my care of the patient were reviewed by me and considered in my medical decision making (see chart for details).   Patient is  well-appearing, normotensive, afebrile, not tachycardic, not tachypneic, oxygenating well on room air.    1. Tick bite of left ear, initial encounter Patient is out of window for prophylactic doxycycline Treat itching with triamcinolone ointment twice daily as needed Reassurance provided Supportive care discussed Strict ER precautions discussed  The patient was given the opportunity to ask questions.  All questions answered to their satisfaction.  The patient is in agreement to this plan.    Final Clinical Impressions(s) / UC Diagnoses   Final diagnoses:  Tick bite of left ear, initial encounter     Discharge Instructions      The insect bite on your left ear today appears to be healing well.  Recommend cleaning twice daily with mild soap and water.  You can apply the Kenalog ointment 2 times daily as needed for itching.  Seek care if you develop fever, headache, neck stiffness, new body pain/redness, altered mental status.    ED Prescriptions     Medication Sig Dispense Auth. Provider   triamcinolone ointment (KENALOG) 0.1 % Apply 1 Application topically 2 (two) times daily. Apply sparingly twice daily as needed for itching.  Do not use for more than 14 days in a row. 15 g Valentino Nose, NP      PDMP not reviewed this encounter.   Valentino Nose, NP 06/15/22 [phone]

## 2022-07-13 DIAGNOSIS — N390 Urinary tract infection, site not specified: Secondary | ICD-10-CM | POA: Diagnosis not present

## 2022-07-13 DIAGNOSIS — Z6823 Body mass index (BMI) 23.0-23.9, adult: Secondary | ICD-10-CM | POA: Diagnosis not present

## 2022-08-07 DIAGNOSIS — E782 Mixed hyperlipidemia: Secondary | ICD-10-CM | POA: Diagnosis not present

## 2022-08-07 DIAGNOSIS — K219 Gastro-esophageal reflux disease without esophagitis: Secondary | ICD-10-CM | POA: Diagnosis not present

## 2022-08-07 DIAGNOSIS — F17290 Nicotine dependence, other tobacco product, uncomplicated: Secondary | ICD-10-CM | POA: Diagnosis not present

## 2022-08-07 DIAGNOSIS — I10 Essential (primary) hypertension: Secondary | ICD-10-CM | POA: Diagnosis not present

## 2022-08-07 DIAGNOSIS — I2510 Atherosclerotic heart disease of native coronary artery without angina pectoris: Secondary | ICD-10-CM | POA: Diagnosis not present

## 2022-08-11 ENCOUNTER — Inpatient Hospital Stay: Payer: Medicare Other | Attending: Hematology

## 2022-08-11 ENCOUNTER — Ambulatory Visit (HOSPITAL_COMMUNITY)
Admission: RE | Admit: 2022-08-11 | Discharge: 2022-08-11 | Disposition: A | Discharge status: Home / Self Care | Payer: Medicare Other | Source: Ambulatory Visit | Attending: Hematology | Admitting: Hematology

## 2022-08-11 ENCOUNTER — Other Ambulatory Visit: Payer: Self-pay | Admitting: Hematology

## 2022-08-11 DIAGNOSIS — C50912 Malignant neoplasm of unspecified site of left female breast: Secondary | ICD-10-CM | POA: Insufficient documentation

## 2022-08-11 DIAGNOSIS — Z853 Personal history of malignant neoplasm of breast: Secondary | ICD-10-CM | POA: Insufficient documentation

## 2022-08-11 DIAGNOSIS — R911 Solitary pulmonary nodule: Secondary | ICD-10-CM | POA: Diagnosis not present

## 2022-08-11 DIAGNOSIS — Z171 Estrogen receptor negative status [ER-]: Secondary | ICD-10-CM | POA: Insufficient documentation

## 2022-08-11 DIAGNOSIS — M810 Age-related osteoporosis without current pathological fracture: Secondary | ICD-10-CM | POA: Diagnosis not present

## 2022-08-11 DIAGNOSIS — R918 Other nonspecific abnormal finding of lung field: Secondary | ICD-10-CM | POA: Insufficient documentation

## 2022-08-11 DIAGNOSIS — S2242XA Multiple fractures of ribs, left side, initial encounter for closed fracture: Secondary | ICD-10-CM | POA: Diagnosis not present

## 2022-08-11 DIAGNOSIS — Z9012 Acquired absence of left breast and nipple: Secondary | ICD-10-CM | POA: Diagnosis not present

## 2022-08-11 LAB — CBC WITH DIFFERENTIAL/PLATELET
Abs Immature Granulocytes: 0.02 10*3/uL (ref 0.00–0.07)
Basophils Absolute: 0 10*3/uL (ref 0.0–0.1)
Basophils Relative: 0 %
Eosinophils Absolute: 0 10*3/uL (ref 0.0–0.5)
Eosinophils Relative: 0 %
HCT: 40.4 % (ref 36.0–46.0)
Hemoglobin: 13.2 g/dL (ref 12.0–15.0)
Immature Granulocytes: 1 %
Lymphocytes Relative: 21 %
Lymphs Abs: 0.9 10*3/uL (ref 0.7–4.0)
MCH: 30.6 pg (ref 26.0–34.0)
MCHC: 32.7 g/dL (ref 30.0–36.0)
MCV: 93.7 fL (ref 80.0–100.0)
Monocytes Absolute: 0 10*3/uL — ABNORMAL LOW (ref 0.1–1.0)
Monocytes Relative: 1 %
Neutro Abs: 3.1 10*3/uL (ref 1.7–7.7)
Neutrophils Relative %: 77 %
Platelets: 293 10*3/uL (ref 150–400)
RBC: 4.31 MIL/uL (ref 3.87–5.11)
RDW: 12.6 % (ref 11.5–15.5)
WBC: 4.1 10*3/uL (ref 4.0–10.5)
nRBC: 0 % (ref 0.0–0.2)

## 2022-08-11 LAB — COMPREHENSIVE METABOLIC PANEL
ALT: 16 U/L (ref 0–44)
AST: 22 U/L (ref 15–41)
Albumin: 4 g/dL (ref 3.5–5.0)
Alkaline Phosphatase: 78 U/L (ref 38–126)
Anion gap: 9 (ref 5–15)
BUN: 9 mg/dL (ref 8–23)
CO2: 22 mmol/L (ref 22–32)
Calcium: 9.7 mg/dL (ref 8.9–10.3)
Chloride: 103 mmol/L (ref 98–111)
Creatinine, Ser: 0.93 mg/dL (ref 0.44–1.00)
GFR, Estimated: 59 mL/min — ABNORMAL LOW (ref >60)
Glucose, Bld: 190 mg/dL — ABNORMAL HIGH (ref 70–99)
Potassium: 3.5 mmol/L (ref 3.5–5.1)
Sodium: 134 mmol/L — ABNORMAL LOW (ref 135–145)
Total Bilirubin: 0.7 mg/dL (ref 0.3–1.2)
Total Protein: 8 g/dL (ref 6.5–8.1)

## 2022-08-11 LAB — VITAMIN D 25 HYDROXY (VIT D DEFICIENCY, FRACTURES): Vit D, 25-Hydroxy: 42.08 ng/mL (ref 30–100)

## 2022-08-11 MED ORDER — IOHEXOL 300 MG/ML  SOLN
75.0000 mL | Freq: Once | INTRAMUSCULAR | Status: AC | PRN
  Administered 2022-08-11: 75 mL via INTRAVENOUS

## 2022-08-18 ENCOUNTER — Inpatient Hospital Stay: Payer: Medicare Other | Attending: Hematology | Admitting: Hematology

## 2022-08-18 VITALS — BP 119/55 | HR 73 | Temp 98.1°F | Resp 17 | Wt 113.8 lb

## 2022-08-18 DIAGNOSIS — R918 Other nonspecific abnormal finding of lung field: Secondary | ICD-10-CM

## 2022-08-18 DIAGNOSIS — M810 Age-related osteoporosis without current pathological fracture: Secondary | ICD-10-CM | POA: Insufficient documentation

## 2022-08-18 DIAGNOSIS — Z853 Personal history of malignant neoplasm of breast: Secondary | ICD-10-CM | POA: Diagnosis not present

## 2022-08-18 DIAGNOSIS — C50912 Malignant neoplasm of unspecified site of left female breast: Secondary | ICD-10-CM

## 2022-08-18 DIAGNOSIS — Z9012 Acquired absence of left breast and nipple: Secondary | ICD-10-CM | POA: Diagnosis not present

## 2022-08-18 DIAGNOSIS — C7802 Secondary malignant neoplasm of left lung: Secondary | ICD-10-CM | POA: Diagnosis not present

## 2022-08-18 DIAGNOSIS — Z171 Estrogen receptor negative status [ER-]: Secondary | ICD-10-CM

## 2022-08-18 NOTE — Patient Instructions (Addendum)
[facility]  Discharge Instructions  You were seen and examined today by Dr. Ellin Saba.  Dr. Ellin Saba discussed your most recent lab work and CT scan which is stable.  You do have broken left ribs and you have thickening of your stomach wall.   Follow-up as scheduled.  Thank you for choosing [facility] to provide your oncology and hematology care.   To afford each patient quality time with our provider, please arrive at least 15 minutes before your scheduled appointment time. You may need to reschedule your appointment if you arrive late (10 or more minutes). Arriving late affects you and other patients whose appointments are after yours.  Also, if you miss three or more appointments without notifying the office, you may be dismissed from the clinic at the provider's discretion.    Again, thank you for choosing [facility].  Our hope is that these requests will decrease the amount of time that you wait before being seen by our physicians.   If you have a lab appointment with the Cancer Center - please note that after April 8th, all labs will be drawn in the cancer center.  You do not have to check in or register with the main entrance as you have in the past but will complete your check-in at the cancer center.            _____________________________________________________________  Should you have questions after your visit to [facility], please contact our office at [phone] and follow the prompts.  Our office hours are 8:00 a.m. to 4:30 p.m. Monday - Thursday and 8:00 a.m. to 2:30 p.m. Friday.  Please note that voicemails left after 4:00 p.m. may not be returned until the following business day.  We are closed weekends and all major holidays.  You do have access to a nurse 24-7, just call the main number to the clinic [phone] and do not press any options, hold on the line and a nurse will answer  the phone.    For prescription refill requests, have your pharmacy contact our office and allow 72 hours.    Masks are no longer required in the cancer centers. If you would like for your care team to wear a mask while they are taking care of you, please let them know. You may have one support person who is at least 87 years old accompany you for your appointments.

## 2022-08-18 NOTE — Progress Notes (Signed)
[facility] [address], [place] [address]    Clinic Day:  08/18/2022  Referring physician: [name], MD  Patient Care Team: [name], MD as PCP - General (Internal Medicine) West Bali, MD (Inactive) as Consulting Physician (Gastroenterology) Lanelle Bal, DO as Consulting Physician (Internal Medicine)   ASSESSMENT & PLAN:   Assessment:  1.  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 Lovell Sheehan on 01/16/2017 showing a 1.8 cm primary tumor, grade 3, LVI positive, clear surgical margins, with high-grade DCIS.  Lymph nodes cannot be removed due to prior history of left breast cancer treatment. -PET scan on 03/27/2017 showed mildly hypermetabolic small prevascular lymph node and right lower peritracheal lymph node indeterminate.  Moderate metabolic activity associated with ill defined left upper lobe nodule. -PET scan on 08/15/2017 showed mild reduction in size and activity of the left and left upper lobe pulmonary nodules, favoring benign etiology.  Stable small prevascular lymph nodes and reduced size and activity of the lower pretracheal lymph node.  Overall suggest reactive/inflammatory etiology. -Adjuvant chemotherapy was not recommended as more than 7 months have elapsed since her surgery. -She reports mild tenderness at the mastectomy site which is stable.  She also reports tightness at times. -She was given a referral to remove her Port-A-Cath at her last visit. -Last mammogram done on 09/02/2019 was BI-RADS Category 1 negative.     2.  History of node positive left breast cancer: -Diagnosed on 02/16/2001, T1 CN1M0, stage IIa, ER/PR positive, HER-2 negative, 1.5 cm, 1 out of 6 lymph nodes positive.   3.  PET positive left lung nodule: - SBRT to the left lung lesion from 04/28/2021 through 05/01/2021, 5 fractions, 60 Gray  Plan:  1.  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  Rest breast has no palpable masses.  No palpable adenopathy. - Right breast mammogram on 12/19/2021: BI-RADS Category 1.   2.   PET positive left lung nodule: - She complains of pain in the left lateral chest wall likely from combination of surgery and radiation.  She has tender points on the left chest wall. - Reviewed CT chest from 08/11/2022: Stable lingular nodule, adjacent irregular linear consolidations of the lingula and left lower lobe, likely evolving postradiation change.  Stable small solid lung nodule in the right lower lobe 5 mm.  Persistent focal thickening of the fundus of the stomach. - We also discussed new subacute fractures of the anterolateral left chest wall ribs.  She denies any recent falls.  She reported fall 2 years ago when she fractured her ribs. - Recommend follow-up CT chest without contrast in 6 months.   3.  Osteoporosis: - Continue calcium supplements.  Vitamin D level is 42.  She is not on any vitamin D supplements.     Breast Cancer therapy associated bone loss: I have recommended calcium, Vitamin D and weight bearing exercises.  Orders Placed This Encounter  Procedures   CT Chest Wo Contrast    Standing Status:   Future    Standing Expiration Date:   08/18/2023    Order Specific Question:   Preferred imaging location?    Answer:   [facility]    Order Specific Question:   Release to patient    Answer:   Immediate [1]   CBC with Differential    Standing Status:   Future  Standing Expiration Date:   08/18/2023   Comprehensive metabolic panel    Standing Status:   Future    Standing Expiration Date:   08/18/2023   Vitamin D 25 hydroxy    Standing Status:   Future    Standing Expiration Date:   08/18/2023      Alben Deeds Teague,acting as a scribe for Doreatha Massed, MD.,have documented all relevant documentation on the behalf of Doreatha Massed, MD,as directed by  Doreatha Massed, MD while  in the presence of Doreatha Massed, MD.   I, Doreatha Massed MD, have reviewed the above documentation for accuracy and completeness, and I agree with the above.   Doreatha Massed, MD   8/5/20244:51 PM  CHIEF COMPLAINT:   Diagnosis: Malignant neoplasm of left breast in female, estrogen receptor negative,    Cancer Staging  History of left breast cancer (2003) Staging form: Breast, AJCC 7th Edition - Clinical: Stage IIA (T1c, N1, cM0) - Signed by Ellouise Newer, PA-C on 11/23/2012  Malignant neoplasm of left female breast [facility]) Staging form: Breast, AJCC 8th Edition - Pathologic stage from 01/19/2017: Stage Unknown (pT1c, pNX, cM0, G3, ER-, PR-, HER2-) - Signed by Ellouise Newer, PA-C on 02/27/2017    Prior Therapy: Left simple mastectomy on 01/16/17  Current Therapy: Observation   HISTORY OF PRESENT ILLNESS:   Oncology History  Malignant neoplasm of left female breast (HCC)  08/21/2016 Pathology Results   Left needle core biopsy, lower-inner quadrant: invasive ductal carcinoma and DCIS.  ER/PR-, HER2-.    01/16/2017 Surgery   Left simple mastectomy by Dr. Mauri Brooklyn ductal carcinoma, grade 3, measuring 1.8 cm with DCIS (high-grade).  Lymphovascular invasion is identified.  Clear surgical margins.      INTERVAL HISTORY:   Margaret Thompson is a 89 y.o. female presenting to clinic today for follow up of left breast and lung cancer. She was last seen by me on 02/17/22.  Since her last visit, she underwent a chest CT on 7/29 that found: stable lingular nodule; adjacent irregular linear consolidations of the lingula and left lower lobe with areas of that appear more confluent when compared with prior exam, likely evolving postradiation change; stable small solid pulmonary nodule of the right lower lobe measuring 5 mm; persistent focal thickening of the fundus of the stomach, possibly due to redundant mucosa, although ulcer or neoplasm can not be excluded; and multiple new  subacute appearing fractures of the anterolateral left chest wall spanning the 5th-8th ribs.  Of note, she presented to the ED on 05/21/22 for paresthesia of bilateral legs without known cause. She also presented to the ED on 06/15/22 for a tick bite of the left ear. She was told to treat the itch on her ear from the tick bite with triamcinolone ointment b.i.d. and discharged the same day.   Today, she states that she is doing well overall. Her appetite level is at 60%. Her energy level is at 0%.  She c/o low energy levels. She denies any history of stomach ulcers or stomach issues after eating.   She notes she had a fall 2 years ago on her left side. She broke her shoulder then and still has residual tightness and pain under the left shoulder and chest wall. She reports radiation given to the left side exacerbated the tightness and pain. She puts rubbing alcohol to the area and it temporarily helps her symptoms. She is taking Calcium OD, but not Vitamin D supplements.   PAST MEDICAL HISTORY:  Past Medical History: Past Medical History:  Diagnosis Date   Acute MI (HCC) 2003   Adenocarcinoma of left breast (HCC) 11/23/2012   Stage II (T1 C. N1 M0) adenocarcinoma of the left breast with surgery on 03/08/2001. She had a 1.5 cm cancer with 1/6 positive sentinel nodes. Reexcision margins were clear. She did participate in NSABP B.-30 randomized to Adriamycin and Taxotere for 4 cycles followed by radiation therapy from 07/13/2001 to 08/27/2001. She then started Arimidex on 07/07/2001 took that until the end of June 2008. T   Anemia    Bilateral cataracts    Breast cancer (HCC)    GERD (gastroesophageal reflux disease)    History of gout    History of radiation therapy    Left lung- 04/18/21-05/01/21- Dr. Antony Blackbird   Hypercholesteremia    Hypertension    Malignant neoplasm of lower-inner quadrant of left female breast [facility])    Osteoporosis     Surgical History: Past Surgical History:   Procedure Laterality Date   ABDOMINAL HYSTERECTOMY     BREAST SURGERY     CATARACT EXTRACTION W/PHACO Left 09/04/2014   Procedure: CATARACT EXTRACTION PHACO AND INTRAOCULAR LENS PLACEMENT (IOC);  Surgeon: Gemma Payor, MD;  Location: [facility] ORS;  Service: Ophthalmology;  Laterality: Left;  CDE:9.84   CATARACT EXTRACTION W/PHACO Right 11/09/2014   Procedure: CATARACT EXTRACTION PHACO AND INTRAOCULAR LENS PLACEMENT (IOC);  Surgeon: Gemma Payor, MD;  Location: [facility] OR[facility];  Service: Ophthalmology;  Laterality: Right;  CDE:8.40   CORONARY ANGIOPLASTY     RCA stent   ESOPHAGOGASTRODUODENOSCOPY (EGD) WITH PROPOFOL N/A 08/04/2017   Fields: esophagitis, gastritis, no h.pylori. medium sized hiatal hernia, moderate schatzki ring s/p dilation   LEFT HEART CATHETERIZATION WITH CORONARY ANGIOGRAM N/A 12/21/2012   Procedure: LEFT HEART CATHETERIZATION WITH CORONARY ANGIOGRAM;  Surgeon: Robynn Pane, MD;  Location: [facility] CATH LAB;  Service: Cardiovascular;  Laterality: N/A;   MASTECTOMY MODIFIED RADICAL Left 01/16/2017   Procedure: SIMPLE MASTECTOMY;  Surgeon: Franky Macho, MD;  Location: [facility];  Service: General;  Laterality: Left;   MASTECTOMY, PARTIAL Left    PERCUTANEOUS STENT INTERVENTION     RCA   PORT-A-CATH REMOVAL     PORT-A-CATH REMOVAL Right 09/13/2018   Procedure: REMOVAL PORT-A-CATH;  Surgeon: Franky Macho, MD;  Location: [facility] OR[facility];  Service: General;  Laterality: Right;   PORTACATH PLACEMENT     PORTACATH PLACEMENT Right 03/11/2017   Procedure: INSERTION PORT-A-CATH;  Surgeon: Franky Macho, MD;  Location: [facility] OR[facility];  Service: General;  Laterality: Right;   SAVORY DILATION N/A 08/04/2017   Procedure: SAVORY DILATION;  Surgeon: West Bali, MD;  Location: [facility] ENDO SU[facility];  Service: Endoscopy;  Laterality: N/A;    Social History: Social History   Socioeconomic History   Marital status: Widowed    Spouse name: Not on file   Number of children: 2   Years of education: 47   Highest education level: 12th  grade  Occupational History   Not on file  Tobacco Use   Smoking status: Every Day    Current packs/day: 0.50    Average packs/day: 0.5 packs/day for 50.0 years (25.0 ttl pk-yrs)    Types: Cigarettes    Passive exposure: Current   Smokeless tobacco: Never   Tobacco comments:    10 cigarettes daily    Smoking Cessation Classes, Services & Resources Offered.  Vaping Use   Vaping status: Never Used  Substance and Sexual Activity   Alcohol use: No   Drug use: No  Sexual activity: Not Currently    Partners: Male    Birth control/protection: Surgical  Other Topics Concern   Not on file  Social History Narrative   Not on file   Social Determinants of Health   Financial Resource Strain: Low Risk  (04/15/2022)   Overall Financial Resource Strain (CARDIA)    Difficulty of Paying Living Expenses: Not hard at all  Food Insecurity: No Food Insecurity (04/15/2022)   Hunger Vital Sign    Worried About Running Out of Food in the Last Year: Never true    Ran Out of Food in the Last Year: Never true  Transportation Needs: No Transportation Needs (04/15/2022)   PRAPARE - Administrator, Civil Service (Medical): No    Lack of Transportation (Non-Medical): No  Physical Activity: Sufficiently Active (04/15/2022)   Exercise Vital Sign    Days of Exercise per Week: 5 days    Minutes of Exercise per Session: 30 min  Stress: No Stress Concern Present (04/15/2022)   [employer] of Occupational Health - Occupational Stress Questionnaire    Feeling of Stress : Not at all  Social Connections: Moderately Integrated (04/15/2022)   Social Connection and Isolation Panel [NHANES]    Frequency of Communication with Friends and Family: More than three times a week    Frequency of Social Gatherings with Friends and Family: More than three times a week    Attends Religious Services: More than 4 times per year    Active Member of [employer] or Organizations: Yes    Attends Banker Meetings:  More than 4 times per year    Marital Status: Widowed  Intimate Partner Violence: Not At Risk (04/15/2022)   Humiliation, Afraid, Rape, and Kick questionnaire    Fear of Current or Ex-Partner: No    Emotionally Abused: No    Physically Abused: No    Sexually Abused: No    Family History: Family History  Problem Relation Age of Onset   Stroke Mother    Colon cancer Neg Hx    Gastric cancer Neg Hx    Esophageal cancer Neg Hx     Current Medications:  Current Outpatient Medications:    albuterol (VENTOLIN HFA) 108 (90 Base) MCG/ACT inhaler, Inhale 2 puffs into the lungs every 4 (four) hours., Disp: , Rfl:    aspirin EC 81 MG tablet, Take 81 mg by mouth daily., Disp: , Rfl:    atorvastatin (LIPITOR) 40 MG tablet, Take 40 mg by mouth daily., Disp: , Rfl:    diltiazem (CARDIZEM CD) 180 MG 24 hr capsule, Take 180 mg by mouth daily., Disp: , Rfl:    gabapentin (NEURONTIN) 100 MG capsule, Take 3 capsules (300 mg total) by mouth at bedtime., Disp: 30 capsule, Rfl: 3   triamcinolone ointment (KENALOG) 0.1 %, Apply 1 Application topically 2 (two) times daily. Apply sparingly twice daily as needed for itching.  Do not use for more than 14 days in a row., Disp: 15 g, Rfl: 0   Allergies: Allergies  Allergen Reactions   Azithromycin Anaphylaxis and Hives   Ivp Dye [Iodinated Contrast Media] Hives    CT    Cipro [Ciprofloxacin Hcl]    Gatifloxacin    Levaquin [Levofloxacin]     REVIEW OF SYSTEMS:   Review of Systems  Constitutional:  Negative for chills, fatigue and fever.  HENT:   Negative for lump/mass, mouth sores, nosebleeds, sore throat and trouble swallowing.   Eyes:  Negative for eye problems.  Respiratory:  Positive for cough and shortness of breath.   Cardiovascular:  Negative for chest pain, leg swelling and palpitations.  Gastrointestinal:  Negative for abdominal pain, constipation, diarrhea, nausea and vomiting.  Genitourinary:  Negative for bladder incontinence, difficulty  urinating, dysuria, frequency, hematuria and nocturia.   Musculoskeletal:  Negative for arthralgias, back pain, flank pain, myalgias and neck pain.       +pain under left breast, 6/10 severity  Skin:  Negative for itching and rash.  Neurological:  Positive for numbness (in feet). Negative for dizziness and headaches.  Hematological:  Does not bruise/bleed easily.  Psychiatric/Behavioral:  Negative for depression, sleep disturbance and suicidal ideas. The patient is not nervous/anxious.   All other systems reviewed and are negative.    VITALS:   Blood pressure (!) 119/55, pulse 73, temperature 98.1 F (36.7 C), temperature source Tympanic, resp. rate 17, weight 113 lb 12.8 oz (51.6 kg), SpO2 95%.  Wt Readings from Last 3 Encounters:  08/18/22 113 lb 12.8 oz (51.6 kg)  02/17/22 114 lb 4.8 oz (51.8 kg)  12/23/21 104 lb (47.2 kg)    Body mass index is 20.81 kg/m.  Performance status (ECOG): 1 - Symptomatic but completely ambulatory  PHYSICAL EXAM:   Physical Exam Vitals and nursing note reviewed. Exam conducted with a chaperone present.  Constitutional:      Appearance: Normal appearance.  Cardiovascular:     Rate and Rhythm: Normal rate and regular rhythm.     Pulses: Normal pulses.     Heart sounds: Normal heart sounds.  Pulmonary:     Effort: Pulmonary effort is normal.     Breath sounds: Normal breath sounds.  Chest:     Comments: +left mastectomy site WNL +right breast no palpable adenopathy +tenderness to the left axillary region Abdominal:     Palpations: Abdomen is soft. There is no hepatomegaly, splenomegaly or mass.     Tenderness: There is no abdominal tenderness.  Musculoskeletal:     Right lower leg: No edema.     Left lower leg: No edema.  Lymphadenopathy:     Cervical: No cervical adenopathy.     Right cervical: No superficial, deep or posterior cervical adenopathy.    Left cervical: No superficial, deep or posterior cervical adenopathy.     Upper Body:      Right upper body: No supraclavicular or axillary adenopathy.     Left upper body: No supraclavicular or axillary adenopathy.  Neurological:     General: No focal deficit present.     Mental Status: She is alert and oriented to person, place, and time.  Psychiatric:        Mood and Affect: Mood normal.        Behavior: Behavior normal.     LABS:      Latest Ref Rng & Units 08/11/2022    2:39 PM 05/21/2022   12:30 PM 12/17/2021   12:29 PM  CBC  WBC 4.0 - 10.5 K/uL 4.1  7.8  8.5   Hemoglobin 12.0 - 15.0 g/dL 47.4  25.9  56.3   Hematocrit 36.0 - 46.0 % 40.4  39.7  41.1   Platelets 150 - 400 K/uL 293  287  283       Latest Ref Rng & Units 08/11/2022    2:39 PM 05/21/2022   12:30 PM 12/17/2021   12:29 PM  CMP  Glucose 70 - 99 mg/dL 875  643  329   BUN 8 - 23 mg/dL 9  15  13   Creatinine 0.44 - 1.00 mg/dL 6.96  2.95  2.84   Sodium 135 - 145 mmol/L 134  140  140   Potassium 3.5 - 5.1 mmol/L 3.5  4.2  3.6   Chloride 98 - 111 mmol/L 103  101  103   CO2 22 - 32 mmol/L 22  22  28    Calcium 8.9 - 10.3 mg/dL 9.7  9.8  9.4   Total Protein 6.5 - 8.1 g/dL 8.0  7.5  8.1   Total Bilirubin 0.3 - 1.2 mg/dL 0.7  0.4  0.2   Alkaline Phos 38 - 126 U/L 78  95  92   AST 15 - 41 U/L 22  19  24    ALT 0 - 44 U/L 16  14  21       No results found for: "CEA1", "CEA" / No results found for: "CEA1", "CEA" No results found for: "PSA1" No results found for: "XLK440" No results found for: "CAN125"  No results found for: "TOTALPROTELP", "ALBUMINELP", "A1GS", "A2GS", "BETS", "BETA2SER", "GAMS", "MSPIKE", "SPEI" No results found for: "TIBC", "FERRITIN", "IRONPCTSAT" Lab Results  Component Value Date   LDH 129 11/21/2020   LDH 129 03/01/2020   LDH 131 09/05/2019     STUDIES:   CT CHEST WO CONTRAST  Result Date: 08/18/2022 CLINICAL DATA:  Lung nodule; * Tracking Code: BO * EXAM: CT CHEST WITHOUT CONTRAST TECHNIQUE: Multidetector CT imaging of the chest was performed following the standard protocol  without IV contrast. RADIATION DOSE REDUCTION: This exam was performed according to the departmental dose-optimization program which includes automated exposure control, adjustment of the mA and/or kV according to patient size and/or use of iterative reconstruction technique. COMPARISON:  Multiple priors, most recent February 10, 2022 FINDINGS: Cardiovascular: Normal heart size. No pericardial effusion. Focal dilation of the distal descending thoracic aorta measuring up to 3.1 cm, unchanged. Severe atherosclerotic disease of the thoracic aorta. Severe coronary artery calcifications. Mitral annular calcifications. Mediastinum/Nodes: Moderate hiatal hernia. Patulous esophagus. Thyroid is unremarkable. No enlarging lymph nodes seen in the chest. Lungs/Pleura: Central airways are patent. Stable lingular nodule measuring 14 x 13 mm series 5, image 90. Adjacent irregular linear consolidations of the lingula and left lower lobe with areas of appear more confluent when compared with prior exam, likely evolving postradiation change. Stable small solid pulmonary nodule of the right lower lobe measuring 5 mm on series 5, image 100. No pleural effusion. Upper Abdomen: Focal thickening of the fundus of the stomach seen on series 2, image 120. Musculoskeletal: Stable postsurgical findings of left mastectomy. Multiple new subacute appearing fractures of the anterolateral left chest wall spanning the 5th-8th ribs. No aggressive appearing osseous lesions IMPRESSION: 1. Stable lingular nodule. Adjacent irregular linear consolidations of the lingula and left lower lobe with areas of that appear more confluent when compared with prior exam, likely evolving postradiation change. 2. Stable small solid pulmonary nodule of the right lower lobe measuring 5 mm. Recommend attention on follow-up. 3. Persistent focal thickening of the fundus of the stomach, possibly due to redundant mucosa, although ulcer or neoplasm can not be excluded.  Consider further evaluation with endoscopy. 4. Multiple new subacute appearing fractures of the anterolateral left chest wall spanning the 5th-8th ribs. 5. Aortic Atherosclerosis (ICD10-I70.0). Electronically Signed   By: Allegra Lai M.D.   On: 08/18/2022 09:30

## 2022-09-04 DIAGNOSIS — B9689 Other specified bacterial agents as the cause of diseases classified elsewhere: Secondary | ICD-10-CM | POA: Diagnosis not present

## 2022-09-04 DIAGNOSIS — H61001 Unspecified perichondritis of right external ear: Secondary | ICD-10-CM | POA: Diagnosis not present

## 2022-09-04 DIAGNOSIS — L0202 Furuncle of face: Secondary | ICD-10-CM | POA: Diagnosis not present

## 2022-09-22 DIAGNOSIS — L0100 Impetigo, unspecified: Secondary | ICD-10-CM | POA: Diagnosis not present

## 2022-09-22 DIAGNOSIS — Z681 Body mass index (BMI) 19 or less, adult: Secondary | ICD-10-CM | POA: Diagnosis not present

## 2022-09-29 DIAGNOSIS — Z681 Body mass index (BMI) 19 or less, adult: Secondary | ICD-10-CM | POA: Diagnosis not present

## 2022-09-29 DIAGNOSIS — C4491 Basal cell carcinoma of skin, unspecified: Secondary | ICD-10-CM | POA: Diagnosis not present

## 2022-11-06 DIAGNOSIS — I2510 Atherosclerotic heart disease of native coronary artery without angina pectoris: Secondary | ICD-10-CM | POA: Diagnosis not present

## 2022-11-06 DIAGNOSIS — I10 Essential (primary) hypertension: Secondary | ICD-10-CM | POA: Diagnosis not present

## 2022-11-06 DIAGNOSIS — E782 Mixed hyperlipidemia: Secondary | ICD-10-CM | POA: Diagnosis not present

## 2022-11-06 DIAGNOSIS — M150 Primary generalized (osteo)arthritis: Secondary | ICD-10-CM | POA: Diagnosis not present

## 2022-11-13 DIAGNOSIS — K130 Diseases of lips: Secondary | ICD-10-CM | POA: Diagnosis not present

## 2022-12-22 DIAGNOSIS — H04123 Dry eye syndrome of bilateral lacrimal glands: Secondary | ICD-10-CM | POA: Diagnosis not present

## 2023-02-05 DIAGNOSIS — F17290 Nicotine dependence, other tobacco product, uncomplicated: Secondary | ICD-10-CM | POA: Diagnosis not present

## 2023-02-05 DIAGNOSIS — E782 Mixed hyperlipidemia: Secondary | ICD-10-CM | POA: Diagnosis not present

## 2023-02-05 DIAGNOSIS — R0789 Other chest pain: Secondary | ICD-10-CM | POA: Diagnosis not present

## 2023-02-05 DIAGNOSIS — I2510 Atherosclerotic heart disease of native coronary artery without angina pectoris: Secondary | ICD-10-CM | POA: Diagnosis not present

## 2023-02-05 DIAGNOSIS — I10 Essential (primary) hypertension: Secondary | ICD-10-CM | POA: Diagnosis not present

## 2023-02-16 ENCOUNTER — Inpatient Hospital Stay: Payer: Medicare Other | Attending: Hematology

## 2023-02-16 ENCOUNTER — Ambulatory Visit (HOSPITAL_COMMUNITY)
Admission: RE | Admit: 2023-02-16 | Discharge: 2023-02-16 | Disposition: A | Discharge status: Home / Self Care | Payer: Medicare Other | Source: Ambulatory Visit | Attending: Hematology | Admitting: Hematology

## 2023-02-16 DIAGNOSIS — C50912 Malignant neoplasm of unspecified site of left female breast: Secondary | ICD-10-CM | POA: Insufficient documentation

## 2023-02-16 DIAGNOSIS — Z9012 Acquired absence of left breast and nipple: Secondary | ICD-10-CM | POA: Insufficient documentation

## 2023-02-16 DIAGNOSIS — Z85118 Personal history of other malignant neoplasm of bronchus and lung: Secondary | ICD-10-CM | POA: Diagnosis not present

## 2023-02-16 DIAGNOSIS — C3490 Malignant neoplasm of unspecified part of unspecified bronchus or lung: Secondary | ICD-10-CM | POA: Diagnosis not present

## 2023-02-16 DIAGNOSIS — Z853 Personal history of malignant neoplasm of breast: Secondary | ICD-10-CM | POA: Diagnosis not present

## 2023-02-16 DIAGNOSIS — Z171 Estrogen receptor negative status [ER-]: Secondary | ICD-10-CM | POA: Insufficient documentation

## 2023-02-16 DIAGNOSIS — K2289 Other specified disease of esophagus: Secondary | ICD-10-CM | POA: Diagnosis not present

## 2023-02-16 DIAGNOSIS — R918 Other nonspecific abnormal finding of lung field: Secondary | ICD-10-CM

## 2023-02-16 DIAGNOSIS — F17210 Nicotine dependence, cigarettes, uncomplicated: Secondary | ICD-10-CM | POA: Diagnosis not present

## 2023-02-16 DIAGNOSIS — Z923 Personal history of irradiation: Secondary | ICD-10-CM | POA: Diagnosis not present

## 2023-02-16 DIAGNOSIS — M810 Age-related osteoporosis without current pathological fracture: Secondary | ICD-10-CM | POA: Insufficient documentation

## 2023-02-16 LAB — COMPREHENSIVE METABOLIC PANEL
ALT: 20 U/L (ref 0–44)
AST: 23 U/L (ref 15–41)
Albumin: 3.7 g/dL (ref 3.5–5.0)
Alkaline Phosphatase: 81 U/L (ref 38–126)
Anion gap: 11 (ref 5–15)
BUN: 9 mg/dL (ref 8–23)
CO2: 25 mmol/L (ref 22–32)
Calcium: 9.4 mg/dL (ref 8.9–10.3)
Chloride: 103 mmol/L (ref 98–111)
Creatinine, Ser: 0.93 mg/dL (ref 0.44–1.00)
GFR, Estimated: 59 mL/min — ABNORMAL LOW (ref >60)
Glucose, Bld: 102 mg/dL — ABNORMAL HIGH (ref 70–99)
Potassium: 3.7 mmol/L (ref 3.5–5.1)
Sodium: 139 mmol/L (ref 135–145)
Total Bilirubin: 0.6 mg/dL (ref 0.0–1.2)
Total Protein: 7.8 g/dL (ref 6.5–8.1)

## 2023-02-16 LAB — CBC WITH DIFFERENTIAL/PLATELET
Abs Immature Granulocytes: 0.03 10*3/uL (ref 0.00–0.07)
Basophils Absolute: 0.1 10*3/uL (ref 0.0–0.1)
Basophils Relative: 1 %
Eosinophils Absolute: 0.2 10*3/uL (ref 0.0–0.5)
Eosinophils Relative: 2 %
HCT: 42.8 % (ref 36.0–46.0)
Hemoglobin: 13.5 g/dL (ref 12.0–15.0)
Immature Granulocytes: 0 %
Lymphocytes Relative: 24 %
Lymphs Abs: 2.2 10*3/uL (ref 0.7–4.0)
MCH: 30.3 pg (ref 26.0–34.0)
MCHC: 31.5 g/dL (ref 30.0–36.0)
MCV: 96 fL (ref 80.0–100.0)
Monocytes Absolute: 0.7 10*3/uL (ref 0.1–1.0)
Monocytes Relative: 7 %
Neutro Abs: 5.9 10*3/uL (ref 1.7–7.7)
Neutrophils Relative %: 66 %
Platelets: 306 10*3/uL (ref 150–400)
RBC: 4.46 MIL/uL (ref 3.87–5.11)
RDW: 13.1 % (ref 11.5–15.5)
WBC: 8.9 10*3/uL (ref 4.0–10.5)
nRBC: 0 % (ref 0.0–0.2)

## 2023-02-16 LAB — VITAMIN D 25 HYDROXY (VIT D DEFICIENCY, FRACTURES): Vit D, 25-Hydroxy: 27.67 ng/mL — ABNORMAL LOW (ref 30–100)

## 2023-02-23 ENCOUNTER — Inpatient Hospital Stay (HOSPITAL_BASED_OUTPATIENT_CLINIC_OR_DEPARTMENT_OTHER): Payer: Medicare Other | Admitting: Hematology

## 2023-02-23 VITALS — BP 124/69 | HR 87 | Temp 97.8°F | Resp 16 | Ht 59.06 in | Wt 116.2 lb

## 2023-02-23 DIAGNOSIS — Z85118 Personal history of other malignant neoplasm of bronchus and lung: Secondary | ICD-10-CM | POA: Diagnosis not present

## 2023-02-23 DIAGNOSIS — R918 Other nonspecific abnormal finding of lung field: Secondary | ICD-10-CM | POA: Diagnosis not present

## 2023-02-23 DIAGNOSIS — Z9012 Acquired absence of left breast and nipple: Secondary | ICD-10-CM | POA: Diagnosis not present

## 2023-02-23 DIAGNOSIS — C50912 Malignant neoplasm of unspecified site of left female breast: Secondary | ICD-10-CM

## 2023-02-23 DIAGNOSIS — Z171 Estrogen receptor negative status [ER-]: Secondary | ICD-10-CM | POA: Diagnosis not present

## 2023-02-23 DIAGNOSIS — M810 Age-related osteoporosis without current pathological fracture: Secondary | ICD-10-CM | POA: Diagnosis not present

## 2023-02-23 DIAGNOSIS — Z923 Personal history of irradiation: Secondary | ICD-10-CM | POA: Diagnosis not present

## 2023-02-23 DIAGNOSIS — Z853 Personal history of malignant neoplasm of breast: Secondary | ICD-10-CM | POA: Diagnosis not present

## 2023-02-23 DIAGNOSIS — F17210 Nicotine dependence, cigarettes, uncomplicated: Secondary | ICD-10-CM | POA: Diagnosis not present

## 2023-02-23 DIAGNOSIS — Z1231 Encounter for screening mammogram for malignant neoplasm of breast: Secondary | ICD-10-CM

## 2023-02-23 NOTE — Patient Instructions (Addendum)
 [facility] at [facility] Discharge Instructions   You were seen and examined today by Dr. Cheree Cords.  He reviewed the results of your lab work which are normal/stable.   He reviewed the results of your CT scan which is stable. There are no new areas of concern, and the nodules that are there have not grown or changed in size.   We will see you back in 6 months.   Return as scheduled.    Thank you for choosing [facility] at [facility] to provide your oncology and hematology care.  To afford each patient quality time with our provider, please arrive at least 15 minutes before your scheduled appointment time.   If you have a lab appointment with the Cancer Center please come in thru the Main Entrance and check in at the main information desk.  You need to re-schedule your appointment should you arrive 10 or more minutes late.  We strive to give you quality time with our providers, and arriving late affects you and other patients whose appointments are after yours.  Also, if you no show three or more times for appointments you may be dismissed from the clinic at the providers discretion.     Again, thank you for choosing [facility].  Our hope is that these requests will decrease the amount of time that you wait before being seen by our physicians.       _____________________________________________________________  Should you have questions after your visit to [facility], please contact our office at [phone] and follow the prompts.  Our office hours are 8:00 a.m. and 4:30 p.m. Monday - Friday.  Please note that voicemails left after 4:00 p.m. may not be returned until the following business day.  We are closed weekends and major holidays.  You do have access to a nurse 24-7, just call the main number to the clinic [phone] and do not press any options, hold on the line and a nurse will answer the phone.     For prescription refill requests, have your pharmacy contact our office and allow 72 hours.    Due to Covid, you will need to wear a mask upon entering the hospital. If you do not have a mask, a mask will be given to you at the Main Entrance upon arrival. For doctor visits, patients may have 1 support person age 77 or older with them. For treatment visits, patients can not have anyone with them due to social distancing guidelines and our immunocompromised population.

## 2023-02-23 NOTE — Progress Notes (Signed)
 [facility] [address], [place] [address]    Clinic Day:  02/23/2023  Referring physician: Kathyleen Parkins, MD  Patient Care Team: Kathyleen Parkins, MD as PCP - General (Internal Medicine) Alyce Jubilee, MD (Inactive) as Consulting Physician (Gastroenterology) Vinetta Greening, DO as Consulting Physician (Internal Medicine)   ASSESSMENT & PLAN:   Assessment:  1.  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 Larrie Po on 01/16/2017 showing a 1.8 cm primary tumor, grade 3, LVI positive, clear surgical margins, with high-grade DCIS.  Lymph nodes cannot be removed due to prior history of left breast cancer treatment. -PET scan on 03/27/2017 showed mildly hypermetabolic small prevascular lymph node and right lower peritracheal lymph node indeterminate.  Moderate metabolic activity associated with ill defined left upper lobe nodule. -PET scan on 08/15/2017 showed mild reduction in size and activity of the left and left upper lobe pulmonary nodules, favoring benign etiology.  Stable small prevascular lymph nodes and reduced size and activity of the lower pretracheal lymph node.  Overall suggest reactive/inflammatory etiology. -Adjuvant chemotherapy was not recommended as more than 7 months have elapsed since her surgery. -She reports mild tenderness at the mastectomy site which is stable.  She also reports tightness at times. -She was given a referral to remove her Port-A-Cath at her last visit. -Last mammogram done on 09/02/2019 was BI-RADS Category 1 negative.     2.  History of node positive left breast cancer: -Diagnosed on 02/16/2001, T1 CN1M0, stage IIa, ER/PR positive, HER-2 negative, 1.5 cm, 1 out of 6 lymph nodes positive.   3.  PET positive left lung nodule: - SBRT to the left lung lesion from 04/28/2021 through 05/01/2021, 5 fractions, 60 Gray  Plan:  1.  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  Right breast has no palpable masses or adenopathy. - Recommend right breast screening mammogram.   2.   PET positive left lung nodule: - She continues to complain of pain in the left lateral chest wall in the infra axillary area and left shoulder with decreased range of motion, likely from previous surgery and radiation. - Reviewed CT chest from 02/16/2023: Postradiation pleuroparenchymal scarring in the lingula, left lower lobe with no evidence of disease progression.  Small pericardial effusion.  Pulmonary nodule stable in size.  Other benign findings like hiatal hernia, left renal stone were discussed with the patient. - Recommend follow-up in 6 months with repeat CT of the chest without contrast.   3.  Osteoporosis: - She has multiple healing lower anterolateral left rib fractures which she may have sustained when she fell a year ago.  She will talk to Dr. Del Favia about options for osteoporosis.     Breast Cancer therapy associated bone loss: I have recommended calcium , Vitamin D  and weight bearing exercises.  Orders Placed This Encounter  Procedures   CT Chest Wo Contrast    Standing Status:   Future    Expected Date:   08/23/2023    Expiration Date:   02/23/2024    Preferred imaging location?:   [facility]   MM 3D SCREENING MAMMOGRAM UNILATERAL RIGHT BREAST    Standing Status:   Future    Expected Date:   03/09/2023    Expiration Date:   02/23/2024    Reason for Exam (SYMPTOM  OR DIAGNOSIS REQUIRED):   breast cancer screening  Preferred imaging location?:   [facility]   CBC with Differential    Standing Status:   Future    Expected Date:   08/17/2023    Expiration Date:   02/23/2024   Comprehensive metabolic panel    Standing Status:   Future    Expected Date:   08/17/2023    Expiration Date:   02/23/2024   VITAMIN D  25 Hydroxy (Vit-D Deficiency, Fractures)    Standing Status:   Future    Expected Date:   08/17/2023     Expiration Date:   02/23/2024       [name],acting as a scribe for [name], MD.,have documented all relevant documentation on the behalf of [name], MD,as directed by  [name], MD while in the presence of [name], MD.  I, [name] MD, have reviewed the above documentation for accuracy and completeness, and I agree with the above.    [name], MD   2/10/20253:51 PM  CHIEF COMPLAINT:   Diagnosis: Malignant neoplasm of left breast in female, estrogen receptor negative,    Cancer Staging  History of left breast cancer (2003) Staging form: Breast, AJCC 7th Edition - Clinical: Stage IIA (T1c, N1, cM0) - Signed by Doretta Gant, PA-C on 11/23/2012  Malignant neoplasm of left female breast [facility]) Staging form: Breast, AJCC 8th Edition - Pathologic stage from 01/19/2017: Stage Unknown (pT1c, pNX, cM0, G3, ER-, PR-, HER2-) - Signed by Doretta Gant, PA-C on 02/27/2017    Prior Therapy: Left simple mastectomy on 01/16/17  Current Therapy: Observation   HISTORY OF PRESENT ILLNESS:   Oncology History  Malignant neoplasm of left female breast (HCC)  08/21/2016 Pathology Results   Left needle core biopsy, lower-inner quadrant: invasive ductal carcinoma and DCIS.  ER/PR-, HER2-.    01/16/2017 Surgery   Left simple mastectomy by Dr. Drucella Georgi ductal carcinoma, grade 3, measuring 1.8 cm with DCIS (high-grade).  Lymphovascular invasion is identified.  Clear surgical margins.      INTERVAL HISTORY:   Margaret Thompson is a 89 y.o. female presenting to clinic today for follow up of left breast and lung cancer. She was last seen by me on 08/18/22.  Since her last visit, she underwent a chest CT on 02/16/23 that found: Post radiation pleuroparenchymal scarring in the lingula and left lower lobe. No evidence of disease progression. Small pericardial effusion. Pulmonary nodules measure 5 mm or less in size, stable. Basilar  predominant subpleural reticular densities, ground-glass and traction bronchiectasis/bronchiolectasis, findings indicative of interstitial lung disease such as usual interstitial pneumonitis. Air-filled and dilated esophagus, indicative of dysmotility. Small hiatal hernia with persistent thickening of the proximal gastric wall. Tiny left renal stone.  Today, she states that she is doing well overall. Her appetite level is at 100%. Her energy level is at 25%.   PAST MEDICAL HISTORY:   Past Medical History: Past Medical History:  Diagnosis Date   Acute MI (HCC) 2003   Adenocarcinoma of left breast (HCC) 11/23/2012   Stage II (T1 C. N1 M0) adenocarcinoma of the left breast with surgery on 03/08/2001. She had a 1.5 cm cancer with 1/6 positive sentinel nodes. Reexcision margins were clear. She did participate in NSABP B.-30 randomized to Adriamycin and Taxotere for 4 cycles followed by radiation therapy from 07/13/2001 to 08/27/2001. She then started Arimidex on 07/07/2001 took that until the end of June 2008. T   Anemia    Bilateral cataracts    Breast cancer (HCC)  GERD (gastroesophageal reflux disease)    History of gout    History of radiation therapy    Left lung- 04/18/21-05/01/21- Dr. Retta Caster   Hypercholesteremia    Hypertension    Malignant neoplasm of lower-inner quadrant of left female breast [facility])    Osteoporosis     Surgical History: Past Surgical History:  Procedure Laterality Date   ABDOMINAL HYSTERECTOMY     BREAST SURGERY     CATARACT EXTRACTION W/PHACO Left 09/04/2014   Procedure: CATARACT EXTRACTION PHACO AND INTRAOCULAR LENS PLACEMENT (IOC);  Surgeon: Anner Kill, MD;  Location: [facility]S;  Service: Ophthalmology;  Laterality: Left;  CDE:9.84   CATARACT EXTRACTION W/PHACO Right 11/09/2014   Procedure: CATARACT EXTRACTION PHACO AND INTRAOCULAR LENS PLACEMENT (IOC);  Surgeon: Anner Kill, MD;  Location: [facility];  Service: Ophthalmology;  Laterality: Right;  CDE:8.40    CORONARY ANGIOPLASTY     RCA stent   ESOPHAGOGASTRODUODENOSCOPY (EGD) WITH PROPOFOL  N/A 08/04/2017   Fields: esophagitis, gastritis, no h.pylori. medium sized hiatal hernia, moderate schatzki ring s/p dilation   LEFT HEART CATHETERIZATION WITH CORONARY ANGIOGRAM N/A 12/21/2012   Procedure: LEFT HEART CATHETERIZATION WITH CORONARY ANGIOGRAM;  Surgeon: Sharene Dauer, MD;  Location: [facility] CATH [facility];  Service: Cardiovascular;  Laterality: N/A;   MASTECTOMY MODIFIED RADICAL Left 01/16/2017   Procedure: SIMPLE MASTECTOMY;  Surgeon: Alanda Allegra, MD;  Location: [facility];  Service: General;  Laterality: Left;   MASTECTOMY, PARTIAL Left    PERCUTANEOUS STENT INTERVENTION     RCA   PORT-A-CATH REMOVAL     PORT-A-CATH REMOVAL Right 09/13/2018   Procedure: REMOVAL PORT-A-CATH;  Surgeon: Alanda Allegra, MD;  Location: [facility];  Service: General;  Laterality: Right;   PORTACATH PLACEMENT     PORTACATH PLACEMENT Right 03/11/2017   Procedure: INSERTION PORT-A-CATH;  Surgeon: Alanda Allegra, MD;  Location: [facility];  Service: General;  Laterality: Right;   SAVORY DILATION N/A 08/04/2017   Procedure: SAVORY DILATION;  Surgeon: Alyce Jubilee, MD;  Location: [facility] ENDO SU[facility];  Service: Endoscopy;  Laterality: N/A;    Social History: Social History   Socioeconomic History   Marital status: Widowed    Spouse name: Not on file   Number of children: 2   Years of education: 26   Highest education level: 12th grade  Occupational History   Not on file  Tobacco Use   Smoking status: Every Day    Current packs/day: 0.50    Average packs/day: 0.5 packs/day for 50.0 years (25.0 ttl pk-yrs)    Types: Cigarettes    Passive exposure: Current   Smokeless tobacco: Never   Tobacco comments:    10 cigarettes daily    Smoking Cessation Classes, Services & Resources Offered.  Vaping Use   Vaping status: Never Used  Substance and Sexual Activity   Alcohol use: No   Drug use: No   Sexual activity: Not Currently    Partners:  Male    Birth control/protection: Surgical  Other Topics Concern   Not on file  Social History Narrative   Not on file   Social Drivers of Health   Financial Resource Strain: Low Risk  (04/15/2022)   Overall Financial Resource Strain (CARDIA)    Difficulty of Paying Living Expenses: Not hard at all  Food Insecurity: No Food Insecurity (04/15/2022)   Hunger Vital Sign    Worried About Running Out of Food in the Last Year: Never true    Ran Out of Food in the Last Year: Never true  Transportation Needs: No Transportation Needs (04/15/2022)   PRAPARE - Administrator, Civil Service (Medical): No    Lack of Transportation (Non-Medical): No  Physical Activity: Sufficiently Active (04/15/2022)   Exercise Vital Sign    Days of Exercise per Week: 5 days    Minutes of Exercise per Session: 30 min  Stress: No Stress Concern Present (04/15/2022)   [employer] of Occupational Health - Occupational Stress Questionnaire    Feeling of Stress : Not at all  Social Connections: Moderately Integrated (04/15/2022)   Social Connection and Isolation Panel [NHANES]    Frequency of Communication with Friends and Family: More than three times a week    Frequency of Social Gatherings with Friends and Family: More than three times a week    Attends Religious Services: More than 4 times per year    Active Member of [employer] or Organizations: Yes    Attends Banker Meetings: More than 4 times per year    Marital Status: Widowed  Intimate Partner Violence: Not At Risk (04/15/2022)   Humiliation, Afraid, Rape, and Kick questionnaire    Fear of Current or Ex-Partner: No    Emotionally Abused: No    Physically Abused: No    Sexually Abused: No    Family History: Family History  Problem Relation Age of Onset   Stroke Mother    Colon cancer Neg Hx    Gastric cancer Neg Hx    Esophageal cancer Neg Hx     Current Medications:  Current Outpatient Medications:    albuterol  (VENTOLIN  HFA)  108 (90 Base) MCG/ACT inhaler, Inhale 2 puffs into the lungs every 4 (four) hours. (Patient not taking: Reported on 02/23/2023), Disp: , Rfl:    aspirin  EC 81 MG tablet, Take 81 mg by mouth daily., Disp: , Rfl:    atorvastatin  (LIPITOR) 40 MG tablet, Take 40 mg by mouth daily., Disp: , Rfl:    diltiazem  (CARDIZEM  CD) 180 MG 24 hr capsule, Take 180 mg by mouth daily., Disp: , Rfl:    gabapentin  (NEURONTIN ) 100 MG capsule, Take 3 capsules (300 mg total) by mouth at bedtime. (Patient not taking: Reported on 02/23/2023), Disp: 30 capsule, Rfl: 3   triamcinolone  ointment (KENALOG ) 0.1 %, Apply 1 Application topically 2 (two) times daily. Apply sparingly twice daily as needed for itching.  Do not use for more than 14 days in a row. (Patient not taking: Reported on 02/23/2023), Disp: 15 g, Rfl: 0   Allergies: Allergies  Allergen Reactions   Azithromycin Anaphylaxis and Hives   Ivp Dye [Iodinated Contrast Media] Hives    CT    Cipro [Ciprofloxacin Hcl]    Gatifloxacin    Levaquin  [Levofloxacin ]     REVIEW OF SYSTEMS:   Review of Systems  Constitutional:  Positive for fatigue. Negative for chills and fever.  HENT:   Negative for lump/mass, mouth sores, nosebleeds, sore throat and trouble swallowing.   Eyes:  Negative for eye problems.  Respiratory:  Positive for cough. Negative for shortness of breath.   Cardiovascular:  Negative for chest pain, leg swelling and palpitations.  Gastrointestinal:  Negative for abdominal pain, constipation, diarrhea, nausea and vomiting.  Genitourinary:  Negative for bladder incontinence, difficulty urinating, dysuria, frequency, hematuria and nocturia.   Musculoskeletal:  Negative for arthralgias, back pain, flank pain, myalgias and neck pain.  Skin:  Negative for itching and rash.  Neurological:  Negative for dizziness, headaches and numbness.  Hematological:  Does not bruise/bleed easily.  Psychiatric/Behavioral:  Positive for sleep disturbance. Negative for  depression and suicidal ideas. The patient is not nervous/anxious.   All other systems reviewed and are negative.    VITALS:   Blood pressure 124/69, pulse 87, temperature 97.8 F (36.6 C), temperature source Oral, resp. rate 16, height 4' 11.06" (1.5 m), weight 116 lb 2.9 oz (52.7 kg), SpO2 94%.  Wt Readings from Last 3 Encounters:  02/23/23 116 lb 2.9 oz (52.7 kg)  08/18/22 113 lb 12.8 oz (51.6 kg)  02/17/22 114 lb 4.8 oz (51.8 kg)    Body mass index is 23.42 kg/m.  Performance status (ECOG): 1 - Symptomatic but completely ambulatory  PHYSICAL EXAM:   Physical Exam Vitals and nursing note reviewed. Exam conducted with a chaperone present.  Constitutional:      Appearance: Normal appearance.  Cardiovascular:     Rate and Rhythm: Normal rate and regular rhythm.     Pulses: Normal pulses.     Heart sounds: Normal heart sounds.  Pulmonary:     Effort: Pulmonary effort is normal.     Breath sounds: Normal breath sounds.  Abdominal:     Palpations: Abdomen is soft. There is no hepatomegaly, splenomegaly or mass.     Tenderness: There is no abdominal tenderness.  Musculoskeletal:     Right lower leg: No edema.     Left lower leg: No edema.  Lymphadenopathy:     Cervical: No cervical adenopathy.     Right cervical: No superficial, deep or posterior cervical adenopathy.    Left cervical: No superficial, deep or posterior cervical adenopathy.     Upper Body:     Right upper body: No supraclavicular or axillary adenopathy.     Left upper body: No supraclavicular or axillary adenopathy.  Neurological:     General: No focal deficit present.     Mental Status: She is alert and oriented to person, place, and time.  Psychiatric:        Mood and Affect: Mood normal.        Behavior: Behavior normal.     LABS:      Latest Ref Rng & Units 02/16/2023    1:18 PM 08/11/2022    2:39 PM 05/21/2022   12:30 PM  CBC  WBC 4.0 - 10.5 K/uL 8.9  4.1  7.8   Hemoglobin 12.0 - 15.0 g/dL 16.1   09.6  04.5   Hematocrit 36.0 - 46.0 % 42.8  40.4  39.7   Platelets 150 - 400 K/uL 306  293  287       Latest Ref Rng & Units 02/16/2023    1:18 PM 08/11/2022    2:39 PM 05/21/2022   12:30 PM  CMP  Glucose 70 - 99 mg/dL 409  811  914   BUN 8 - 23 mg/dL 9  9  15    Creatinine 0.44 - 1.00 mg/dL 7.82  9.56  2.13   Sodium 135 - 145 mmol/L 139  134  140   Potassium 3.5 - 5.1 mmol/L 3.7  3.5  4.2   Chloride 98 - 111 mmol/L 103  103  101   CO2 22 - 32 mmol/L 25  22  22    Calcium  8.9 - 10.3 mg/dL 9.4  9.7  9.8   Total Protein 6.5 - 8.1 g/dL 7.8  8.0  7.5   Total Bilirubin 0.0 - 1.2 mg/dL 0.6  0.7  0.4   Alkaline Phos 38 - 126 U/L 81  78  95  AST 15 - 41 U/L 23  22  19    ALT 0 - 44 U/L 20  16  14       No results found for: "CEA1", "CEA" / No results found for: "CEA1", "CEA" No results found for: "PSA1" No results found for: "ZOX096" No results found for: "CAN125"  No results found for: "TOTALPROTELP", "ALBUMINELP", "A1GS", "A2GS", "BETS", "BETA2SER", "GAMS", "MSPIKE", "SPEI" No results found for: "TIBC", "FERRITIN", "IRONPCTSAT" Lab Results  Component Value Date   LDH 129 11/21/2020   LDH 129 03/01/2020   LDH 131 09/05/2019     STUDIES:   CT Chest Wo Contrast Result Date: 02/23/2023 CLINICAL DATA:  Non-small cell lung cancer. Left breast cancer. * Tracking Code: BO * EXAM: CT CHEST WITHOUT CONTRAST TECHNIQUE: Multidetector CT imaging of the chest was performed following the standard protocol without IV contrast. RADIATION DOSE REDUCTION: This exam was performed according to the departmental dose-optimization program which includes automated exposure control, adjustment of the mA and/or kV according to patient size and/or use of iterative reconstruction technique. COMPARISON:  08/11/2022. FINDINGS: Cardiovascular: Atherosclerotic calcification of the aorta, aortic valve and coronary arteries. Heart is enlarged. Small pericardial effusion. Mediastinum/Nodes: Mediastinal lymph nodes measure  up to 11 mm in the low right paratracheal station, unchanged. Hilar regions are difficult to definitively evaluate without IV contrast. No axillary adenopathy. Air-filled dilated esophagus. Lungs/Pleura: Centrilobular and paraseptal emphysema. 4 mm right upper lobe nodule (3/35), stable. 5 mm subpleural right lower lobe nodule (3/93), stable. Basilar predominant subpleural reticular densities, ground-glass and traction bronchiectasis/bronchiolectasis. Pleuroparenchymal scarring in the lingula and left lower lobe, stable. Trace pleural fluid or pleural thickening in the medial lower left hemithorax. Airway is unremarkable. Upper Abdomen: Tiny left renal stone. Probable small cyst or hemangioma in the spleen. Small hiatal hernia with persistent thickening of the proximal gastric wall. Visualized portions of the liver, adrenal glands, kidneys, spleen, pancreas, stomach and bowel are otherwise grossly unremarkable. No upper abdominal adenopathy. Musculoskeletal: Osteopenia. Degenerative changes in the spine. Multiple healing lower anterolateral left rib fractures. Old T9 compression fracture. IMPRESSION: 1. Post radiation pleuroparenchymal scarring in the lingula and left lower lobe. No evidence of disease progression. 2. Small pericardial effusion. 3. Pulmonary nodules measure 5 mm or less in size, stable. Recommend continued attention on follow-up. 4. Basilar predominant subpleural reticular densities, ground-glass and traction bronchiectasis/bronchiolectasis, findings indicative of interstitial lung disease such as usual interstitial pneumonitis. 5. Air-filled and dilated esophagus, indicative of dysmotility. 6. Small hiatal hernia with persistent thickening of the proximal gastric wall. Please correlate clinically. 7. Tiny left renal stone. 8. Aortic atherosclerosis (ICD10-I70.0). Coronary artery calcification. 9.  Emphysema (ICD10-J43.9). Electronically Signed   By: Shearon Denis M.D.   On: 02/23/2023 09:29

## 2023-02-26 DIAGNOSIS — H61001 Unspecified perichondritis of right external ear: Secondary | ICD-10-CM | POA: Diagnosis not present

## 2023-02-26 DIAGNOSIS — K130 Diseases of lips: Secondary | ICD-10-CM | POA: Diagnosis not present

## 2023-03-04 ENCOUNTER — Ambulatory Visit (HOSPITAL_COMMUNITY): Payer: Medicare Other

## 2023-03-11 ENCOUNTER — Ambulatory Visit (HOSPITAL_COMMUNITY)
Admission: RE | Admit: 2023-03-11 | Discharge: 2023-03-11 | Disposition: A | Discharge status: Home / Self Care | Payer: Medicare Other | Source: Ambulatory Visit | Attending: Hematology | Admitting: Hematology

## 2023-03-11 DIAGNOSIS — C50912 Malignant neoplasm of unspecified site of left female breast: Secondary | ICD-10-CM

## 2023-03-11 DIAGNOSIS — Z171 Estrogen receptor negative status [ER-]: Secondary | ICD-10-CM | POA: Insufficient documentation

## 2023-03-11 DIAGNOSIS — Z1231 Encounter for screening mammogram for malignant neoplasm of breast: Secondary | ICD-10-CM

## 2023-03-17 ENCOUNTER — Emergency Department (HOSPITAL_COMMUNITY)

## 2023-03-17 ENCOUNTER — Other Ambulatory Visit: Payer: Self-pay

## 2023-03-17 ENCOUNTER — Encounter (HOSPITAL_COMMUNITY): Payer: Self-pay

## 2023-03-17 ENCOUNTER — Emergency Department (HOSPITAL_COMMUNITY)
Admission: EM | Admit: 2023-03-17 | Discharge: 2023-03-17 | Discharge status: Against Medical Advice | Source: Home / Self Care

## 2023-03-17 DIAGNOSIS — M810 Age-related osteoporosis without current pathological fracture: Secondary | ICD-10-CM | POA: Diagnosis not present

## 2023-03-17 DIAGNOSIS — Z9012 Acquired absence of left breast and nipple: Secondary | ICD-10-CM | POA: Diagnosis not present

## 2023-03-17 DIAGNOSIS — Z923 Personal history of irradiation: Secondary | ICD-10-CM | POA: Diagnosis not present

## 2023-03-17 DIAGNOSIS — R0781 Pleurodynia: Secondary | ICD-10-CM | POA: Insufficient documentation

## 2023-03-17 DIAGNOSIS — J8410 Pulmonary fibrosis, unspecified: Secondary | ICD-10-CM | POA: Diagnosis not present

## 2023-03-17 DIAGNOSIS — J439 Emphysema, unspecified: Secondary | ICD-10-CM | POA: Diagnosis not present

## 2023-03-17 DIAGNOSIS — J189 Pneumonia, unspecified organism: Secondary | ICD-10-CM | POA: Diagnosis not present

## 2023-03-17 DIAGNOSIS — K219 Gastro-esophageal reflux disease without esophagitis: Secondary | ICD-10-CM | POA: Diagnosis not present

## 2023-03-17 DIAGNOSIS — E860 Dehydration: Secondary | ICD-10-CM | POA: Diagnosis not present

## 2023-03-17 DIAGNOSIS — Z961 Presence of intraocular lens: Secondary | ICD-10-CM | POA: Diagnosis not present

## 2023-03-17 DIAGNOSIS — R918 Other nonspecific abnormal finding of lung field: Secondary | ICD-10-CM | POA: Diagnosis not present

## 2023-03-17 DIAGNOSIS — Z5321 Procedure and treatment not carried out due to patient leaving prior to being seen by health care provider: Secondary | ICD-10-CM | POA: Insufficient documentation

## 2023-03-17 DIAGNOSIS — R000 Tachycardia, unspecified: Secondary | ICD-10-CM | POA: Diagnosis not present

## 2023-03-17 DIAGNOSIS — J440 Chronic obstructive pulmonary disease with acute lower respiratory infection: Secondary | ICD-10-CM | POA: Diagnosis not present

## 2023-03-17 DIAGNOSIS — R059 Cough, unspecified: Secondary | ICD-10-CM | POA: Insufficient documentation

## 2023-03-17 DIAGNOSIS — Z9842 Cataract extraction status, left eye: Secondary | ICD-10-CM | POA: Diagnosis not present

## 2023-03-17 DIAGNOSIS — Z853 Personal history of malignant neoplasm of breast: Secondary | ICD-10-CM | POA: Diagnosis not present

## 2023-03-17 DIAGNOSIS — I10 Essential (primary) hypertension: Secondary | ICD-10-CM | POA: Diagnosis not present

## 2023-03-17 DIAGNOSIS — R011 Cardiac murmur, unspecified: Secondary | ICD-10-CM | POA: Diagnosis not present

## 2023-03-17 DIAGNOSIS — R0602 Shortness of breath: Secondary | ICD-10-CM | POA: Diagnosis not present

## 2023-03-17 DIAGNOSIS — E876 Hypokalemia: Secondary | ICD-10-CM | POA: Diagnosis not present

## 2023-03-17 DIAGNOSIS — J984 Other disorders of lung: Secondary | ICD-10-CM | POA: Diagnosis not present

## 2023-03-17 DIAGNOSIS — E7800 Pure hypercholesterolemia, unspecified: Secondary | ICD-10-CM | POA: Diagnosis not present

## 2023-03-17 DIAGNOSIS — R0902 Hypoxemia: Secondary | ICD-10-CM | POA: Diagnosis not present

## 2023-03-17 DIAGNOSIS — A419 Sepsis, unspecified organism: Secondary | ICD-10-CM | POA: Diagnosis not present

## 2023-03-17 DIAGNOSIS — F17210 Nicotine dependence, cigarettes, uncomplicated: Secondary | ICD-10-CM | POA: Diagnosis not present

## 2023-03-17 DIAGNOSIS — J410 Simple chronic bronchitis: Secondary | ICD-10-CM | POA: Diagnosis not present

## 2023-03-17 DIAGNOSIS — I3481 Nonrheumatic mitral (valve) annulus calcification: Secondary | ICD-10-CM | POA: Diagnosis not present

## 2023-03-17 DIAGNOSIS — Z6820 Body mass index (BMI) 20.0-20.9, adult: Secondary | ICD-10-CM | POA: Diagnosis not present

## 2023-03-17 DIAGNOSIS — R6520 Severe sepsis without septic shock: Secondary | ICD-10-CM | POA: Diagnosis not present

## 2023-03-17 DIAGNOSIS — J209 Acute bronchitis, unspecified: Secondary | ICD-10-CM | POA: Diagnosis not present

## 2023-03-17 DIAGNOSIS — Z9861 Coronary angioplasty status: Secondary | ICD-10-CM | POA: Diagnosis not present

## 2023-03-17 DIAGNOSIS — Z1152 Encounter for screening for COVID-19: Secondary | ICD-10-CM | POA: Diagnosis not present

## 2023-03-17 DIAGNOSIS — I4719 Other supraventricular tachycardia: Secondary | ICD-10-CM | POA: Diagnosis not present

## 2023-03-17 DIAGNOSIS — Z9841 Cataract extraction status, right eye: Secondary | ICD-10-CM | POA: Diagnosis not present

## 2023-03-17 DIAGNOSIS — E871 Hypo-osmolality and hyponatremia: Secondary | ICD-10-CM | POA: Diagnosis not present

## 2023-03-17 DIAGNOSIS — I252 Old myocardial infarction: Secondary | ICD-10-CM | POA: Diagnosis not present

## 2023-03-17 DIAGNOSIS — E782 Mixed hyperlipidemia: Secondary | ICD-10-CM | POA: Diagnosis not present

## 2023-03-17 LAB — RESP PANEL BY RT-PCR (RSV, FLU A&B, COVID)  RVPGX2
Influenza A by PCR: NEGATIVE
Influenza B by PCR: NEGATIVE
Resp Syncytial Virus by PCR: NEGATIVE
SARS Coronavirus 2 by RT PCR: NEGATIVE

## 2023-03-17 NOTE — ED Triage Notes (Signed)
 Pt was seen at urgent care for cough and not feeling good x2 days. Pt was tested at [facility] and flu/covid were negative. UC instructed pt to come in to see if it is pneumonia. Pt also c/o of left rib pain while coughing.

## 2023-03-17 NOTE — ED Notes (Signed)
Per registration pt left the facility 

## 2023-03-18 ENCOUNTER — Emergency Department (HOSPITAL_BASED_OUTPATIENT_CLINIC_OR_DEPARTMENT_OTHER): Admitting: Radiology

## 2023-03-18 ENCOUNTER — Inpatient Hospital Stay (HOSPITAL_BASED_OUTPATIENT_CLINIC_OR_DEPARTMENT_OTHER)
Admission: EM | Admit: 2023-03-18 | Discharge: 2023-03-23 | DRG: 194 | Disposition: A | Discharge status: Home Health | Attending: Family Medicine | Admitting: Family Medicine

## 2023-03-18 DIAGNOSIS — J984 Other disorders of lung: Secondary | ICD-10-CM | POA: Diagnosis not present

## 2023-03-18 DIAGNOSIS — E871 Hypo-osmolality and hyponatremia: Secondary | ICD-10-CM | POA: Diagnosis present

## 2023-03-18 DIAGNOSIS — J8410 Pulmonary fibrosis, unspecified: Secondary | ICD-10-CM | POA: Diagnosis not present

## 2023-03-18 DIAGNOSIS — I4719 Other supraventricular tachycardia: Secondary | ICD-10-CM | POA: Diagnosis present

## 2023-03-18 DIAGNOSIS — I3481 Nonrheumatic mitral (valve) annulus calcification: Secondary | ICD-10-CM | POA: Diagnosis not present

## 2023-03-18 DIAGNOSIS — Z79899 Other long term (current) drug therapy: Secondary | ICD-10-CM

## 2023-03-18 DIAGNOSIS — J189 Pneumonia, unspecified organism: Principal | ICD-10-CM | POA: Diagnosis present

## 2023-03-18 DIAGNOSIS — E7800 Pure hypercholesterolemia, unspecified: Secondary | ICD-10-CM | POA: Diagnosis present

## 2023-03-18 DIAGNOSIS — F17210 Nicotine dependence, cigarettes, uncomplicated: Secondary | ICD-10-CM | POA: Diagnosis present

## 2023-03-18 DIAGNOSIS — Z792 Long term (current) use of antibiotics: Secondary | ICD-10-CM

## 2023-03-18 DIAGNOSIS — K219 Gastro-esophageal reflux disease without esophagitis: Secondary | ICD-10-CM | POA: Diagnosis present

## 2023-03-18 DIAGNOSIS — J410 Simple chronic bronchitis: Secondary | ICD-10-CM | POA: Diagnosis present

## 2023-03-18 DIAGNOSIS — Z9842 Cataract extraction status, left eye: Secondary | ICD-10-CM

## 2023-03-18 DIAGNOSIS — Z853 Personal history of malignant neoplasm of breast: Secondary | ICD-10-CM

## 2023-03-18 DIAGNOSIS — Z90710 Acquired absence of both cervix and uterus: Secondary | ICD-10-CM

## 2023-03-18 DIAGNOSIS — E785 Hyperlipidemia, unspecified: Secondary | ICD-10-CM | POA: Diagnosis present

## 2023-03-18 DIAGNOSIS — Z9012 Acquired absence of left breast and nipple: Secondary | ICD-10-CM

## 2023-03-18 DIAGNOSIS — E876 Hypokalemia: Secondary | ICD-10-CM | POA: Diagnosis present

## 2023-03-18 DIAGNOSIS — Z961 Presence of intraocular lens: Secondary | ICD-10-CM | POA: Diagnosis present

## 2023-03-18 DIAGNOSIS — J209 Acute bronchitis, unspecified: Secondary | ICD-10-CM | POA: Diagnosis present

## 2023-03-18 DIAGNOSIS — I10 Essential (primary) hypertension: Secondary | ICD-10-CM | POA: Diagnosis present

## 2023-03-18 DIAGNOSIS — Z1152 Encounter for screening for COVID-19: Secondary | ICD-10-CM

## 2023-03-18 DIAGNOSIS — M810 Age-related osteoporosis without current pathological fracture: Secondary | ICD-10-CM | POA: Diagnosis present

## 2023-03-18 DIAGNOSIS — Z923 Personal history of irradiation: Secondary | ICD-10-CM

## 2023-03-18 DIAGNOSIS — R918 Other nonspecific abnormal finding of lung field: Secondary | ICD-10-CM | POA: Diagnosis not present

## 2023-03-18 DIAGNOSIS — J439 Emphysema, unspecified: Secondary | ICD-10-CM | POA: Insufficient documentation

## 2023-03-18 DIAGNOSIS — Z7982 Long term (current) use of aspirin: Secondary | ICD-10-CM

## 2023-03-18 DIAGNOSIS — Z9861 Coronary angioplasty status: Secondary | ICD-10-CM

## 2023-03-18 DIAGNOSIS — Z881 Allergy status to other antibiotic agents status: Secondary | ICD-10-CM

## 2023-03-18 DIAGNOSIS — Z9841 Cataract extraction status, right eye: Secondary | ICD-10-CM

## 2023-03-18 DIAGNOSIS — I252 Old myocardial infarction: Secondary | ICD-10-CM

## 2023-03-18 DIAGNOSIS — Z91041 Radiographic dye allergy status: Secondary | ICD-10-CM

## 2023-03-18 DIAGNOSIS — R011 Cardiac murmur, unspecified: Secondary | ICD-10-CM | POA: Diagnosis present

## 2023-03-18 LAB — COMPREHENSIVE METABOLIC PANEL
ALT: 21 U/L (ref 0–44)
AST: 42 U/L — ABNORMAL HIGH (ref 15–41)
Albumin: 3.7 g/dL (ref 3.5–5.0)
Alkaline Phosphatase: 83 U/L (ref 38–126)
Anion gap: 9 (ref 5–15)
BUN: 21 mg/dL (ref 8–23)
CO2: 26 mmol/L (ref 22–32)
Calcium: 9.8 mg/dL (ref 8.9–10.3)
Chloride: 96 mmol/L — ABNORMAL LOW (ref 98–111)
Creatinine, Ser: 0.84 mg/dL (ref 0.44–1.00)
GFR, Estimated: >60 mL/min (ref >60)
Glucose, Bld: 118 mg/dL — ABNORMAL HIGH (ref 70–99)
Potassium: 3.3 mmol/L — ABNORMAL LOW (ref 3.5–5.1)
Sodium: 131 mmol/L — ABNORMAL LOW (ref 135–145)
Total Bilirubin: 0.7 mg/dL (ref 0.0–1.2)
Total Protein: 7.4 g/dL (ref 6.5–8.1)

## 2023-03-18 LAB — CBC WITH DIFFERENTIAL/PLATELET
Abs Immature Granulocytes: 0.17 10*3/uL — ABNORMAL HIGH (ref 0.00–0.07)
Basophils Absolute: 0.1 10*3/uL (ref 0.0–0.1)
Basophils Relative: 0 %
Eosinophils Absolute: 0 10*3/uL (ref 0.0–0.5)
Eosinophils Relative: 0 %
HCT: 35.9 % — ABNORMAL LOW (ref 36.0–46.0)
Hemoglobin: 12 g/dL (ref 12.0–15.0)
Immature Granulocytes: 1 %
Lymphocytes Relative: 11 %
Lymphs Abs: 2.2 10*3/uL (ref 0.7–4.0)
MCH: 30.5 pg (ref 26.0–34.0)
MCHC: 33.4 g/dL (ref 30.0–36.0)
MCV: 91.3 fL (ref 80.0–100.0)
Monocytes Absolute: 1.5 10*3/uL — ABNORMAL HIGH (ref 0.1–1.0)
Monocytes Relative: 7 %
Neutro Abs: 16.1 10*3/uL — ABNORMAL HIGH (ref 1.7–7.7)
Neutrophils Relative %: 81 %
Platelets: 331 10*3/uL (ref 150–400)
RBC: 3.93 MIL/uL (ref 3.87–5.11)
RDW: 13 % (ref 11.5–15.5)
WBC: 20 10*3/uL — ABNORMAL HIGH (ref 4.0–10.5)
nRBC: 0 % (ref 0.0–0.2)

## 2023-03-18 LAB — TROPONIN I (HIGH SENSITIVITY)
Troponin I (High Sensitivity): 13 ng/L (ref <18)
Troponin I (High Sensitivity): 14 ng/L (ref <18)

## 2023-03-18 LAB — RESP PANEL BY RT-PCR (RSV, FLU A&B, COVID)  RVPGX2
Influenza A by PCR: NEGATIVE
Influenza B by PCR: NEGATIVE
Resp Syncytial Virus by PCR: NEGATIVE
SARS Coronavirus 2 by RT PCR: NEGATIVE

## 2023-03-18 LAB — LACTIC ACID, PLASMA
Lactic Acid, Venous: 1.3 mmol/L (ref 0.5–1.9)
Lactic Acid, Venous: 1.2 mmol/L (ref 0.5–1.9)

## 2023-03-18 MED ORDER — IPRATROPIUM-ALBUTEROL 0.5-2.5 (3) MG/3ML IN SOLN: 3.0000 mL | RESPIRATORY_TRACT | Status: DC

## 2023-03-18 MED ORDER — DOXYCYCLINE HYCLATE 100 MG PO TABS
100.0000 mg | ORAL_TABLET | Freq: Once | ORAL | Status: AC
  Administered 2023-03-18: 100 mg via ORAL
  Filled 2023-03-18: qty 1

## 2023-03-18 MED ORDER — METHYLPREDNISOLONE SODIUM SUCC 125 MG IJ SOLR: 125.0000 mg | Freq: Once | INTRAMUSCULAR | Status: DC

## 2023-03-18 MED ORDER — AMOXICILLIN-POT CLAVULANATE 875-125 MG PO TABS: 1.0000 | ORAL_TABLET | Freq: Two times a day (BID) | ORAL | 0 refills | Status: DC

## 2023-03-18 MED ORDER — SODIUM CHLORIDE 0.9 % IV SOLN
1.0000 g | Freq: Once | INTRAVENOUS | Status: AC
  Administered 2023-03-18: 1 g via INTRAVENOUS
  Filled 2023-03-18: qty 10

## 2023-03-18 MED ORDER — METHYLPREDNISOLONE SODIUM SUCC 125 MG IJ SOLR
125.0000 mg | Freq: Once | INTRAMUSCULAR | Status: AC
  Administered 2023-03-18: 125 mg via INTRAVENOUS
  Filled 2023-03-18: qty 2

## 2023-03-18 MED ORDER — SODIUM CHLORIDE 0.9 % IV BOLUS
500.0000 mL | Freq: Once | INTRAVENOUS | Status: AC
  Administered 2023-03-18: 500 mL via INTRAVENOUS

## 2023-03-18 MED ORDER — IPRATROPIUM-ALBUTEROL 0.5-2.5 (3) MG/3ML IN SOLN
3.0000 mL | Freq: Once | RESPIRATORY_TRACT | Status: AC
Start: 2023-03-18 — End: 2023-03-18
  Administered 2023-03-18: 3 mL via RESPIRATORY_TRACT
  Filled 2023-03-18: qty 3

## 2023-03-18 MED ORDER — IPRATROPIUM-ALBUTEROL 0.5-2.5 (3) MG/3ML IN SOLN
3.0000 mL | Freq: Four times a day (QID) | RESPIRATORY_TRACT | Status: DC
  Administered 2023-03-18: 3 mL via RESPIRATORY_TRACT
  Filled 2023-03-18: qty 3

## 2023-03-18 MED ORDER — ACETAMINOPHEN 500 MG PO TABS
1000.0000 mg | ORAL_TABLET | Freq: Once | ORAL | Status: AC
Start: 2023-03-18 — End: 2023-03-18
  Administered 2023-03-18: 1000 mg via ORAL
  Filled 2023-03-18: qty 2

## 2023-03-18 MED ORDER — DOXYCYCLINE HYCLATE 100 MG PO CAPS: 100.0000 mg | ORAL_CAPSULE | Freq: Two times a day (BID) | ORAL | 0 refills | Status: DC

## 2023-03-18 MED ORDER — ALBUTEROL SULFATE HFA 108 (90 BASE) MCG/ACT IN AERS
2.0000 | INHALATION_SPRAY | RESPIRATORY_TRACT | Status: DC
  Filled 2023-03-18: qty 6.7

## 2023-03-18 NOTE — ED Notes (Signed)
 Patient resting with eyes closed

## 2023-03-18 NOTE — ED Provider Notes (Signed)
 [place] EMERGENCY DEPARTMENT AT [facility] Provider Note   CSN: [id number] Arrival date & time: 03/18/23  1549     History  No chief complaint on file.   Margaret Thompson is a 89 y.o. female.  Patient complains of a cough congestion and shortness of breath for the past 2 days.  Patient went to [facility] yesterday but left before being evaluated.  Patient reports cough has been worsening today.  Patient has a past medical history of a lung mass breast cancer bursitis and hypertension.  Patient took her hypertension medicine and her aspirin this a.m.  Patient denies having any fever.  Patient reports she is coughing up a lot of phlegm.  Patient is here with family member who is supportive.        Home Medications Prior to Admission medications   Medication Sig Start Date End Date Taking? Authorizing Provider  albuterol (VENTOLIN HFA) 108 (90 Base) MCG/ACT inhaler Inhale 2 puffs into the lungs every 4 (four) hours. Patient not taking: Reported on 02/23/2023 06/12/22   [provider]  aspirin EC 81 MG tablet Take 81 mg by mouth daily.    [provider]  atorvastatin (LIPITOR) 40 MG tablet Take 40 mg by mouth daily. 11/26/16   [provider]  diltiazem (CARDIZEM CD) 180 MG 24 hr capsule Take 180 mg by mouth daily. 03/09/20   [provider]  gabapentin (NEURONTIN) 100 MG capsule Take 3 capsules (300 mg total) by mouth at bedtime. Patient not taking: Reported on 02/23/2023 02/17/22   Doreatha Massed, MD  triamcinolone ointment (KENALOG) 0.1 % Apply 1 Application topically 2 (two) times daily. Apply sparingly twice daily as needed for itching.  Do not use for more than 14 days in a row. Patient not taking: Reported on 02/23/2023 06/15/22   Valentino Nose, NP      Allergies    Azithromycin, Ivp dye [iodinated contrast media], Cipro [ciprofloxacin hcl], Gatifloxacin, and Levaquin [levofloxacin]    Review of Systems   Review of Systems   All other systems reviewed and are negative.   Physical Exam Updated Vital Signs BP (!) 122/58   Pulse (!) 105   Temp 98.5 F (36.9 C)   Resp 20   SpO2 92%  Physical Exam Vitals and nursing note reviewed.  Constitutional:      Appearance: She is well-developed.  HENT:     Head: Normocephalic.     Nose: Nose normal.     Mouth/Throat:     Mouth: Mucous membranes are moist.  Cardiovascular:     Rate and Rhythm: Normal rate.  Pulmonary:     Effort: Pulmonary effort is normal.     Breath sounds: Rhonchi present.  Abdominal:     General: There is no distension.  Musculoskeletal:        General: Normal range of motion.     Cervical back: Normal range of motion.  Skin:    General: Skin is warm.  Neurological:     General: No focal deficit present.     Mental Status: She is alert and oriented to person, place, and time.  Psychiatric:        Mood and Affect: Mood normal.     ED Results / Procedures / Treatments   Labs (all labs ordered are listed, but only abnormal results are displayed) Labs Reviewed  COMPREHENSIVE METABOLIC PANEL - Abnormal; Notable for the following components:      Result Value   Sodium  131 (*)    Potassium 3.3 (*)    Chloride 96 (*)    Glucose, Bld 118 (*)    AST 42 (*)    All other components within normal limits  CBC WITH DIFFERENTIAL/PLATELET - Abnormal; Notable for the following components:   WBC 20.0 (*)    HCT 35.9 (*)    Neutro Abs 16.1 (*)    Monocytes Absolute 1.5 (*)    Abs Immature Granulocytes 0.17 (*)    All other components within normal limits  RESP PANEL BY RT-PCR (RSV, FLU A&B, COVID)  RVPGX2  LACTIC ACID, PLASMA  LACTIC ACID, PLASMA  TROPONIN I (HIGH SENSITIVITY)  TROPONIN I (HIGH SENSITIVITY)    EKG EKG Interpretation Date/Time:  Wednesday March 18 2023 16:18:32 EST Ventricular Rate:  103 PR Interval:  134 QRS Duration:  108 QT Interval:  368 QTC Calculation: 482 R Axis:   -23  Text Interpretation: Sinus  tachycardia Indeterminate axis Incomplete right bundle branch block Borderline ECG Confirmed by Vonita Moss [phone]) on 03/18/2023 4:23:58 PM  Radiology DG Chest Portable 1 View Result Date: 03/17/2023 CLINICAL DATA:  Cough and malaise for 2 days. Flu and COVID test negative at urgent care. Left rib pain with coughing. EXAM: PORTABLE CHEST 1 VIEW COMPARISON:  Chest CT 02/16/2023 FINDINGS: There is mild cardiomegaly. The aorta and mitral ring are heavily calcified. Stable mediastinum with mild aortic tortuosity. Slight central vascular prominence. Basal predominant pulmonary fibrosis is again noted. The pulmonary interstitium appears more prominent today which could indicate new interstitial pneumonitis or mild interstitial edema. No focal pneumonia is evident. Asymmetric focal scarring again noted in the lateral left base. There is osteopenia and thoracic spondylosis. No new osseous findings. Left axillary surgical clips and left mastectomy. IMPRESSION: 1. Basal predominant pulmonary fibrosis. 2. The pulmonary interstitium appears more prominent today which could indicate new interstitial pneumonitis or mild interstitial edema. 3. Aortic and mitral ring atherosclerosis. 4. Osteopenia and thoracic spondylosis. 5. Left mastectomy and left axillary surgical clips. Electronically Signed   By: Almira Bar M.D.   On: 03/17/2023 22:31    Procedures Procedures    Medications Ordered in ED Medications  cefTRIAXone (ROCEPHIN) 1 g in sodium chloride 0.9 % 100 mL IVPB (has no administration in time range)  doxycycline (VIBRA-TABS) tablet 100 mg (has no administration in time range)    ED Course/ Medical Decision Making/ A&P                                 Medical Decision Making Patient complains of cough and congestion.  Patient is a smoker.  Amount and/or Complexity of Data Reviewed Independent Historian:     Details: Patient is here with her daughter who is supportive External Data Reviewed:  notes.    Details: Labs from yesterday show flu COVID and RSV were negative. Labs: ordered. Decision-making details documented in ED Course.    Details: Labs ordered reviewed and interpreted patient has a white blood cell count of 20 lactic acid is negative. Radiology: ordered. ECG/medicine tests: ordered and independent interpretation performed. Decision-making details documented in ED Course.    Details: EKG shows tachycardia.  Risk Prescription drug management. Risk Details: Patient given Rocephin 1 g IV.  She is given doxycycline 100 mg p.o. Patient given albuterol neb.  Patient reports some relief with neb.  Patient is counseled on x-ray results.  Patient is adamantly refusing hospital admission.  She states  that she will take medications at home.  I have recommended to her that she be admitted.  But she refuses.  I will ask the patient to sign stating that she is choosing not to be admitted.  Patient's daughter is attempting to convince patient to stay in the hospital.   Patient has pneumonia and probable exacerbation of COPD.  Her O2 sats are currently 94% on room air.  Patient is given a prescription for doxycycline and an albuterol inhaler.           Final Clinical Impression(s) / ED Diagnoses Final diagnoses:  Pneumonia due to infectious organism, unspecified laterality, unspecified part of lung  Simple chronic bronchitis (HCC)    Rx / DC Orders ED Discharge Orders          Ordered    doxycycline (VIBRAMYCIN) 100 MG capsule  2 times daily        03/18/23 1916    amoxicillin-clavulanate (AUGMENTIN) 875-125 MG tablet  2 times daily        03/18/23 1921           An After Visit Summary was printed and given to the patient.    Elson Areas, [place] 03/18/23 1921    Rondel Baton, MD 03/19/23 [phone]

## 2023-03-18 NOTE — ED Provider Notes (Signed)
  Physical Exam  BP 120/65   Pulse (!) 110   Temp 98.5 F (36.9 C)   Resp 18   SpO2 95%   Physical Exam Vitals and nursing note reviewed.  Constitutional:      General: She is not in acute distress.    Appearance: She is not toxic-appearing.  HENT:     Head: Normocephalic and atraumatic.  Eyes:     General: No scleral icterus.    Conjunctiva/sclera: Conjunctivae normal.  Cardiovascular:     Rate and Rhythm: Regular rhythm. Tachycardia present.     Pulses: Normal pulses.     Heart sounds: Normal heart sounds.  Pulmonary:     Effort: Pulmonary effort is normal. No respiratory distress.     Breath sounds: Normal breath sounds.  Abdominal:     General: Abdomen is flat. Bowel sounds are normal.     Palpations: Abdomen is soft.     Tenderness: There is no abdominal tenderness.  Musculoskeletal:     Right lower leg: No edema.     Left lower leg: No edema.  Skin:    General: Skin is warm and dry.     Findings: No lesion.  Neurological:     General: No focal deficit present.     Mental Status: She is alert and oriented to person, place, and time. Mental status is at baseline.     Procedures  Procedures  ED Course / MDM    Medical Decision Making Amount and/or Complexity of Data Reviewed Labs: ordered. Radiology: ordered.  Risk OTC drugs. Prescription drug management. Decision regarding hospitalization.   Patient needs admitted for pneumonia.  Patient was signed off from [employer].  Patient meeting sepsis criteria on arrival to emergency room with tachycardia increased respiratory rate and white count.  Source is pneumonia.  This has been ongoing for 4 days and associated with productive cough.  She has self reported COPD and is an active smoker approximately 1 pack/day. She has some shortness of breath and wheezing/ rhonchi on exam. No confusion.    Patient has leukocytosis of 20.  She also has pneumonia.  Given ceftriaxone and doxycycline.    I have called  admitting team who agree to admit. Added on blood cultures as well since she is meeting sepsis criteria.       Reinaldo Raddle 03/18/23 2009    Rondel Baton, MD 03/19/23 [phone]

## 2023-03-18 NOTE — Discharge Instructions (Addendum)
 Return if you change your mind about being admitted.  Your symptoms may worsen.  Take the antibiotic as directed. Use the inhaler 2 puffs every 4 hours.

## 2023-03-18 NOTE — ED Notes (Signed)
 Pt agreeable to be admitted at this time.

## 2023-03-18 NOTE — ED Triage Notes (Signed)
 Flu symptoms several days. Painful left sided cough.   UC last night- sent to [facility]- left early.

## 2023-03-19 ENCOUNTER — Encounter (HOSPITAL_COMMUNITY): Payer: Self-pay | Admitting: Internal Medicine

## 2023-03-19 DIAGNOSIS — R0602 Shortness of breath: Secondary | ICD-10-CM | POA: Diagnosis not present

## 2023-03-19 DIAGNOSIS — E871 Hypo-osmolality and hyponatremia: Secondary | ICD-10-CM | POA: Diagnosis present

## 2023-03-19 DIAGNOSIS — E782 Mixed hyperlipidemia: Secondary | ICD-10-CM | POA: Diagnosis not present

## 2023-03-19 DIAGNOSIS — R011 Cardiac murmur, unspecified: Secondary | ICD-10-CM | POA: Diagnosis not present

## 2023-03-19 DIAGNOSIS — F17210 Nicotine dependence, cigarettes, uncomplicated: Secondary | ICD-10-CM | POA: Diagnosis present

## 2023-03-19 DIAGNOSIS — Z961 Presence of intraocular lens: Secondary | ICD-10-CM | POA: Diagnosis present

## 2023-03-19 DIAGNOSIS — M810 Age-related osteoporosis without current pathological fracture: Secondary | ICD-10-CM | POA: Diagnosis present

## 2023-03-19 DIAGNOSIS — J440 Chronic obstructive pulmonary disease with acute lower respiratory infection: Secondary | ICD-10-CM | POA: Diagnosis not present

## 2023-03-19 DIAGNOSIS — J209 Acute bronchitis, unspecified: Secondary | ICD-10-CM | POA: Diagnosis not present

## 2023-03-19 DIAGNOSIS — R6520 Severe sepsis without septic shock: Secondary | ICD-10-CM | POA: Diagnosis not present

## 2023-03-19 DIAGNOSIS — Z923 Personal history of irradiation: Secondary | ICD-10-CM | POA: Diagnosis not present

## 2023-03-19 DIAGNOSIS — I10 Essential (primary) hypertension: Secondary | ICD-10-CM | POA: Diagnosis present

## 2023-03-19 DIAGNOSIS — E876 Hypokalemia: Secondary | ICD-10-CM | POA: Diagnosis not present

## 2023-03-19 DIAGNOSIS — K219 Gastro-esophageal reflux disease without esophagitis: Secondary | ICD-10-CM | POA: Diagnosis present

## 2023-03-19 DIAGNOSIS — J8410 Pulmonary fibrosis, unspecified: Secondary | ICD-10-CM | POA: Diagnosis not present

## 2023-03-19 DIAGNOSIS — J439 Emphysema, unspecified: Secondary | ICD-10-CM | POA: Diagnosis not present

## 2023-03-19 DIAGNOSIS — J189 Pneumonia, unspecified organism: Secondary | ICD-10-CM | POA: Diagnosis present

## 2023-03-19 DIAGNOSIS — J410 Simple chronic bronchitis: Secondary | ICD-10-CM | POA: Diagnosis not present

## 2023-03-19 DIAGNOSIS — Z1152 Encounter for screening for COVID-19: Secondary | ICD-10-CM | POA: Diagnosis not present

## 2023-03-19 DIAGNOSIS — Z9841 Cataract extraction status, right eye: Secondary | ICD-10-CM | POA: Diagnosis not present

## 2023-03-19 DIAGNOSIS — Z90710 Acquired absence of both cervix and uterus: Secondary | ICD-10-CM | POA: Diagnosis not present

## 2023-03-19 DIAGNOSIS — A419 Sepsis, unspecified organism: Secondary | ICD-10-CM

## 2023-03-19 DIAGNOSIS — Z853 Personal history of malignant neoplasm of breast: Secondary | ICD-10-CM | POA: Diagnosis not present

## 2023-03-19 DIAGNOSIS — E7800 Pure hypercholesterolemia, unspecified: Secondary | ICD-10-CM | POA: Diagnosis not present

## 2023-03-19 DIAGNOSIS — Z9842 Cataract extraction status, left eye: Secondary | ICD-10-CM | POA: Diagnosis not present

## 2023-03-19 DIAGNOSIS — Z9012 Acquired absence of left breast and nipple: Secondary | ICD-10-CM | POA: Diagnosis not present

## 2023-03-19 DIAGNOSIS — E785 Hyperlipidemia, unspecified: Secondary | ICD-10-CM | POA: Diagnosis present

## 2023-03-19 DIAGNOSIS — Z9861 Coronary angioplasty status: Secondary | ICD-10-CM | POA: Diagnosis not present

## 2023-03-19 DIAGNOSIS — I4719 Other supraventricular tachycardia: Secondary | ICD-10-CM | POA: Diagnosis not present

## 2023-03-19 DIAGNOSIS — I252 Old myocardial infarction: Secondary | ICD-10-CM | POA: Diagnosis not present

## 2023-03-19 LAB — CBC WITH DIFFERENTIAL/PLATELET
Abs Immature Granulocytes: 0.22 10*3/uL — ABNORMAL HIGH (ref 0.00–0.07)
Basophils Absolute: 0.1 10*3/uL (ref 0.0–0.1)
Basophils Relative: 0 %
Eosinophils Absolute: 0 10*3/uL (ref 0.0–0.5)
Eosinophils Relative: 0 %
HCT: 34.2 % — ABNORMAL LOW (ref 36.0–46.0)
Hemoglobin: 11 g/dL — ABNORMAL LOW (ref 12.0–15.0)
Immature Granulocytes: 1 %
Lymphocytes Relative: 4 %
Lymphs Abs: 0.7 10*3/uL (ref 0.7–4.0)
MCH: 30.3 pg (ref 26.0–34.0)
MCHC: 32.2 g/dL (ref 30.0–36.0)
MCV: 94.2 fL (ref 80.0–100.0)
Monocytes Absolute: 0.3 10*3/uL (ref 0.1–1.0)
Monocytes Relative: 2 %
Neutro Abs: 16.5 10*3/uL — ABNORMAL HIGH (ref 1.7–7.7)
Neutrophils Relative %: 93 %
Platelets: 295 10*3/uL (ref 150–400)
RBC: 3.63 MIL/uL — ABNORMAL LOW (ref 3.87–5.11)
RDW: 13.1 % (ref 11.5–15.5)
Smear Review: NORMAL
WBC Morphology: INCREASED
WBC: 17.8 10*3/uL — ABNORMAL HIGH (ref 4.0–10.5)
nRBC: 0 % (ref 0.0–0.2)

## 2023-03-19 LAB — COMPREHENSIVE METABOLIC PANEL
ALT: 22 U/L (ref 0–44)
AST: 34 U/L (ref 15–41)
Albumin: 2.7 g/dL — ABNORMAL LOW (ref 3.5–5.0)
Alkaline Phosphatase: 72 U/L (ref 38–126)
Anion gap: 12 (ref 5–15)
BUN: 15 mg/dL (ref 8–23)
CO2: 22 mmol/L (ref 22–32)
Calcium: 8.5 mg/dL — ABNORMAL LOW (ref 8.9–10.3)
Chloride: 101 mmol/L (ref 98–111)
Creatinine, Ser: 0.65 mg/dL (ref 0.44–1.00)
GFR, Estimated: >60 mL/min (ref >60)
Glucose, Bld: 182 mg/dL — ABNORMAL HIGH (ref 70–99)
Potassium: 3 mmol/L — ABNORMAL LOW (ref 3.5–5.1)
Sodium: 135 mmol/L (ref 135–145)
Total Bilirubin: 0.9 mg/dL (ref 0.0–1.2)
Total Protein: 6.6 g/dL (ref 6.5–8.1)

## 2023-03-19 LAB — MAGNESIUM: Magnesium: 2.1 mg/dL (ref 1.7–2.4)

## 2023-03-19 LAB — BLOOD GAS, VENOUS
Acid-Base Excess: 1.7 mmol/L (ref 0.0–2.0)
Bicarbonate: 26.6 mmol/L (ref 20.0–28.0)
O2 Saturation: 70.7 %
Patient temperature: 36.4
pCO2, Ven: 41 mmHg — ABNORMAL LOW (ref 44–60)
pH, Ven: 7.42 (ref 7.25–7.43)
pO2, Ven: 39 mmHg (ref 32–45)

## 2023-03-19 LAB — PROCALCITONIN: Procalcitonin: 0.42 ng/mL

## 2023-03-19 LAB — MRSA NEXT GEN BY PCR, NASAL: MRSA by PCR Next Gen: NOT DETECTED

## 2023-03-19 LAB — OSMOLALITY: Osmolality: 300 mosm/kg — ABNORMAL HIGH (ref 275–295)

## 2023-03-19 LAB — PHOSPHORUS: Phosphorus: 2.3 mg/dL — ABNORMAL LOW (ref 2.5–4.6)

## 2023-03-19 MED ORDER — REVEFENACIN 175 MCG/3ML IN SOLN
175.0000 ug | Freq: Every day | RESPIRATORY_TRACT | Status: DC
  Administered 2023-03-19 – 2023-03-23 (×5): 175 ug via RESPIRATORY_TRACT
  Filled 2023-03-19 (×5): qty 3

## 2023-03-19 MED ORDER — SODIUM CHLORIDE 0.9 % IV SOLN
100.0000 mg | Freq: Two times a day (BID) | INTRAVENOUS | Status: DC
  Administered 2023-03-19 – 2023-03-22 (×7): 100 mg via INTRAVENOUS
  Filled 2023-03-19 (×9): qty 100

## 2023-03-19 MED ORDER — GUAIFENESIN-DM 100-10 MG/5ML PO SYRP
5.0000 mL | ORAL_SOLUTION | ORAL | Status: DC | PRN
  Administered 2023-03-19 – 2023-03-21 (×4): 5 mL via ORAL
  Filled 2023-03-19 (×4): qty 5

## 2023-03-19 MED ORDER — ONDANSETRON HCL 4 MG/2ML IJ SOLN
4.0000 mg | Freq: Four times a day (QID) | INTRAMUSCULAR | Status: DC | PRN
  Administered 2023-03-20: 4 mg via INTRAVENOUS
  Filled 2023-03-19 (×3): qty 2

## 2023-03-19 MED ORDER — ATORVASTATIN CALCIUM 40 MG PO TABS
40.0000 mg | ORAL_TABLET | Freq: Every day | ORAL | Status: DC
  Administered 2023-03-19 – 2023-03-23 (×5): 40 mg via ORAL
  Filled 2023-03-19 (×5): qty 1

## 2023-03-19 MED ORDER — POTASSIUM CHLORIDE CRYS ER 20 MEQ PO TBCR
40.0000 meq | EXTENDED_RELEASE_TABLET | Freq: Once | ORAL | Status: AC
  Administered 2023-03-19: 40 meq via ORAL
  Filled 2023-03-19: qty 2

## 2023-03-19 MED ORDER — ACETAMINOPHEN 325 MG PO TABS: 650.0000 mg | ORAL_TABLET | Freq: Four times a day (QID) | ORAL | Status: DC | PRN

## 2023-03-19 MED ORDER — POTASSIUM CHLORIDE 20 MEQ PO PACK
40.0000 meq | PACK | Freq: Once | ORAL | Status: AC
  Administered 2023-03-19: 40 meq via ORAL
  Filled 2023-03-19: qty 2

## 2023-03-19 MED ORDER — BENZONATATE 100 MG PO CAPS: 200.0000 mg | ORAL_CAPSULE | Freq: Three times a day (TID) | ORAL | Status: DC | PRN

## 2023-03-19 MED ORDER — CHLORHEXIDINE GLUCONATE CLOTH 2 % EX PADS
6.0000 | MEDICATED_PAD | Freq: Every day | CUTANEOUS | Status: DC
  Administered 2023-03-20: 6 via TOPICAL

## 2023-03-19 MED ORDER — ACETAMINOPHEN 650 MG RE SUPP
650.0000 mg | Freq: Four times a day (QID) | RECTAL | Status: DC | PRN
Start: 2023-03-19 — End: 2023-03-23

## 2023-03-19 MED ORDER — SODIUM CHLORIDE 0.9 % IV SOLN
INTRAVENOUS | Status: DC
  Administered 2023-03-20: 100 mL/h via INTRAVENOUS

## 2023-03-19 MED ORDER — MELATONIN 3 MG PO TABS
3.0000 mg | ORAL_TABLET | Freq: Every evening | ORAL | Status: DC | PRN
  Administered 2023-03-20: 3 mg via ORAL
  Filled 2023-03-19: qty 1

## 2023-03-19 MED ORDER — ARFORMOTEROL TARTRATE 15 MCG/2ML IN NEBU
15.0000 ug | INHALATION_SOLUTION | Freq: Two times a day (BID) | RESPIRATORY_TRACT | Status: DC
  Administered 2023-03-19 – 2023-03-23 (×9): 15 ug via RESPIRATORY_TRACT
  Filled 2023-03-19 (×9): qty 2

## 2023-03-19 MED ORDER — ASPIRIN 81 MG PO TBEC
81.0000 mg | DELAYED_RELEASE_TABLET | Freq: Every day | ORAL | Status: DC
  Administered 2023-03-19 – 2023-03-23 (×5): 81 mg via ORAL
  Filled 2023-03-19 (×5): qty 1

## 2023-03-19 MED ORDER — LEVALBUTEROL HCL 1.25 MG/0.5ML IN NEBU
1.2500 mg | INHALATION_SOLUTION | Freq: Four times a day (QID) | RESPIRATORY_TRACT | Status: DC
  Administered 2023-03-19 (×2): 1.25 mg via RESPIRATORY_TRACT
  Filled 2023-03-19 (×2): qty 0.5

## 2023-03-19 MED ORDER — LEVALBUTEROL HCL 1.25 MG/0.5ML IN NEBU
1.2500 mg | INHALATION_SOLUTION | RESPIRATORY_TRACT | Status: DC | PRN
  Administered 2023-03-20: 1.25 mg via RESPIRATORY_TRACT
  Filled 2023-03-19: qty 0.5

## 2023-03-19 MED ORDER — SODIUM CHLORIDE 0.9 % IV SOLN
1.0000 g | INTRAVENOUS | Status: DC
  Administered 2023-03-19 – 2023-03-21 (×3): 1 g via INTRAVENOUS
  Filled 2023-03-19 (×3): qty 10

## 2023-03-19 MED ORDER — IPRATROPIUM BROMIDE 0.02 % IN SOLN
0.5000 mg | Freq: Four times a day (QID) | RESPIRATORY_TRACT | Status: DC
  Administered 2023-03-19 (×2): 0.5 mg via RESPIRATORY_TRACT
  Filled 2023-03-19 (×2): qty 2.5

## 2023-03-19 MED ORDER — METHYLPREDNISOLONE SODIUM SUCC 40 MG IJ SOLR
40.0000 mg | Freq: Every day | INTRAMUSCULAR | Status: DC
  Administered 2023-03-19 – 2023-03-22 (×4): 40 mg via INTRAVENOUS
  Filled 2023-03-19 (×4): qty 1

## 2023-03-19 NOTE — Progress Notes (Signed)
 Received patient from [facility]. Admissions paged and Dr. Arlean Hopping will be the admitting physician.

## 2023-03-19 NOTE — TOC Initial Note (Signed)
 Transition of Care [facility] - Initial/Assessment Note    Patient Details  Name: Margaret Thompson MRN: [id number] Date of Birth: 1933/10/10  Transition of Care [facility]) CM/SW Contact:    Margaret Prows, RN Phone Number: 03/19/2023, 9:54 AM  Clinical Narrative:                 Spoke w/ pt in room; pt says she lives at home, and she plans to return at d/c; she identified POC Margaret Thompson (dtr) [phone] ; family will provide transportation; she denies SDOH risks; pt says she does not have DME, HH services, or home oxygen; TOC will follow.  Expected Discharge Plan: Home/Self Care Barriers to Discharge: Continued Medical Work up   Patient Goals and CMS Choice Patient states their goals for this hospitalization and ongoing recovery are:: home CMS Medicare.gov Compare Post Acute Care list provided to:: Patient        Expected Discharge Plan and Services   Discharge Planning Services: CM Consult   Living arrangements for the past 2 months: Single Family Home                                      Prior Living Arrangements/Services Living arrangements for the past 2 months: Single Family Home Lives with:: Self Patient language and need for interpreter reviewed:: Yes Do you feel safe going back to the place where you live?: Yes      Need for Family Participation in Patient Care: Yes (Comment) Care giver support system in place?: Yes (comment) Current home services:  (n/a) Criminal Activity/Legal Involvement Pertinent to Current Situation/Hospitalization: No - Comment as needed  Activities of Daily Living   ADL Screening (condition at time of admission) Independently performs ADLs?: Yes (appropriate for developmental age) Is the patient deaf or have difficulty hearing?: No Does the patient have difficulty seeing, even when wearing glasses/contacts?: No Does the patient have difficulty concentrating, remembering, or making decisions?: No  Permission  Sought/Granted Permission sought to share information with : Case Manager Permission granted to share information with : Yes, Verbal Permission Granted  Share Information with NAME: Case Manager     Permission granted to share info w Relationship: Margaret Thompson (dtr) [phone]     Emotional Assessment Appearance:: Appears stated age Attitude/Demeanor/Rapport: Gracious Affect (typically observed): Accepting Orientation: : Oriented to Self, Oriented to Place, Oriented to  Time, Oriented to Situation Alcohol / Substance Use: Not Applicable Psych Involvement: No (comment)  Admission diagnosis:  Pneumonia [J18.9] Pneumonia due to infectious organism, unspecified laterality, unspecified part of lung [J18.9] Patient Active Problem List   Diagnosis Date Noted   Severe sepsis (HCC) 03/19/2023   SOB (shortness of breath) 03/19/2023   Acute hypoxic respiratory failure (HCC) 03/19/2023   Hypokalemia 03/19/2023   Acute hyponatremia 03/19/2023   Essential hypertension 03/19/2023   HLD (hyperlipidemia) 03/19/2023   CAP (community acquired pneumonia) 03/18/2023   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   Oral thrush 12/21/2019   Postprandial vomiting 09/21/2019   Abnormal LFTs 09/21/2019   Constipation 11/30/2018   Preventative health care 05/26/2018   Abdominal pain, epigastric 11/24/2017   Odynophagia 06/04/2017   GERD (gastroesophageal reflux disease) 06/04/2017   Malignant neoplasm of left female breast (HCC)    Acute MI, 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   History of left breast cancer (2003) 11/23/2012   Bursitis, shoulder 05/25/2012   PCP:  Margaret Nevins, MD Pharmacy:  [facility] - [place], [place] - [address] [place] [address] Phone: [phone] Fax: [phone]     Social Drivers of Health (SDOH) Social History: SDOH Screenings   Food Insecurity: No Food Insecurity (03/19/2023)  Housing: Low Risk  (03/19/2023)  Transportation Needs:  No Transportation Needs (03/19/2023)  Utilities: Not At Risk (03/19/2023)  Alcohol Screen: Low Risk  (04/15/2022)  Depression (PHQ2-9): Low Risk  (04/15/2022)  Financial Resource Strain: Low Risk  (04/15/2022)  Physical Activity: Sufficiently Active (04/15/2022)  Social Connections: Moderately Integrated (03/19/2023)  Stress: No Stress Concern Present (04/15/2022)  Tobacco Use: High Risk (03/19/2023)   SDOH Interventions: Food Insecurity Interventions: Intervention Not Indicated, Inpatient TOC Housing Interventions: Intervention Not Indicated, Inpatient TOC Transportation Interventions: Intervention Not Indicated, Inpatient TOC Utilities Interventions: Intervention Not Indicated, Inpatient TOC   Readmission Risk Interventions     No data to display

## 2023-03-19 NOTE — H&P (Addendum)
 History and Physical      Margaret Thompson FAO:[id number] DOB: 04-30-1933 DOA: 03/18/2023; DOS: 03/19/2023  PCP: [name], MD  Patient coming from: home   I have personally briefly reviewed patient's old medical records in [facility]  Chief Complaint: sob  HPI: Margaret Thompson is a 89 y.o. female with medical history significant for left breast cancer in 2019 status postmastectomy and radiation, central pretension, hyperlipidemia, who is admitted to [facility] on 03/18/2023 by way of transfer from [facility] with severe sepsis due to community-acquired pneumonia after presenting from home to the latter facility complaining of shortness of breath.   Patient reports 3 to 4 days of shortness of breath associated with new onset cough as well as subjective fever in the absence of chills, fully rigors, or generalized myalgias.  Her shortness of breath is not associate with any orthopnea, PND, or worsening peripheral edema.  Denies any recent chest pain, palpitations, diaphoresis.  Denies any known baseline supplemental oxygen requirements.   [facility] ED Course:  Vital signs in the ED were notable for the following: Temperature max 101.2; heart rates in the low 100s 120s; systolic pressures in the 90s to 120s; respiratory rate 18-29; initial oxygen saturation 86% on room air, subsequently increasing into the range of 97 to 100% on 2 L nasal cannula.  Labs were notable for the following: CMP notable for the following: Sodium 131 compared to 139 on 02/16/2023, potassium 3.3, bicarbonate 26, creatinine 0.84 compared to 0.93 on 02/16/2023, glucose 118, AST 42, otherwise liver enzymes within normal limits.  High-sensitivity troponin I initially 13, with repeat value noted to be 14.  Lactic acid 1.3, the repeat value trending down to 1.2.  CBC notable for white blood cell count 20,000 with 81% neutrophils.  Blood cultures x 2 were collected prior to initiation of IV antibiotics.  COVID, influenza,  RSV PCR all negative.  MRSA PCR was negative.  Per my interpretation, EKG in ED demonstrated the following: Sinus tachycardia with PACs, heart rate 117, nonspecific T wave inversion in V3, no evidence of ST changes, Cleen evidence of ST elevation.  Imaging in the ED, per corresponding formal radiology read, was notable for the following: 2 view chest x-ray showed interval development of airspace opacity in the lingula, concerning for pneumonia superimposed on postradiation fibrosis, demonstrate no evidence of edema, effusion, or pneumothorax.  While in the ED, the following were administered: Acetaminophen 1 g p.o. x 1 dose, doxycycline 100 mg p.o. x 1, Rocephin 1 g IV x 1, duo nebulizer treatments x 2, Solu-Medrol 1 2 5  mg IV x 1, normal saline x 500 cc bolus.  Subsequently, the patient was admitted for further evaluation and management of severe sepsis due to community-acquired pneumonia complicated by acute hypoxic respiratory distress, presenting labs notable for acute hyponatremia as well as hypokalemia.    Review of Systems: As per HPI otherwise 10 point review of systems negative.   Past Medical History:  Diagnosis Date   Acute MI (HCC) 2003   Adenocarcinoma of left breast (HCC) 11/23/2012   Stage II (T1 C. N1 M0) adenocarcinoma of the left breast with surgery on 03/08/2001. She had a 1.5 cm cancer with 1/6 positive sentinel nodes. Reexcision margins were clear. She did participate in NSABP B.-30 randomized to Adriamycin and Taxotere for 4 cycles followed by radiation therapy from 07/13/2001 to 08/27/2001. She then started Arimidex on 07/07/2001 took that until the end of June 2008. T   Anemia  Bilateral cataracts    Breast cancer (HCC)    GERD (gastroesophageal reflux disease)    History of gout    History of radiation therapy    Left lung- 04/18/21-05/01/21- Dr. Antony Blackbird   Hypercholesteremia    Hypertension    Malignant neoplasm of lower-inner quadrant of left female breast  [facility])    Osteoporosis     Past Surgical History:  Procedure Laterality Date   ABDOMINAL HYSTERECTOMY     BREAST SURGERY     CATARACT EXTRACTION W/PHACO Left 09/04/2014   Procedure: CATARACT EXTRACTION PHACO AND INTRAOCULAR LENS PLACEMENT (IOC);  Surgeon: Gemma Payor, MD;  Location: [facility] ORS;  Service: Ophthalmology;  Laterality: Left;  CDE:9.84   CATARACT EXTRACTION W/PHACO Right 11/09/2014   Procedure: CATARACT EXTRACTION PHACO AND INTRAOCULAR LENS PLACEMENT (IOC);  Surgeon: Gemma Payor, MD;  Location: [facility] OR[facility];  Service: Ophthalmology;  Laterality: Right;  CDE:8.40   CORONARY ANGIOPLASTY     RCA stent   ESOPHAGOGASTRODUODENOSCOPY (EGD) WITH PROPOFOL N/A 08/04/2017   Fields: esophagitis, gastritis, no h.pylori. medium sized hiatal hernia, moderate schatzki ring s/p dilation   LEFT HEART CATHETERIZATION WITH CORONARY ANGIOGRAM N/A 12/21/2012   Procedure: LEFT HEART CATHETERIZATION WITH CORONARY ANGIOGRAM;  Surgeon: Robynn Pane, MD;  Location: [facility] CATH LAB;  Service: Cardiovascular;  Laterality: N/A;   MASTECTOMY MODIFIED RADICAL Left 01/16/2017   Procedure: SIMPLE MASTECTOMY;  Surgeon: Franky Macho, MD;  Location: [facility] OR[facility];  Service: General;  Laterality: Left;   MASTECTOMY, PARTIAL Left    PERCUTANEOUS STENT INTERVENTION     RCA   PORT-A-CATH REMOVAL     PORT-A-CATH REMOVAL Right 09/13/2018   Procedure: REMOVAL PORT-A-CATH;  Surgeon: Franky Macho, MD;  Location: [facility];  Service: General;  Laterality: Right;   PORTACATH PLACEMENT     PORTACATH PLACEMENT Right 03/11/2017   Procedure: INSERTION PORT-A-CATH;  Surgeon: Franky Macho, MD;  Location: [facility];  Service: General;  Laterality: Right;   SAVORY DILATION N/A 08/04/2017   Procedure: SAVORY DILATION;  Surgeon: West Bali, MD;  Location: [facility] ENDO SUITE;  Service: Endoscopy;  Laterality: N/A;    Social History:  reports that she has been smoking cigarettes. She has a 25 pack-year smoking history. She has been exposed to tobacco smoke. She  has never used smokeless tobacco. She reports that she does not drink alcohol and does not use drugs.   Allergies  Allergen Reactions   Azithromycin Anaphylaxis and Hives   Ivp Dye [Iodinated Contrast Media] Hives    CT    Cipro [Ciprofloxacin Hcl]    Gatifloxacin    Levaquin [Levofloxacin]     Family History  Problem Relation Age of Onset   Stroke Mother    Colon cancer Neg Hx    Gastric cancer Neg Hx    Esophageal cancer Neg Hx     Family history reviewed and not pertinent    Prior to Admission medications   Medication Sig Start Date End Date Taking? Authorizing Provider  amoxicillin-clavulanate (AUGMENTIN) 875-125 MG tablet Take 1 tablet by mouth 2 (two) times daily. 03/18/23  Yes Cheron Schaumann K, PA-C  doxycycline (VIBRAMYCIN) 100 MG capsule Take 1 capsule (100 mg total) by mouth 2 (two) times daily. 03/18/23  Yes Cheron Schaumann K, PA-C  albuterol (VENTOLIN HFA) 108 (90 Base) MCG/ACT inhaler Inhale 2 puffs into the lungs every 4 (four) hours. Patient not taking: Reported on 02/23/2023 06/12/22   [provider]  aspirin EC 81 MG tablet Take 81 mg by mouth  daily.    [provider]  atorvastatin (LIPITOR) 40 MG tablet Take 40 mg by mouth daily. 11/26/16   [provider]  diltiazem (CARDIZEM CD) 180 MG 24 hr capsule Take 180 mg by mouth daily. 03/09/20   [provider]  gabapentin (NEURONTIN) 100 MG capsule Take 3 capsules (300 mg total) by mouth at bedtime. Patient not taking: Reported on 02/23/2023 02/17/22   Doreatha Massed, MD  triamcinolone ointment (KENALOG) 0.1 % Apply 1 Application topically 2 (two) times daily. Apply sparingly twice daily as needed for itching.  Do not use for more than 14 days in a row. Patient not taking: Reported on 02/23/2023 06/15/22   Valentino Nose, NP     Objective    Physical Exam: Vitals:   03/18/23 2230 03/18/23 2300 03/18/23 2330 03/19/23 0130  BP: (!) 115/57 111/74 106/73   Pulse: (!) 108 (!) 105  100   Resp: 18 (!) 23 18   Temp: 98.7 F (37.1 C)   97.9 F (36.6 C)  TempSrc: Oral   Oral  SpO2: 99% 100% 97%     General: appears to be stated age; alert, oriented; mildly increased work of breathing noted Skin: warm, dry, no rash Head:  AT/[place] Mouth:  Oral mucosa membranes appear moist, normal dentition Neck: supple; trachea midline Heart: Tachycardic, but regular; did not appreciate any M/R/G Lungs: CTAB, did not appreciate any wheezes, rales, or rhonchi Abdomen: + BS; soft, ND, NT Vascular: 2+ pedal pulses b/l; 2+ radial pulses b/l Extremities: no peripheral edema, no muscle wasting Neuro: strength and sensation intact in upper and lower extremities b/l   Labs on Admission: I have personally reviewed following labs and imaging studies  CBC: Recent Labs  Lab 03/18/23 1638  WBC 20.0*  NEUTROABS 16.1*  HGB 12.0  HCT 35.9*  MCV 91.3  PLT 331   Basic Metabolic Panel: Recent Labs  Lab 03/18/23 1638  NA 131*  K 3.3*  CL 96*  CO2 26  GLUCOSE 118*  BUN 21  CREATININE 0.84  CALCIUM 9.8   GFR: CrCl cannot be calculated (Unknown ideal weight.). Liver Function Tests: Recent Labs  Lab 03/18/23 1638  AST 42*  ALT 21  ALKPHOS 83  BILITOT 0.7  PROT 7.4  ALBUMIN 3.7   No results for input(s): "LIPASE", "AMYLASE" in the last 168 hours. No results for input(s): "AMMONIA" in the last 168 hours. Coagulation Profile: No results for input(s): "INR", "PROTIME" in the last 168 hours. Cardiac Enzymes: No results for input(s): "CKTOTAL", "CKMB", "CKMBINDEX", "TROPONINI" in the last 168 hours. BNP (last 3 results) No results for input(s): "PROBNP" in the last 8760 hours. HbA1C: No results for input(s): "HGBA1C" in the last 72 hours. CBG: No results for input(s): "GLUCAP" in the last 168 hours. Lipid Profile: No results for input(s): "CHOL", "HDL", "LDLCALC", "TRIG", "CHOLHDL", "LDLDIRECT" in the last 72 hours. Thyroid Function Tests: No results for input(s): "TSH",  "T4TOTAL", "FREET4", "T3FREE", "THYROIDAB" in the last 72 hours. Anemia Panel: No results for input(s): "VITAMINB12", "FOLATE", "FERRITIN", "TIBC", "IRON", "RETICCTPCT" in the last 72 hours. Urine analysis:    Component Value Date/Time   COLORURINE YELLOW 02/20/2015 1330   APPEARANCEUR CLEAR 02/20/2015 1330   LABSPEC 1.015 02/20/2015 1330   PHURINE 8.0 02/20/2015 1330   GLUCOSEU NEGATIVE 02/20/2015 1330   HGBUR NEGATIVE 02/20/2015 1330   BILIRUBINUR SMALL (A) 02/20/2015 1330   KETONESUR TRACE (A) 02/20/2015 1330   PROTEINUR 30 (A) 02/20/2015 1330   NITRITE NEGATIVE 02/20/2015  1330   LEUKOCYTESUR MODERATE (A) 02/20/2015 1330    Radiological Exams on Admission: DG Chest 2 View Result Date: 03/18/2023 CLINICAL DATA:  Cough. Left chest pain with cough. Flu symptoms for several days. EXAM: CHEST - 2 VIEW COMPARISON:  03/17/2023.  Chest CT dated 02/16/2023. FINDINGS: Dense patchy opacity in the lingula with mild progression. The remainder of the lungs are clear. Mild prominence of the interstitial markings, right greater than left, with improvement. Extensive atheromatous aortic calcifications. No acute bony abnormality. Old, healed left humeral neck fracture and mild bilateral shoulder degenerative changes. Normal sized heart. Mitral valve annulus calcifications. IMPRESSION: 1. Extensive postradiation fibrosis and scarring is again demonstrated in the lingula. This appears somewhat more dense than previously seen, suggesting the possibility of superimposed pneumonia. 2. Mild chronic interstitial lung disease. Electronically Signed   By: Beckie Salts M.D.   On: 03/18/2023 18:59   DG Chest Portable 1 View Result Date: 03/17/2023 CLINICAL DATA:  Cough and malaise for 2 days. Flu and COVID test negative at urgent care. Left rib pain with coughing. EXAM: PORTABLE CHEST 1 VIEW COMPARISON:  Chest CT 02/16/2023 FINDINGS: There is mild cardiomegaly. The aorta and mitral ring are heavily calcified. Stable  mediastinum with mild aortic tortuosity. Slight central vascular prominence. Basal predominant pulmonary fibrosis is again noted. The pulmonary interstitium appears more prominent today which could indicate new interstitial pneumonitis or mild interstitial edema. No focal pneumonia is evident. Asymmetric focal scarring again noted in the lateral left base. There is osteopenia and thoracic spondylosis. No new osseous findings. Left axillary surgical clips and left mastectomy. IMPRESSION: 1. Basal predominant pulmonary fibrosis. 2. The pulmonary interstitium appears more prominent today which could indicate new interstitial pneumonitis or mild interstitial edema. 3. Aortic and mitral ring atherosclerosis. 4. Osteopenia and thoracic spondylosis. 5. Left mastectomy and left axillary surgical clips. Electronically Signed   By: Almira Bar M.D.   On: 03/17/2023 22:31      Assessment/Plan    Principal Problem:   CAP (community acquired pneumonia) Active Problems:   Severe sepsis (HCC)   SOB (shortness of breath)   Acute hypoxic respiratory failure (HCC)   Hypokalemia   Acute hyponatremia   Essential hypertension   HLD (hyperlipidemia)     #) Severe sepsis due to community-acquired pneumonia: Diagnosed on basis of 3 to 4 days of progressive shortness of breath associated new onset cough, objective fever, with presenting chest x-ray suggestive of interval development of airspace opacity in the lingula, concerning for pneumonia superimposed on her postradiation fibrosis.  SIRS criteria met via elevated will with cell count of 20,000 with 81% neutrophils, objective fever, tachycardia, tachypnea. Lactic acid level: 1.3 followed by 1.2. Of note, given the associated presence of suspected end organ damage in the form of concominant presenting acute hypoxic respiratory distress, criteria are met for pt's sepsis to be considered severe in nature. However, in the absence of lactic acid level that is  greater than or equal to 4.0, and in the absence of any associated hypotension refractory to IVF's, there are no indications for administration of a 30 mL/kg IVF bolus at this time.   Additional ED work-up/management notable for: Collection of blood cultures x 2 followed by initiation of Rocephin and doxycycline, which will be continued for empiric coverage of community-acquired pneumonia  No e/o additional infectious process at this time, including COVID, influenza, RSV PCR were all negative.  Additionally, MRSA PCR was negative, conveying a high negative predictive value for underlying MRSA pneumonia.  Plan: CBC w/ diff and CMP in AM.  Follow for results of blood cx's x 2. Abx: Continue Rocephin and doxycycline, as above.  Add on procalcitonin level.  Check strep pneumonia urine antigen.  Tessalon Perles on a as needed basis.  Incentive spirometry, flutter valve.  Prn acetaminophen for fever.  Scheduled Xopenex/ipratropium nebulizers, prn Xopenex nebulizers.  Check urinalysis.                #) Acute hypoxic respiratory distress: In the absence of any known baseline supplemental requirements, presents with acute respiratory symptoms and initial oxygen saturations in the mid 80s on room air, [place] improving into the high 90s to 100% on 2 L nasal cannula.  Is present in the basis of community-acquired pneumonia with evidence of airspace opacity involving the lingula, representing a new radiographic finding relative to most recent prior imaging of the chest.  Of note, COVID, influenza, RSV PCR were all negative.  No clinical or radiographic evidence to suggest acute decompensated heart failure.  ACS appears less likely, in the absence of any recent chest pain, troponin are nonelevated and flat, and EKG shows no evidence of acute ischemic changes.  Clinically, acute pulmonary embolism appears less likely at this time.  Chest x-ray shows no evidence of pneumothorax.  Per my initial chart  review, no documentation of a history of COPD.  Chest x-ray shows some evidence of interstitial lung disease, raising possibility of potential interstitial lung disease exacerbation as result of pneumonia.  She received a dose of solumedrol at [facility] earlier today.  Will refrain from additional steroids for now, but closely monitor ensuing clinical trend to aid in additional decision making regarding need for potential resumption of IV steroids.  Plan: Further evaluation management presenting severe sepsis due to community-acquired pneumonia, including IV antibiotics, as above.  Add on procalcitonin level.  Check blood gas in the morning.  Repeat CMP, CBC in the morning.  Flutter valve, incentive parameter.  Check serum magnesium level as well as phosphorus level.                #) Hypokalemia: presenting potassium level noted to be 3.3, with likely contribution from intracellular shift as a consequence of recurrent use of beta-2 agonists.    Plan: monitor on tele. KCl 40 meq p.o. x 1 dose now. Add-on serum mag level. CMP, mag level in the AM.                     #) Acute hypo-osmolar  hyponatremia: Presenting serum sodium level 131, relative demonstration prior value 139 on 02/16/2023. Suspect an element of hypovolumeia, with suspected contribution from dehydration in the setting of clinical evidence of such. However, differential also includes the possibility of a contribution from SIADH, particularly given suspected presenting community-acquired pneumonia.  the patient has received a 500 cc NS bolus at [facility].  will refrain from provision of additional IVF's pending the result of random urine sodium and urine osmolality studies to provide additional insight into potential contributions from dehydration vs SIADH, as subsequent management would differ depending upon these results.   Plan: monitor strict I's and O's and daily weights.  check UA, random urine sodium,  urine osmolality.  Check serum osmolality to confirm suspected hypoosmolar etiology.  Repeat BMP in the morning.                    #) Essential Hypertension: documented h/o such, with outpatient antihypertensive regimen including diltiazem daily.  SBP's in the ED today: 90s to 120s mmHg.   Plan: Close monitoring of subsequent BP via routine VS. in the setting of presenting severe sepsis, will hold home diltiazem for now.  Monitor on telemetry.                  #) Hyperlipidemia: documented h/o such. On high intensity atorvastatin as outpatient.   Plan: continue home statin.      DVT prophylaxis: SCD's   Code Status: Full code Family Communication: none Disposition Plan: Per Rounding Team Consults called: none;  Admission status: Inpatient    I SPENT GREATER THAN 75  MINUTES IN CLINICAL CARE TIME/MEDICAL DECISION-MAKING IN COMPLETING THIS ADMISSION.     Chaney Born Cristiano Capri DO [facility] From 7PM - 7AM   03/19/2023, 1:38 AM

## 2023-03-19 NOTE — Plan of Care (Signed)
 Patient admitted from [facility], she's from home, being admitted for pneumonia, see chart for assessment, plan of care and goals reviewed with patient, handbook/guide at bedside, MRSA swab sent to lab, IV's flushed and saline locked. Mepilex applied to sacrum and to right lower leg behind the knee. Bed in low locked position with call bell in reach and side rails up.  Problem: Education: Goal: Knowledge of General Education information will improve Description: Including pain rating scale, medication(s)/side effects and non-pharmacologic comfort measures Outcome: Progressing   Problem: Health Behavior/Discharge Planning: Goal: Ability to manage health-related needs will improve Outcome: Progressing   Problem: Clinical Measurements: Goal: Ability to maintain clinical measurements within normal limits will improve Outcome: Progressing Goal: Will remain free from infection Outcome: Progressing Goal: Diagnostic test results will improve Outcome: Progressing Goal: Respiratory complications will improve Outcome: Progressing Goal: Cardiovascular complication will be avoided Outcome: Progressing   Problem: Activity: Goal: Risk for activity intolerance will decrease Outcome: Progressing   Problem: Nutrition: Goal: Adequate nutrition will be maintained Outcome: Progressing   Problem: Coping: Goal: Level of anxiety will decrease Outcome: Progressing   Problem: Elimination: Goal: Will not experience complications related to bowel motility Outcome: Progressing Goal: Will not experience complications related to urinary retention Outcome: Progressing   Problem: Pain Managment: Goal: General experience of comfort will improve and/or be controlled Outcome: Progressing   Problem: Safety: Goal: Ability to remain free from injury will improve Outcome: Progressing   Problem: Skin Integrity: Goal: Risk for impaired skin integrity will decrease Outcome: Progressing   Problem:  Activity: Goal: Ability to tolerate increased activity will improve Outcome: Progressing   Problem: Clinical Measurements: Goal: Ability to maintain a body temperature in the normal range will improve Outcome: Progressing   Problem: Respiratory: Goal: Ability to maintain adequate ventilation will improve Outcome: Progressing Goal: Ability to maintain a clear airway will improve Outcome: Progressing

## 2023-03-19 NOTE — Hospital Course (Signed)
 89 y.o. F with hx smoking, HTN, HLD and BrCA s/p mastectomy, chemorad in 2019 now in remission who presented with URI symptoms.  Diagnosed with pneumonia and admitted.

## 2023-03-19 NOTE — Progress Notes (Signed)
    Patient: KHARI LETT QMV:[id number] DOB: 1933/03/16      Brief hospital course: 89 y.o. F with hx smoking, HTN, HLD and BrCA s/p mastectomy, chemorad in 2019 now in remission who presented with URI symptoms.  Diagnosed with pneumonia and admitted.    This is a no charge note, for further details, please see the H&P by my partner, Dr. Arlean Hopping from earlier today.   Principal Problem:   CAP (community acquired pneumonia) Active Problems:   History of left breast cancer (2003)   Hypokalemia   Acute hyponatremia   Essential hypertension   HLD (hyperlipidemia)    Sepsis ruled out.  Respiratory failure ruled out.  May just be viral.  Off O2, just tired and still tachycardic, sinus.  - Continue IV fulids and K  - Continue empiric antibiotics     Physical Exam: BP (!) 91/45 (BP Location: Right Arm)   Pulse 99   Temp 97.8 F (36.6 C) (Oral)   Resp (!) 24   Ht 4' 11.06" (1.5 m)   Wt 50.6 kg   SpO2 (!) 89%   BMI 22.49 kg/m   Patient seen and examined.     Family Communication: Called to Daughters, no answer        Author: Alberteen Sam, MD 03/19/2023 12:55 PM

## 2023-03-20 DIAGNOSIS — J439 Emphysema, unspecified: Secondary | ICD-10-CM | POA: Insufficient documentation

## 2023-03-20 DIAGNOSIS — J189 Pneumonia, unspecified organism: Secondary | ICD-10-CM | POA: Diagnosis not present

## 2023-03-20 LAB — BASIC METABOLIC PANEL
Anion gap: 10 (ref 5–15)
BUN: 13 mg/dL (ref 8–23)
CO2: 22 mmol/L (ref 22–32)
Calcium: 8.4 mg/dL — ABNORMAL LOW (ref 8.9–10.3)
Chloride: 109 mmol/L (ref 98–111)
Creatinine, Ser: 0.62 mg/dL (ref 0.44–1.00)
GFR, Estimated: >60 mL/min (ref >60)
Glucose, Bld: 148 mg/dL — ABNORMAL HIGH (ref 70–99)
Potassium: 3.5 mmol/L (ref 3.5–5.1)
Sodium: 141 mmol/L (ref 135–145)

## 2023-03-20 LAB — CBC
HCT: 34 % — ABNORMAL LOW (ref 36.0–46.0)
Hemoglobin: 10.5 g/dL — ABNORMAL LOW (ref 12.0–15.0)
MCH: 30.1 pg (ref 26.0–34.0)
MCHC: 30.9 g/dL (ref 30.0–36.0)
MCV: 97.4 fL (ref 80.0–100.0)
Platelets: 342 10*3/uL (ref 150–400)
RBC: 3.49 MIL/uL — ABNORMAL LOW (ref 3.87–5.11)
RDW: 13.4 % (ref 11.5–15.5)
WBC: 21.1 10*3/uL — ABNORMAL HIGH (ref 4.0–10.5)
nRBC: 0 % (ref 0.0–0.2)

## 2023-03-20 MED ORDER — DILTIAZEM HCL ER COATED BEADS 180 MG PO CP24
180.0000 mg | ORAL_CAPSULE | Freq: Every day | ORAL | Status: DC
Start: 2023-03-20 — End: 2023-03-23
  Administered 2023-03-20 – 2023-03-23 (×4): 180 mg via ORAL
  Filled 2023-03-20 (×4): qty 1

## 2023-03-20 MED ORDER — DILTIAZEM HCL 30 MG PO TABS
30.0000 mg | ORAL_TABLET | Freq: Three times a day (TID) | ORAL | Status: DC
  Administered 2023-03-20: 30 mg via ORAL
  Filled 2023-03-20: qty 1

## 2023-03-20 NOTE — Plan of Care (Signed)
  Problem: Activity: Goal: Risk for activity intolerance will decrease Outcome: Progressing   Problem: Nutrition: Goal: Adequate nutrition will be maintained Outcome: Progressing   Problem: Coping: Goal: Level of anxiety will decrease Outcome: Progressing   Problem: Safety: Goal: Ability to remain free from injury will improve Outcome: Progressing   Problem: Skin Integrity: Goal: Risk for impaired skin integrity will decrease Outcome: Progressing   Problem: Activity: Goal: Ability to tolerate increased activity will improve Outcome: Progressing

## 2023-03-20 NOTE — Significant Event (Signed)
 Rapid Response Event Note   Reason for Call :  Rapid Heart Rate  Initial Focused Assessment:  Patient appears calm in bed. All vital signs stable except for their rapid heart rate. Patient on tele and heart rate is variable, ranging between 90's-170's. Heart rate appears irregular but P waves are present. 12 lead obtained and placed in chart. IV team at bedside obtaining new access.   MD Danford at bedside and at the moment no adjustments in POC needed.   Doctor Danford, bedside nursing, and myself discussed that unless patient begins to decline there is no need to treat the heart rate alone. That the goal is to fix the under laying issue thus fixing the patients heart rate.   If patient were to become diaphoretic, hypotensive, confused, or clinically worse please reach out to MD and rapid response.   Teresita Madura, RN

## 2023-03-20 NOTE — Progress Notes (Signed)
   03/20/23 1513  PT Visit Information  Last PT Received On 03/20/23  Reason Eval/Treat Not Completed Medical issues which prohibited therapy (pt with HR in 130-140s at rest. Not appropriate for participation in therapy at this time and RN request hold. PT will follow up as schedule allows.)

## 2023-03-20 NOTE — Plan of Care (Signed)

## 2023-03-20 NOTE — Assessment & Plan Note (Signed)
 CT shows extensive emphysema.  DLCO on PFTs 2 years ago 39%. - Steroids - Bronchodilators

## 2023-03-20 NOTE — Assessment & Plan Note (Signed)
-   Continue rocephin and doxycycline - Continue flutter, IS - Continue Mucinex

## 2023-03-20 NOTE — Assessment & Plan Note (Signed)
 Resume home diltiazem.

## 2023-03-20 NOTE — Assessment & Plan Note (Signed)
Resolved with supplementation and starting spironolactone. 

## 2023-03-20 NOTE — Progress Notes (Signed)
  Progress Note   Patient: Margaret Thompson [id number] DOB: 05/09/1933 DOA: 03/18/2023     1 DOS: the patient was seen and examined on 03/20/2023 at 8:54AM      Brief hospital course: 89 y.o. F with hx smoking, HTN, HLD and BrCA s/p mastectomy, chemorad in 2019 now in remission who presented with URI symptoms.  Diagnosed with pneumonia and admitted.     Assessment and Plan: * CAP (community acquired pneumonia) - Continue rocephin and doxycycline - Continue flutter, IS - Continue Mucinex  Emphysema with both acute and chronic bronchitis (HCC) CT shows extensive emphysema.  DLCO on PFTs 2 years ago 39%. - Steroids - Bronchodilators  HLD (hyperlipidemia) - Continue lipitor and aspirin  Essential hypertension - Resume home diltiazem  Acute hyponatremia Resolved  Hypokalemia - Supplement K  History of left breast cancer (2003)            Subjective: Still coughing, no fever, no malaise.     Physical Exam: BP 113/66 (BP Location: Right Arm)   Pulse (!) 105   Temp 98.2 F (36.8 C) (Oral)   Resp 18   Ht 4' 11.06" (1.5 m)   Wt 51.8 kg   SpO2 93%   BMI 23.04 kg/m   Adult female, lying in bed, interactive and appropriate Tachycardic, irregular, no murmurs, no peripheral edema Wheezing bilaterally Abdomen soft no tenderness palpation or guarding, no ascites or distention Attention normal, affect normal, judgment and insight appear impaired but at baseline, strength weak    Data Reviewed: Patient metabolic panel normal CBC shows white count of 21, hemoglobin 10.5  Family Communication: Daughter at bedside    Disposition: Status is: Inpatient         Author: Alberteen Sam, MD 03/20/2023 7:02 PM  For on call review [email or web address].

## 2023-03-20 NOTE — Assessment & Plan Note (Signed)
Continue lipitor and aspirin.  ?

## 2023-03-20 NOTE — Assessment & Plan Note (Signed)
 Resolved

## 2023-03-21 DIAGNOSIS — J189 Pneumonia, unspecified organism: Secondary | ICD-10-CM | POA: Diagnosis not present

## 2023-03-21 LAB — BASIC METABOLIC PANEL
Anion gap: 9 (ref 5–15)
BUN: 18 mg/dL (ref 8–23)
CO2: 23 mmol/L (ref 22–32)
Calcium: 9 mg/dL (ref 8.9–10.3)
Chloride: 108 mmol/L (ref 98–111)
Creatinine, Ser: 0.75 mg/dL (ref 0.44–1.00)
GFR, Estimated: >60 mL/min (ref >60)
Glucose, Bld: 128 mg/dL — ABNORMAL HIGH (ref 70–99)
Potassium: 3.6 mmol/L (ref 3.5–5.1)
Sodium: 140 mmol/L (ref 135–145)

## 2023-03-21 LAB — CBC
HCT: 35.8 % — ABNORMAL LOW (ref 36.0–46.0)
Hemoglobin: 11.2 g/dL — ABNORMAL LOW (ref 12.0–15.0)
MCH: 29.9 pg (ref 26.0–34.0)
MCHC: 31.3 g/dL (ref 30.0–36.0)
MCV: 95.7 fL (ref 80.0–100.0)
Platelets: 411 10*3/uL — ABNORMAL HIGH (ref 150–400)
RBC: 3.74 MIL/uL — ABNORMAL LOW (ref 3.87–5.11)
RDW: 13.6 % (ref 11.5–15.5)
WBC: 18.3 10*3/uL — ABNORMAL HIGH (ref 4.0–10.5)
nRBC: 0 % (ref 0.0–0.2)

## 2023-03-21 NOTE — Evaluation (Signed)
 Physical Therapy Evaluation Patient Details Name: Margaret Thompson MRN: [id number] DOB: [birth year] Today's Date: 03/21/2023  History of Present Illness  Pt is an 89 y.o. female with above HPI admitted with severe sepsis due to community-acquired pneumonia after presenting from home complaining of shortness of breath. PMH significant for left breast cancer in 2019 status postmastectomy and radiation, central pretension, hyperlipidemia, osteoporosis, and MI (2003).   Clinical Impression  Pt is an 89 y.o. female with above HPI resulting in the deficits listed below (see PT Problem List). Pt is typically independent with mobility and ADLs at baseline, Daughter lives next door. Pt performed sit to stand transfers with supervision for safety and ambulated total of ~123ft with supervision regressing to CGA with increased LE muscle fatigue and SOB. Pt will benefit from continued skilled PT to maximize functional mobility and  increase independence during acute stay. Recommend continuation of services with HHPT upon d/c.                 Equipment Recommendations None recommended by PT  Recommendations for Other Services       Functional Status Assessment Patient has had a recent decline in their functional status and demonstrates the ability to make significant improvements in function in a reasonable and predictable amount of time.     Precautions / Restrictions Precautions Precautions: Fall Precaution/Restrictions Comments: monitor O2 Restrictions Weight Bearing Restrictions Per Provider Order: No      Mobility  Bed Mobility               General bed mobility comments: Pt in recliner pre/post session    Transfers Overall transfer level: Needs assistance Equipment used: None Transfers: Sit to/from Stand Sit to Stand: Supervision           General transfer comment: STS x3. supervision for safety. Able to stand from toilet with use of grab bar.     Ambulation/Gait Ambulation/Gait assistance: Contact guard assist Gait Distance (Feet): 120 Feet Assistive device: None Gait Pattern/deviations: Step-through pattern, Decreased stride length Gait velocity: decreased     General Gait Details: initially supervision , regressed to CGA with increased fatigue and feeling SOB. X1 standing rest break with ambulation due to SOB. Cues for deep breathing techniques. Intermittent furniture reaching  Stairs            Wheelchair Mobility     Tilt Bed    Modified Rankin (Stroke Patients Only)       Balance Overall balance assessment: Mild deficits observed, not formally tested                                           Pertinent Vitals/Pain Pain Assessment Pain Assessment:  (discomfort at IV site)    Home Living Family/patient expects to be discharged to:: Private residence Living Arrangements: Alone Available Help at Discharge: Family Type of Home: House Home Access: Stairs to enter Entrance Stairs-Rails: None Entrance Stairs-Number of Steps: 1   Home Layout: One level Home Equipment: Agricultural consultant (2 wheels);Cane - single point;Rollator (4 wheels);BSC/3in1 Additional Comments: daughter and her family live next door (daughter works at a school)    Prior Function Prior Level of Function : Independent/Modified Independent;Driving             Mobility Comments: no AD at baseline.  Denies falls. ADLs Comments: sponge baths at baseline.     Extremity/Trunk Assessment  Upper Extremity Assessment Upper Extremity Assessment: Overall WFL for tasks assessed    Lower Extremity Assessment Lower Extremity Assessment: Generalized weakness    Cervical / Trunk Assessment Cervical / Trunk Assessment: Normal  Communication   Communication Communication: No apparent difficulties    Cognition Arousal: Alert Behavior During Therapy: WFL for tasks assessed/performed   PT - Cognitive impairments: No  apparent impairments                         Following commands: Intact       Cueing       General Comments General comments (skin integrity, edema, etc.): Pt on RA upon Entry with O2 91%. O2 low of 78% observed with going to bathroom on RA. HR 110's. recovery to 93% with rest. RN notified that pt IV leaking small amount of blood noted on floor (RN in to assess during session and disconnected from IV pole). With ambulation O2 low of 86% and HR MAX 129bpm, with recovery to 94%, 105bpm at end of session while on RA.    Exercises     Assessment/Plan    PT Assessment Patient needs continued PT services  PT Problem List Decreased strength;Decreased activity tolerance;Decreased balance;Decreased mobility;Cardiopulmonary status limiting activity       PT Treatment Interventions DME instruction;Gait training;Stair training;Functional mobility training;Therapeutic activities;Therapeutic exercise;Balance training;Patient/family education    PT Goals (Current goals can be found in the Care Plan section)  Acute Rehab PT Goals Patient Stated Goal: Be able to do more without feeling tired/SOB PT Goal Formulation: With patient Time For Goal Achievement: 04/04/23 Potential to Achieve Goals: Good    Frequency Min 2X/week     Co-evaluation               AM-PAC PT "6 Clicks" Mobility  Outcome Measure Help needed turning from your back to your side while in a flat bed without using bedrails?: None Help needed moving from lying on your back to sitting on the side of a flat bed without using bedrails?: None Help needed moving to and from a bed to a chair (including a wheelchair)?: A Little Help needed standing up from a chair using your arms (e.g., wheelchair or bedside chair)?: A Little Help needed to walk in hospital room?: A Little Help needed climbing 3-5 steps with a railing? : A Lot 6 Click Score: 19    End of Session Equipment Utilized During Treatment: Gait  belt Activity Tolerance: Treatment limited secondary to medical complications (Comment) (O2 desats) Patient left: in chair;with call bell/phone within reach Nurse Communication: Mobility status;Other (comment) (RN/MD updated on O2 sats) PT Visit Diagnosis: Unsteadiness on feet (R26.81);Muscle weakness (generalized) (M62.81)    Time: 6440-3474 PT Time Calculation (min) (ACUTE ONLY): 36 min   Charges:   PT Evaluation $PT Eval Low Complexity: 1 Low PT Treatments $Therapeutic Activity: 23-37 mins PT General Charges $$ ACUTE PT VISIT: 1 Visit         Lyman Speller PT, DPT  Acute Rehabilitation Services  Office [phone]  03/21/2023, 2:18 PM

## 2023-03-21 NOTE — Plan of Care (Signed)

## 2023-03-21 NOTE — Progress Notes (Signed)
  Progress Note   Patient: Margaret Thompson [id number] DOB: 1933/06/23 DOA: 03/18/2023     2 DOS: the patient was seen and examined on 03/21/2023 at 10:25AM      Brief hospital course: 89 y.o. F with hx smoking, HTN, HLD and BrCA s/p mastectomy, chemorad in 2019 now in remission who presented with URI symptoms.  Diagnosed with pneumonia and admitted.     Assessment and Plan: * CAP (community acquired pneumonia) -Continue Rocephin and doxycycline, day 4 of 5 - Continue flutter and incentive spirometer - Continue Mucinex  Emphysema with both acute and chronic bronchitis (HCC) DLCO on PFT used 2 years ago 39%.  CT showed extensive emphysema - Continue Solu-Medrol - Continue bronchodilators  Multifocal atrial tachycardia Versus sinus tachycardia with frequent bursts of SVT - Continue diltiazem  HLD (hyperlipidemia) -Continue Lipitor and aspirin  Essential hypertension -Continue diltiazem  Acute hyponatremia Resolved  Hypokalemia Resolved  History of left breast cancer (2003)            Subjective: Patient felt tired and uncomfortable this morning from being woken up overnight multiple times.  However this afternoon she feels better.  She still coughing and wheezing.     Physical Exam: BP 105/62 (BP Location: Right Arm)   Pulse 96   Temp 98 F (36.7 C) (Oral)   Resp 20   Ht 4' 11.06" (1.5 m)   Wt 52.6 kg   SpO2 93%   BMI 23.37 kg/m   Elderly adult female, lying in bed, no acute distress Tachycardic, irregular, no murmurs, no peripheral edema Respiratory rate seems normal, she is coarse wheezing bilaterally, no rales Abdomen soft no tenderness palpation Attention normal, affect normal, judgment and insight appear impaired but at baseline Face symmetric, speech fluent   Data Reviewed: Basic metabolic panel unremarkable CBC shows white count down to 18, hemoglobin stable at 11.2  Family Communication: Daughters at the bedside, son at the  bedside    Disposition: Status is: Inpatient The patient is still hypoxic with ambulation        Author: Alberteen Sam, MD 03/21/2023 5:27 PM  For on call review [email or web address].

## 2023-03-22 DIAGNOSIS — J189 Pneumonia, unspecified organism: Secondary | ICD-10-CM | POA: Diagnosis not present

## 2023-03-22 MED ORDER — PREDNISONE 50 MG PO TABS
50.0000 mg | ORAL_TABLET | Freq: Every day | ORAL | Status: DC
  Administered 2023-03-23: 50 mg via ORAL
  Filled 2023-03-22: qty 1

## 2023-03-22 MED ORDER — DOXYCYCLINE HYCLATE 100 MG PO TABS
100.0000 mg | ORAL_TABLET | Freq: Two times a day (BID) | ORAL | Status: DC
  Administered 2023-03-22 – 2023-03-23 (×2): 100 mg via ORAL
  Filled 2023-03-22 (×3): qty 1

## 2023-03-22 MED ORDER — AMOXICILLIN-POT CLAVULANATE 875-125 MG PO TABS
1.0000 | ORAL_TABLET | Freq: Two times a day (BID) | ORAL | Status: DC
  Administered 2023-03-22 – 2023-03-23 (×2): 1 via ORAL
  Filled 2023-03-22 (×2): qty 1

## 2023-03-22 NOTE — Progress Notes (Signed)
  Progress Note   Patient: Margaret Thompson [id number] DOB: [birth year] DOA: 03/18/2023     3 DOS: the patient was seen and examined on 03/22/2023 at 9:36AM      Brief hospital course: 89 y.o. F with hx smoking, HTN, HLD and BrCA s/p mastectomy, chemorad in 2019 now in remission who presented with URI symptoms.  Diagnosed with pneumonia and admitted.     Assessment and Plan: * CAP (community acquired pneumonia) Improving - Continue antibiotics, change to Augmentin PO doxycycline  Emphysema with both acute and chronic bronchitis (HCC) - Continue steroids, change to prednisone - Continue bronchodilators  Multifocal atrial tachycardia - Continue diltiazem, titrate up dose             Subjective: Patient still desaturating with ambulation to 84%.  No fever, still with productive cough.     Physical Exam: BP (!) 102/48   Pulse 90   Temp 97.9 F (36.6 C)   Resp 20   Ht 4' 11.06" (1.5 m)   Wt 52.6 kg   SpO2 97%   BMI 23.36 kg/m   Elderly adult female, lying in bed, no acute distress Tachycardic, irregular, no murmurs, no peripheral edema Respiratory rate seems normal, she is coarse wheezing bilaterally, no rales Abdomen soft no tenderness palpation Attention normal, affect normal, judgment and insight appear impaired but at baseline Face symmetric, speech fluent   Data Reviewed: No new labs  Family Communication: Daughter at the bedside    Disposition: Status is: Inpatient The patient is still hypoxic with ambulation        Author: Alberteen Sam, MD 03/22/2023 4:30 PM  For on call review [email or web address].

## 2023-03-22 NOTE — Progress Notes (Signed)
 Mobility Specialist - Progress Note   03/22/23 1231  Oxygen Therapy  SpO2 (!) 84 %  O2 Device Room Air  Mobility  Activity Ambulated with assistance in hallway  Level of Assistance Standby assist, set-up cues, supervision of patient - no hands on  Assistive Device Front wheel walker  Distance Ambulated (ft) 275 ft  Activity Response Tolerated well  Mobility Referral Yes  Mobility visit 1 Mobility  Mobility Specialist Start Time (ACUTE ONLY) 1218  Mobility Specialist Stop Time (ACUTE ONLY) 1230  Mobility Specialist Time Calculation (min) (ACUTE ONLY) 12 min   Nurse requested Mobility Specialist to perform oxygen saturation test with pt which includes removing pt from oxygen both at rest and while ambulating.  Below are the results from that testing.     Patient Saturations on Room Air at Rest = spO2 84%  Patient Saturations on 2 Liters of oxygen while Ambulating = sp02 92%  At end of testing pt left in room on 2  Liters of oxygen.  Reported results to nurse.  Pt received in bed and agreeable to mobility.  SOB with exertion. Pt to EOB after session with all needs met.    Pre-mobility: 104 HR, 84% SpO2 (RA) During mobility:  92% SpO2 (2L [place]) Post-mobility: 106 HR, 96% SPO2 (2L [place])  Chief Technology Officer

## 2023-03-22 NOTE — TOC Progression Note (Signed)
 Transition of Care [facility]) - Progression Note    Patient Details  Name: Margaret Thompson MRN: [id number] Date of Birth: 1933-08-14  [facility] CM/SW Contact  Epifanio Lesches, RN Phone Number: 03/22/2023, 10:04 AM  Clinical Narrative:    NCM @ bedside with pt to discuss d/c plans. Shared PT's evaluation / recommendation for HHPT. Pt declined States has 2 daughters that will assist with all needs once home. No DME needs noted. Pt without transportation issues or RX med concerns. TOC team will continue to monitor and assist with needs...  Expected Discharge Plan: Home w Home Health Services Barriers to Discharge: Continued Medical Work up  Expected Discharge Plan and Services   Discharge Planning Services: CM Consult   Living arrangements for the past 2 months: Single Family Home                                       Social Determinants of Health (SDOH) Interventions SDOH Screenings   Food Insecurity: No Food Insecurity (03/19/2023)  Housing: Low Risk  (03/19/2023)  Transportation Needs: No Transportation Needs (03/19/2023)  Utilities: Not At Risk (03/19/2023)  Alcohol Screen: Low Risk  (04/15/2022)  Depression (PHQ2-9): Low Risk  (04/15/2022)  Financial Resource Strain: Low Risk  (04/15/2022)  Physical Activity: Sufficiently Active (04/15/2022)  Social Connections: Moderately Integrated (03/19/2023)  Stress: No Stress Concern Present (04/15/2022)  Tobacco Use: High Risk (03/19/2023)    Readmission Risk Interventions     No data to display

## 2023-03-22 NOTE — Plan of Care (Signed)

## 2023-03-22 NOTE — Plan of Care (Signed)
  Problem: Clinical Measurements: Goal: Ability to maintain clinical measurements within normal limits will improve Outcome: Progressing Goal: Will remain free from infection Outcome: Progressing Goal: Diagnostic test results will improve Outcome: Progressing   Problem: Nutrition: Goal: Adequate nutrition will be maintained Outcome: Progressing   Problem: Coping: Goal: Level of anxiety will decrease Outcome: Progressing   Problem: Pain Managment: Goal: General experience of comfort will improve and/or be controlled Outcome: Progressing   Problem: Safety: Goal: Ability to remain free from injury will improve Outcome: Progressing   Problem: Clinical Measurements: Goal: Ability to maintain a body temperature in the normal range will improve Outcome: Progressing   Problem: Respiratory: Goal: Ability to maintain a clear airway will improve Outcome: Progressing

## 2023-03-23 ENCOUNTER — Other Ambulatory Visit (HOSPITAL_COMMUNITY): Payer: Self-pay

## 2023-03-23 DIAGNOSIS — J189 Pneumonia, unspecified organism: Secondary | ICD-10-CM | POA: Diagnosis not present

## 2023-03-23 DIAGNOSIS — I10 Essential (primary) hypertension: Secondary | ICD-10-CM | POA: Diagnosis not present

## 2023-03-23 DIAGNOSIS — J439 Emphysema, unspecified: Secondary | ICD-10-CM

## 2023-03-23 DIAGNOSIS — Z853 Personal history of malignant neoplasm of breast: Secondary | ICD-10-CM

## 2023-03-23 DIAGNOSIS — E871 Hypo-osmolality and hyponatremia: Secondary | ICD-10-CM | POA: Diagnosis not present

## 2023-03-23 DIAGNOSIS — J209 Acute bronchitis, unspecified: Secondary | ICD-10-CM

## 2023-03-23 DIAGNOSIS — J440 Chronic obstructive pulmonary disease with acute lower respiratory infection: Secondary | ICD-10-CM

## 2023-03-23 LAB — CBC
HCT: 33.9 % — ABNORMAL LOW (ref 36.0–46.0)
Hemoglobin: 10.6 g/dL — ABNORMAL LOW (ref 12.0–15.0)
MCH: 30.4 pg (ref 26.0–34.0)
MCHC: 31.3 g/dL (ref 30.0–36.0)
MCV: 97.1 fL (ref 80.0–100.0)
Platelets: 372 10*3/uL (ref 150–400)
RBC: 3.49 MIL/uL — ABNORMAL LOW (ref 3.87–5.11)
RDW: 13.6 % (ref 11.5–15.5)
WBC: 18.8 10*3/uL — ABNORMAL HIGH (ref 4.0–10.5)
nRBC: 0.2 % (ref 0.0–0.2)

## 2023-03-23 LAB — BASIC METABOLIC PANEL
Anion gap: 7 (ref 5–15)
BUN: 17 mg/dL (ref 8–23)
CO2: 26 mmol/L (ref 22–32)
Calcium: 8.3 mg/dL — ABNORMAL LOW (ref 8.9–10.3)
Chloride: 106 mmol/L (ref 98–111)
Creatinine, Ser: 0.7 mg/dL (ref 0.44–1.00)
GFR, Estimated: >60 mL/min (ref >60)
Glucose, Bld: 146 mg/dL — ABNORMAL HIGH (ref 70–99)
Potassium: 3.2 mmol/L — ABNORMAL LOW (ref 3.5–5.1)
Sodium: 139 mmol/L (ref 135–145)

## 2023-03-23 MED ORDER — POTASSIUM CHLORIDE CRYS ER 20 MEQ PO TBCR
40.0000 meq | EXTENDED_RELEASE_TABLET | Freq: Two times a day (BID) | ORAL | Status: DC
  Administered 2023-03-23: 40 meq via ORAL
  Filled 2023-03-23: qty 2

## 2023-03-23 MED ORDER — GUAIFENESIN-DM 100-10 MG/5ML PO SYRP: 5.0000 mL | ORAL_SOLUTION | ORAL | Status: DC | PRN

## 2023-03-23 MED ORDER — AMOXICILLIN-POT CLAVULANATE 875-125 MG PO TABS
1.0000 | ORAL_TABLET | Freq: Two times a day (BID) | ORAL | 0 refills | Status: DC
  Filled 2023-03-23 (×2): qty 7, 4d supply, fill #0

## 2023-03-23 MED ORDER — DOXYCYCLINE HYCLATE 100 MG PO TABS
100.0000 mg | ORAL_TABLET | Freq: Two times a day (BID) | ORAL | 0 refills | Status: DC
  Filled 2023-03-23: qty 7, 4d supply, fill #0

## 2023-03-23 MED ORDER — BENZONATATE 200 MG PO CAPS
200.0000 mg | ORAL_CAPSULE | Freq: Three times a day (TID) | ORAL | 0 refills | Status: DC | PRN
  Filled 2023-03-23: qty 20, 7d supply, fill #0

## 2023-03-23 MED ORDER — PREDNISONE 10 MG PO TABS
ORAL_TABLET | ORAL | 0 refills | Status: DC
  Filled 2023-03-23: qty 10, 4d supply, fill #0

## 2023-03-23 MED ORDER — POTASSIUM CHLORIDE 20 MEQ PO PACK
60.0000 meq | PACK | Freq: Once | ORAL | Status: AC
  Administered 2023-03-23: 60 meq via ORAL
  Filled 2023-03-23: qty 3

## 2023-03-23 MED ORDER — ALBUTEROL SULFATE HFA 108 (90 BASE) MCG/ACT IN AERS
2.0000 | INHALATION_SPRAY | RESPIRATORY_TRACT | 0 refills | Status: AC | PRN
  Filled 2023-03-23: qty 6.7, 25d supply, fill #0

## 2023-03-23 MED ORDER — POTASSIUM CHLORIDE 20 MEQ PO PACK
20.0000 meq | PACK | Freq: Every day | ORAL | 0 refills | Status: AC
  Filled 2023-03-23: qty 5, 5d supply, fill #0

## 2023-03-23 NOTE — Plan of Care (Signed)

## 2023-03-23 NOTE — Discharge Summary (Signed)
 Physician Discharge Summary   Patient: Margaret Thompson MRN: [id number] DOB: [birth year]  Admit date:     03/18/2023  Discharge date: 03/23/23  Discharge Physician: Alberteen Sam   PCP: [name], MD     Recommendations at discharge:  Follow up with PCP Dr. Sherwood Gambler and Dr. Phillips Odor in 1 week for pneumonia Dr. Sherwood Gambler or Phillips Odor: Please check BMP in 1 week (discharge potassium 3.2) Please check ambulatory O2 sat in the office in 3-4 weeks and d/c home O2 if appropriate If not, please refer for Pulmonology follow up and repeat PFTs Please obtain echocardiogram, see below     Discharge Diagnoses: Principal Problem:   CAP (community acquired pneumonia) Active Problems:   Emphysema with both acute and chronic bronchitis (HCC)   History of left breast cancer (2003)   Hypokalemia   Hyponatremia   Essential hypertension   HLD (hyperlipidemia)   Murmur     Hospital Course: 89 y.o. F with hx smoking, HTN, HLD and BrCA s/p mastectomy, chemorad in 2019 now in remission who presented with URI symptoms.  Diagnosed with pneumonia and admitted.       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  Patient presented with leukocytosis, cough, wheezing, mild hypoxia and worsening opacities on chronic lung fibrosis.  RSV, flu negative.  May have been a viral pneumonia with emphysema flare.  She was treated empirically with antibiotics and steroids and improved to clinical baseline.    Despite resolution of symptoms, she remained hypoxic with ambulation.  Given her extensive lung disease (previous DLCO 39% 2 years ago and extensive emphysema and CPFE on CT), I suspect this is her baseline.  Recommended finishing antibiotics and prednisone taper, follow up with PCP for weaning home oxygen.   New heart murmur Clinically asymptomatic, but new heart murmur appreciated on discharge. - Recommend outpatient echo               The [facility] Controlled Substances Registry was reviewed for this patient prior to discharge.  Consultants: None   Disposition: Home with home health and new oxygen Diet recommendation:  Discharge Diet Orders (From admission, onward)     Start     Ordered   03/23/23 0000  Diet - low sodium heart healthy        03/23/23 1153             DISCHARGE MEDICATION: Allergies as of 03/23/2023       Reactions   Azithromycin Anaphylaxis, Hives   Ivp Dye [iodinated Contrast Media] Hives, Other (See Comments)   "CT dye"   Cipro [ciprofloxacin Hcl] Hives, Itching, Rash   Gatifloxacin Hives, Itching, Rash, Other (See Comments)   Gatifloxacin (brand names Gatiflo, Tequin, and Zymar) is an antibiotic of the fourth-generation fluoroquinolone family.   Levaquin [levofloxacin] Hives, Itching, Rash        Medication List     STOP taking these medications    gabapentin 100 MG capsule Commonly known as: NEURONTIN   triamcinolone ointment 0.1 % Commonly known as: KENALOG       TAKE these medications    albuterol 108 (90 Base) MCG/ACT inhaler Commonly known as: VENTOLIN HFA Inhale 2 puffs into the lungs every 4 (four) hours as needed for wheezing or shortness of breath.   amoxicillin-clavulanate 875-125 MG tablet Commonly known as: AUGMENTIN Take 1 tablet by mouth every 12 (twelve) hours.   aspirin 81 MG chewable tablet Chew 81  mg by mouth in the morning.   atorvastatin 40 MG tablet Commonly known as: LIPITOR Take 40 mg by mouth daily.   benzonatate 200 MG capsule Commonly known as: TESSALON Take 1 capsule (200 mg total) by mouth 3 (three) times daily as needed for cough.   diltiazem 180 MG 24 hr capsule Commonly known as: CARDIZEM CD Take 180 mg by mouth daily.   doxycycline 100 MG tablet Commonly known as: VIBRA-TABS Take 1 tablet (100 mg total) by mouth every 12 (twelve) hours.   guaiFENesin-dextromethorphan 100-10 MG/5ML  syrup Commonly known as: ROBITUSSIN DM Take 5 mLs by mouth every 4 (four) hours as needed (mild cough).   potassium chloride 20 MEQ packet Commonly known as: KLOR-CON Take 20 mEq by mouth at bedtime.   predniSONE 10 MG tablet Commonly known as: DELTASONE Take prednisone 40 mg (4 tabs) daily for 1 day then take prednisone 30 mg (3 tabs) daily for 1 day then take prednisone 20 mg (2 tabs) daily for 1 day then take prednisone 10 mg (1 tab) then stop               Durable Medical Equipment  (From admission, onward)           Start     Ordered   03/23/23 1150  DME Oxygen  Once       Question Answer Comment  Length of Need Lifetime   Mode or (Route) Nasal cannula   Liters per Minute 2   Frequency Continuous (stationary and portable oxygen unit needed)   Oxygen delivery system Gas      03/23/23 1153            Follow-up Information     [name], MD. Schedule an appointment as soon as possible for a visit in 1 week(s).   Specialty: Internal Medicine Contact information: [address] [place] [address] [phone]                 Discharge Instructions     Diet - low sodium heart healthy   Complete by: As directed    Discharge instructions   Complete by: As directed    **IMPORTANT DISCHARGE INSTRUCTIONS**   From Dr. Maryfrances Bunnell: You were admitted for trouble breathing and cough.  Here, we found that this was likely from a pneumonia on top of your chronic lung problem  For the pneumonia, you were treated with antibiotics, for the flare of your chronic lung disease you were treated with steroids (prednisone) and breathing treatments to open the airways (albuterol)  You also need new oxygen  Finish the antibiotics with Augmentin and doxycycline Take Augmentin 875-125 mg twice daily starting tonight until Thursday night Take doxycycline 100 mg twice daily starting tonight until Thursday night  For the steroids, take the following  prednisone taper: Tuesday Take prednisone 40 mg (4 tabs) in the morning Weds: take prednisone 30 mg (3 tabs)  Thu: take prednisone 20 mg (2 tabs)  Fri: take prednisone 10 mg (1 tab) then stop  Use the albuterol twice daily at least for the next week After a week, only use as needed  Also, you have a heart murmur, discuss with Dr. Sherwood Gambler and Dr. Phillips Odor and ask them to arrange a heart ultrasound (echo)  I have mentioned this to them in the discharge summary  Use the oxygen all the time, especially when walking around and sleeping, until you see Dr. Sherwood Gambler Ask him when you see him if you can  take it off  Your potassium was low.  Take the potassium supplement in the evening for a few more days then stop   Increase activity slowly   Complete by: As directed    No wound care   Complete by: As directed    No wound care   Complete by: As directed        Discharge Exam: Filed Weights   03/21/23 0600 03/22/23 0651 03/23/23 0444  Weight: 52.6 kg 52.6 kg 52.5 kg    General: Pt is alert, awake, not in acute distress Cardiovascular: RRR, nl S1-S2, new systolic murmur noted.   No LE edema.   Respiratory: Normal respiratory rate and rhythm.  Coarse sounds bilaterally, some coarse crackles, but no wheezing.  Overall poor air movement. Abdominal: Abdomen soft and non-tender.  No distension or HSM.   Neuro/Psych: Strength symmetric in upper and lower extremities.  Judgment and insight appear normal.   Condition at discharge: fair  The results of significant diagnostics from this hospitalization (including imaging, microbiology, ancillary and laboratory) are listed below for reference.   Imaging Studies: DG Chest 2 View Result Date: 03/18/2023 CLINICAL DATA:  Cough. Left chest pain with cough. Flu symptoms for several days. EXAM: CHEST - 2 VIEW COMPARISON:  03/17/2023.  Chest CT dated 02/16/2023. FINDINGS: Dense patchy opacity in the lingula with mild progression. The remainder of the lungs  are clear. Mild prominence of the interstitial markings, right greater than left, with improvement. Extensive atheromatous aortic calcifications. No acute bony abnormality. Old, healed left humeral neck fracture and mild bilateral shoulder degenerative changes. Normal sized heart. Mitral valve annulus calcifications. IMPRESSION: 1. Extensive postradiation fibrosis and scarring is again demonstrated in the lingula. This appears somewhat more dense than previously seen, suggesting the possibility of superimposed pneumonia. 2. Mild chronic interstitial lung disease. Electronically Signed   By: Beckie Salts M.D.   On: 03/18/2023 18:59   DG Chest Portable 1 View Result Date: 03/17/2023 CLINICAL DATA:  Cough and malaise for 2 days. Flu and COVID test negative at urgent care. Left rib pain with coughing. EXAM: PORTABLE CHEST 1 VIEW COMPARISON:  Chest CT 02/16/2023 FINDINGS: There is mild cardiomegaly. The aorta and mitral ring are heavily calcified. Stable mediastinum with mild aortic tortuosity. Slight central vascular prominence. Basal predominant pulmonary fibrosis is again noted. The pulmonary interstitium appears more prominent today which could indicate new interstitial pneumonitis or mild interstitial edema. No focal pneumonia is evident. Asymmetric focal scarring again noted in the lateral left base. There is osteopenia and thoracic spondylosis. No new osseous findings. Left axillary surgical clips and left mastectomy. IMPRESSION: 1. Basal predominant pulmonary fibrosis. 2. The pulmonary interstitium appears more prominent today which could indicate new interstitial pneumonitis or mild interstitial edema. 3. Aortic and mitral ring atherosclerosis. 4. Osteopenia and thoracic spondylosis. 5. Left mastectomy and left axillary surgical clips. Electronically Signed   By: Almira Bar M.D.   On: 03/17/2023 22:31   MM 3D SCREENING MAMMOGRAM UNILATERAL RIGHT BREAST Result Date: 03/13/2023 CLINICAL DATA:  Screening.  EXAM: DIGITAL SCREENING UNILATERAL RIGHT MAMMOGRAM WITH CAD AND TOMOSYNTHESIS TECHNIQUE: Right screening digital craniocaudal and mediolateral oblique mammograms were obtained. Right screening digital breast tomosynthesis was performed. The images were evaluated with computer-aided detection. COMPARISON:  Previous exam(s). ACR Breast Density Category b: There are scattered areas of fibroglandular density. FINDINGS: The patient has had a left mastectomy. There are no findings suspicious for malignancy. IMPRESSION: No mammographic evidence of malignancy. A result letter of this screening  mammogram will be mailed directly to the patient. RECOMMENDATION: Screening mammogram in one year.  (Code:[id number]) BI-RADS CATEGORY  1: Negative. Electronically Signed   By: Frederico Hamman M.D.   On: 03/13/2023 15:01    Microbiology: Results for orders placed or performed during the hospital encounter of 03/18/23  Resp panel by RT-PCR (RSV, Flu A&B, Covid) Anterior Nasal Swab     Status: None   Collection Time: 03/18/23  4:20 PM   Specimen: Anterior Nasal Swab  Result Value Ref Range Status   SARS Coronavirus 2 by RT PCR NEGATIVE NEGATIVE Final    Comment: (NOTE) SARS-CoV-2 target nucleic acids are NOT DETECTED.  The SARS-CoV-2 RNA is generally detectable in upper respiratory specimens during the acute phase of infection. The lowest concentration of SARS-CoV-2 viral copies this assay can detect is 138 copies/mL. A negative result does not preclude SARS-Cov-2 infection and should not be used as the sole basis for treatment or other patient management decisions. A negative result may occur with  improper specimen collection/handling, submission of specimen other than nasopharyngeal swab, presence of viral mutation(s) within the areas targeted by this assay, and inadequate number of viral copies(<138 copies/mL). A negative result must be combined with clinical observations, patient history, and  epidemiological information. The expected result is Negative.  Fact Sheet for Patients:  BloggerCourse.com  Fact Sheet for Healthcare Providers:  SeriousBroker.it  This test is no t yet approved or cleared by the [place] FDA and  has been authorized for detection and/or diagnosis of SARS-CoV-2 by FDA under an Emergency Use Authorization (EUA). This EUA will remain  in effect (meaning this test can be used) for the duration of the COVID-19 declaration under Section 564(b)(1) of the Act, 21 U.S.C.section 360bbb-3(b)(1), unless the authorization is terminated  or revoked sooner.       Influenza A by PCR NEGATIVE NEGATIVE Final   Influenza B by PCR NEGATIVE NEGATIVE Final    Comment: (NOTE) The Xpert Xpress SARS-CoV-2/FLU/RSV plus assay is intended as an aid in the diagnosis of influenza from Nasopharyngeal swab specimens and should not be used as a sole basis for treatment. Nasal washings and aspirates are unacceptable for Xpert Xpress SARS-CoV-2/FLU/RSV testing.  Fact Sheet for Patients: BloggerCourse.com  Fact Sheet for Healthcare Providers: SeriousBroker.it  This test is not yet approved or cleared by the [place] FDA and has been authorized for detection and/or diagnosis of SARS-CoV-2 by FDA under an Emergency Use Authorization (EUA). This EUA will remain in effect (meaning this test can be used) for the duration of the COVID-19 declaration under Section 564(b)(1) of the Act, 21 U.S.C. section 360bbb-3(b)(1), unless the authorization is terminated or revoked.     Resp Syncytial Virus by PCR NEGATIVE NEGATIVE Final    Comment: (NOTE) Fact Sheet for Patients: BloggerCourse.com  Fact Sheet for Healthcare Providers: SeriousBroker.it  This test is not yet approved or cleared by the [place] FDA and has been  authorized for detection and/or diagnosis of SARS-CoV-2 by FDA under an Emergency Use Authorization (EUA). This EUA will remain in effect (meaning this test can be used) for the duration of the COVID-19 declaration under Section 564(b)(1) of the Act, 21 U.S.C. section 360bbb-3(b)(1), unless the authorization is terminated or revoked.  Performed at [facility], [address] [place], [place] [address]   Blood culture (routine x 2)     Status: None (Preliminary result)   Collection Time: 03/18/23  8:21 PM   Specimen: BLOOD RIGHT ARM  Result Value Ref Range Status   Specimen Description   Final    BLOOD RIGHT ARM Performed at [facility], [address] [place], [place] [address]    Special Requests   Final    BOTTLES DRAWN AEROBIC AND ANAEROBIC Blood Culture adequate volume Performed at [facility], [address] [place], [place] [address]    Culture   Final    NO GROWTH 4 DAYS Performed at [facility], [address] [place], [place] [address]    Report Status PENDING  Incomplete  Blood culture (routine x 2)     Status: None (Preliminary result)   Collection Time: 03/18/23  8:31 PM   Specimen: BLOOD LEFT ARM  Result Value Ref Range Status   Specimen Description   Final    BLOOD LEFT ARM Performed at [facility], [address], [place], [place] [address]    Special Requests   Final    BOTTLES DRAWN AEROBIC AND ANAEROBIC Blood Culture adequate volume Performed at [facility], [address], [place], [place] [address]    Culture   Final    NO GROWTH 4 DAYS Performed at [facility], [address] [place], [place] [address]    Report Status PENDING  Incomplete  MRSA Next Gen by PCR, Nasal     Status: None   Collection Time: 03/19/23  1:26 AM   Specimen: Nasal Mucosa; Nasal Swab  Result Value Ref Range Status   MRSA by PCR Next Gen NOT DETECTED NOT DETECTED Final    Comment:  (NOTE) The GeneXpert MRSA Assay (FDA approved for NASAL specimens only), is one component of a comprehensive MRSA colonization surveillance program. It is not intended to diagnose MRSA infection nor to guide or monitor treatment for MRSA infections. Test performance is not FDA approved in patients less than 52 years old. Performed at [facility], [address] [place], [place] [address]     Labs: CBC: Recent Labs  Lab 03/18/23 1638 03/19/23 0254 03/20/23 0524 03/21/23 0710 03/23/23 0505  WBC 20.0* 17.8* 21.1* 18.3* 18.8*  NEUTROABS 16.1* 16.5*  --   --   --   HGB 12.0 11.0* 10.5* 11.2* 10.6*  HCT 35.9* 34.2* 34.0* 35.8* 33.9*  MCV 91.3 94.2 97.4 95.7 97.1  PLT 331 295 342 411* 372   Basic Metabolic Panel: Recent Labs  Lab 03/18/23 1638 03/19/23 0254 03/20/23 0524 03/21/23 0710 03/23/23 0505  NA 131* 135 141 140 139  K 3.3* 3.0* 3.5 3.6 3.2*  CL 96* 101 109 108 106  CO2 26 22 22 23 26   GLUCOSE 118* 182* 148* 128* 146*  BUN 21 15 13 18 17   CREATININE 0.84 0.65 0.62 0.75 0.70  CALCIUM 9.8 8.5* 8.4* 9.0 8.3*  MG  --  2.1  --   --   --   PHOS  --  2.3*  --   --   --    Liver Function Tests: Recent Labs  Lab 03/18/23 1638 03/19/23 0254  AST 42* 34  ALT 21 22  ALKPHOS 83 72  BILITOT 0.7 0.9  PROT 7.4 6.6  ALBUMIN 3.7 2.7*   CBG: No results for input(s): "GLUCAP" in the last 168 hours.  Discharge time spent: approximately 45 minutes spent on discharge counseling, evaluation of patient on day of discharge, and coordination of discharge planning with nursing, social work, pharmacy and case management  Signed: Alberteen Sam, MD  [facility] 03/23/2023

## 2023-03-23 NOTE — Progress Notes (Addendum)
 SATURATION QUALIFICATIONS: (This note is used to comply with regulatory documentation for home oxygen)  Patient Saturations on Room Air at Rest = 84%  Patient Saturations on Room Air while Ambulating = 80%  Patient Saturations on 2 Liters of oxygen while Ambulating = 91%  Please briefly explain why patient needs home oxygen: Patient became hypoxic on room air while ambulating with mobility.

## 2023-03-23 NOTE — Progress Notes (Signed)
 Mobility Specialist - Progress Note   03/23/23 1038  Oxygen Therapy  SpO2 (!) 80 %  O2 Device Room Air  Patient Activity (if Appropriate) Ambulating  Mobility  Activity Ambulated with assistance in hallway  Level of Assistance Modified independent, requires aide device or extra time  Assistive Device Front wheel walker  Distance Ambulated (ft) 230 ft  Activity Response Tolerated well  Mobility Referral Yes  Mobility visit 1 Mobility  Mobility Specialist Start Time (ACUTE ONLY) 1026  Mobility Specialist Stop Time (ACUTE ONLY) 1037  Mobility Specialist Time Calculation (min) (ACUTE ONLY) 11 min   Pt received in bed and agreeable to mobility. Once ambulating, pt SpO2 at 80%. Place [place] on 2L. Pt returning to 93%. No complaints during session. Pt to bed after session with all needs met.    Pre-mobility: 109 HR, 80-93% SpO2 (RA/2L [place]) During mobility: 99 HR, 91% SpO2 (2L [place]) Post-mobility: 111 HR, 89% SPO2 (2L [place])  Chief Technology Officer

## 2023-03-23 NOTE — Progress Notes (Signed)
 TOC discharge meds in a secure bag delivered to pt in room - placed on window seat by this RN

## 2023-03-23 NOTE — TOC Transition Note (Signed)
 Transition of Care [facility]) - Discharge Note   Patient Details  Name: Margaret Thompson MRN: [id number] Date of Birth: 04-05-1933  [facility] CM/SW Contact:  Otelia Santee, LCSW Phone Number: 03/23/2023, 1:38 PM   Clinical Narrative:    Pt with new home O2 requirement. Pt does not have preference for DME agency. O2 has been ordered through [employer] and will be delivered to pt's room prior to discharge. No further TOC needs identified.    Final next level of care: Home/Self Care Barriers to Discharge: Barriers Resolved   Patient Goals and CMS Choice Patient states their goals for this hospitalization and ongoing recovery are:: home CMS Medicare.gov Compare Post Acute Care list provided to:: Patient Choice offered to / list presented to : Patient [place] ownership interest in [facility].provided to::  (NA)    Discharge Placement                       Discharge Plan and Services Additional resources added to the After Visit Summary for     Discharge Planning Services: CM Consult            DME Arranged: Oxygen DME Agency: [employer] Date DME Agency Contacted: 03/23/23 Time DME Agency Contacted: [phone] Representative spoke with at DME Agency: Marthann Schiller HH Arranged: Refused HH, Patient Refused HH          Social Drivers of Health (SDOH) Interventions SDOH Screenings   Food Insecurity: No Food Insecurity (03/19/2023)  Housing: Low Risk  (03/19/2023)  Transportation Needs: No Transportation Needs (03/19/2023)  Utilities: Not At Risk (03/19/2023)  Alcohol Screen: Low Risk  (04/15/2022)  Depression (PHQ2-9): Low Risk  (04/15/2022)  Financial Resource Strain: Low Risk  (04/15/2022)  Physical Activity: Sufficiently Active (04/15/2022)  Social Connections: Moderately Integrated (03/19/2023)  Stress: No Stress Concern Present (04/15/2022)  Tobacco Use: High Risk (03/19/2023)     Readmission Risk Interventions    03/23/2023    1:37 PM  Readmission Risk Prevention  Plan  Post Dischage Appt Complete  Medication Screening Complete  Transportation Screening Complete

## 2023-03-23 NOTE — Care Management Important Message (Signed)
 Important Message  Patient Details IM Letter given. Name: Margaret Thompson MRN: [id number] Date of Birth: Jul 21, 1933   Important Message Given:  Yes - Medicare IM     Caren Macadam 03/23/2023, 1:57 PM

## 2023-03-24 ENCOUNTER — Other Ambulatory Visit: Payer: Self-pay | Admitting: Family Medicine

## 2023-03-24 LAB — CULTURE, BLOOD (ROUTINE X 2)
Culture: NO GROWTH
Culture: NO GROWTH
Special Requests: ADEQUATE
Special Requests: ADEQUATE

## 2023-03-24 MED ORDER — AMOXICILLIN-POT CLAVULANATE 600-42.9 MG/5ML PO SUSR: 875.0000 mg | Freq: Two times a day (BID) | ORAL | 0 refills | Status: AC

## 2023-04-17 DIAGNOSIS — Z681 Body mass index (BMI) 19 or less, adult: Secondary | ICD-10-CM | POA: Diagnosis not present

## 2023-04-17 DIAGNOSIS — J449 Chronic obstructive pulmonary disease, unspecified: Secondary | ICD-10-CM | POA: Diagnosis not present

## 2023-04-27 ENCOUNTER — Encounter: Payer: Self-pay | Admitting: *Deleted

## 2023-04-30 DIAGNOSIS — R0789 Other chest pain: Secondary | ICD-10-CM | POA: Diagnosis not present

## 2023-04-30 DIAGNOSIS — I10 Essential (primary) hypertension: Secondary | ICD-10-CM | POA: Diagnosis not present

## 2023-04-30 DIAGNOSIS — I2510 Atherosclerotic heart disease of native coronary artery without angina pectoris: Secondary | ICD-10-CM | POA: Diagnosis not present

## 2023-04-30 DIAGNOSIS — E782 Mixed hyperlipidemia: Secondary | ICD-10-CM | POA: Diagnosis not present

## 2023-05-15 DIAGNOSIS — C4491 Basal cell carcinoma of skin, unspecified: Secondary | ICD-10-CM | POA: Diagnosis not present

## 2023-05-15 DIAGNOSIS — J449 Chronic obstructive pulmonary disease, unspecified: Secondary | ICD-10-CM | POA: Diagnosis not present

## 2023-05-15 DIAGNOSIS — Z681 Body mass index (BMI) 19 or less, adult: Secondary | ICD-10-CM | POA: Diagnosis not present

## 2023-05-20 DIAGNOSIS — Z681 Body mass index (BMI) 19 or less, adult: Secondary | ICD-10-CM | POA: Diagnosis not present

## 2023-05-20 DIAGNOSIS — H6500 Acute serous otitis media, unspecified ear: Secondary | ICD-10-CM | POA: Diagnosis not present

## 2023-06-07 NOTE — Progress Notes (Unsigned)
 Margaret Thompson, female    DOB: 1933/05/28    MRN: [id number]   Brief patient profile:  20  yowf  active smoker  referred to pulmonary clinic in [place]  06/11/2023 by Dr Glady Laming  for copd and  lung nodule LUL(lingula)  s/ p Radiation completed April 2023 in [place]  PET positive left lung nodule: - SBRT to the left lung lesion from 04/28/2021 through 05/01/2021, 5 fractions, 60 Delon Ferris Jan 2024 and pain ever since L axilla with fx ribs on ct    History of Present Illness  06/11/2023  Pulmonary/ 1st office eval/ Waymond Hailey / [place] [facility]  Chief Complaint  Patient presents with   Establish Care   COPD  Dyspnea:  food lion "all day ok",  steps are ok but  trouble doing house cleaning/ Cough: productive x white thick stuff  Sleep: on couch propped up on arm x 4-5 years  SABA use: has not using  02: has 02 / not needing  L ax Cp is positional, not pleuritic, with good shoulder mobility     No obvious day to day or daytime pattern/variability or assoc  mucus plugs or hemoptysis or  chest tightness, subjective wheeze or overt sinus or hb symptoms.    Also denies any obvious fluctuation of symptoms with weather or environmental changes or other aggravating or alleviating factors except as outlined above   No unusual exposure hx or h/o childhood pna/ asthma or knowledge of premature birth.  Current Allergies, Complete Past Medical History, Past Surgical History, Family History, and Social History were reviewed in [employer] record.  ROS  The following are not active complaints unless bolded Hoarseness, sore throat, dysphagia, dental problems, itching, sneezing,  nasal congestion or discharge of excess mucus or purulent secretions, ear ache,   fever, chills, sweats, unintended wt loss or wt gain, classically pleuritic or exertional cp,  orthopnea pnd or arm/hand swelling  or leg swelling, presyncope, palpitations, abdominal pain, anorexia, nausea, vomiting, diarrhea   or change in bowel habits or change in bladder habits, change in stools or change in urine, dysuria, hematuria,  rash, arthralgias, visual complaints, headache, numbness, weakness or ataxia or problems with walking or coordination,  change in mood or  memory.            Outpatient Medications Prior to Visit  Medication Sig Dispense Refill   albuterol  (VENTOLIN  HFA) 108 (90 Base) MCG/ACT inhaler Inhale 2 puffs into the lungs every 4 (four) hours as needed for wheezing or shortness of breath. 6.7 g 0   aspirin  81 MG chewable tablet Chew 81 mg by mouth in the morning.     atorvastatin  (LIPITOR) 40 MG tablet Take 40 mg by mouth daily.     benzonatate  (TESSALON ) 200 MG capsule Take 1 capsule (200 mg total) by mouth 3 (three) times daily as needed for cough. 20 capsule 0   diltiazem  (CARDIZEM  CD) 180 MG 24 hr capsule Take 180 mg by mouth daily.     doxycycline  (VIBRA -TABS) 100 MG tablet Take 1 tablet (100 mg total) by mouth every 12 (twelve) hours. 7 tablet 0   guaiFENesin -dextromethorphan  (ROBITUSSIN DM) 100-10 MG/5ML syrup Take 5 mLs by mouth every 4 (four) hours as needed (mild cough).     potassium chloride  (KLOR-CON ) 20 MEQ packet Take 20 mEq by mouth at bedtime. 5 packet 0   predniSONE  (DELTASONE ) 10 MG tablet Take prednisone  40 mg (4 tabs) daily for 1 day then take  prednisone  30 mg (3 tabs) daily for 1 day then take prednisone  20 mg (2 tabs) daily for 1 day then take prednisone  10 mg (1 tab) then stop 10 tablet 0   No facility-administered medications prior to visit.    Past Medical History:  Diagnosis Date   Acute MI (HCC) 2003   Adenocarcinoma of left breast (HCC) 11/23/2012   Stage II (T1 C. N1 M0) adenocarcinoma of the left breast with surgery on 03/08/2001. She had a 1.5 cm cancer with 1/6 positive sentinel nodes. Reexcision margins were clear. She did participate in NSABP B.-30 randomized to Adriamycin and Taxotere for 4 cycles followed by radiation therapy from 07/13/2001 to  08/27/2001. She then started Arimidex on 07/07/2001 took that until the end of June 2008. T   Anemia    Bilateral cataracts    Breast cancer (HCC)    GERD (gastroesophageal reflux disease)    History of gout    History of radiation therapy    Left lung- 04/18/21-05/01/21- Dr. Retta Caster   Hypercholesteremia    Hypertension    Malignant neoplasm of lower-inner quadrant of left female breast (HCC)    Osteoporosis       Objective:     BP (!) 140/69 (BP Location: Right Arm)   Pulse (!) 102   Ht 4\' 11"  (1.499 m)   Wt 114 lb 12.8 oz (52.1 kg)   SpO2 93% Comment: RA  BMI 23.19 kg/m   SpO2: 93 % (RA) amb elderly wf/ smoker's rattle   HEENT : Oropharynx  clear   Nasal turbinates nl    NECK :  without  apparent JVD/ palpable Nodes/TM    LUNGS: no acc muscle use,  Min barrel  contour chest wall with bilateral  slightly decreased bs  wth min exp rhonchi  and  without cough on insp or exp maneuvers and min  Hyperresonant  to  percussion bilaterally    CV:  RRR  no s3 or murmur or increase in P2, and no edema   ABD:  soft and nontender with pos end  insp Hoover's  in the supine position.  No bruits or organomegaly appreciated   MS:  Nl gait/ ext warm without deformities Or obvious joint restrictions  calf tenderness, cyanosis or clubbing     SKIN: warm and dry without lesions    NEURO:  alert, approp, nl sensorium with  no motor or cerebellar deficits apparent.          I personally reviewed images and agree with radiology impression as follows:  CXR:   pa and lateral  03/18/23  1. Extensive postradiation fibrosis and scarring is again demonstrated in the lingula. This appears somewhat more dense than previously seen, suggesting the possibility of superimposed pneumonia. 2. Mild chronic interstitial lung disease.        Assessment   Asthmatic bronchitis , chronic (HCC) Active smoker - 06/11/2023   Walked on RA  x  3  lap(s) =  approx 450  ft  @ mod pace, stopped due  to end of study s sob  with lowest 02 sats 94%  - 06/11/2023   The proper method of use, as well as anticipated side effects, of a metered-dose inhaler were discussed and demonstrated to the patient using teach back method > use saba prn pending pfts   Re SABA :  I spent extra time with pt today reviewing appropriate use of albuterol  for prn use on exertion with the following points: 1)  saba is for relief of sob that does not improve by walking a slower pace or resting but rather if the pt does not improve after trying this first. 2) If the pt is convinced, as many are, that saba helps recover from activity faster then it's easy to tell if this is the case by re-challenging : ie stop, take the inhaler, then p 5 minutes try the exact same activity (intensity of workload) that just caused the symptoms and see if they are substantially diminished or not after saba 3) if there is an activity that reproducibly causes the symptoms, try the saba 15 min before the activity on alternate days   If in fact the saba really does help, then fine to continue to use it prn but advised may need to look closer at the maintenance regimen (for now =0) being used to achieve better control of airways disease with exertion.    >>> f/u pfts in 6 wks/ bring inhalers   Cigarette smoker Counseled re importance of smoking cessation but did not meet time criteria for separate billing     F/u in 3 months with pfts on return if possible  Each maintenance medication was reviewed in detail including emphasizing most importantly the difference between maintenance and prns and under what circumstances the prns are to be triggered using an action plan format where appropriate.  Total time for H and P, chart review, counseling, reviewing hfa device(s) , directly observing portions of ambulatory 02 saturation study/ and generating customized AVS unique to this office visit / same day charting = 45 min new pt eval                Vernestine Gondola, MD 06/11/2023

## 2023-06-11 ENCOUNTER — Encounter: Payer: Self-pay | Admitting: Internal Medicine

## 2023-06-11 ENCOUNTER — Ambulatory Visit: Admitting: Internal Medicine

## 2023-06-11 VITALS — BP 140/69 | HR 102 | Ht 59.0 in | Wt 114.8 lb

## 2023-06-11 DIAGNOSIS — F17210 Nicotine dependence, cigarettes, uncomplicated: Secondary | ICD-10-CM | POA: Diagnosis not present

## 2023-06-11 DIAGNOSIS — J4489 Other specified chronic obstructive pulmonary disease: Secondary | ICD-10-CM | POA: Insufficient documentation

## 2023-06-11 NOTE — Assessment & Plan Note (Signed)
 Active smoker - 06/11/2023   Walked on RA  x  3  lap(s) =  approx 450  ft  @ mod pace, stopped due to end of study s sob  with lowest 02 sats 94%  - 06/11/2023   The proper method of use, as well as anticipated side effects, of a metered-dose inhaler were discussed and demonstrated to the patient using teach back method > use saba prn pending pfts   Re SABA :  I spent extra time with pt today reviewing appropriate use of albuterol  for prn use on exertion with the following points: 1) saba is for relief of sob that does not improve by walking a slower pace or resting but rather if the pt does not improve after trying this first. 2) If the pt is convinced, as many are, that saba helps recover from activity faster then it's easy to tell if this is the case by re-challenging : ie stop, take the inhaler, then p 5 minutes try the exact same activity (intensity of workload) that just caused the symptoms and see if they are substantially diminished or not after saba 3) if there is an activity that reproducibly causes the symptoms, try the saba 15 min before the activity on alternate days   If in fact the saba really does help, then fine to continue to use it prn but advised may need to look closer at the maintenance regimen (for now =0) being used to achieve better control of airways disease with exertion.    >>> f/u pfts in 6 wks/ bring inhalers

## 2023-06-11 NOTE — Patient Instructions (Addendum)
 Only use your albuterol  as a rescue medication to be used if you can't catch your breath by resting or doing a relaxed purse lip breathing pattern.  - The less you use it, the better it will work when you need it. - Ok to use up to 2 puffs  every 4 hours if you must but call for immediate appointment if use goes up over your usual need - Don't leave home without it !!  (think of it like the spare tire for your car)   Also  Ok to try albuterol  15 min before an activity (on alternating days)  that you know would usually make you short of breath and see if it makes any difference and if makes none then don't take albuterol  after activity unless you can't catch your breath as this means it's the resting that helps, not the albuterol .     Work on inhaler technique:  relax and gently blow all the way out then take a nice smooth full deep breath back in, triggering the inhaler at same time you start breathing in.  Hold breath in for at least  5 seconds if you can.    The key is to stop smoking completely before smoking completely stops you!    My office will be contacting you by phone for referral for PFTs by time of return here in 3 months  - if you don't hear back from my office within one week please call us  back or notify us  thru MyChart and we'll address it right away.

## 2023-06-12 ENCOUNTER — Telehealth: Payer: Self-pay | Admitting: Internal Medicine

## 2023-06-12 DIAGNOSIS — F17210 Nicotine dependence, cigarettes, uncomplicated: Secondary | ICD-10-CM | POA: Insufficient documentation

## 2023-06-12 NOTE — Assessment & Plan Note (Signed)
 Counseled re importance of smoking cessation but did not meet time criteria for separate billing     F/u in 3 months with pfts on return if possible  Each maintenance medication was reviewed in detail including emphasizing most importantly the difference between maintenance and prns and under what circumstances the prns are to be triggered using an action plan format where appropriate.  Total time for H and P, chart review, counseling, reviewing hfa device(s) , directly observing portions of ambulatory 02 saturation study/ and generating customized AVS unique to this office visit / same day charting = 45 min new pt eval

## 2023-06-12 NOTE — Telephone Encounter (Signed)
 Spoke with patient regarding the 3 month f/u with Dr. Waymond Hailey and the PFT that was ordered--scheduled Tuesday 09/01/23 at 11:30 am---arrive at 11:15 1st floor registration desk at [facility] for check in follow up with Dr. Waymond Hailey is 09/01/23 at 1:00 pm.  Will mail information to patient and she voiced her understanding

## 2023-06-22 DIAGNOSIS — B351 Tinea unguium: Secondary | ICD-10-CM | POA: Diagnosis not present

## 2023-06-22 DIAGNOSIS — Z6820 Body mass index (BMI) 20.0-20.9, adult: Secondary | ICD-10-CM | POA: Diagnosis not present

## 2023-06-22 DIAGNOSIS — R030 Elevated blood-pressure reading, without diagnosis of hypertension: Secondary | ICD-10-CM | POA: Diagnosis not present

## 2023-07-21 IMAGING — DX DG CERVICAL SPINE 2 OR 3 VIEWS
4 series · 5 of 5 positions shown · non-contrast
Comparison: None.

CLINICAL DATA: Cervical spine pain.

EXAM:
CERVICAL SPINE - 2-3 VIEW

[c-spine lat]
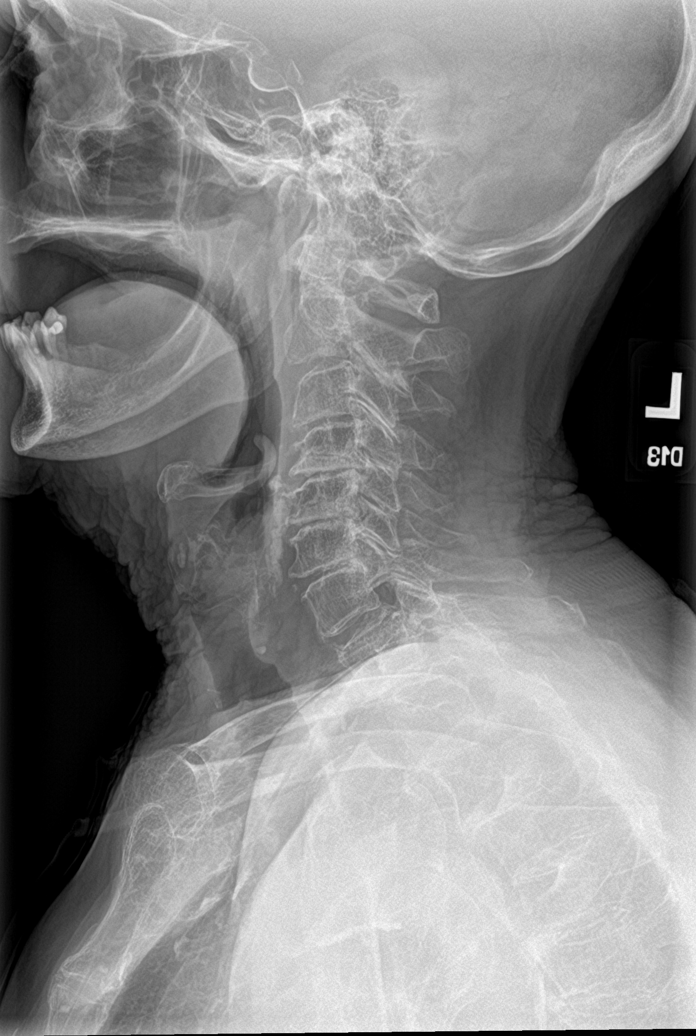

[c-spine ap]
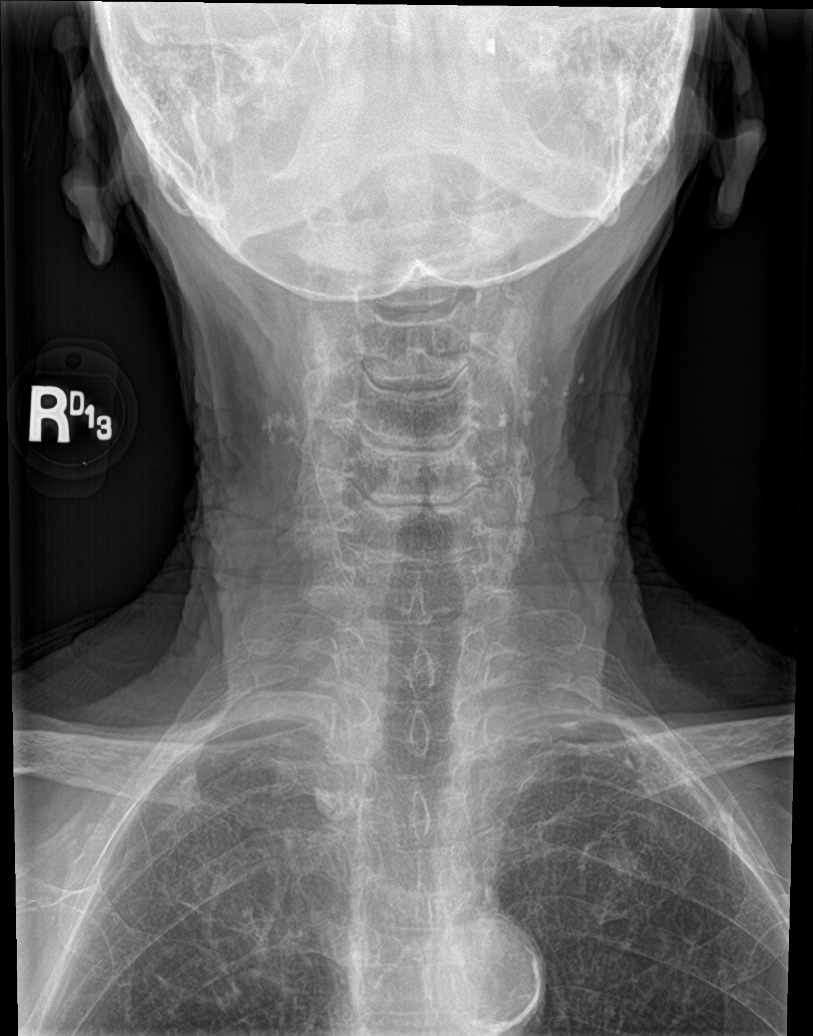

[Series 3: c-spine open mouth · 0.13mm/px · 2 of 2 slices shown]
[im 1/2]
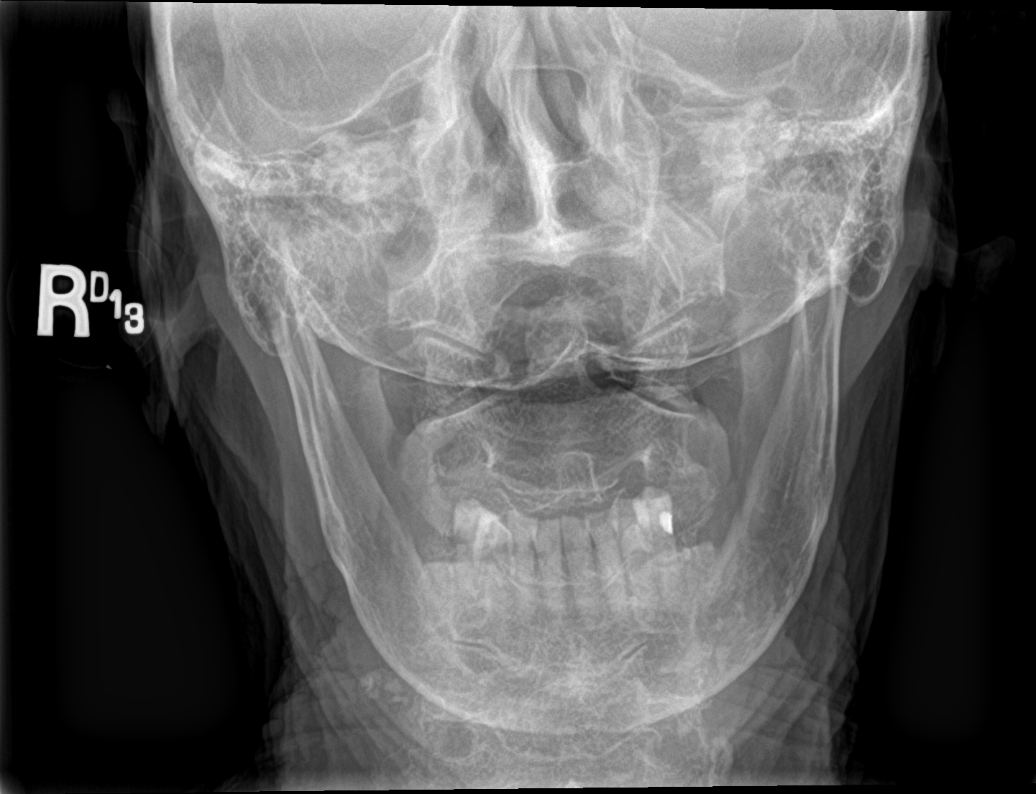
[im 2/2]
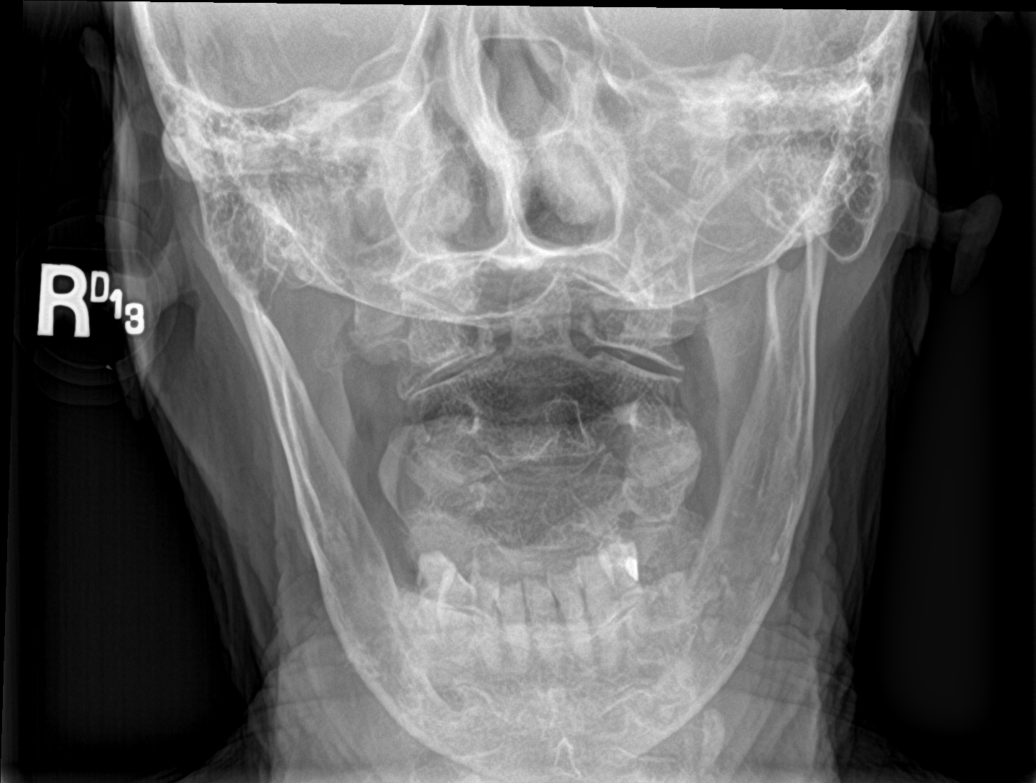

[c-spine swimmers trauma]
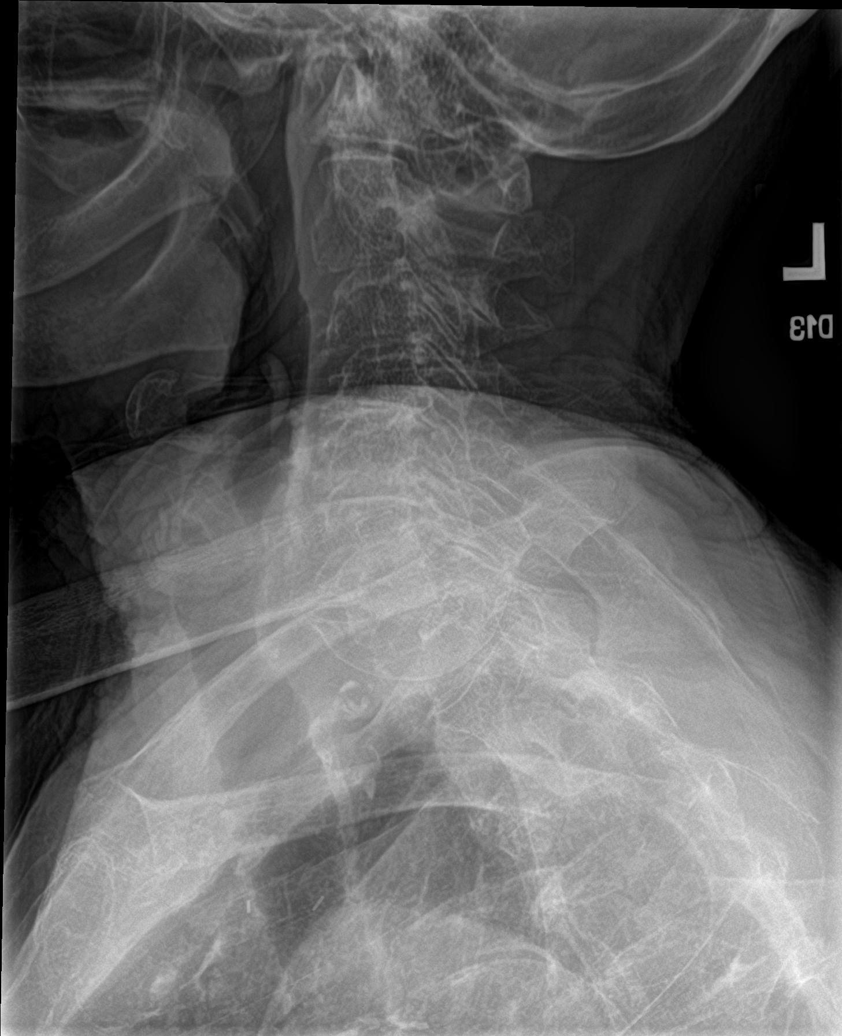

[5 of 5 positions shown; findings below may reference images not displayed]

FINDINGS: There is no evidence of cervical spine fracture or prevertebral soft
tissue swelling. Alignment is normal. There is moderate disc space
narrowing at C3-C4 and C6-C7. There is moderate severe disc space
narrowing at C4-C5 and C5-C6. Small endplate osteophytes are seen at
these levels. C1-C2 interval is within normal limits. There are
atherosclerotic calcifications in the neck bilaterally. The bones
are diffusely osteopenic.
IMPRESSION: 1. No acute fracture or malalignment.
2. Moderate severe degenerative changes of the cervical spine.

## 2023-07-30 DIAGNOSIS — E782 Mixed hyperlipidemia: Secondary | ICD-10-CM | POA: Diagnosis not present

## 2023-07-30 DIAGNOSIS — I2510 Atherosclerotic heart disease of native coronary artery without angina pectoris: Secondary | ICD-10-CM | POA: Diagnosis not present

## 2023-07-30 DIAGNOSIS — K219 Gastro-esophageal reflux disease without esophagitis: Secondary | ICD-10-CM | POA: Diagnosis not present

## 2023-07-30 DIAGNOSIS — I10 Essential (primary) hypertension: Secondary | ICD-10-CM | POA: Diagnosis not present

## 2023-08-20 ENCOUNTER — Inpatient Hospital Stay: Payer: Medicare Other | Attending: Hematology

## 2023-08-20 ENCOUNTER — Ambulatory Visit (HOSPITAL_COMMUNITY)
Admission: RE | Admit: 2023-08-20 | Discharge: 2023-08-20 | Disposition: A | Discharge status: Home / Self Care | Payer: Medicare Other | Source: Ambulatory Visit | Attending: Hematology | Admitting: Hematology

## 2023-08-20 DIAGNOSIS — Z9012 Acquired absence of left breast and nipple: Secondary | ICD-10-CM | POA: Diagnosis not present

## 2023-08-20 DIAGNOSIS — Z853 Personal history of malignant neoplasm of breast: Secondary | ICD-10-CM | POA: Insufficient documentation

## 2023-08-20 DIAGNOSIS — I3139 Other pericardial effusion (noninflammatory): Secondary | ICD-10-CM | POA: Diagnosis not present

## 2023-08-20 DIAGNOSIS — C50912 Malignant neoplasm of unspecified site of left female breast: Secondary | ICD-10-CM

## 2023-08-20 DIAGNOSIS — M810 Age-related osteoporosis without current pathological fracture: Secondary | ICD-10-CM | POA: Insufficient documentation

## 2023-08-20 DIAGNOSIS — J432 Centrilobular emphysema: Secondary | ICD-10-CM | POA: Diagnosis not present

## 2023-08-20 DIAGNOSIS — Z923 Personal history of irradiation: Secondary | ICD-10-CM | POA: Insufficient documentation

## 2023-08-20 DIAGNOSIS — R918 Other nonspecific abnormal finding of lung field: Secondary | ICD-10-CM | POA: Insufficient documentation

## 2023-08-20 LAB — COMPREHENSIVE METABOLIC PANEL WITH GFR
ALT: 13 U/L (ref 0–44)
AST: 20 U/L (ref 15–41)
Albumin: 4 g/dL (ref 3.5–5.0)
Alkaline Phosphatase: 71 U/L (ref 38–126)
Anion gap: 14 (ref 5–15)
BUN: 9 mg/dL (ref 8–23)
CO2: 21 mmol/L — ABNORMAL LOW (ref 22–32)
Calcium: 9.3 mg/dL (ref 8.9–10.3)
Chloride: 103 mmol/L (ref 98–111)
Creatinine, Ser: 0.85 mg/dL (ref 0.44–1.00)
GFR, Estimated: >60 mL/min (ref >60)
Glucose, Bld: 102 mg/dL — ABNORMAL HIGH (ref 70–99)
Potassium: 3.5 mmol/L (ref 3.5–5.1)
Sodium: 138 mmol/L (ref 135–145)
Total Bilirubin: 0.9 mg/dL (ref 0.0–1.2)
Total Protein: 7.7 g/dL (ref 6.5–8.1)

## 2023-08-20 LAB — CBC WITH DIFFERENTIAL/PLATELET
Abs Immature Granulocytes: 0.02 K/uL (ref 0.00–0.07)
Basophils Absolute: 0.1 K/uL (ref 0.0–0.1)
Basophils Relative: 1 %
Eosinophils Absolute: 0.2 K/uL (ref 0.0–0.5)
Eosinophils Relative: 2 %
HCT: 43.4 % (ref 36.0–46.0)
Hemoglobin: 14 g/dL (ref 12.0–15.0)
Immature Granulocytes: 0 %
Lymphocytes Relative: 28 %
Lymphs Abs: 2.4 K/uL (ref 0.7–4.0)
MCH: 30 pg (ref 26.0–34.0)
MCHC: 32.3 g/dL (ref 30.0–36.0)
MCV: 93.1 fL (ref 80.0–100.0)
Monocytes Absolute: 0.5 K/uL (ref 0.1–1.0)
Monocytes Relative: 5 %
Neutro Abs: 5.7 K/uL (ref 1.7–7.7)
Neutrophils Relative %: 64 %
Platelets: 310 K/uL (ref 150–400)
RBC: 4.66 MIL/uL (ref 3.87–5.11)
RDW: 13.4 % (ref 11.5–15.5)
WBC: 8.8 K/uL (ref 4.0–10.5)
nRBC: 0 % (ref 0.0–0.2)

## 2023-08-20 LAB — VITAMIN D 25 HYDROXY (VIT D DEFICIENCY, FRACTURES): Vit D, 25-Hydroxy: 25.38 ng/mL — ABNORMAL LOW (ref 30–100)

## 2023-08-27 ENCOUNTER — Inpatient Hospital Stay: Payer: Medicare Other | Admitting: Oncology

## 2023-08-27 VITALS — BP 133/64 | HR 85 | Temp 98.1°F | Resp 16 | Wt 113.8 lb

## 2023-08-27 DIAGNOSIS — C50912 Malignant neoplasm of unspecified site of left female breast: Secondary | ICD-10-CM | POA: Diagnosis not present

## 2023-08-27 DIAGNOSIS — Z171 Estrogen receptor negative status [ER-]: Secondary | ICD-10-CM | POA: Diagnosis not present

## 2023-08-27 DIAGNOSIS — Z853 Personal history of malignant neoplasm of breast: Secondary | ICD-10-CM | POA: Diagnosis not present

## 2023-08-27 DIAGNOSIS — R918 Other nonspecific abnormal finding of lung field: Secondary | ICD-10-CM

## 2023-08-27 DIAGNOSIS — Z1231 Encounter for screening mammogram for malignant neoplasm of breast: Secondary | ICD-10-CM

## 2023-08-27 DIAGNOSIS — M810 Age-related osteoporosis without current pathological fracture: Secondary | ICD-10-CM | POA: Diagnosis not present

## 2023-08-27 DIAGNOSIS — Z923 Personal history of irradiation: Secondary | ICD-10-CM | POA: Diagnosis not present

## 2023-08-27 DIAGNOSIS — Z9012 Acquired absence of left breast and nipple: Secondary | ICD-10-CM | POA: Diagnosis not present

## 2023-08-27 MED ORDER — VITAMIN D 25 MCG (1000 UNIT) PO TABS: 1000.0000 [IU] | ORAL_TABLET | Freq: Every day | ORAL | 3 refills | Status: AC

## 2023-08-27 NOTE — Progress Notes (Signed)
 [facility] OFFICE PROGRESS NOTE  Margaret Satterfield, MD  ASSESSMENT & PLAN:  Assessment & Plan Malignant neoplasm of left breast in female, estrogen receptor negative, unspecified site of breast (HCC) - Left mastectomy site is within normal limits.  Right breast has no palpable masses or adenopathy. - Recommend right breast screening mammogram annually.  Next due in February 2026.   Mass of lingula of lung -She continues to complain of pain in the left lateral chest wall in the infra axillary area and left shoulder with decreased range of motion, likely from previous surgery and radiation. -She is status post SBRT to left lung lesion from 04/28/2021 through 05/01/2021. - Most recent CT chest from 08/20/2023 showed similar postradiation scarring in the lingula and left lower lobe.  No evidence of disease progression.  Some stable tiny pulmonary nodules and small pericardial effusion.  Patulous esophagus with small hiatal hernia and similar gastric wall thickening.  Will consider correlation with endoscopic if clinically indicated. -Recommend CT chest in 6 months.  Osteoporosis, unspecified osteoporosis type, unspecified pathological fracture presence - She has multiple healing lower anterolateral left rib fractures which she may have sustained when she fell a year ago.  She will talk to Dr. Shona about options for osteoporosis.  Lung mass    Orders Placed This Encounter  Procedures   CT Chest Wo Contrast    Standing Status:   Future    Expected Date:   02/24/2024    Expiration Date:   08/26/2024    Preferred imaging location?:   [facility]   MM 3D SCREENING MAMMOGRAM UNILATERAL RIGHT BREAST    Standing Status:   Future    Expected Date:   02/27/2024    Expiration Date:   08/26/2024    Reason for Exam (SYMPTOM  OR DIAGNOSIS REQUIRED):   breast cancer screening    Preferred imaging location?:   [facility]   CBC with Differential    Standing Status:   Future     Expected Date:   02/21/2024    Expiration Date:   08/26/2024   Comprehensive metabolic panel    Standing Status:   Future    Expected Date:   02/21/2024    Expiration Date:   08/26/2024   VITAMIN D  25 Hydroxy (Vit-D Deficiency, Fractures)    Standing Status:   Future    Expected Date:   02/21/2024    Expiration Date:   08/26/2024    INTERVAL HISTORY: Patient returns for follow-up for left breast cancer, lung mass and osteoporosis.  She was recently hospitalized at [facility] from 03/19/2023 through 03/23/2023 for community-acquired pneumonia.  Reports some cough and shortness of breath from pneumonia.   Overall, she is doing well.  She had a screening mammogram in February and a CT chest last week.  Reports she is doing well overall.  Appetite is 75% energy levels are 25%.  Denies any significant changes since her last visit.  We reviewed CBC, CMP and vitamin D .  We also reviewed mammogram and CT chest from 08/21/2023.  SUMMARY OF HEMATOLOGIC HISTORY: -Left simple mastectomy by Margaret Thompson on 01/16/2017 showing a 1.8 cm primary tumor, grade 3, LVI positive, clear surgical margins, with high-grade DCIS.  Lymph nodes cannot be removed due to prior history of left breast cancer treatment. -PET scan on 03/27/2017 showed mildly hypermetabolic small prevascular lymph node and right lower peritracheal lymph node indeterminate.  Moderate metabolic activity associated with ill defined left upper  lobe nodule. -PET scan on 08/15/2017 showed mild reduction in size and activity of the left and left upper lobe pulmonary nodules, favoring benign etiology.  Stable small prevascular lymph nodes and reduced size and activity of the lower pretracheal lymph node.  Overall suggest reactive/inflammatory etiology. -Adjuvant chemotherapy was not recommended as more than 7 months have elapsed since her surgery. -She reports mild tenderness at the mastectomy site which is stable.  She also reports tightness at times. -She was given  a referral to remove her Port-A-Cath at her last visit.   No results found for: CBC  Vitals:   08/27/23 1507  BP: 133/64  Pulse: 85  Resp: 16  Temp: 98.1 F (36.7 C)  SpO2: 98%   ROS  Physical Exam Constitutional:      Appearance: Normal appearance.  Cardiovascular:     Rate and Rhythm: Normal rate and regular rhythm.  Pulmonary:     Effort: Pulmonary effort is normal.     Breath sounds: Normal breath sounds.  Abdominal:     General: Bowel sounds are normal.     Palpations: Abdomen is soft.  Musculoskeletal:        General: No swelling. Normal range of motion.  Neurological:     Mental Status: She is alert and oriented to person, place, and time. Mental status is at baseline.     I spent 25 minutes dedicated to the care of this patient (face-to-face and non-face-to-face) on the date of the encounter to include what is described in the assessment and plan.,  Delon Hope, NP 08/30/2023 10:04 AM

## 2023-08-27 NOTE — Assessment & Plan Note (Addendum)
-   Left mastectomy site is within normal limits.  Right breast has no palpable masses or adenopathy. - Recommend right breast screening mammogram annually.  Next due in February 2026.

## 2023-08-27 NOTE — Assessment & Plan Note (Addendum)
-  She continues to complain of pain in the left lateral chest wall in the infra axillary area and left shoulder with decreased range of motion, likely from previous surgery and radiation. -She is status post SBRT to left lung lesion from 04/28/2021 through 05/01/2021. - Most recent CT chest from 08/20/2023 showed similar postradiation scarring in the lingula and left lower lobe.  No evidence of disease progression.  Some stable tiny pulmonary nodules and small pericardial effusion.  Patulous esophagus with small hiatal hernia and similar gastric wall thickening.  Will consider correlation with endoscopic if clinically indicated. -Recommend CT chest in 6 months.

## 2023-08-30 NOTE — Progress Notes (Unsigned)
 Margaret Thompson, female    DOB: 1933/02/24    MRN: [id number]   Brief patient profile:  9  yowf  active smoker  referred to pulmonary clinic in [place]  06/11/2023 by Dr Marvine  for copd and  lung nodule LUL(lingula)  s/ p Radiation completed April 2023 in [place]  PET positive left lung nodule: - SBRT to the left lung lesion from 04/28/2021 through 05/01/2021, 5 fractions, 60 Elnor Rasmussen Jan 2024 and pain ever since L axilla with fx ribs on ct    History of Present Illness  06/11/2023  Pulmonary/ 1st office eval/ [place] / [place] [facility]  Chief Complaint  Patient presents with   Establish Care   COPD  Dyspnea:  [employer] all day ok,  steps are ok but  trouble doing house cleaning/ Cough: productive x white thick stuff  Sleep: on couch propped up on arm x 4-5 years  SABA use: has not using  02: has 02 / not needing  L ax Cp is positional, not pleuritic, with good shoulder mobility  Rec Only use your albuterol  as a rescue medication  Also  Ok to try albuterol  15 min before an activity (on alternating days)  that you know would usually make you short of breath Work on inhaler technique:  The key is to stop smoking completely before smoking completely stops you!  PFTs 09/01/2023  wnl       09/01/2023  f/u ov/[place] office/Susane Bey re: AB maint on no rx   Chief Complaint  Patient presents with   Asthma    PFT - today  Dyspnea:  no change  Cough: smoker's rattle  Sleeping:propped up on arm of cough with pillow x one  SABA use: none  02: none       No obvious day to day or daytime variability or assoc excess/ purulent sputum or mucus plugs or hemoptysis or cp or chest tightness, subjective wheeze or overt sinus or hb symptoms.    Also denies any obvious fluctuation of symptoms with weather or environmental changes or other aggravating or alleviating factors except as outlined above   No unusual exposure hx or h/o childhood pna/ asthma or knowledge of premature  birth.  Current Allergies, Complete Past Medical History, Past Surgical History, Family History, and Social History were reviewed in [employer] record.  ROS  The following are not active complaints unless bolded Hoarseness, sore throat, dysphagia, dental problems, itching, sneezing,  nasal congestion or discharge of excess mucus or purulent secretions, ear ache,   fever, chills, sweats, unintended wt loss or wt gain, classically pleuritic or exertional cp,  orthopnea pnd or arm/hand swelling  or leg swelling, presyncope, palpitations, abdominal pain, anorexia, nausea, vomiting, diarrhea  or change in bowel habits or change in bladder habits, change in stools or change in urine, dysuria, hematuria,  rash, arthralgias, visual complaints, headache, numbness, weakness or ataxia or problems with walking or coordination,  change in mood or  memory.        Current Meds  Medication Sig   albuterol  (VENTOLIN  HFA) 108 (90 Base) MCG/ACT inhaler Inhale 2 puffs into the lungs every 4 (four) hours as needed for wheezing or shortness of breath.   aspirin  81 MG chewable tablet Chew 81 mg by mouth in the morning.   atorvastatin  (LIPITOR) 40 MG tablet Take 40 mg by mouth daily.   diltiazem  (CARDIZEM  CD) 180 MG 24 hr capsule Take 180 mg by mouth daily.  Past Medical History:  Diagnosis Date   Acute MI (HCC) 2003   Adenocarcinoma of left breast (HCC) 11/23/2012   Stage II (T1 C. N1 M0) adenocarcinoma of the left breast with surgery on 03/08/2001. She had a 1.5 cm cancer with 1/6 positive sentinel nodes. Reexcision margins were clear. She did participate in NSABP B.-30 randomized to Adriamycin and Taxotere for 4 cycles followed by radiation therapy from 07/13/2001 to 08/27/2001. She then started Arimidex on 07/07/2001 took that until the end of June 2008. T   Anemia    Bilateral cataracts    Breast cancer (HCC)    GERD (gastroesophageal reflux disease)    History of gout     History of radiation therapy    Left lung- 04/18/21-05/01/21- Dr. Lynwood Nasuti   Hypercholesteremia    Hypertension    Malignant neoplasm of lower-inner quadrant of left female breast [facility])    Osteoporosis       Objective:    Wts   09/01/2023        114   08/27/23 113 lb 12.1 oz (51.6 kg)  06/11/23 114 lb 12.8 oz (52.1 kg)  03/23/23 115 lb 11.2 oz (52.5 kg)      Vital signs reviewed  09/01/2023  - Note at rest 02 sats  93% on RA   General appearance:    amb wf nad     HEENT : Oropharynx  clear       NECK :  without  apparent JVD/ palpable Nodes/TM    LUNGS: no acc muscle use,  Min barrel  contour chest wall with bilateral  slightly decreased bs s audible wheeze and  without cough on insp or exp maneuvers and min  Hyperresonant  to  percussion bilaterally    CV:  RRR  no s3 or murmur or increase in P2, and no edema   ABD:  soft and nontender with pos end  insp Hoover's  in the supine position.  No bruits or organomegaly appreciated   MS:  Nl gait/ ext warm without deformities Or obvious joint restrictions  calf tenderness, cyanosis or clubbing     SKIN: warm and dry without lesions    NEURO:  alert, approp, nl sensorium with  no motor or cerebellar deficits apparent.          Assessment

## 2023-09-01 ENCOUNTER — Encounter: Payer: Self-pay | Admitting: Internal Medicine

## 2023-09-01 ENCOUNTER — Ambulatory Visit: Admitting: Internal Medicine

## 2023-09-01 ENCOUNTER — Ambulatory Visit (HOSPITAL_COMMUNITY)
Admission: RE | Admit: 2023-09-01 | Discharge: 2023-09-01 | Disposition: A | Discharge status: Home / Self Care | Source: Ambulatory Visit | Attending: Internal Medicine | Admitting: Internal Medicine

## 2023-09-01 VITALS — BP 128/67 | HR 99 | Ht 59.0 in | Wt 114.0 lb

## 2023-09-01 DIAGNOSIS — J4489 Other specified chronic obstructive pulmonary disease: Secondary | ICD-10-CM

## 2023-09-01 DIAGNOSIS — F17210 Nicotine dependence, cigarettes, uncomplicated: Secondary | ICD-10-CM

## 2023-09-01 LAB — PULMONARY FUNCTION TEST
DL/VA % pred: 78 %
DL/VA: 3.32 ml/min/mmHg/L
DLCO cor % pred: 49 %
DLCO cor: 7.55 ml/min/mmHg
DLCO unc % pred: 50 %
DLCO unc: 7.69 ml/min/mmHg
FEF 25-75 Post: 0.72 L/s
FEF 25-75 Pre: 0.81 L/s
FEF2575-%Change-Post: -11 %
FEF2575-%Pred-Post: 100 %
FEF2575-%Pred-Pre: 113 %
FEV1-%Change-Post: -3 %
FEV1-%Pred-Post: 90 %
FEV1-%Pred-Pre: 93 %
FEV1-Post: 1.08 L
FEV1-Pre: 1.12 L
FEV1FVC-%Change-Post: 0 %
FEV1FVC-%Pred-Pre: 102 %
FEV6-%Change-Post: -3 %
FEV6-%Pred-Post: 94 %
FEV6-%Pred-Pre: 98 %
FEV6-Post: 1.45 L
FEV6-Pre: 1.51 L
FEV6FVC-%Pred-Post: 108 %
FEV6FVC-%Pred-Pre: 108 %
FVC-%Change-Post: -3 %
FVC-%Pred-Post: 87 %
FVC-%Pred-Pre: 91 %
FVC-Post: 1.46 L
FVC-Pre: 1.52 L
Post FEV1/FVC ratio: 74 %
Post FEV6/FVC ratio: 100 %
Pre FEV1/FVC ratio: 74 %
Pre FEV6/FVC Ratio: 100 %
RV % pred: 76 %
RV: 1.76 L
TLC % pred: 75 %
TLC: 3.23 L

## 2023-09-01 MED ORDER — ALBUTEROL SULFATE (2.5 MG/3ML) 0.083% IN NEBU
2.5000 mg | INHALATION_SOLUTION | Freq: Once | RESPIRATORY_TRACT | Status: AC
  Administered 2023-09-01: 2.5 mg via RESPIRATORY_TRACT

## 2023-09-01 NOTE — Assessment & Plan Note (Signed)
 Active smoker - 06/11/2023   Walked on RA  x  3  lap(s) =  approx 450  ft  @ mod pace, stopped due to end of study s sob  with lowest 02 sats 94% - 06/11/2023   The proper method of use, as well as anticipated side effects, of a metered-dose inhaler were discussed and demonstrated to the patient using teach back method > use saba prn pending pfts  - CT  08/20/23  Centrilobular and paraseptal emphysema.  - PFTs 09/01/2023 wnl s rx priors - 09/01/2023  After extensive coaching inhaler device,  effectiveness =    50% from a baseline of 25% so ok to use prn   Re SABA :  I spent extra time with pt today reviewing appropriate use of albuterol  for prn use on exertion with the following points: 1) saba is for relief of sob that does not improve by walking a slower pace or resting but rather if the pt does not improve after trying this first. 2) If the pt is convinced, as many are, that saba helps recover from activity faster then it's easy to tell if this is the case by re-challenging : ie stop, take the inhaler, then p 5 minutes try the exact same activity (intensity of workload) that just caused the symptoms and see if they are substantially diminished or not after saba 3) if there is an activity that reproducibly causes the symptoms, try the saba 15 min before the activity on alternate days   If in fact the saba really does help, then fine to continue to use it prn but advised may need to look closer at the maintenance regimen being used (for now =0) to achieve better control of airways disease with exertion.

## 2023-09-01 NOTE — Assessment & Plan Note (Signed)
 4-5 min discussion re active cigarette smoking in addition to office E&M  Ask about tobacco use:   ongoing  Advise quitting   I took an extended  opportunity with this patient to outline the consequences of continued cigarette use  in airway disorders based on all the data we have from the multiple national lung health studies (perfomed over decades at millions of dollars in cost)  indicating that smoking cessation, not choice of inhalers or pulmonary physicians, is the most important aspect of her  care and should improve her cough within 6 weeeks    Assess willingness:  Not committed at this point Assist in quit attempt:  Per PCP when ready Arrange follow up:   Follow up per Primary Care planned

## 2023-09-01 NOTE — Patient Instructions (Addendum)
 Use your albuterol  as a rescue medication to be used if you can't catch your breath by resting, slowing your pace,  or doing a relaxed purse lip breathing pattern.  - The less you use it, the better it will work when you need it. - Ok to use up to 2 puffs  every 4 hours if you must but call for  appointment if use goes up over your usual need - Don't leave home without it !!  (think of it like a spare tire or starter fluid for your car)   Also  Ok to try albuterol  15 min before an activity (on alternating days)  that you know would usually make you short of breath and see if it makes any difference and if makes none then don't take albuterol  after activity unless you can't catch your breath as this means it's the resting that helps, not the albuterol .  The key is to stop smoking completely before smoking completely stops you - it's definitely not too late          Pulmonary follow up is as needed

## 2023-09-06 ENCOUNTER — Ambulatory Visit: Payer: Self-pay | Admitting: Internal Medicine

## 2023-09-10 DIAGNOSIS — X32XXXD Exposure to sunlight, subsequent encounter: Secondary | ICD-10-CM | POA: Diagnosis not present

## 2023-09-10 DIAGNOSIS — L570 Actinic keratosis: Secondary | ICD-10-CM | POA: Diagnosis not present

## 2023-09-10 DIAGNOSIS — L728 Other follicular cysts of the skin and subcutaneous tissue: Secondary | ICD-10-CM | POA: Diagnosis not present

## 2023-11-27 ENCOUNTER — Ambulatory Visit
Admission: EM | Admit: 2023-11-27 | Discharge: 2023-11-27 | Disposition: A | Discharge status: Home / Self Care | Attending: Family Medicine | Admitting: Family Medicine

## 2023-11-27 DIAGNOSIS — H6011 Cellulitis of right external ear: Secondary | ICD-10-CM | POA: Diagnosis not present

## 2023-11-27 MED ORDER — AMOXICILLIN-POT CLAVULANATE 875-125 MG PO TABS: 1.0000 | ORAL_TABLET | Freq: Two times a day (BID) | ORAL | 0 refills | Status: AC

## 2023-11-27 NOTE — ED Triage Notes (Signed)
 Pt states right ear pain and itching for the past a few months.  States she saw her primary doctor a week ago and was given antibiotics but didn't take them because she is taking calcium  and it said on the label not to take with calcium .

## 2023-11-27 NOTE — ED Provider Notes (Signed)
 [facility]    CSN: [id number] Arrival date & time: 11/27/23  1505      History   Chief Complaint Chief Complaint  Patient presents with   Otalgia    HPI Margaret Thompson is a 89 y.o. female.   Patient presenting today with several month history of right outer ear pain and itching.  She saw her primary care about a week ago and was given a course of doxycycline  but did not take them as the pharmacist sent her a letter saying that she could not take it with her calcium  supplement.  Denies fever, chills, loss of hearing, bleeding or drainage, headache.  So far not trying anything over-the-counter for symptoms.    Past Medical History:  Diagnosis Date   Acute MI (HCC) 2003   Adenocarcinoma of left breast (HCC) 11/23/2012   Stage II (T1 C. N1 M0) adenocarcinoma of the left breast with surgery on 03/08/2001. She had a 1.5 cm cancer with 1/6 positive sentinel nodes. Reexcision margins were clear. She did participate in NSABP B.-30 randomized to Adriamycin and Taxotere for 4 cycles followed by radiation therapy from 07/13/2001 to 08/27/2001. She then started Arimidex on 07/07/2001 took that until the end of June 2008. T   Anemia    Bilateral cataracts    Breast cancer (HCC)    GERD (gastroesophageal reflux disease)    History of gout    History of radiation therapy    Left lung- 04/18/21-05/01/21- Dr. Lynwood Nasuti   Hypercholesteremia    Hypertension    Malignant neoplasm of lower-inner quadrant of left female breast [facility])    Osteoporosis     Patient Active Problem List   Diagnosis Date Noted   Cigarette smoker 06/12/2023   Asthmatic bronchitis , chronic (HCC) 06/11/2023   Emphysema with both acute and chronic bronchitis (HCC) 03/20/2023   Hypokalemia 03/19/2023   Acute hyponatremia 03/19/2023   Essential hypertension 03/19/2023   HLD (hyperlipidemia) 03/19/2023   CAP (community acquired pneumonia) 03/18/2023   Pain of both shoulder joints 01/26/2021   Mass of  lingula of lung 07/18/2020   Oral thrush 12/21/2019   Postprandial vomiting 09/21/2019   Abnormal LFTs 09/21/2019   Chondrodermatitis nodularis chronica helicis, 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   Constipation 11/30/2018   Preventative health care 05/26/2018   Abdominal pain, epigastric 11/24/2017   Odynophagia 06/04/2017   GERD (gastroesophageal reflux disease) 06/04/2017   Malignant neoplasm of left female breast (HCC)    Acute MI, 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   History of left breast cancer (2003) 11/23/2012   Bursitis, shoulder 05/25/2012    Past Surgical History:  Procedure Laterality Date   ABDOMINAL HYSTERECTOMY     BREAST SURGERY     CATARACT EXTRACTION W/PHACO Left 09/04/2014   Procedure: CATARACT EXTRACTION PHACO AND INTRAOCULAR LENS PLACEMENT (IOC);  Surgeon: Cherene Mania, MD;  Location: [facility];  Service: Ophthalmology;  Laterality: Left;  CDE:9.84   CATARACT EXTRACTION W/PHACO Right 11/09/2014   Procedure: CATARACT EXTRACTION PHACO AND INTRAOCULAR LENS PLACEMENT (IOC);  Surgeon: Cherene Mania, MD;  Location: [facility];  Service: Ophthalmology;  Laterality: Right;  CDE:8.40   CORONARY ANGIOPLASTY     RCA stent   ESOPHAGOGASTRODUODENOSCOPY (EGD) WITH PROPOFOL  N/A 08/04/2017   Fields: esophagitis, gastritis, no h.pylori. medium sized hiatal hernia, moderate schatzki ring s/p dilation   LEFT HEART CATHETERIZATION WITH CORONARY ANGIOGRAM N/A 12/21/2012   Procedure: LEFT HEART CATHETERIZATION WITH CORONARY ANGIOGRAM;  Surgeon: Rober LOISE Chroman, MD;  Location: [facility];  Service: Cardiovascular;  Laterality: N/A;   MASTECTOMY MODIFIED RADICAL Left 01/16/2017   Procedure: SIMPLE MASTECTOMY;  Surgeon: Mavis Anes, MD;  Location: [facility];  Service: General;  Laterality: Left;   MASTECTOMY, PARTIAL Left    PERCUTANEOUS STENT INTERVENTION     RCA   PORT-A-CATH REMOVAL     PORT-A-CATH REMOVAL Right 09/13/2018   Procedure: REMOVAL PORT-A-CATH;  Surgeon: Mavis Anes, MD;  Location: [facility];   Service: General;  Laterality: Right;   PORTACATH PLACEMENT     PORTACATH PLACEMENT Right 03/11/2017   Procedure: INSERTION PORT-A-CATH;  Surgeon: Mavis Anes, MD;  Location: [facility];  Service: General;  Laterality: Right;   SAVORY DILATION N/A 08/04/2017   Procedure: SAVORY DILATION;  Surgeon: Harvey Margo CROME, MD;  Location: [facility];  Service: Endoscopy;  Laterality: N/A;    OB History   No obstetric history on file.      Home Medications    Prior to Admission medications   Medication Sig Start Date End Date Taking? Authorizing Provider  amoxicillin -clavulanate (AUGMENTIN ) 875-125 MG tablet Take 1 tablet by mouth every 12 (twelve) hours. 11/27/23  Yes Stuart Vernell Norris, PA-C  albuterol  (VENTOLIN  HFA) 108 (90 Base) MCG/ACT inhaler Inhale 2 puffs into the lungs every 4 (four) hours as needed for wheezing or shortness of breath. 03/23/23   Danford, Lonni SQUIBB, MD  aspirin  81 MG chewable tablet Chew 81 mg by mouth in the morning.    [provider]  atorvastatin  (LIPITOR) 40 MG tablet Take 40 mg by mouth daily. 11/26/16   [provider]  cholecalciferol (VITAMIN D3) 25 MCG (1000 UNIT) tablet Take 1 tablet (1,000 Units total) by mouth daily. Patient not taking: Reported on 09/01/2023 08/27/23   Geofm Delon BRAVO, NP  diltiazem  (CARDIZEM  CD) 180 MG 24 hr capsule Take 180 mg by mouth daily. 03/09/20   [provider]  potassium chloride  (KLOR-CON ) 20 MEQ packet Take 20 mEq by mouth at bedtime. Patient not taking: Reported on 09/01/2023 03/23/23   Jonel Lonni SQUIBB, MD    Family History Family History  Problem Relation Age of Onset   Stroke Mother    Colon cancer Neg Hx    Gastric cancer Neg Hx    Esophageal cancer Neg Hx     Social History Social History   Tobacco Use   Smoking status: Every Day    Current packs/day: 0.50    Average packs/day: 0.5 packs/day for 50.0 years (25.0 ttl pk-yrs)    Types: Cigarettes    Passive exposure:  Current   Smokeless tobacco: Never   Tobacco comments:    10 cigarettes daily    Smoking Cessation Classes, Services & Resources Offered.  Vaping Use   Vaping status: Never Used  Substance Use Topics   Alcohol use: No   Drug use: No     Allergies   Azithromycin, Ivp dye [iodinated contrast media], Cipro [ciprofloxacin hcl], Gatifloxacin, and Levaquin  [levofloxacin ]   Review of Systems Review of Systems Per HPI  Physical Exam Triage Vital Signs ED Triage Vitals  Encounter Vitals Group     BP 11/27/23 1526 135/76     Girls Systolic BP Percentile --      Girls Diastolic BP Percentile --      Boys Systolic BP Percentile --      Boys Diastolic BP Percentile --      Pulse Rate 11/27/23 1526 96     Resp 11/27/23 1526 16     Temp 11/27/23  1526 98.2 F (36.8 C)     Temp Source 11/27/23 1526 Oral     SpO2 11/27/23 1526 94 %     Weight --      Height --      Head Circumference --      Peak Flow --      Pain Score 11/27/23 1523 3     Pain Loc --      Pain Education --      Exclude from Growth Chart --    No data found.  Updated Vital Signs BP 135/76 (BP Location: Right Arm)   Pulse 96   Temp 98.2 F (36.8 C) (Oral)   Resp 16   SpO2 94%   Visual Acuity Right Eye Distance:   Left Eye Distance:   Bilateral Distance:    Right Eye Near:   Left Eye Near:    Bilateral Near:     Physical Exam Vitals and nursing note reviewed.  Constitutional:      Appearance: Normal appearance. She is not ill-appearing.  HENT:     Head: Atraumatic.     Right Ear: Tympanic membrane normal.     Ears:     Comments: Auricle of right ear erythematous, edematous, tender to palpation.  No active bleeding or drainage.  EAC and TM benign on the right Eyes:     Extraocular Movements: Extraocular movements intact.     Conjunctiva/sclera: Conjunctivae normal.  Cardiovascular:     Rate and Rhythm: Normal rate.  Pulmonary:     Effort: Pulmonary effort is normal.  Musculoskeletal:         General: Normal range of motion.     Cervical back: Normal range of motion and neck supple.  Skin:    General: Skin is warm and dry.  Neurological:     Mental Status: She is alert and oriented to person, place, and time.  Psychiatric:        Mood and Affect: Mood normal.        Thought Content: Thought content normal.        Judgment: Judgment normal.      UC Treatments / Results  Labs (all labs ordered are listed, but only abnormal results are displayed) Labs Reviewed - No data to display  EKG   Radiology No results found.  Procedures Procedures (including critical care time)  Medications Ordered in UC Medications - No data to display  Initial Impression / Assessment and Plan / UC Course  I have reviewed the triage vital signs and the nursing notes.  Pertinent labs & imaging results that were available during my care of the patient were reviewed by me and considered in my medical decision making (see chart for details).     Cellulitis of right auricle.  She is uncomfortable taking the doxycycline  despite recommendation to space out from her supplements so we will start Augmentin  instead and discussed supportive over-the-counter medications and home care.  Return for worsening or unresolving symptoms.  Final Clinical Impressions(s) / UC Diagnoses   Final diagnoses:  Cellulitis of auricle of right ear     Discharge Instructions      Take the full course of antibiotics.  Keep the area clean, avoid scratching or picking at the area and you may apply Vaseline or Neosporin to the area as needed for protection    ED Prescriptions     Medication Sig Dispense Auth. Provider   amoxicillin -clavulanate (AUGMENTIN ) 875-125 MG tablet Take 1 tablet by mouth  every 12 (twelve) hours. 14 tablet Stuart Vernell Norris, [place]      PDMP not reviewed this encounter.   Stuart Vernell Norris, [place] 11/27/23 1605

## 2023-11-27 NOTE — Discharge Instructions (Signed)
 Take the full course of antibiotics.  Keep the area clean, avoid scratching or picking at the area and you may apply Vaseline or Neosporin to the area as needed for protection

## 2024-01-04 ENCOUNTER — Emergency Department (HOSPITAL_BASED_OUTPATIENT_CLINIC_OR_DEPARTMENT_OTHER): Admitting: Radiology

## 2024-01-04 ENCOUNTER — Emergency Department (HOSPITAL_BASED_OUTPATIENT_CLINIC_OR_DEPARTMENT_OTHER)
Admission: EM | Admit: 2024-01-04 | Discharge: 2024-01-04 | Disposition: A | Discharge status: Home / Self Care | Attending: Emergency Medicine | Admitting: Emergency Medicine

## 2024-01-04 ENCOUNTER — Other Ambulatory Visit: Payer: Self-pay

## 2024-01-04 ENCOUNTER — Encounter (HOSPITAL_BASED_OUTPATIENT_CLINIC_OR_DEPARTMENT_OTHER): Payer: Self-pay

## 2024-01-04 DIAGNOSIS — M549 Dorsalgia, unspecified: Secondary | ICD-10-CM | POA: Insufficient documentation

## 2024-01-04 DIAGNOSIS — M25552 Pain in left hip: Secondary | ICD-10-CM | POA: Diagnosis present

## 2024-01-04 DIAGNOSIS — Z7982 Long term (current) use of aspirin: Secondary | ICD-10-CM | POA: Insufficient documentation

## 2024-01-04 DIAGNOSIS — M5432 Sciatica, left side: Secondary | ICD-10-CM

## 2024-01-04 MED ORDER — HYDROCODONE-ACETAMINOPHEN 5-325 MG PO TABS: 1.0000 | ORAL_TABLET | Freq: Four times a day (QID) | ORAL | 0 refills | Status: AC | PRN

## 2024-01-04 MED ORDER — PREDNISONE 20 MG PO TABS
40.0000 mg | ORAL_TABLET | Freq: Once | ORAL | Status: AC
  Administered 2024-01-04: 40 mg via ORAL
  Filled 2024-01-04: qty 2

## 2024-01-04 MED ORDER — HYDROCODONE-ACETAMINOPHEN 5-325 MG PO TABS
2.0000 | ORAL_TABLET | Freq: Once | ORAL | Status: AC
  Administered 2024-01-04: 2 via ORAL
  Filled 2024-01-04: qty 2

## 2024-01-04 MED ORDER — PREDNISONE 10 MG PO TABS: 20.0000 mg | ORAL_TABLET | Freq: Two times a day (BID) | ORAL | 0 refills | Status: AC

## 2024-01-04 NOTE — Discharge Instructions (Signed)
 Begin taking prednisone  as prescribed.  Begin taking hydrocodone  as prescribed as needed for pain.  Follow-up with primary doctor if symptoms are not improving in the next week, and return to the ER if you develop any new and/or concerning issues.

## 2024-01-04 NOTE — ED Triage Notes (Signed)
 Left leg and groin pain. Radiates down leg. Cannot stand on it. Tender to the touch. Pulses in both feet found. No swelling in leg appreciated. Denies fall or traumatic injury.

## 2024-01-04 NOTE — ED Notes (Signed)
 Family comes out 3x asking about the wait. Apologized and informed them it is very jusy and only 2 providers for 40 people.

## 2024-01-04 NOTE — ED Provider Notes (Signed)
 " [facility] Provider Note   CSN: [id number] Arrival date & time: 01/04/24  8145     Patient presents with: Hip Pain (left)   Margaret Thompson is a 89 y.o. female.   Patient is an 89 year old female presenting with complaints of left back and hip pain.  Symptoms began 2 days ago in the absence of any injury or trauma.  Pain is worsened and she is now having difficulty ambulating.  Pain is located in her left posterior hip and radiates down her left leg.  She denies any weakness or numbness.  No bowel or bladder issues.  She has tried taking Tylenol  with little relief.       Prior to Admission medications  Medication Sig Start Date End Date Taking? Authorizing Provider  albuterol  (VENTOLIN  HFA) 108 (90 Base) MCG/ACT inhaler Inhale 2 puffs into the lungs every 4 (four) hours as needed for wheezing or shortness of breath. 03/23/23   Danford, Lonni SHAUNNA, MD  amoxicillin -clavulanate (AUGMENTIN ) 875-125 MG tablet Take 1 tablet by mouth every 12 (twelve) hours. 11/27/23   Stuart Vernell Norris, PA-C  aspirin  81 MG chewable tablet Chew 81 mg by mouth in the morning.    [provider]  atorvastatin  (LIPITOR) 40 MG tablet Take 40 mg by mouth daily. 11/26/16   [provider]  cholecalciferol (VITAMIN D3) 25 MCG (1000 UNIT) tablet Take 1 tablet (1,000 Units total) by mouth daily. Patient not taking: Reported on 09/01/2023 08/27/23   Geofm Delon BRAVO, NP  diltiazem  (CARDIZEM  CD) 180 MG 24 hr capsule Take 180 mg by mouth daily. 03/09/20   [provider]  potassium chloride  (KLOR-CON ) 20 MEQ packet Take 20 mEq by mouth at bedtime. Patient not taking: Reported on 09/01/2023 03/23/23   Jonel Lonni SHAUNNA, MD    Allergies: Azithromycin, Ivp dye [iodinated contrast media], Cipro [ciprofloxacin hcl], Gatifloxacin, and Levaquin  [levofloxacin ]    Review of Systems  All other systems reviewed and are negative.   Updated Vital  Signs BP (!) 148/72   Pulse 88   Temp 98.6 F (37 C) (Oral)   Resp 18   SpO2 100%   Physical Exam Vitals and nursing note reviewed.  HENT:     Head: Normocephalic.  Pulmonary:     Effort: Pulmonary effort is normal.  Musculoskeletal:     Comments: The left hip is grossly normal in appearance.  She does have some pain with range of motion.  DP pulses are palpable and motor and sensation are intact throughout the entire foot.  Skin:    General: Skin is warm and dry.  Neurological:     Mental Status: She is alert and oriented to person, place, and time.     (all labs ordered are listed, but only abnormal results are displayed) Labs Reviewed - No data to display  EKG: None  Radiology: DG Hip Unilat W or Wo Pelvis 2-3 Views Left Result Date: 01/04/2024 EXAM: 2 or 3 VIEW(S) XRAY OF THE LEFT HIP 01/04/2024 07:55:00 PM COMPARISON: Full body PET CT 12/13/2020. CLINICAL HISTORY: bony tenderness bony tenderness FINDINGS: BONES AND JOINTS: Osteopenia. No evidence of fracture or dislocation. Mild degenerative changes of the hip joints, SI joints, and symphysis pubis. Degenerative disc disease in the visualized lower thoracic spine. SOFT TISSUES: Bilateral femoral arterial calcific plaque. IMPRESSION: 1. No acute fracture or dislocation. 2. Osteopenia and degenerative change. Electronically signed by: Francis Quam MD 01/04/2024 08:13 PM EST RP Workstation: [id number]  Procedures   Medications Ordered in the ED  HYDROcodone -acetaminophen  (NORCO/VICODIN) 5-325 MG per tablet 2 tablet (has no administration in time range)  predniSONE  (DELTASONE ) tablet 40 mg (has no administration in time range)                                    Medical Decision Making Risk Prescription drug management.   Patient presenting with hip and back pain most consistent with sciatica.  Her hip x-rays are negative with the exception of degenerative changes.  I will treat with prednisone  and pain  medication and see if this helps.  Patient is to follow-up with primary doctor if symptoms persist.     Final diagnoses:  None    ED Discharge Orders     None          Geroldine Berg, MD 01/04/24 2327  "

## 2024-01-12 ENCOUNTER — Emergency Department (HOSPITAL_COMMUNITY)

## 2024-01-12 ENCOUNTER — Other Ambulatory Visit: Payer: Self-pay

## 2024-01-12 ENCOUNTER — Encounter (HOSPITAL_COMMUNITY): Payer: Self-pay

## 2024-01-12 ENCOUNTER — Emergency Department (HOSPITAL_COMMUNITY)
Admission: EM | Admit: 2024-01-12 | Discharge: 2024-01-12 | Disposition: A | Discharge status: Home / Self Care | Source: Ambulatory Visit | Attending: Emergency Medicine | Admitting: Emergency Medicine

## 2024-01-12 DIAGNOSIS — X509XXA Other and unspecified overexertion or strenuous movements or postures, initial encounter: Secondary | ICD-10-CM | POA: Insufficient documentation

## 2024-01-12 DIAGNOSIS — Z7982 Long term (current) use of aspirin: Secondary | ICD-10-CM | POA: Diagnosis not present

## 2024-01-12 DIAGNOSIS — S79912A Unspecified injury of left hip, initial encounter: Secondary | ICD-10-CM | POA: Diagnosis present

## 2024-01-12 DIAGNOSIS — S72002A Fracture of unspecified part of neck of left femur, initial encounter for closed fracture: Secondary | ICD-10-CM

## 2024-01-12 DIAGNOSIS — S72045A Nondisplaced fracture of base of neck of left femur, initial encounter for closed fracture: Secondary | ICD-10-CM | POA: Insufficient documentation

## 2024-01-12 DIAGNOSIS — Z79899 Other long term (current) drug therapy: Secondary | ICD-10-CM | POA: Diagnosis not present

## 2024-01-12 DIAGNOSIS — Z853 Personal history of malignant neoplasm of breast: Secondary | ICD-10-CM | POA: Diagnosis not present

## 2024-01-12 DIAGNOSIS — I10 Essential (primary) hypertension: Secondary | ICD-10-CM | POA: Insufficient documentation

## 2024-01-12 LAB — CBC WITH DIFFERENTIAL/PLATELET
Abs Immature Granulocytes: 0.13 K/uL — ABNORMAL HIGH (ref 0.00–0.07)
Basophils Absolute: 0 K/uL (ref 0.0–0.1)
Basophils Relative: 0 %
Eosinophils Absolute: 0 K/uL (ref 0.0–0.5)
Eosinophils Relative: 0 %
HCT: 42.8 % (ref 36.0–46.0)
Hemoglobin: 13.9 g/dL (ref 12.0–15.0)
Immature Granulocytes: 1 %
Lymphocytes Relative: 17 %
Lymphs Abs: 2.5 K/uL (ref 0.7–4.0)
MCH: 30.6 pg (ref 26.0–34.0)
MCHC: 32.5 g/dL (ref 30.0–36.0)
MCV: 94.3 fL (ref 80.0–100.0)
Monocytes Absolute: 1.1 K/uL — ABNORMAL HIGH (ref 0.1–1.0)
Monocytes Relative: 8 %
Neutro Abs: 10.5 K/uL — ABNORMAL HIGH (ref 1.7–7.7)
Neutrophils Relative %: 74 %
Platelets: 350 K/uL (ref 150–400)
RBC: 4.54 MIL/uL (ref 3.87–5.11)
RDW: 12.9 % (ref 11.5–15.5)
WBC: 14.3 K/uL — ABNORMAL HIGH (ref 4.0–10.5)
nRBC: 0 % (ref 0.0–0.2)

## 2024-01-12 LAB — BASIC METABOLIC PANEL WITH GFR
Anion gap: 9 (ref 5–15)
BUN: 20 mg/dL (ref 8–23)
CO2: 32 mmol/L (ref 22–32)
Calcium: 9 mg/dL (ref 8.9–10.3)
Chloride: 100 mmol/L (ref 98–111)
Creatinine, Ser: 0.74 mg/dL (ref 0.44–1.00)
GFR, Estimated: >60 mL/min
Glucose, Bld: 106 mg/dL — ABNORMAL HIGH (ref 70–99)
Potassium: 3 mmol/L — ABNORMAL LOW (ref 3.5–5.1)
Sodium: 141 mmol/L (ref 135–145)

## 2024-01-12 NOTE — ED Triage Notes (Signed)
 Pt gives verbal consent for mse

## 2024-01-12 NOTE — ED Notes (Signed)
 Patient is A&Ox4 upon discharge. Patient verbalized understanding of discharge instructions and follow-up care. Patient wheeled from ED .

## 2024-01-12 NOTE — Discharge Instructions (Signed)
 You were seen in the emerged ferment for a left hip fracture The x-ray and CAT scan did show a left hip fracture We spoke about this with Dr. Sharl who is an orthopedic surgeon You did not wish to stay in the hospital overnight You need to call his office first thing tomorrow morning and schedule an appointment to be seen You can take the previously prescribed Norco as directed for severe pain Do not drink alcohol or drive while taking this medication Take Tylenol  and Motrin as directed for mild to moderate pain Use your walker to offload some of the weight on your left leg Return to the emerged part for severe pain repeated falls or other concerns

## 2024-01-12 NOTE — ED Provider Notes (Signed)
 " [place] EMERGENCY DEPARTMENT AT [facility] Provider Note   CSN: [id number] Arrival date & time: 01/12/24  1501     Patient presents with: Hip Pain (Fracture/)   Margaret Thompson is a 89 y.o. female.  With a history of breast cancer hypertension hyperlipidemia and MI who presents to the ED for left hip pain.  Patient reports that she turned and twisted the wrong way on December 22 and has had pain in the left hip since then.  X-ray taken in the emergency department at that time was negative however she was still having left hip pain and another x-ray was taken as ordered by her PCP today which showed concern for left hip fracture she was directed here for further evaluation.  Still having some discomfort there.  No other injuries or complaints at this time    Hip Pain       Prior to Admission medications  Medication Sig Start Date End Date Taking? Authorizing Provider  albuterol  (VENTOLIN  HFA) 108 (90 Base) MCG/ACT inhaler Inhale 2 puffs into the lungs every 4 (four) hours as needed for wheezing or shortness of breath. 03/23/23   Danford, Lonni SHAUNNA, MD  amoxicillin -clavulanate (AUGMENTIN ) 875-125 MG tablet Take 1 tablet by mouth every 12 (twelve) hours. 11/27/23   Stuart Vernell Norris, PA-C  aspirin  81 MG chewable tablet Chew 81 mg by mouth in the morning.    [provider]  atorvastatin  (LIPITOR) 40 MG tablet Take 40 mg by mouth daily. 11/26/16   [provider]  cholecalciferol (VITAMIN D3) 25 MCG (1000 UNIT) tablet Take 1 tablet (1,000 Units total) by mouth daily. Patient not taking: Reported on 09/01/2023 08/27/23   Geofm Delon BRAVO, NP  diltiazem  (CARDIZEM  CD) 180 MG 24 hr capsule Take 180 mg by mouth daily. 03/09/20   [provider]  HYDROcodone -acetaminophen  (NORCO/VICODIN) 5-325 MG tablet Take 1-2 tablets by mouth every 6 (six) hours as needed. 01/04/24   Geroldine Berg, MD  potassium chloride  (KLOR-CON ) 20 MEQ packet Take 20 mEq by  mouth at bedtime. Patient not taking: Reported on 09/01/2023 03/23/23   Jonel Lonni SHAUNNA, MD  predniSONE  (DELTASONE ) 10 MG tablet Take 2 tablets (20 mg total) by mouth 2 (two) times daily with a meal. 01/04/24   Geroldine Berg, MD    Allergies: Azithromycin, Ivp dye [iodinated contrast media], Cipro [ciprofloxacin hcl], Gatifloxacin, and Levaquin  [levofloxacin ]    Review of Systems  Updated Vital Signs BP (!) 140/62 (BP Location: Right Arm)   Pulse 73   Temp 97.7 F (36.5 C)   Resp 12   SpO2 95%   Physical Exam Vitals and nursing note reviewed.  HENT:     Head: Normocephalic and atraumatic.  Eyes:     Pupils: Pupils are equal, round, and reactive to light.  Cardiovascular:     Rate and Rhythm: Normal rate and regular rhythm.  Pulmonary:     Effort: Pulmonary effort is normal.     Breath sounds: Normal breath sounds.  Abdominal:     Palpations: Abdomen is soft.     Tenderness: There is no abdominal tenderness.  Musculoskeletal:     Comments: Tenderness over left anterior and lateral hip with the palpation Motor and sensation intact throughout bilateral upper and lower extremities  Skin:    General: Skin is warm and dry.  Neurological:     Mental Status: She is alert.  Psychiatric:        Mood and Affect: Mood normal.     (  all labs ordered are listed, but only abnormal results are displayed) Labs Reviewed  CBC WITH DIFFERENTIAL/PLATELET - Abnormal; Notable for the following components:      Result Value   WBC 14.3 (*)    Neutro Abs 10.5 (*)    Monocytes Absolute 1.1 (*)    Abs Immature Granulocytes 0.13 (*)    All other components within normal limits  BASIC METABOLIC PANEL WITH GFR - Abnormal; Notable for the following components:   Potassium 3.0 (*)    Glucose, Bld 106 (*)    All other components within normal limits  CBC WITH DIFFERENTIAL/PLATELET    EKG: None  Radiology: DG Pelvis 1-2 Views Result Date: 01/12/2024 EXAM: 1 or 2 VIEW(S) XRAY OF THE  PELVIS 01/12/2024 08:25:00 PM COMPARISON: Left hip radiograph dated 01/04/2024. CLINICAL HISTORY: Left hip fracture. FINDINGS: BONES AND JOINTS: Apparent focal area of discontinuity and angulation of the left femoral neck suspicious for nondisplaced fracture. Correlation with CT is recommended. The bones are of sphenoid. Moderate arthritic changes of the hips. No malalignment. SOFT TISSUES: The soft tissues are unremarkable. IMPRESSION: 1. Findings suspicious for a nondisplaced left femoral neck fracture . Further evaluation with CT is recommended. Electronically signed by: Vanetta Chou MD 01/12/2024 09:43 PM EST RP Workstation: [id number]     Procedures   Medications Ordered in the ED - No data to display  Clinical Course as of 01/12/24 2237  Tue Jan 12, 2024  2121 rogers [MP]    Clinical Course User Index [MP] Pamella Ozell LABOR, DO                                 Medical Decision Making 89 year old female with history as above presenting for what sounds like a subacute left hip fracture sustained sometime around Christmas.  Negative x-rays on the 22nd but x-ray taken today at [facility] showed left hip fracture.  X-ray here redemonstrates left femoral neck fracture.  I discussed this with Dr.Rogers who recommends CT of the left hip.  Discussed rationale for admission versus discharge with close orthopedic follow-up.  At this time patient is certain she does not want to stay in the hospital.  Discussed this with Dr.  Sharl who does feel Patient would be appropriate for discharge home with plan for ambulation with walker to offload some of the weight of the left lower extremity and close follow-up with his office.  She will call his office tomorrow  Amount and/or Complexity of Data Reviewed Radiology: ordered.        Final diagnoses:  Closed fracture of neck of left femur, initial encounter [facility])    ED Discharge Orders     None          Pamella Ozell LABOR, DO 01/12/24 2237  "

## 2024-01-12 NOTE — ED Provider Triage Note (Signed)
 Emergency Medicine Provider Triage Evaluation Note  Margaret Thompson , a 89 y.o. female  was evaluated in triage.  Pt complains of hip fracture. The patient has been having left hip pain and has been having to walk with a walker for a week. Was seen at [facility] and was told she has a left hip fracture. Denies any falls  Review of Systems  Positive:  Negative:   Physical Exam  BP (!) 120/49 (BP Location: Right Arm)   Pulse 70   Temp 98 F (36.7 C) (Oral)   Resp 18   SpO2 98%  Gen:   Awake, no distress   Resp:  Normal effort  MSK:   Moves extremities without difficulty  Other:  Palpable distal pulses. Reports sensation is symmetric and intact.   Medical Decision Making  Medically screening exam initiated at 7:02 PM.  Appropriate orders placed.  Margaret Thompson was informed that the remainder of the evaluation will be completed by another provider, this initial triage assessment does not replace that evaluation, and the importance of remaining in the ED until their evaluation is complete.  Will re-order XR and basic labs for admission.    Margaret Thompson, [place] 01/12/24 8091

## 2024-01-12 NOTE — ED Notes (Signed)
"  Blood redrawn and sent to lab  "

## 2024-01-12 NOTE — ED Notes (Signed)
 Patient transported to X-ray

## 2024-01-12 NOTE — ED Triage Notes (Addendum)
 Pt arrives via POV. Pt had an xray of her left hip at [facility] yesterday. She reports she was told to come to the ED today due to having a fracture. Unsure how she broke it, but she reports left hip pain for the past week.

## 2024-01-12 NOTE — ED Notes (Signed)
 Call from lab-- lav and light green hemolyzed

## 2024-01-16 NOTE — Unmapped External Note (Signed)
 Acute Occupational Therapy Evaluation   Visit Count: OT Visit Count: 1  Precautions: Weight Bearing Status: Yes LLE: WBAT (Posterior hip precautions.) Other Therapy Precautions: Posterior Hip, Fall risk   Medical Diagnosis/Course: OT Diagnosis/Course Pertinent Medical Course: Patient is an 89 yo female with PMH significant for breast cancer in remission, CAD s/p DES to RCA (2014), HTN. Patient admit 01/14/24 with L supcapital femoral neck fracture after sitting oddly on her couch on 12/20.  She has been ambulating with walker for 2 weeks since injury. Patient is s/p L hip hemiarthroplasty on 1/2.   Assessment   Assessment:  Margaret Thompson is a 89 y.o. admitted on 01/14/2024.   Pt is a 89 y.o. female admitted for see details above.  Pts pertinent history includes see details below.  Pt demonstrates therapy problems listed below contributing to performance deficits in  Bathing, Toileting and toileting hygiene, Dressing, and Functional mobility.  Pt required moderate modifications to complete assessment secondary to precautions and impairments listed below.  Pt scored 16/24 on the [facility] AM-PAC 6-clicks Daily Activity Inpatient Short Form indicating 53.32% impairment.   Pt encountered in supine and PTA required assistance w/ IADLs. Pt a/o x4. Pt performed bed mobility w/ min A, LB dressing w/ max A-dependent, and functional mobility w/ RW and CGA. Pt demonstrated good-fair balance and required no more than CGA for safety/support. Pt demonstrated impairments w/ functional mobility, strength, endurance, balance, endurance, and BLE GM coordination, impacting pts overall ADL performance. Pt functioning below baseline and for optimal recovery she would benefit from skilled acute OT services to address stated impairments, with goal of improving activity limitations and overall function. OT education and skilled intervention listed below  OT Treatment Diagnosis: Difficulty Walking (not  elsewhere specified) , Needing assistance w/ self-care, Other Lack of Coordination , Unsteadiness on feet , and impaired ADL performance  Problem List:  Impairments/Limitations: Balance Deficits, Activity Tolerance Deficits, Gross Motor deficits, Basic Activity of Daily living deficits, IADL deficits, Mobility deficits, Muscle weakness, Pain limiting function, Coordination Deficits  OT Recommendation: Recommendation OT Recommendation: Rehabilitation Facility OT Equipment Recommended: Defer to next level of care  Prior Functional Status   Home Living Environment: Type of Home House  Home Layout One level  Exterior Stairs - rails None  Exterior Stairs - number 1+1  Interior Stairs - rails    Interior Stairs - number    Shower/Tub Tub/Shower Unit (does not use; performs sink baths)  Oceanographer Regular toilet height, Grab bars around Chartered Loss Adjuster, Cane-single point  Equipment Currently Using Independent without AD at baseline, reports using RW x 2 weeks prior to admission secondary to pan following hip fracture,  Additional Comments     Prior Level of Function:  Level of Independence Requiring assistance  Lives With Alone  Person(s) able to assist at d/c 2 daughters live next door and can provide increased assist/supervision upon discharge  Patient Responsibilities  Personal ADLs, Manage Finances, Manage Medications (daughters assist with transportation)  Requires Assist With  Home Management    Plan   OT Goals: Patient and Family Goals: return to PLOF Treatment Plan/Goals Established with Patient/Caregiver: Yes Encounter Problems     Encounter Problems (Active)     Occupational Therapy     Patient will perform grooming Independently at sink     Start:  01/16/24    Expected End:  01/30/24         Patient will perform lower body  dressing with minimal assistance w/ AE     Start:  01/16/24    Expected End:   01/30/24         Patient will perform toileting with contact guard assistance     Start:  01/16/24    Expected End:  01/30/24             Patient will perform toilet transfer with distant supervision to Toilet     Start:  01/16/24    Expected End:  01/30/24             Patient will perform tub/shower transfer with distant supervision with shower chair     Start:  01/16/24    Expected End:  01/30/24         Patient/caregiver will demonstrate precautions independently     Start:  01/16/24    Expected End:  01/30/24             Patient/caregiver will be able to verbalize 2 fall prevention strategies to decrease risk of falls during functional mobility     Start:  01/16/24    Expected End:  01/30/24         Patient's ADLs Inpatient Short Form score will increase to 19/24 to indicate improved function.      Start:  01/16/24    Expected End:  01/30/24             Rehab Potential: Rehab Potential Rehab Potential: Good Complexity/Co-morbidities that Impact POC: Weight bearing restrictions, Reduced activity level, Severity of condition, Time since onset/Exacerbation Impairments/Limitations: Balance Deficits, Activity Tolerance Deficits, Gross Motor deficits, Basic Activity of Daily living deficits, IADL deficits, Mobility deficits, Muscle weakness, Pain limiting function, Coordination Deficits  Plan:  Patient's Prior Level of Function Requiring assistance Needs a Little Help (AMPAC 12-21)    (2pts)   Patient's Current Level of Function (16) Needs a Little Help (AMPAC 12-21)    (2pts)   Likelihood for functional decline without therapy .  Fairly Likely    (2pts)  Likelihood  to make significant functional gains with therapy. Fairly Likely    (2pts)  Acute therapy likely to change discharge disposition. Fairly Likely    (2pts)   Recommended Frequency: 3-4 times a week (8-12 points total)  OT Frequency: 3x week  Recommend Duration:  OT Duration: For 4  weeks  Recommend Interventions: Planned Treatment/Interventions: Basic activities of daily living, Patient/Caregiver education, Instrumental Activities of Daily Living, Therapeutic activities, Therapeutic exercises, Equipment training, Functional Mobility training, Energy Conservation training   SUBJECTIVE  Team Communication: Interdisciplinary Team Communication Communication: Patient, Nursing, PT   General Family/Caregiver Details: none present  Subjective Fall History Fall in the last year?: Yes Fall/Injury Details: reports episode of dizziness morning of incident - reports fearful of falling on wood stove so she turned quickly to sit on couch Feel unsteady or off balance?: Yes  Pain: Pain Assessment Pain Assessment: No/denies pain  OBJECTIVE  Vitals:  Vital signs chart reviewed prior to session and within normal limits. No signs or symptoms of unstable vitals during session and did not further assess.  Cognition:  Cognition Orientation Level: Oriented x4 Overall Cognitive Status: Within Functional Limits Executive Functioning: Intact Comments: O-Log: 29/30   Communication: WFL  Vision/Perception and Hearing  Current Vision: Wears glasses for reading only Did patient use glasses during this session?: No Vision Screen: Within Functional Limits Perception: Within Functional Limits Hearing: Within Functional Limits   Skin Integrity and Edema:  Skin Integrity & Edema Skin:  Intact except surgical incision(s) Edema: Other (comment) Part of Body: LLE post-surgical edema   ROM:  ROM Assessment Overall ROM Assessment: Bilateral UEs WFL  Strength:  Strength Assessment Overall Strength Assessment: Bilateral UEs WFL  Tone Assessment Tone Assessment: Within Functional Limits  Sensation Parasthesia: denies Numbness: denies  Proprioception:  Intact unless otherwise stated below.      Coordination:   Coordination Other Coordination Impairments: R Heel to [place],  L Heel to [place] Additional Comments: UE WFL   Balance:  Sitting - Static: Close supervision Sitting - Dynamic: Close supervision Standing - Static: Close supervision Standing - Dynamic: Close supervision, Contact Guard Assistance Balance Additional Comments: w/ RW demonstrated no overt LOB  Bed Mobility and Transfers: Supine to Sit: Minimal Assistance, Extra time, Verbal Cues Sit to Supine: Minimal Assistance, Extra time Bed Mobility Additional Comments: VC for precautions, BLE management and trunk faciliation ito uprigth. VC for task sequencing Assistive Device: Walker-rolling Sit to Stand: [place], Extra time, Verbal Cues Stand to Sit: [place], Extra time, Verbal Cues Transfers  Additional Comments: VC for hand placement and body mechanics for improved STS. functional mobility from EOB to sink x2 w/ RW to progress OOB ADL performance. Gait Distance (ft): 22 ft  ADLs: ADLs Feeding: Set up LB Dressing: Dependent, Verbal Cues (don socks, and education on AE w/ VC) LB Dressing Level: Sitting, Edge of bed Assistive Device: Walker-rolling  Interventions: Self care: See the content above for further detail regarding skilled interventions provided: LB dressing, bed mobility, and functional mobility. Patient benefits from cueing verbal cueing for safe task performance, activity pacing, hand placement for transfers , adherence to precautions , modification of ADLs due to precautions, safe body mechanics, and sequencing. Education provided to patient regarding orthopedic precautions, fall risk reduction, recommended ADL AE/DME, calling for nursing assist for mobility, discharge recommendations, role of OT, and OT plan of care. Patient tolerated intervention well.    Response to Interventions: Improved Safety Awareness  and Increased fatigue   Outcomes AM-PAC - Daily Activity:    Flowsheet Row Most Recent Value  AM-PAC 6-Clicks - Daily Activity  Lower Body  Clothing Activity Total Assist  Bathing A lot  Toileting Total Assist  Upper Body Clothing Activity None  Personal Grooming None  Eating Meals None  AM-PAC Total Score 16   Additional Outcome Measures: O Log: 29/30  Condition After Therapy: Condition After Therapy: Back to bed, Nursing notified of condition, Lines intact, All needs within reach, Alarm activated, Fall interventions in place  Treatment Times Time In: 1057 Time Out: 02/11/20 Time in Timed codes: 10 Total Treatment Time: 25 OT Eval Moderate Complexity minutes: 15   Treatment/Procedures Time Entry Self Care/Home Management Training minutes: 10   Charges           01/16/2024   Code Description Service Provider Modifiers Quantity  [id number] Hc Ot Self-Care/Home Mgmt Training Each [address] OT/L GO 1  [id number] Hc Ot Occupational Therapy Eval Mod Complex 45 [address], OT/L GO 1        Time of Service Note Type Status  None            Medications and Past Medical History  CURRENT MEDS: Medications Administered (24h ago, onward)     Start       01/15/24 2200  ceFAZolin  (ANCEF ) injection 2 g  Every 8 hours       Last Admin Time: 01/16/24 0556  01/15/24 1602  sterile water  irrigation solution  As needed,   Status:  Discontinued       Last Admin Time: 01/15/24 1602       01/15/24 1601  sodium chloride  (irrigation) (NS) 0.9 % irrigation solution  As needed,   Status:  Discontinued       Last Admin Time: 01/15/24 1601       01/15/24 0900  polyethylene glycol (GLYCOLAX) packet 17 g  (ADULT PHARM Bowel Management)  Daily       Last Admin Time: 01/16/24 0821       01/15/24 0900  aspirin  chewable tablet 81 mg  Daily       Last Admin Time: 01/16/24 0821       01/15/24 0900  atorvastatin  (LIPITOR) tablet 40 mg  Daily       Last Admin Time: 01/16/24 9178       01/15/24 0900  cholecalciferol (VITAMIN D3) 1,000 unit (25 mcg) tablet/capsule 1,000 Units  Daily       Last  Admin Time: 01/16/24 9177       01/15/24 0045  lactated ringer 's infusion  Continuous       Last Admin Time: 01/16/24 0043       01/15/24 0045  acetaminophen  (TYLENOL ) tablet 650 mg  (ADULT PHARM Pain Management)  Every 6 hours scheduled       Last Admin Time: 01/16/24 0556       01/15/24 0045  celecoxib (CeleBREX) capsule 200 mg  (ADULT PHARM Pain Management)  2 times daily       Last Admin Time: 01/16/24 9178       01/15/24 0045  gabapentin  (NEURONTIN ) capsule 300 mg  (ADULT PHARM Pain Management)  3 times daily       Last Admin Time: 01/16/24 0556       01/15/24 0045  enoxaparin  (LOVENOX ) syringe 40 mg  (ADULT PHARM VTE Prophylaxis)  At bedtime       Last Admin Time: 01/15/24 2144                Past Medical History: Medical History[1]  Past Surgical History: Surgical History[2]        [1] No past medical history on file. [2] Past Surgical History: Procedure Laterality Date   VAGINAL HYSTERECTOMY

## 2024-01-17 NOTE — Progress Notes (Signed)
 Case Management Discharge Note        CSN: [id number] DOB: Jun 26, 1933 Service: Orthopedics Location: R917/A  Patient Class: Inpatient  DC Disposition: : Home or Self Care  Discharge DC Disposition: : Home or Self Care  Discharge Referrals Case closed, patient/family agree with disposition plan: Yes       Case Management Coordination Status: Coordination Complete     Ginger Rory, RN

## 2024-02-29 ENCOUNTER — Ambulatory Visit (HOSPITAL_COMMUNITY)

## 2024-02-29 ENCOUNTER — Inpatient Hospital Stay

## 2024-03-09 ENCOUNTER — Ambulatory Visit: Admitting: Oncology

## 2024-03-09 ENCOUNTER — Inpatient Hospital Stay: Admitting: Oncology
